# Patient Record
Sex: Female | Born: 1990 | Race: White | Hispanic: No | Smoking: Current every day smoker
Health system: Southern US, Community
[De-identification: ages and names within clinical notes are randomized; demographics above are authoritative.]

## PROBLEM LIST (undated history)

## (undated) ENCOUNTER — Inpatient Hospital Stay (HOSPITAL_COMMUNITY): Payer: Self-pay

## (undated) ENCOUNTER — Inpatient Hospital Stay (HOSPITAL_COMMUNITY): Payer: Medicaid Other

## (undated) ENCOUNTER — Emergency Department (HOSPITAL_COMMUNITY): Admission: EM | Payer: 59 | Source: Home / Self Care

## (undated) DIAGNOSIS — R109 Unspecified abdominal pain: Secondary | ICD-10-CM

## (undated) DIAGNOSIS — G43409 Hemiplegic migraine, not intractable, without status migrainosus: Secondary | ICD-10-CM

## (undated) DIAGNOSIS — R197 Diarrhea, unspecified: Secondary | ICD-10-CM

## (undated) DIAGNOSIS — D649 Anemia, unspecified: Secondary | ICD-10-CM

## (undated) DIAGNOSIS — N200 Calculus of kidney: Secondary | ICD-10-CM

## (undated) DIAGNOSIS — R519 Headache, unspecified: Secondary | ICD-10-CM

## (undated) DIAGNOSIS — F99 Mental disorder, not otherwise specified: Secondary | ICD-10-CM

## (undated) DIAGNOSIS — O2301 Infections of kidney in pregnancy, first trimester: Secondary | ICD-10-CM

## (undated) DIAGNOSIS — R51 Headache: Secondary | ICD-10-CM

## (undated) DIAGNOSIS — F419 Anxiety disorder, unspecified: Secondary | ICD-10-CM

## (undated) DIAGNOSIS — K259 Gastric ulcer, unspecified as acute or chronic, without hemorrhage or perforation: Secondary | ICD-10-CM

## (undated) HISTORY — DX: Unspecified abdominal pain: R10.9

## (undated) HISTORY — DX: Diarrhea, unspecified: R19.7

## (undated) HISTORY — DX: Anemia, unspecified: D64.9

---
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
## 2012-02-11 ENCOUNTER — Inpatient Hospital Stay (HOSPITAL_COMMUNITY)
Admission: AD | Admit: 2012-02-11 | Discharge: 2012-02-11 | Disposition: A | Discharge status: Home / Self Care | Payer: Medicaid Other | Source: Ambulatory Visit | Attending: Obstetrics and Gynecology | Admitting: Obstetrics and Gynecology

## 2012-02-11 ENCOUNTER — Encounter (HOSPITAL_COMMUNITY): Payer: Self-pay | Admitting: *Deleted

## 2012-02-11 ENCOUNTER — Inpatient Hospital Stay (HOSPITAL_COMMUNITY): Payer: Medicaid Other

## 2012-02-11 DIAGNOSIS — N390 Urinary tract infection, site not specified: Secondary | ICD-10-CM

## 2012-02-11 DIAGNOSIS — O2390 Unspecified genitourinary tract infection in pregnancy, unspecified trimester: Secondary | ICD-10-CM

## 2012-02-11 DIAGNOSIS — O4690 Antepartum hemorrhage, unspecified, unspecified trimester: Secondary | ICD-10-CM | POA: Insufficient documentation

## 2012-02-11 DIAGNOSIS — O2340 Unspecified infection of urinary tract in pregnancy, unspecified trimester: Secondary | ICD-10-CM

## 2012-02-11 LAB — URINALYSIS, ROUTINE W REFLEX MICROSCOPIC
Bilirubin Urine: NEGATIVE
Glucose, UA: NEGATIVE mg/dL
Ketones, ur: NEGATIVE mg/dL
Nitrite: POSITIVE — AB
Protein, ur: 30 mg/dL — AB
Specific Gravity, Urine: >1.03 — ABNORMAL HIGH (ref 1.005–1.030)
Urobilinogen, UA: 0.2 mg/dL (ref 0.0–1.0)
pH: 6 (ref 5.0–8.0)

## 2012-02-11 LAB — WET PREP, GENITAL
Clue Cells Wet Prep HPF POC: NONE SEEN
Trich, Wet Prep: NONE SEEN
Yeast Wet Prep HPF POC: NONE SEEN

## 2012-02-11 LAB — URINE MICROSCOPIC-ADD ON

## 2012-02-11 MED ORDER — SULFAMETHOXAZOLE-TRIMETHOPRIM 800-160 MG PO TABS: 1.0000 | ORAL_TABLET | Freq: Two times a day (BID) | ORAL | Status: AC

## 2012-02-11 NOTE — MAU Provider Note (Signed)
History     CSN: [id number]  Arrival date and time: 02/11/12 1740   First Provider Initiated Contact with Patient 02/11/12 1821      Chief Complaint  Patient presents with  . Vaginal Bleeding   HPI patient is a G1P0 at 25.4 EGA who presents with complaint of vaginal bleeding and difficulty urinating.  States she has noticed several clots today with urination ranging in size from grain of sand to half a cm. Recently had sex. States she has had some difficulty urinating for several days.  It feels as though she has to push to pee and nothing comes out, just a dribble.  She feels as though she constantly has to pee and has pressure in her pelvis.  She denies pain or burning with urination.  She also states that she has seen floaters and had an episode of blurry vision last week.  She denies HA, shortness of breath, and chest pain.  Has not had any issues with her pregnancy.  Gets prenatal care at [facility].  OB History    Grav Para Term Preterm Abortions TAB SAB Ect Mult Living   1         0      Past Medical History  Diagnosis Date  . Asthma   . Migraine   . Anxiety   . Pregnant state, incidental   . Kidney stones   . Mental disorder     huffed inhalants "affected brain"  . Pyelonephritis complicating pregnancy, antepartum, first trimester 11/05/2011    History reviewed. No pertinent past surgical history.  Family History  Problem Relation Age of Onset  . Cancer Other   . Migraines Other     History  Substance Use Topics  . Smoking status: Current Everyday Smoker -- 1.0 packs/day for 4 years    Types: Cigarettes  . Smokeless tobacco: Never Used  . Alcohol Use: No    Allergies:  Allergies  Allergen Reactions  . Codeine Other (See Comments)    Makes drowsy  . Penicillins Other (See Comments)    Was told my parents     Prescriptions prior to admission  Medication Sig Dispense Refill  . acetaminophen (TYLENOL) 500 MG tablet Take 1,000 mg by mouth as needed. For pain       . albuterol (PROVENTIL HFA;VENTOLIN HFA) 108 (90 BASE) MCG/ACT inhaler Inhale 2 puffs into the lungs every 4 (four) hours as needed. For coughing       . Prenatal Vit-Fe Fumarate-FA (PRENATAL MULTIVITAMIN) TABS Take 1 tablet by mouth daily.      . nicotine (NICODERM CQ - DOSED IN MG/24 HOURS) 21 mg/24hr patch Place 1 patch onto the skin at bedtime.        ROS negative except per HPI Physical Exam   Blood pressure 143/79, pulse 115, temperature 98.3 F (36.8 C), temperature source Oral, resp. rate 18, last menstrual period 08/16/2011.  Physical Exam  Constitutional: She is oriented to person, place, and time. She appears well-developed and well-nourished.  HENT:  Head: Normocephalic and atraumatic.  Cardiovascular: Normal rate, regular rhythm and normal heart sounds.   Respiratory: Effort normal and breath sounds normal.  GI: Soft. There is no tenderness. There is no rebound and no guarding.       No CVA tenderness  Genitourinary:       Closed and thick  Neurological: She is alert and oriented to person, place, and time.  Skin: Skin is warm and dry.  Psychiatric: She has  a normal mood and affect.   Results for orders placed during the hospital encounter of 02/11/12 (from the past 24 hour(s))  URINALYSIS, ROUTINE W REFLEX MICROSCOPIC     Status: Abnormal   Collection Time   02/11/12  5:50 PM      Component Value Range   Color, Urine YELLOW  YELLOW   APPearance HAZY (*) CLEAR   Specific Gravity, Urine >1.030 (*) 1.005 - 1.030   pH 6.0  5.0 - 8.0   Glucose, UA NEGATIVE  NEGATIVE mg/dL   Hgb urine dipstick LARGE (*) NEGATIVE   Bilirubin Urine NEGATIVE  NEGATIVE   Ketones, ur NEGATIVE  NEGATIVE mg/dL   Protein, ur 30 (*) NEGATIVE mg/dL   Urobilinogen, UA 0.2  0.0 - 1.0 mg/dL   Nitrite POSITIVE (*) NEGATIVE   Leukocytes, UA SMALL (*) NEGATIVE  URINE MICROSCOPIC-ADD ON     Status: Abnormal   Collection Time   02/11/12  5:50 PM      Component Value Range   Squamous Epithelial /  LPF RARE  RARE   WBC, UA TOO NUMEROUS TO COUNT  <3 WBC/hpf   RBC / HPF 11-20  <3 RBC/hpf   Bacteria, UA FEW (*) RARE  WET PREP, GENITAL     Status: Abnormal   Collection Time   02/11/12  7:38 PM      Component Value Range   Yeast Wet Prep HPF POC NONE SEEN  NONE SEEN   Trich, Wet Prep NONE SEEN  NONE SEEN   Clue Cells Wet Prep HPF POC NONE SEEN  NONE SEEN   WBC, Wet Prep HPF POC FEW (*) NONE SEEN   FHT: 140, moderate, accels present, decels absent, no contractions MAU Course  Procedures  Speculum exam: no bleeding, no discharge [name]: normal placentation, breech presentation  Assessment and Plan  Patient is a G1P0 at 25.4 EGA who presents for evaluation of vaginal bleeding and difficulty peeing.  UTI: will send home with prescription for bactrim. Strainer for urine in case of stone. Bleeding likely due to UTI or recent intercourse.  Will discharge home from the MAU.   Marikay Alar 02/11/2012, 7:00 PM

## 2012-02-11 NOTE — MAU Note (Signed)
Pt states she has not been able to void, states she has to void and feels pressure, but can't and she has blood when trying to urinate.

## 2012-02-12 LAB — GC/CHLAMYDIA PROBE AMP, GENITAL
Chlamydia, DNA Probe: NEGATIVE
GC Probe Amp, Genital: NEGATIVE

## 2012-02-13 NOTE — MAU Provider Note (Signed)
I have seen and examined pt and agree with above. Blood pressure was in normal range after initial measurement, which was felt to be due to pt discomfort from difficulty voiding. Ultrasound performed showing no previa and no evidence of abruption.  Napoleon Form, MD

## 2012-03-14 ENCOUNTER — Encounter (HOSPITAL_COMMUNITY): Payer: Self-pay | Admitting: *Deleted

## 2012-03-14 ENCOUNTER — Emergency Department (HOSPITAL_COMMUNITY)
Admission: EM | Admit: 2012-03-14 | Discharge: 2012-03-15 | Disposition: A | Discharge status: Home / Self Care | Payer: Medicaid Other | Attending: Emergency Medicine | Admitting: Emergency Medicine

## 2012-03-14 DIAGNOSIS — E876 Hypokalemia: Secondary | ICD-10-CM

## 2012-03-14 DIAGNOSIS — O212 Late vomiting of pregnancy: Secondary | ICD-10-CM | POA: Insufficient documentation

## 2012-03-14 DIAGNOSIS — Z3490 Encounter for supervision of normal pregnancy, unspecified, unspecified trimester: Secondary | ICD-10-CM

## 2012-03-14 DIAGNOSIS — O99330 Smoking (tobacco) complicating pregnancy, unspecified trimester: Secondary | ICD-10-CM | POA: Insufficient documentation

## 2012-03-14 DIAGNOSIS — F410 Panic disorder [episodic paroxysmal anxiety] without agoraphobia: Secondary | ICD-10-CM | POA: Insufficient documentation

## 2012-03-14 DIAGNOSIS — D649 Anemia, unspecified: Secondary | ICD-10-CM | POA: Insufficient documentation

## 2012-03-14 DIAGNOSIS — O99891 Other specified diseases and conditions complicating pregnancy: Secondary | ICD-10-CM | POA: Insufficient documentation

## 2012-03-14 DIAGNOSIS — O99019 Anemia complicating pregnancy, unspecified trimester: Secondary | ICD-10-CM | POA: Insufficient documentation

## 2012-03-14 DIAGNOSIS — J45909 Unspecified asthma, uncomplicated: Secondary | ICD-10-CM | POA: Insufficient documentation

## 2012-03-14 DIAGNOSIS — Z880 Allergy status to penicillin: Secondary | ICD-10-CM | POA: Insufficient documentation

## 2012-03-14 DIAGNOSIS — F419 Anxiety disorder, unspecified: Secondary | ICD-10-CM

## 2012-03-14 DIAGNOSIS — Z886 Allergy status to analgesic agent status: Secondary | ICD-10-CM | POA: Insufficient documentation

## 2012-03-14 LAB — CBC
HCT: 30.7 % — ABNORMAL LOW (ref 36.0–46.0)
Hemoglobin: 10.5 g/dL — ABNORMAL LOW (ref 12.0–15.0)
MCH: 32.1 pg (ref 26.0–34.0)
MCHC: 34.2 g/dL (ref 30.0–36.0)
MCV: 93.9 fL (ref 78.0–100.0)
Platelets: 180 10*3/uL (ref 150–400)
RBC: 3.27 MIL/uL — ABNORMAL LOW (ref 3.87–5.11)
RDW: 13.7 % (ref 11.5–15.5)
WBC: 11.7 10*3/uL — ABNORMAL HIGH (ref 4.0–10.5)

## 2012-03-14 LAB — BASIC METABOLIC PANEL
BUN: 7 mg/dL (ref 6–23)
CO2: 23 mEq/L (ref 19–32)
Calcium: 8.8 mg/dL (ref 8.4–10.5)
Chloride: 104 mEq/L (ref 96–112)
Creatinine, Ser: 0.6 mg/dL (ref 0.50–1.10)
GFR calc Af Amer: >90 mL/min (ref >90)
GFR calc non Af Amer: >90 mL/min (ref >90)
Glucose, Bld: 121 mg/dL — ABNORMAL HIGH (ref 70–99)
Potassium: 3.4 mEq/L — ABNORMAL LOW (ref 3.5–5.1)
Sodium: 137 mEq/L (ref 135–145)

## 2012-03-14 NOTE — ED Notes (Addendum)
Pt reporting some visual disturbances previously tonight.  Denies any at present.  Pt does report she has a history of anxiety attacks.  Pt does appear anxious and restless.  Pt reports tingling in left hand and around mouth.  Reinforced and encouraged calming techniques.  Explained to pt that hyperventilation can cause such symptoms.  Pt denies any abdominal cramping or vaginal bleeding.  Reports uncomplicated pregnancy.  Fetal heart tones assessed via doppler.  Rate 137, regular and strong. Pt reports being on Buspar for about 3 weeks and believes that the medication may be causing her symptoms.  Encouraged pt to discuss with physician about which medications to continue.

## 2012-03-14 NOTE — ED Notes (Signed)
20 min pta, had visual disturbance, Saw half of husbands face, then began tingling, Appears to be hyperventilating. Vomiting at triage.  Appears anxious.

## 2012-03-14 NOTE — ED Notes (Signed)
Pt placed on fetal monitor.  Rapid response nurse notified and pt being observed from central monitoring.

## 2012-03-14 NOTE — ED Notes (Signed)
Pt resting more comfortably at this time.  Denies pain or tingling.  No distress noted, no anxious behavior noted.

## 2012-03-14 NOTE — ED Provider Notes (Signed)
History   This chart was scribed for Tobin Chad, MD by Toya Smothers. The patient was seen in room APA01/APA01. Patient's care was started at 2025.  CSN: [id number]  Arrival date & time 03/14/12  2025   First MD Initiated Contact with Patient 03/14/12 2224      Chief Complaint  Patient presents with  . Emesis   Patient is a 21 y.o. female presenting with vomiting and migraines. The history is provided by the patient. No language interpreter was used.  Emesis  Pertinent negatives include no abdominal pain, no chills, no cough, no diarrhea, no fever and no headaches.  Migraine This is a chronic problem. The current episode started 3 to 5 hours ago. The problem occurs rarely. The problem has been gradually improving. Associated symptoms include shortness of breath. Pertinent negatives include no chest pain, no abdominal pain and no headaches. The symptoms are aggravated by exertion. She has tried nothing for the symptoms. The treatment provided no relief.    Meagan [name] is a 21 y.o. female with a h/o panic attacks and 7 m.o. pregnant who presents to the Emergency Department complaining of 1 hours of visual disturbance  Described as " black dots floating in the corners  of her eyes." and light headedness while seated. Pt denotes associated confusion and SOB during panic attack. Pt reports that she has never experienced a panic attack as long or severe. Pt denies double vision, trouble swallowing, cough, chest pain, and dysuria.  She reports active fetal movement and denies vaginal bleeding, discharges, or leakage.  Pt lists PCP as Dr. Tyrell Antonio for prenatal care.  Past Medical History  Diagnosis Date  . Asthma   . Migraine   . Anxiety   . Pregnant state, incidental   . Mental disorder     huffed inhalants "affected brain"  . Kidney stones   . Pyelonephritis complicating pregnancy, antepartum, first trimester 11/05/2011    History reviewed. No pertinent past surgical  history.  Family History  Problem Relation Age of Onset  . Cancer Other   . Migraines Other     History  Substance Use Topics  . Smoking status: Current Every Day Smoker -- 1.0 packs/day for 4 years    Types: Cigarettes  . Smokeless tobacco: Never Used  . Alcohol Use: No    OB History    Grav Para Term Preterm Abortions TAB SAB Ect Mult Living   1         0      Review of Systems  Constitutional: Negative for fever, chills, diaphoresis, activity change, appetite change and fatigue.  HENT: Negative for hearing loss, sore throat, rhinorrhea, trouble swallowing, neck pain, voice change, sinus pressure and tinnitus.   Eyes: Positive for visual disturbance. Negative for photophobia and pain.  Respiratory: Positive for shortness of breath. Negative for cough and chest tightness.   Cardiovascular: Positive for palpitations. Negative for chest pain and leg swelling.  Gastrointestinal: Positive for vomiting. Negative for nausea, abdominal pain, diarrhea and constipation.  Genitourinary: Negative for dysuria, frequency, flank pain, vaginal bleeding, vaginal discharge, difficulty urinating and vaginal pain.  Musculoskeletal: Negative for back pain and joint swelling.  Skin: Negative for color change, pallor and rash.  Neurological: Positive for light-headedness. Negative for dizziness, tremors, seizures, syncope, facial asymmetry, speech difficulty, weakness and headaches.  Hematological: Negative.   Psychiatric/Behavioral: Positive for confusion. Negative for hallucinations. The patient is nervous/anxious.   All other systems reviewed and are negative.    Allergies  Codeine and Penicillins  Home Medications   Current Outpatient Rx  Name Route Sig Dispense Refill  . ACETAMINOPHEN 500 MG PO TABS Oral Take 1,000 mg by mouth every 6 (six) hours as needed. For pain    . ALBUTEROL SULFATE HFA 108 (90 BASE) MCG/ACT IN AERS Inhalation Inhale 2 puffs into the lungs every 4 (four) hours as  needed. For coughing     . BUSPIRONE HCL 15 MG PO TABS Oral Take 7.5 mg by mouth 2 (two) times daily.    Marland Kitchen PRENATAL MULTIVITAMIN CH Oral Take 1 tablet by mouth daily.    Marland Kitchen NICOTINE 21 MG/24HR TD PT24 Transdermal Place 1 patch onto the skin at bedtime.      BP 134/63  Pulse 76  Temp 98.3 F (36.8 C) (Oral)  Resp 18  Ht 5\' 4"  (1.626 m)  SpO2 99%  LMP 08/16/2011  Physical Exam  Nursing note and vitals reviewed. Constitutional: She is oriented to person, place, and time. She appears well-developed and well-nourished. No distress. She is not intubated.  HENT:  Head: Normocephalic and atraumatic. Head is without raccoon's eyes, without Battle's sign and without contusion. No trismus in the jaw.  Right Ear: Hearing, tympanic membrane, external ear and ear canal normal. No mastoid tenderness.  Left Ear: Hearing, tympanic membrane, external ear and ear canal normal. No mastoid tenderness.  Nose: Nose normal. No mucosal edema or rhinorrhea. No epistaxis. Right sinus exhibits no maxillary sinus tenderness and no frontal sinus tenderness. Left sinus exhibits no maxillary sinus tenderness and no frontal sinus tenderness.  Mouth/Throat: Uvula is midline and oropharynx is clear and moist. Mucous membranes are not pale, not dry and not cyanotic. Normal dentition. No uvula swelling or dental caries. No oropharyngeal exudate, posterior oropharyngeal edema, posterior oropharyngeal erythema or tonsillar abscesses.  Eyes: Conjunctivae normal, EOM and lids are normal. Pupils are equal, round, and reactive to light. Right eye exhibits no discharge. Left eye exhibits no discharge. Right conjunctiva is not injected. Right conjunctiva has no hemorrhage. Left conjunctiva is not injected. Left conjunctiva has no hemorrhage. No scleral icterus. Right eye exhibits normal extraocular motion and no nystagmus. Left eye exhibits normal extraocular motion and no nystagmus. Right pupil is round and reactive. Left pupil is round  and reactive. Pupils are equal.  Fundoscopic exam:      The right eye shows no AV nicking, no hemorrhage and no papilledema.       The left eye shows no AV nicking, no hemorrhage and no papilledema.  Neck: Normal range of motion. Neck supple. No JVD present. No tracheal deviation present. No thyromegaly present.  Cardiovascular: Normal rate, regular rhythm, normal heart sounds and intact distal pulses.  Exam reveals no gallop and no friction rub.   No murmur heard. Pulmonary/Chest: Effort normal and breath sounds normal. No accessory muscle usage or stridor. No apnea, not tachypneic and not bradypneic. She is not intubated. No respiratory distress. She has no decreased breath sounds. She has no wheezes. She has no rhonchi. She has no rales. She exhibits no mass, no tenderness, no deformity and no swelling.  Abdominal: Soft. Bowel sounds are normal. She exhibits no mass. There is no tenderness. There is no rebound and no guarding.       Gravid abd, + fetal mvmnt  Musculoskeletal: Normal range of motion. She exhibits no edema and no tenderness.  Lymphadenopathy:    She has no cervical adenopathy.  Neurological: She is alert and oriented to person, place, and  time. She has normal strength. She is not disoriented. She displays no atrophy, no tremor and normal reflexes. No cranial nerve deficit or sensory deficit. She exhibits normal muscle tone. She displays no seizure activity. Coordination and gait normal. GCS eye subscore is 4. GCS verbal subscore is 5. GCS motor subscore is 6. She displays no Babinski's sign on the right side. She displays no Babinski's sign on the left side.  Skin: Skin is warm and dry. No rash noted. She is not diaphoretic. No erythema. No pallor.  Psychiatric: Her behavior is normal. Her mood appears anxious. Her speech is not delayed, not tangential and not slurred. Cognition and memory are normal. Cognition and memory are not impaired. She is communicative. She exhibits normal  recent memory and normal remote memory.    ED Course  Procedures (including critical care time) DIAGNOSTIC STUDIES: Oxygen Saturation is 99% on room air, normal by my interpretation.    COORDINATION OF CARE: 22:53- Evaluated Pt. Pt is awake, alert, and oriented.   Labs Reviewed - No data to display No results found.   No diagnosis found.    MDM  Pt presents for evaluation of a visual disturbance associated with a panic attack.  She is 7 mo pregnant but denies any ob/gyn complaints.  Pt has no evidence of CVA or meningitis on exam.  Plan routine screening including basic labs, visual acuity, and fetal monitoring.  1610.  Pt stable, NAD.  Note mild anemia on labs.  Likely secondary to pregnancy.  Pt has no evidence of symptomatic anemia on exam.  Pt has mild hypokalemia.  Will replete with po potassium.  Plan d/c home to f/u closely with her PMD and Ob provider.  There was no noted fetal distress while under ER department care.  Pt has no focal deficits on exam.  I personally performed the services described in this documentation, which was scribed in my presence. The recorded information has been reviewed and considered.        Tobin Chad, MD 03/15/12 [phone]

## 2012-03-15 LAB — URINALYSIS, ROUTINE W REFLEX MICROSCOPIC
Bilirubin Urine: NEGATIVE
Glucose, UA: NEGATIVE mg/dL
Hgb urine dipstick: NEGATIVE
Ketones, ur: NEGATIVE mg/dL
Leukocytes, UA: NEGATIVE
Nitrite: NEGATIVE
Protein, ur: NEGATIVE mg/dL
Specific Gravity, Urine: 1.01 (ref 1.005–1.030)
Urobilinogen, UA: 0.2 mg/dL (ref 0.0–1.0)
pH: 6.5 (ref 5.0–8.0)

## 2012-03-15 MED ORDER — POTASSIUM CHLORIDE CRYS ER 20 MEQ PO TBCR
40.0000 meq | EXTENDED_RELEASE_TABLET | Freq: Once | ORAL | Status: AC
  Administered 2012-03-15: 40 meq via ORAL
  Filled 2012-03-15: qty 2

## 2012-04-03 ENCOUNTER — Inpatient Hospital Stay (HOSPITAL_COMMUNITY)
Admission: AD | Admit: 2012-04-03 | Discharge: 2012-04-03 | Disposition: A | Discharge status: Home / Self Care | Payer: Medicaid Other | Source: Ambulatory Visit | Attending: Obstetrics & Gynecology | Admitting: Obstetrics & Gynecology

## 2012-04-03 ENCOUNTER — Encounter (HOSPITAL_COMMUNITY): Payer: Self-pay | Admitting: *Deleted

## 2012-04-03 DIAGNOSIS — R109 Unspecified abdominal pain: Secondary | ICD-10-CM | POA: Insufficient documentation

## 2012-04-03 DIAGNOSIS — O479 False labor, unspecified: Secondary | ICD-10-CM

## 2012-04-03 DIAGNOSIS — O4700 False labor before 37 completed weeks of gestation, unspecified trimester: Secondary | ICD-10-CM | POA: Insufficient documentation

## 2012-04-03 DIAGNOSIS — M549 Dorsalgia, unspecified: Secondary | ICD-10-CM | POA: Insufficient documentation

## 2012-04-03 DIAGNOSIS — A5901 Trichomonal vulvovaginitis: Secondary | ICD-10-CM | POA: Insufficient documentation

## 2012-04-03 DIAGNOSIS — O98819 Other maternal infectious and parasitic diseases complicating pregnancy, unspecified trimester: Secondary | ICD-10-CM | POA: Insufficient documentation

## 2012-04-03 LAB — URINE MICROSCOPIC-ADD ON

## 2012-04-03 LAB — URINALYSIS, ROUTINE W REFLEX MICROSCOPIC
Bilirubin Urine: NEGATIVE
Bilirubin Urine: NEGATIVE
Glucose, UA: NEGATIVE mg/dL
Glucose, UA: NEGATIVE mg/dL
Hgb urine dipstick: NEGATIVE
Hgb urine dipstick: NEGATIVE
Ketones, ur: NEGATIVE mg/dL
Ketones, ur: NEGATIVE mg/dL
Nitrite: NEGATIVE
Nitrite: NEGATIVE
Protein, ur: NEGATIVE mg/dL
Protein, ur: NEGATIVE mg/dL
Specific Gravity, Urine: 1.02 (ref 1.005–1.030)
Specific Gravity, Urine: 1.01 (ref 1.005–1.030)
Urobilinogen, UA: 0.2 mg/dL (ref 0.0–1.0)
Urobilinogen, UA: 0.2 mg/dL (ref 0.0–1.0)
pH: 6.5 (ref 5.0–8.0)
pH: 6 (ref 5.0–8.0)

## 2012-04-03 LAB — WET PREP, GENITAL
Clue Cells Wet Prep HPF POC: NONE SEEN
Trich, Wet Prep: NONE SEEN
Yeast Wet Prep HPF POC: NONE SEEN

## 2012-04-03 LAB — FETAL FIBRONECTIN: Fetal Fibronectin: NEGATIVE

## 2012-04-03 NOTE — MAU Provider Note (Signed)
History     CSN: [id number]  Arrival date and time: 04/03/12 9629   First Provider Initiated Contact with Patient 04/03/12 2057      Chief Complaint  Patient presents with  . Back Pain   HPI This is a 21 y.o. female at [redacted]w[redacted]d who presents with low abdominal and back pain. Denies leaking or bleeding and reports + FM Care at [facility]  OB History    Grav Para Term Preterm Abortions TAB SAB Ect Mult Living   1         0      Past Medical History  Diagnosis Date  . Asthma   . Migraine   . Anxiety   . Pregnant state, incidental   . Mental disorder     huffed inhalants "affected brain"  . Kidney stones   . Pyelonephritis complicating pregnancy, antepartum, first trimester 11/05/2011    Past Surgical History  Procedure Date  . No past surgeries     Family History  Problem Relation Age of Onset  . Cancer Other   . Migraines Other     History  Substance Use Topics  . Smoking status: Former Smoker -- 1.0 packs/day for 4 years    Types: Cigarettes    Quit date: 12/03/2011  . Smokeless tobacco: Never Used  . Alcohol Use: No    Allergies:  Allergies  Allergen Reactions  . Codeine Other (See Comments)    Makes drowsy  . Penicillins Other (See Comments)    Was told my parents     Prescriptions prior to admission  Medication Sig Dispense Refill  . acetaminophen (TYLENOL) 500 MG tablet Take 1,000 mg by mouth every 6 (six) hours as needed. For pain      . albuterol (PROVENTIL HFA;VENTOLIN HFA) 108 (90 BASE) MCG/ACT inhaler Inhale 2 puffs into the lungs every 4 (four) hours as needed. For coughing      . ALPRAZolam (XANAX) 0.25 MG tablet Take 0.25 mg by mouth 2 (two) times daily as needed. For anxiety      . Prenatal Vit-Fe Fumarate-FA (PRENATAL MULTIVITAMIN) TABS Take 1 tablet by mouth daily.      . nicotine (NICODERM CQ - DOSED IN MG/24 HOURS) 21 mg/24hr patch Place 1 patch onto the skin at bedtime.        ROS Seen HPI  Physical Exam   Blood pressure  127/84, pulse 108, temperature 98.2 F (36.8 C), temperature source Oral, resp. rate 20, height 5\' 2"  (1.575 m), weight 173 lb (78.472 kg), last menstrual period 08/16/2011, SpO2 100.00%.  Physical Exam  Constitutional: She is oriented to person, place, and time. She appears well-developed and well-nourished. No distress.  Cardiovascular: Normal rate.   Respiratory: Effort normal.  GI: Soft. She exhibits no distension. There is no tenderness. There is no rebound and no guarding.  Genitourinary: Uterus normal. Vaginal discharge (scant white) found.       Cervix long and closed FFn collected  Musculoskeletal: Normal range of motion.  Neurological: She is alert and oriented to person, place, and time.  Skin: Skin is warm and dry.  Psychiatric: She has a normal mood and affect.   FHR reactive UCs q 2-5 min, mild  MAU Course  Procedures  MDM UA showed Trich. Pt thinks it was mixed up. Wet prep is negative for Trich. Wants to recollect Urine.  Assessment and Plan  Plan per Results Care turned over to [employer] CNM  [facility] 04/03/2012, 10:05 PM  Rev'd second UA with pt (no trich seen)- recommended pt take medication for tx but she and partner refuse.  Instructed that she may indeed have a trichomonas infection and may have complications from such in the future- still declines tx.   FHR 130s reactive & reassuring Ctx irreg, mild  Urinalysis (second sample)    Component Value Date/Time   COLORURINE YELLOW 04/03/2012 2155   APPEARANCEUR CLEAR 04/03/2012 2155   LABSPEC 1.010 04/03/2012 2155   PHURINE 6.0 04/03/2012 2155   GLUCOSEU NEGATIVE 04/03/2012 2155   HGBUR NEGATIVE 04/03/2012 2155   BILIRUBINUR NEGATIVE 04/03/2012 2155   KETONESUR NEGATIVE 04/03/2012 2155   PROTEINUR NEGATIVE 04/03/2012 2155   UROBILINOGEN 0.2 04/03/2012 2155   NITRITE NEGATIVE 04/03/2012 2155   LEUKOCYTESUR SMALL* 04/03/2012 2155    IUP at 33.0wks Braxton Hicks ctx Indeterminate trichomonas- declines  tx  D/C home with preterm labor precautions Keep next scheduled FT appt or return to [facility] sooner prn  Brooklyn Alfredo, [facility] 04/03/2012 11:15 PM

## 2012-04-03 NOTE — MAU Note (Signed)
Pt reports "really bad pain in my back"pain is mostly in left flank pain, pain in stomach that comes and goes. Denies dysuria. Denies problems with pregnancy

## 2012-04-13 ENCOUNTER — Encounter (HOSPITAL_COMMUNITY): Payer: Self-pay | Admitting: *Deleted

## 2012-04-13 ENCOUNTER — Inpatient Hospital Stay (HOSPITAL_COMMUNITY)
Admission: AD | Admit: 2012-04-13 | Discharge: 2012-04-13 | Disposition: A | Discharge status: Home / Self Care | Payer: Medicaid Other | Source: Ambulatory Visit | Attending: Obstetrics and Gynecology | Admitting: Obstetrics and Gynecology

## 2012-04-13 DIAGNOSIS — A599 Trichomoniasis, unspecified: Secondary | ICD-10-CM

## 2012-04-13 DIAGNOSIS — O4700 False labor before 37 completed weeks of gestation, unspecified trimester: Secondary | ICD-10-CM | POA: Insufficient documentation

## 2012-04-13 LAB — AMNISURE RUPTURE OF MEMBRANE (ROM) NOT AT ARMC: Amnisure ROM: NEGATIVE

## 2012-04-13 MED ORDER — METRONIDAZOLE 500 MG PO TABS: 2000.0000 mg | ORAL_TABLET | Freq: Once | ORAL | Status: DC

## 2012-04-13 NOTE — MAU Note (Signed)
Patient will be triaged in room 173. Belenda Cruise, CN notified of patient status.

## 2012-04-13 NOTE — Progress Notes (Signed)
Off FM per Ivonne Andrew CNM, Provider reviewed strip.

## 2012-04-13 NOTE — Progress Notes (Signed)
Scant fluid noted, fern obtained, NEG result

## 2012-04-13 NOTE — Progress Notes (Signed)
Pt given DC instructions and SO and PT verbalized understanding. Discussed with pt and SO previous result of trace Trich in Urine and recommendation to be treated per CNM. Possible reason for vaginal discharge. Pt refused to be treated since she  "knows she does not have an STD". Offered to have CNM/MD to come in and talk with them but patient declined.  Encouraged Pt to discuss her concerns with Dr. at next appointment 04/16/12.

## 2012-04-13 NOTE — Discharge Summary (Signed)
Patient triaged in labor and delivery. See MAU note.  [place], CNM 04/13/2012 6:12 PM

## 2012-04-13 NOTE — Progress Notes (Addendum)
Patient ID: Meagan [name], female   DOB: 1991-06-28, 21 y.o.   MRN: [id number] Chief Complaint:  Rupture of Membranes and Labor Eval  First provider contact 04/13/12 at 1700.  HPI: Meagan [name] is a 21 y.o. G2P0010 at 77w3dwho presents to maternity admissions reporting one gush of clear fluid at 1400 that ran down her leg and irregular contractions. Denies vaginal bleeding. Good fetal movement. Diagnosed with Trichomonas in maternity admissions visit 10 days ago. Patient stated she did not think she had it. She and her partner are refused treatment.  Past Medical History: Past Medical History  Diagnosis Date  . Asthma   . Migraine   . Anxiety   . Pregnant state, incidental   . Mental disorder     huffed inhalants "affected brain"  . Kidney stones   . Pyelonephritis complicating pregnancy, antepartum, first trimester 11/05/2011    Past obstetric history: OB History    Grav Para Term Preterm Abortions TAB SAB Ect Mult Living   2 0 0 0 1 0 1 0 0 0      # Outc Date GA Lbr Len/2nd Wgt Sex Del Anes PTL Lv   1 SAB            2 CUR               Past Surgical History: Past Surgical History  Procedure Date  . No past surgeries     Family History: Family History  Problem Relation Age of Onset  . Cancer Other   . Migraines Other     Social History: History  Substance Use Topics  . Smoking status: Former Smoker -- 1.0 packs/day for 4 years    Types: Cigarettes    Quit date: 12/03/2011  . Smokeless tobacco: Never Used  . Alcohol Use: No    Allergies:  Allergies  Allergen Reactions  . Codeine Nausea And Vomiting    Ataxia/vertigo  . Penicillins Other (See Comments)    Reaction as a small child, unknown    Meds:  Prescriptions prior to admission  Medication Sig Dispense Refill  . acetaminophen (TYLENOL) 500 MG tablet Take 1,000 mg by mouth every 6 (six) hours as needed. For pain      . albuterol (PROVENTIL HFA;VENTOLIN HFA) 108 (90 BASE) MCG/ACT inhaler  Inhale 2 puffs into the lungs every 4 (four) hours as needed. For coughing      . ALPRAZolam (XANAX) 0.25 MG tablet Take 0.25 mg by mouth 2 (two) times daily as needed. For anxiety      . cephALEXin (KEFLEX) 500 MG capsule Take 500 mg by mouth at bedtime. Take through delivery      . Prenatal Vit-Fe Fumarate-FA (PRENATAL MULTIVITAMIN) TABS Take 1 tablet by mouth daily.        ROS: Pertinent findings in history of present illness.  Physical Exam  Blood pressure 127/77, pulse 106, temperature 98.4 F (36.9 C), temperature source Oral, resp. rate 18, height 5\' 2"  (1.575 m), weight 80.287 kg (177 lb), last menstrual period 08/16/2011, SpO2 100.00%. GENERAL: Well-developed, well-nourished female in no acute distress.  HEENT: normocephalic HEART: normal rate RESP: normal effort ABDOMEN: Soft, non-tender, gravid appropriate for gestational age EXTREMITIES: Nontender, no edema NEURO: alert and oriented SPECULUM EXAM: NEFG, moderate amount of creamy whitish yellow discharge mixed with mucus, mildly malodorous. No blood. Negative pooling.    FHT:  Baseline 140 , moderate variability, accelerations present, no decelerations Contractions: Irregular, mild   Labs: Results for orders placed during the  hospital encounter of 04/13/12 (from the past 24 hour(s))  AMNISURE RUPTURE OF MEMBRANE (ROM)     Status: Normal   Collection Time   04/13/12  5:00 PM      Component Value Range   Amnisure ROM NEGATIVE     Fern negative x2 Imaging:  No results found. ED Course  Assessment: Intact membranes. Vaginal discharge in pregnancy, likely Trichomonas.  Plan: Discharge home Preterm labor precautions and fetal kick counts Offered treatment for Trichomonas again. Prescription given. GBS culture with sensitivities pending. Follow-up Information    Follow up with [facility]. (As scheduled)    Contact information:   [address] [place] [address] [phone]      Follow up  with [facility] [place] [facility]. (As needed if symptoms worsen)    Contact information:   [address] [id number] mc [place] [address] [phone]           Medication List     As of 04/13/2012  6:11 PM    TAKE these medications         acetaminophen 500 MG tablet   Commonly known as: TYLENOL   Take 1,000 mg by mouth every 6 (six) hours as needed. For pain      albuterol 108 (90 BASE) MCG/ACT inhaler   Commonly known as: PROVENTIL HFA;VENTOLIN HFA   Inhale 2 puffs into the lungs every 4 (four) hours as needed. For coughing      ALPRAZolam 0.25 MG tablet   Commonly known as: XANAX   Take 0.25 mg by mouth 2 (two) times daily as needed. For anxiety      cephALEXin 500 MG capsule   Commonly known as: KEFLEX   Take 500 mg by mouth at bedtime. Take through delivery      metroNIDAZOLE 500 MG tablet   Commonly known as: FLAGYL   Take 4 tablets (2,000 mg total) by mouth once.      prenatal multivitamin Tabs   Take 1 tablet by mouth daily.         Meagan [name], CNM 04/13/2012 6:00 PM

## 2012-04-13 NOTE — Discharge Summary (Signed)
Attestation of Attending Supervision of Advanced Practitioner: Evaluation and management procedures were performed by the PA/NP/CNM/OB Fellow under my supervision/collaboration. Chart reviewed and agree with management and plan.  Maikol Grassia V 04/13/2012 9:13 PM

## 2012-04-13 NOTE — MAU Note (Signed)
Patient states she had a gush of clear fluid at 1400 that ran down her leg. Only happened once and is not wearing a pad. Reports irregular contractions. Reports good fetal movement.

## 2012-04-13 NOTE — Progress Notes (Signed)
Spoke with [name] CNM, will do fern

## 2012-04-16 LAB — CULTURE, BETA STREP (GROUP B ONLY)

## 2012-04-27 ENCOUNTER — Inpatient Hospital Stay (HOSPITAL_COMMUNITY)
Admission: AD | Admit: 2012-04-27 | Discharge: 2012-04-27 | Disposition: A | Discharge status: Home / Self Care | Payer: Medicaid Other | Source: Ambulatory Visit | Attending: Obstetrics & Gynecology | Admitting: Obstetrics & Gynecology

## 2012-04-27 ENCOUNTER — Encounter (HOSPITAL_COMMUNITY): Payer: Self-pay | Admitting: *Deleted

## 2012-04-27 DIAGNOSIS — O2300 Infections of kidney in pregnancy, unspecified trimester: Secondary | ICD-10-CM

## 2012-04-27 DIAGNOSIS — O99891 Other specified diseases and conditions complicating pregnancy: Secondary | ICD-10-CM | POA: Insufficient documentation

## 2012-04-27 DIAGNOSIS — R1012 Left upper quadrant pain: Secondary | ICD-10-CM | POA: Insufficient documentation

## 2012-04-27 DIAGNOSIS — O210 Mild hyperemesis gravidarum: Secondary | ICD-10-CM | POA: Insufficient documentation

## 2012-04-27 LAB — URINALYSIS, ROUTINE W REFLEX MICROSCOPIC
Bilirubin Urine: NEGATIVE
Glucose, UA: NEGATIVE mg/dL
Hgb urine dipstick: NEGATIVE
Ketones, ur: NEGATIVE mg/dL
Nitrite: NEGATIVE
Protein, ur: NEGATIVE mg/dL
Specific Gravity, Urine: 1.02 (ref 1.005–1.030)
Urobilinogen, UA: 1 mg/dL (ref 0.0–1.0)
pH: 6.5 (ref 5.0–8.0)

## 2012-04-27 LAB — URINE MICROSCOPIC-ADD ON

## 2012-04-27 MED ORDER — LACTATED RINGERS IV BOLUS (SEPSIS): 1000.0000 mL | Freq: Once | INTRAVENOUS | Status: DC

## 2012-04-27 MED ORDER — HYDROXYZINE HCL 50 MG PO TABS
50.0000 mg | ORAL_TABLET | Freq: Once | ORAL | Status: DC
  Filled 2012-04-27: qty 1

## 2012-04-27 MED ORDER — ONDANSETRON HCL 4 MG PO TABS: 4.0000 mg | ORAL_TABLET | Freq: Once | ORAL | Status: DC

## 2012-04-27 MED ORDER — HYDROXYZINE HCL 50 MG/ML IM SOLN: 50.0000 mg | Freq: Four times a day (QID) | INTRAMUSCULAR | Status: DC | PRN

## 2012-04-27 NOTE — MAU Note (Signed)
Dr. Claiborne Billings in pt room discussing treatment. Pt still refuses and states she is ready to go home.

## 2012-04-27 NOTE — MAU Note (Signed)
Pt refused to have IV started or to have labs drawn. Dr. Claiborne Billings talked to pt about the consequences of refusing treatment. Pt verbalized understanding.

## 2012-04-27 NOTE — MAU Note (Signed)
Pt presents for abdominal pain/cramping that started this morning accompanied by nausea.  Denies any LOF or bleeding.  Reports good fetal movement.

## 2012-04-27 NOTE — MAU Provider Note (Signed)
History     CSN: [id number]  Arrival date and time: 04/27/12 2021   Chief Complaint  Patient presents with  . Abdominal Cramping  . Nausea   HPI Meagan [name] 21 y.o. G1P0000 [redacted]w[redacted]d presented to the MAU this evening for abdominal pain for 1 hour. She describes the pain more as cramping in her lower abdomen with occasional LUQ pain. She has a history of pyelonephritis during her pregnancy and is on keflex through delivery. She has a history of panic attacks and currently is starting to experience one since she has arrived. She denies vomit, diarrhea, headache, rash or visual changes.   Past Medical History  Diagnosis Date  . Asthma   . Migraine   . Anxiety   . Pregnant state, incidental   . Mental disorder     huffed inhalants "affected brain"  . Kidney stones   . Pyelonephritis complicating pregnancy, antepartum, first trimester 11/05/2011    Past Surgical History  Procedure Date  . No past surgeries     Family History  Problem Relation Age of Onset  . Cancer Other   . Migraines Other     History  Substance Use Topics  . Smoking status: Former Smoker -- 1.0 packs/day for 4 years    Types: Cigarettes    Quit date: 12/03/2011  . Smokeless tobacco: Never Used  . Alcohol Use: No    Allergies:  Allergies  Allergen Reactions  . Codeine Nausea And Vomiting    Ataxia/vertigo  . Penicillins Other (See Comments)    Reaction as a small child, unknown    Prescriptions prior to admission  Medication Sig Dispense Refill  . acetaminophen (TYLENOL) 500 MG tablet Take 1,000 mg by mouth every 6 (six) hours as needed. For pain      . albuterol (PROVENTIL HFA;VENTOLIN HFA) 108 (90 BASE) MCG/ACT inhaler Inhale 2 puffs into the lungs every 4 (four) hours as needed. For coughing      . ALPRAZolam (XANAX) 0.25 MG tablet Take 0.125-0.25 mg by mouth 2 (two) times daily as needed. For anxiety      . cephALEXin (KEFLEX) 500 MG capsule Take 500 mg by mouth at bedtime. Take  through delivery      . Prenatal Vit-Fe Fumarate-FA (PRENATAL MULTIVITAMIN) TABS Take 1 tablet by mouth daily.      Marland Kitchen DISCONTD: metroNIDAZOLE (FLAGYL) 500 MG tablet Take 4 tablets (2,000 mg total) by mouth once.  4 tablet  0    Review of Systems  Constitutional: Negative for fever and chills.  Eyes: Negative for blurred vision and double vision.  Respiratory: Positive for shortness of breath. Negative for cough.        Anxiety caused SOB  Cardiovascular: Negative for chest pain and palpitations.  Gastrointestinal: Positive for nausea and abdominal pain. Negative for vomiting, diarrhea and constipation.  Genitourinary: Negative for dysuria, urgency, frequency, hematuria and flank pain.  Musculoskeletal: Negative for back pain.  Neurological: Negative for dizziness, weakness and headaches.  Psychiatric/Behavioral: The patient is nervous/anxious.    Physical Exam   Blood pressure 114/60, pulse 101, temperature 98.3 F (36.8 C), temperature source Oral, resp. rate 20, height 5\' 2"  (1.575 m), weight 79.833 kg (176 lb), last menstrual period 08/16/2011.  Physical Exam  Constitutional: She is oriented to person, place, and time. She appears well-developed and well-nourished. No distress.  HENT:  Head: Normocephalic.  Eyes: Conjunctivae normal are normal. Pupils are equal, round, and reactive to light. Right eye exhibits no discharge. Left eye  exhibits no discharge. No scleral icterus.  Cardiovascular: Normal rate, regular rhythm and normal heart sounds.   No murmur heard. Respiratory: Effort normal and breath sounds normal. No respiratory distress. She has no wheezes. She has no rales.  GI: Soft. Bowel sounds are normal. She exhibits no distension and no mass. There is no tenderness. There is no rebound and no guarding.       Displayed no tenderness with palpation Gravid   Musculoskeletal: She exhibits no edema and no tenderness.       No CVA tenderness  Neurological: She is alert and  oriented to person, place, and time.  Skin: Skin is warm and dry. She is not diaphoretic.  Psychiatric: Her mood appears anxious. She expresses inappropriate judgment.       Anxious, Psychomotor agitation. Flushed.    Dilation: Closed Effacement (%): Thick Cervical Position: Posterior Station: -3 Exam by:: Dr. Claiborne Billings  Re-exam of cervix unchanged EFM: FHR 140's baseline. Cat 2 tracing d/t frequent areas of marked variability accelerations present, no decelerations present. Contractions irregular 4-73m. MAU Course  Procedures 1. EFM 2. UA  Assessment and Plan  1. Anxiety: Vistaril offered --> refused 2. Abdominal Pain:        -Probable uterine irritibility, possible kidney/bladder infection or infection of other etiology--> refused         all blood work      - refused IV bolus for possible dehydration causes 3. Nausea: Zofran offered --> refused 4. Discharge home:  - Spoke with the patient in depth about the consequences of her not letting intervention or treatment take place. Informed her ultimately it is her choice, but her current choice is not recommended. She was informed she could be putting herself and her baby in harm if she would have an infection or was going into labor. She stated she understood. She and her partner were offered the chance for any questions, neither of them had questions and explained they understood. Explained her baby HR is what we refer to as "marked variability" and it is normally something we would like to correct or observe. She agreed to have her cervix checked a 2nd time for possible changes, but no other intervention. Documented by nurse as well.  - home with labor precautions, take all her medications and encourage PO fluids  Kuneff, Renee 04/27/2012, 9:10 PM

## 2012-04-29 LAB — OB RESULTS CONSOLE GBS: GBS: NEGATIVE

## 2012-05-14 ENCOUNTER — Encounter (HOSPITAL_COMMUNITY): Payer: Self-pay | Admitting: *Deleted

## 2012-05-14 ENCOUNTER — Inpatient Hospital Stay (HOSPITAL_COMMUNITY)
Admission: AD | Admit: 2012-05-14 | Discharge: 2012-05-14 | Disposition: A | Discharge status: Home / Self Care | Payer: Medicaid Other | Source: Ambulatory Visit | Attending: Obstetrics and Gynecology | Admitting: Obstetrics and Gynecology

## 2012-05-14 ENCOUNTER — Inpatient Hospital Stay (HOSPITAL_COMMUNITY): Payer: Medicaid Other

## 2012-05-14 DIAGNOSIS — R4182 Altered mental status, unspecified: Secondary | ICD-10-CM | POA: Insufficient documentation

## 2012-05-14 DIAGNOSIS — R5383 Other fatigue: Secondary | ICD-10-CM | POA: Insufficient documentation

## 2012-05-14 DIAGNOSIS — R5381 Other malaise: Secondary | ICD-10-CM | POA: Insufficient documentation

## 2012-05-14 DIAGNOSIS — F449 Dissociative and conversion disorder, unspecified: Secondary | ICD-10-CM

## 2012-05-14 DIAGNOSIS — R209 Unspecified disturbances of skin sensation: Secondary | ICD-10-CM | POA: Insufficient documentation

## 2012-05-14 DIAGNOSIS — O99891 Other specified diseases and conditions complicating pregnancy: Secondary | ICD-10-CM | POA: Insufficient documentation

## 2012-05-14 LAB — URINALYSIS, ROUTINE W REFLEX MICROSCOPIC
Bilirubin Urine: NEGATIVE
Glucose, UA: NEGATIVE mg/dL
Ketones, ur: NEGATIVE mg/dL
Nitrite: NEGATIVE
Protein, ur: NEGATIVE mg/dL
Specific Gravity, Urine: 1.02 (ref 1.005–1.030)
Urobilinogen, UA: 1 mg/dL (ref 0.0–1.0)
pH: 7 (ref 5.0–8.0)

## 2012-05-14 LAB — URINE MICROSCOPIC-ADD ON

## 2012-05-14 LAB — CBC
HCT: 34 % — ABNORMAL LOW (ref 36.0–46.0)
Hemoglobin: 11.3 g/dL — ABNORMAL LOW (ref 12.0–15.0)
MCH: 31 pg (ref 26.0–34.0)
MCHC: 33.2 g/dL (ref 30.0–36.0)
MCV: 93.4 fL (ref 78.0–100.0)
Platelets: 169 10*3/uL (ref 150–400)
RBC: 3.64 MIL/uL — ABNORMAL LOW (ref 3.87–5.11)
RDW: 13.2 % (ref 11.5–15.5)
WBC: 9.8 10*3/uL (ref 4.0–10.5)

## 2012-05-14 LAB — COMPREHENSIVE METABOLIC PANEL
ALT: 10 U/L (ref 0–35)
AST: 8 U/L (ref 0–37)
Albumin: 2.3 g/dL — ABNORMAL LOW (ref 3.5–5.2)
Alkaline Phosphatase: 114 U/L (ref 39–117)
BUN: 8 mg/dL (ref 6–23)
CO2: 23 mEq/L (ref 19–32)
Calcium: 8.7 mg/dL (ref 8.4–10.5)
Chloride: 104 mEq/L (ref 96–112)
Creatinine, Ser: 0.62 mg/dL (ref 0.50–1.10)
GFR calc Af Amer: >90 mL/min (ref >90)
GFR calc non Af Amer: >90 mL/min (ref >90)
Glucose, Bld: 77 mg/dL (ref 70–99)
Potassium: 3.9 mEq/L (ref 3.5–5.1)
Sodium: 137 mEq/L (ref 135–145)
Total Bilirubin: 0.2 mg/dL — ABNORMAL LOW (ref 0.3–1.2)
Total Protein: 5.3 g/dL — ABNORMAL LOW (ref 6.0–8.3)

## 2012-05-14 NOTE — MAU Provider Note (Signed)
History     CSN: [id number]  Arrival date and time: 05/14/12 1249   First Provider Initiated Contact with Patient 05/14/12 1436      Chief Complaint  Patient presents with  . Numbness  . Altered Mental Status   HPI Pt is 21 yo G1 at [redacted]w[redacted]d presenting to MAU to be evaluated for right sided weakness. Patient states last night she states her tongue felt like "it wouldn't move" and she had difficulty speaking around 4:00am. She then noticed she was having numbness on the entire right side of her body (face, arm and leg). She went to bed, but when she got up this morning, she noticed weakness on her right side. Sensation had returned with no focal sensory deficit. Patient reports some confusion; difficulty remember things or forgets what she is saying in the middle of her sentence for the last week. Reports never having this before. She is a patient at [facility]. She states she has had some elevated BP in the office, but not treated for PIH. Patient denies any drug or alcohol use. She has a history of panic attacks and takes Xanax but does not report any anxiety associated with event.  Good fetal movement. No bleeding, no gush of fluid. No regular contractions. She does have some cramping sensation and back pain.   OB History    Grav Para Term Preterm Abortions TAB SAB Ect Mult Living   1 0 0 0 0 0 0 0 0 0       Past Medical History  Diagnosis Date  . Asthma   . Migraine   . Anxiety   . Pregnant state, incidental   . Mental disorder     huffed inhalants "affected brain"  . Kidney stones   . Pyelonephritis complicating pregnancy, antepartum, first trimester 11/05/2011    Past Surgical History  Procedure Date  . No past surgeries     Family History  Problem Relation Age of Onset  . Cancer Other   . Migraines Other     History  Substance Use Topics  . Smoking status: Former Smoker -- 1.0 packs/day for 4 years    Types: Cigarettes    Quit date: 12/03/2011  . Smokeless  tobacco: Never Used  . Alcohol Use: No    Allergies:  Allergies  Allergen Reactions  . Codeine Nausea And Vomiting    Ataxia/vertigo  . Penicillins Other (See Comments)    Reaction as a small child, unknown    Prescriptions prior to admission  Medication Sig Dispense Refill  . albuterol (PROVENTIL HFA;VENTOLIN HFA) 108 (90 BASE) MCG/ACT inhaler Inhale 2 puffs into the lungs every 4 (four) hours as needed. For coughing      . ALPRAZolam (XANAX) 0.5 MG tablet Take 0.5 mg by mouth 2 (two) times daily as needed. anxiety      . cephALEXin (KEFLEX) 500 MG capsule Take 500 mg by mouth at bedtime. Take through delivery      . Prenatal Vit-Fe Fumarate-FA (PRENATAL MULTIVITAMIN) TABS Take 1 tablet by mouth daily.        Review of Systems  Constitutional: Negative for fever and chills.  Respiratory: Negative for shortness of breath.   Cardiovascular: Negative for chest pain.  Gastrointestinal: Negative for nausea and vomiting.  Genitourinary: Negative for dysuria.  Musculoskeletal: Positive for back pain.  Skin: Negative for rash.  Neurological: Positive for focal weakness. Negative for dizziness, tingling, tremors, sensory change, seizures and headaches.   Physical Exam  Blood pressure 124/67, pulse 104, temperature 97.6 F (36.4 C), temperature source Oral, resp. rate 16, height 5\' 2"  (1.575 m), weight 79.833 kg (176 lb), last menstrual period 08/16/2011.  Physical Exam  Constitutional: She is oriented to person, place, and time. She appears well-developed and well-nourished. No distress.  HENT:  Head: Normocephalic and atraumatic.  Neck: Normal range of motion.  Cardiovascular: Normal rate and regular rhythm.   No murmur heard. Respiratory: Effort normal and breath sounds normal. She has no wheezes.  GI: Soft. There is no tenderness.       Gravid. Toco in place.  Musculoskeletal: Normal range of motion. She exhibits no edema and no tenderness.  Lymphadenopathy:    She has no  cervical adenopathy.  Neurological: She is alert and oriented to person, place, and time. She is not disoriented. No cranial nerve deficit or sensory deficit. Gait normal.       RUE 4/5 strength, LUE 5/5 (obvious difference) RLE 5/5 evaluated in bed, LLE 5/5  Skin: Skin is dry. No rash noted.    MAU Course  Procedures Results for orders placed during the hospital encounter of 05/14/12 (from the past 24 hour(s))  URINALYSIS, ROUTINE W REFLEX MICROSCOPIC     Status: Abnormal   Collection Time   05/14/12  1:05 PM      Component Value Range   Color, Urine YELLOW  YELLOW   APPearance HAZY (*) CLEAR   Specific Gravity, Urine 1.020  1.005 - 1.030   pH 7.0  5.0 - 8.0   Glucose, UA NEGATIVE  NEGATIVE mg/dL   Hgb urine dipstick TRACE (*) NEGATIVE   Bilirubin Urine NEGATIVE  NEGATIVE   Ketones, ur NEGATIVE  NEGATIVE mg/dL   Protein, ur NEGATIVE  NEGATIVE mg/dL   Urobilinogen, UA 1.0  0.0 - 1.0 mg/dL   Nitrite NEGATIVE  NEGATIVE   Leukocytes, UA MODERATE (*) NEGATIVE  URINE MICROSCOPIC-ADD ON     Status: Abnormal   Collection Time   05/14/12  1:05 PM      Component Value Range   Squamous Epithelial / LPF FEW (*) RARE   WBC, UA 11-20  <3 WBC/hpf   RBC / HPF 3-6  <3 RBC/hpf   Bacteria, UA MANY (*) RARE   FHT baseline 145, moderate variability, reactive Contractions noted every 4-7 minutes  MDM 2:50- Patient seen and examined. Noticeable weakness RUE. Cranial nerves appear intact, and no speech deficit appreciated. Since patient reports tongue, face, arm and leg involvement and acute onset, will get Head CT without contrast to evaluate for CVA or other cause. Will also check cbc for anemia and CMet for electrolytes. BP stable, no protein in urine. Continue to monitor. Discussed with Sharen Counter, CNM and Dr. Jolayne Panther who agree with plan.  4:30- Head CT reviewed; negative. CBC and Cmet unremarkable. Discussed with Dr. Jolayne Panther and Sharen Counter, CNM   Assessment and Plan  21  yo G1 at [redacted]w[redacted]d presenting with acute onset right sided weakness  - Head CT and labs within normal limits. Most likely conversion disorder and/or anxiety related - Will follow up with [facility] later this week - Given labor precautions that should prompt her return - Discussed with Dr. Despina Hidden prior to discharge   HAIRFORD, AMBER 05/14/2012, 2:38 PM   I have seen this patient and agree with the above resident's note.  LEFTWICH-KIRBY, Natsuko Kelsay Certified Nurse-Midwife

## 2012-05-14 NOTE — MAU Note (Signed)
C/o feeling numbness on the R side and felt like she was swallowing her tongue around 0400 this AM; c/o feeling confused since 0400;

## 2012-05-15 ENCOUNTER — Encounter (HOSPITAL_COMMUNITY): Payer: Self-pay | Admitting: *Deleted

## 2012-05-15 ENCOUNTER — Inpatient Hospital Stay (HOSPITAL_COMMUNITY)
Admission: AD | Admit: 2012-05-15 | Discharge: 2012-05-15 | Disposition: A | Discharge status: Home / Self Care | Payer: Medicaid Other | Source: Ambulatory Visit | Attending: Family Medicine | Admitting: Family Medicine

## 2012-05-15 DIAGNOSIS — O99891 Other specified diseases and conditions complicating pregnancy: Secondary | ICD-10-CM | POA: Insufficient documentation

## 2012-05-15 DIAGNOSIS — G43109 Migraine with aura, not intractable, without status migrainosus: Secondary | ICD-10-CM

## 2012-05-15 DIAGNOSIS — H538 Other visual disturbances: Secondary | ICD-10-CM | POA: Insufficient documentation

## 2012-05-15 DIAGNOSIS — R030 Elevated blood-pressure reading, without diagnosis of hypertension: Secondary | ICD-10-CM | POA: Insufficient documentation

## 2012-05-15 DIAGNOSIS — Z331 Pregnant state, incidental: Secondary | ICD-10-CM

## 2012-05-15 DIAGNOSIS — G43909 Migraine, unspecified, not intractable, without status migrainosus: Secondary | ICD-10-CM | POA: Insufficient documentation

## 2012-05-15 LAB — PROTEIN / CREATININE RATIO, URINE
Creatinine, Urine: 48.12 mg/dL
Protein Creatinine Ratio: 0.11 (ref 0.00–0.15)
Total Protein, Urine: 5.4 mg/dL

## 2012-05-15 LAB — URINALYSIS, ROUTINE W REFLEX MICROSCOPIC
Bilirubin Urine: NEGATIVE
Glucose, UA: NEGATIVE mg/dL
Ketones, ur: NEGATIVE mg/dL
Leukocytes, UA: NEGATIVE
Nitrite: NEGATIVE
Protein, ur: NEGATIVE mg/dL
Specific Gravity, Urine: <1.005 — ABNORMAL LOW (ref 1.005–1.030)
Urobilinogen, UA: 0.2 mg/dL (ref 0.0–1.0)
pH: 6.5 (ref 5.0–8.0)

## 2012-05-15 LAB — URINE MICROSCOPIC-ADD ON

## 2012-05-15 MED ORDER — SODIUM CHLORIDE 0.9 % IV SOLN
25.0000 mg | Freq: Once | INTRAVENOUS | Status: DC
  Filled 2012-05-15: qty 1

## 2012-05-15 MED ORDER — DEXAMETHASONE SODIUM PHOSPHATE 10 MG/ML IJ SOLN
10.0000 mg | Freq: Once | INTRAMUSCULAR | Status: AC
  Administered 2012-05-15: 10 mg via INTRAVENOUS
  Filled 2012-05-15: qty 1

## 2012-05-15 MED ORDER — LACTATED RINGERS IV BOLUS (SEPSIS)
1000.0000 mL | Freq: Once | INTRAVENOUS | Status: AC
  Administered 2012-05-15: 1000 mL via INTRAVENOUS

## 2012-05-15 MED ORDER — PROMETHAZINE HCL 25 MG/ML IJ SOLN
12.5000 mg | Freq: Once | INTRAMUSCULAR | Status: AC
  Administered 2012-05-15: 12.5 mg via INTRAVENOUS
  Filled 2012-05-15: qty 1

## 2012-05-15 MED ORDER — BUTALBITAL-APAP-CAFFEINE 50-325-40 MG PO TABS
2.0000 | ORAL_TABLET | Freq: Once | ORAL | Status: AC
  Administered 2012-05-15: 2 via ORAL
  Filled 2012-05-15: qty 2

## 2012-05-15 MED ORDER — DIPHENHYDRAMINE HCL 50 MG/ML IJ SOLN
25.0000 mg | Freq: Once | INTRAMUSCULAR | Status: AC
  Administered 2012-05-15: 25 mg via INTRAVENOUS
  Filled 2012-05-15: qty 1

## 2012-05-15 MED ORDER — ISOMETHEPTENE-APAP-DICHLORAL 65-325-100 MG PO CAPS: 1.0000 | ORAL_CAPSULE | ORAL | Status: DC | PRN

## 2012-05-15 NOTE — MAU Provider Note (Signed)
Chief Complaint:  Headache, Labor Eval and Hypertension   First Provider Initiated Contact with Patient 05/15/12 1817     HPI: Meagan [name] is a 21 y.o. G1P0000 at [redacted]w[redacted]d who presents to maternity admissions by EMS reporting: 1. Blurry vision R>L this afternoon 2. Left-sided headache since yesterday, 7/10 on pain scale. Constant, dull.  3. Mild-Moderate contractions every 2-3 min 4. Elevated BP 160/100's in the ambulance 5. Weakness in left lower arm x 15 minutes while in ambulance  Denies contractions, leakage of fluid, vaginal bleeding, difficulty w/ speech or gait or epigastric pain. Good fetal movement. Was seen in MAU yesterday w/ same Sx, neg head CT and normal PIH labs. Has Hx of Migraines, but none in pregnancy and has had three prior episodes of similar vision changes earlier this pregnancy that were not worked up. Usually takes ibuprofen w/ good relief, but cannot take at this gestation. She has Hx of anxiety for which she takes Xanax.   Pregnancy Course:   Past Medical History: Past Medical History  Diagnosis Date  . Asthma   . Migraine   . Anxiety   . Pregnant state, incidental   . Mental disorder     huffed inhalants "affected brain"  . Kidney stones   . Pyelonephritis complicating pregnancy, antepartum, first trimester 11/05/2011    Past obstetric history: OB History    Grav Para Term Preterm Abortions TAB SAB Ect Mult Living   1 0 0 0 0 0 0 0 0 0      # Outc Date GA Lbr Len/2nd Wgt Sex Del Anes PTL Lv   1 CUR               Past Surgical History: Past Surgical History  Procedure Date  . No past surgeries     Family History: Family History  Problem Relation Age of Onset  . Cancer Other   . Migraines Other     Social History: History  Substance Use Topics  . Smoking status: Former Smoker -- 1.0 packs/day for 4 years    Types: Cigarettes    Quit date: 12/03/2011  . Smokeless tobacco: Never Used  . Alcohol Use: No    Allergies:  Allergies    Allergen Reactions  . Codeine Nausea And Vomiting    Ataxia/vertigo  . Penicillins Other (See Comments)    Reaction as a small child, unknown    Meds:  Prescriptions prior to admission  Medication Sig Dispense Refill  . albuterol (PROVENTIL HFA;VENTOLIN HFA) 108 (90 BASE) MCG/ACT inhaler Inhale 2 puffs into the lungs every 4 (four) hours as needed. For coughing      . ALPRAZolam (XANAX) 0.5 MG tablet Take 0.5 mg by mouth 2 (two) times daily as needed. anxiety      . cephALEXin (KEFLEX) 500 MG capsule Take 500 mg by mouth at bedtime. Take through delivery      . Prenatal Vit-Fe Fumarate-FA (PRENATAL MULTIVITAMIN) TABS Take 1 tablet by mouth daily.        ROS: Pertinent findings in history of present illness.  Physical Exam  Blood pressure 135/71, pulse 107, temperature 97.1 F (36.2 C), temperature source Oral, resp. rate 20, height 5\' 2"  (1.575 m), weight 79.833 kg (176 lb), last menstrual period 08/16/2011, SpO2 99.00%. GENERAL: Well-developed, well-nourished female in no acute distress.  HEENT: normocephalic, PERRLA, no scleral icterus  HEART: Tachycardic, initially 120's, then 100's. Regular rhythm. No murmurs, rubs or gallops. RESP: normal effort ABDOMEN: Soft, non-tender, gravid appropriate for gestational  age EXTREMITIES: Nontender, no edema, DTRs 2+, no clonus NEURO: Alert and oriented x 4. Strength and sensation equal bilat in upper and lower extremities. Facial movements symmetrical.  SPECULUM EXAM: deferred Dilation: 1 Effacement (%): Thick Cervical Position: Posterior Exam by:: L. Paschal, RN  FHT:  Baseline 130-140 , moderate variability, accelerations present, no decelerations Contractions: q 2-5 mins, mild   Labs: Results for orders placed during the hospital encounter of 05/15/12 (from the past 24 hour(s))  URINALYSIS, ROUTINE W REFLEX MICROSCOPIC     Status: Abnormal   Collection Time   05/15/12 12:35 PM      Component Value Range   Color, Urine YELLOW   YELLOW   APPearance CLEAR  CLEAR   Specific Gravity, Urine <1.005 (*) 1.005 - 1.030   pH 6.5  5.0 - 8.0   Glucose, UA NEGATIVE  NEGATIVE mg/dL   Hgb urine dipstick MODERATE (*) NEGATIVE   Bilirubin Urine NEGATIVE  NEGATIVE   Ketones, ur NEGATIVE  NEGATIVE mg/dL   Protein, ur NEGATIVE  NEGATIVE mg/dL   Urobilinogen, UA 0.2  0.0 - 1.0 mg/dL   Nitrite NEGATIVE  NEGATIVE   Leukocytes, UA NEGATIVE  NEGATIVE  URINE MICROSCOPIC-ADD ON     Status: Normal   Collection Time   05/15/12 12:35 PM      Component Value Range   Squamous Epithelial / LPF RARE  RARE   WBC, UA 0-2  <3 WBC/hpf   RBC / HPF 0-2  <3 RBC/hpf   Bacteria, UA RARE  RARE  PROTEIN / CREATININE RATIO, URINE     Status: Normal   Collection Time   05/15/12 12:35 PM      Component Value Range   Creatinine, Urine 48.12     Total Protein, Urine 5.4     PROTEIN CREATININE RATIO 0.11  0.00 - 0.15   Imaging:   MAU Course: 1500: Minimal relief of HA w/ Fioricet. Will give IV fluids and Migraine cocktail Decadron, Benadryl and phenergan. Discussed Sx, work-up yesterday and exam w/ Dr. Shawnie Pons. No other labs or imaging needed. May D/C if HA resolves w/ cocktail.  1800: HA resolved No vision changes or numbness while in MAU.   Assessment: 1. Migraine with aura   2. Pregnancy, incidental    Plan: Discharge home Labor precautions and fetal kick counts Comfort measures for Migraine. PIH precautions.    Medication List     As of 05/15/2012  6:47 PM    TAKE these medications         albuterol 108 (90 BASE) MCG/ACT inhaler   Commonly known as: PROVENTIL HFA;VENTOLIN HFA   Inhale 2 puffs into the lungs every 4 (four) hours as needed. For coughing      ALPRAZolam 0.5 MG tablet   Commonly known as: XANAX   Take 0.5 mg by mouth 2 (two) times daily as needed. anxiety      cephALEXin 500 MG capsule   Commonly known as: KEFLEX   Take 500 mg by mouth at bedtime. Take through delivery       isometheptene-acetaminophen-dichloralphenazone 65-325-100 MG capsule   Commonly known as: MIDRIN   Take 1-2 capsules by mouth every 4 (four) hours as needed.      prenatal multivitamin Tabs   Take 1 tablet by mouth daily.        [place], CNM 05/15/2012 2:20 PM

## 2012-05-15 NOTE — Progress Notes (Signed)
CNM informed pt states her HA is gone & she wants to eat.  OK for pt to have meal, CNM states she is coming to see pt.

## 2012-05-15 NOTE — MAU Note (Signed)
Pt states she was as [employer] vision blackened and has had a headache since EMS arrived.  Pt reports U/C's are every 2-3 min.  No vaginal bleeding or ROM.  Good fetal movement.

## 2012-05-16 LAB — URINE CULTURE: Colony Count: 9000

## 2012-05-16 NOTE — MAU Provider Note (Signed)
Chart reviewed and agree with management and plan.  

## 2012-05-25 ENCOUNTER — Encounter (HOSPITAL_COMMUNITY): Payer: Self-pay | Admitting: Obstetrics and Gynecology

## 2012-05-25 ENCOUNTER — Inpatient Hospital Stay (HOSPITAL_COMMUNITY)
Admission: AD | Admit: 2012-05-25 | Discharge: 2012-05-25 | Disposition: A | Discharge status: Home / Self Care | Payer: Medicaid Other | Source: Ambulatory Visit | Attending: Obstetrics & Gynecology | Admitting: Obstetrics & Gynecology

## 2012-05-25 DIAGNOSIS — O479 False labor, unspecified: Secondary | ICD-10-CM | POA: Insufficient documentation

## 2012-05-25 DIAGNOSIS — N898 Other specified noninflammatory disorders of vagina: Secondary | ICD-10-CM

## 2012-05-25 DIAGNOSIS — N949 Unspecified condition associated with female genital organs and menstrual cycle: Secondary | ICD-10-CM | POA: Insufficient documentation

## 2012-05-25 LAB — WET PREP, GENITAL
Clue Cells Wet Prep HPF POC: NONE SEEN
Trich, Wet Prep: NONE SEEN
Yeast Wet Prep HPF POC: NONE SEEN

## 2012-05-25 NOTE — MAU Provider Note (Signed)
Chief Complaint:  Rupture of Membranes  HPI: Meagan [name] is a 21 y.o. G1P0000 at [redacted]w[redacted]d who presents to maternity admissions reporting vaginal discharge since 2:00am this morning. Described as pinkish -yellowish. Last sexual intercourse: last night. Denies regular painful contractions, leakage of fluid or vaginal bleeding. Good fetal movement.   Pregnancy Course:   Past Medical History: Past Medical History  Diagnosis Date  . Asthma   . Migraine   . Anxiety   . Pregnant state, incidental   . Mental disorder     huffed inhalants "affected brain"  . Kidney stones   . Pyelonephritis complicating pregnancy, antepartum, first trimester 11/05/2011    Past obstetric history: OB History    Grav Para Term Preterm Abortions TAB SAB Ect Mult Living   1 0 0 0 0 0 0 0 0 0       Past Surgical History: Past Surgical History  Procedure Date  . No past surgeries     Family History: Family History  Problem Relation Age of Onset  . Cancer Other   . Migraines Other     Social History: History  Substance Use Topics  . Smoking status: Former Smoker -- 1.0 packs/day for 4 years    Types: Cigarettes    Quit date: 12/03/2011  . Smokeless tobacco: Never Used  . Alcohol Use: No    Allergies:  Allergies  Allergen Reactions  . Codeine Nausea And Vomiting    Ataxia/vertigo  . Penicillins Other (See Comments)    Reaction as a small child, unknown    Meds:  Prescriptions prior to admission  Medication Sig Dispense Refill  . albuterol (PROVENTIL HFA;VENTOLIN HFA) 108 (90 BASE) MCG/ACT inhaler Inhale 2 puffs into the lungs every 4 (four) hours as needed. For coughing      . ALPRAZolam (XANAX) 0.5 MG tablet Take 0.5 mg by mouth 2 (two) times daily as needed. anxiety      . cephALEXin (KEFLEX) 500 MG capsule Take 500 mg by mouth at bedtime. Take through delivery      . Prenatal Vit-Fe Fumarate-FA (PRENATAL MULTIVITAMIN) TABS Take 1 tablet by mouth daily.      Marland Kitchen  isometheptene-acetaminophen-dichloralphenazone (MIDRIN) 65-325-100 MG capsule Take 1-2 capsules by mouth every 4 (four) hours as needed.  30 capsule  1    ROS: Pertinent findings in history of present illness.  Physical Exam  Blood pressure 136/71, pulse 128, temperature 97.7 F (36.5 C), temperature source Oral, resp. rate 18, height 5\' 2"  (1.575 m), weight 188 lb (85.276 kg), last menstrual period 08/16/2011. GENERAL: Well-developed, well-nourished female in no acute distress.  HEENT: normocephalic HEART: normal rate RESP: normal effort ABDOMEN: Soft, non-tender, gravid appropriate for gestational age EXTREMITIES: Nontender, no edema NEURO: alert and oriented SPECULUM EXAM: NEFG, white discharge, no blood, cervix clean. Vaginal exam: anterior, finger tip/50%/ ballotable. Vertex.    FHT:  Baseline 145 , moderate variability, accelerations present, no decelerations Contractions: q irregular 5-7 mins  Labs: Results for orders placed during the hospital encounter of 05/25/12 (from the past 24 hour(s))  WET PREP, GENITAL     Status: Abnormal   Collection Time   05/25/12  3:24 PM      Component Value Range   Yeast Wet Prep HPF POC NONE SEEN  NONE SEEN   Trich, Wet Prep NONE SEEN  NONE SEEN   Clue Cells Wet Prep HPF POC NONE SEEN  NONE SEEN   WBC, Wet Prep HPF POC FEW (*) NONE SEEN  MAU Course: Wet prep negative. Negative ferning. FHM category II. Maternal contractions irregular.   Assessment: 1. Vaginal discharge    Plan: Discharge home Labor precautions and fetal kick counts    Medication List     As of 05/25/2012  2:42 PM    ASK your doctor about these medications         albuterol 108 (90 BASE) MCG/ACT inhaler   Commonly known as: PROVENTIL HFA;VENTOLIN HFA   Inhale 2 puffs into the lungs every 4 (four) hours as needed. For coughing      ALPRAZolam 0.5 MG tablet   Commonly known as: XANAX   Take 0.5 mg by mouth 2 (two) times daily as needed. anxiety       cephALEXin 500 MG capsule   Commonly known as: KEFLEX   Take 500 mg by mouth at bedtime. Take through delivery      isometheptene-acetaminophen-dichloralphenazone 65-325-100 MG capsule   Commonly known as: MIDRIN   Take 1-2 capsules by mouth every 4 (four) hours as needed.      prenatal multivitamin Tabs   Take 1 tablet by mouth daily.        Dayarmys Pil[place] de Criselda Peaches, MD 05/25/2012 2:42 PM  I saw and examined this patient with resident and agree with above note Georges Mouse, CNM 05/25/12 1712

## 2012-05-25 NOTE — MAU Note (Signed)
Pt says that she feels very anxious and is upset that the baby is not ready to come. Says she would just feel better if she was able to go home. I notified Dr. Aviva Signs and she is aware that if medically necessary the patient is ready to be discharged home.

## 2012-05-25 NOTE — MAU Note (Signed)
Pt reports having a cream colored pink tinged fluid that has been happening since 4am. Pt reports mild ctx on and off . Good fetal movement.

## 2012-05-27 ENCOUNTER — Telehealth (HOSPITAL_COMMUNITY): Payer: Self-pay | Admitting: *Deleted

## 2012-05-27 ENCOUNTER — Encounter (HOSPITAL_COMMUNITY): Payer: Self-pay | Admitting: *Deleted

## 2012-05-27 NOTE — Telephone Encounter (Signed)
Preadmission screen  

## 2012-05-30 ENCOUNTER — Inpatient Hospital Stay (HOSPITAL_COMMUNITY)
Admission: RE | Admit: 2012-05-30 | Discharge: 2012-05-30 | Disposition: A | Discharge status: Home / Self Care | Payer: Medicaid Other | Source: Ambulatory Visit

## 2012-05-30 ENCOUNTER — Encounter (HOSPITAL_COMMUNITY): Payer: Self-pay | Admitting: *Deleted

## 2012-05-30 ENCOUNTER — Inpatient Hospital Stay (HOSPITAL_COMMUNITY)
Admission: AD | Admit: 2012-05-30 | Discharge: 2012-06-01 | DRG: 775 | Disposition: A | Discharge status: Home / Self Care | Payer: Medicaid Other | Source: Ambulatory Visit | Attending: Obstetrics and Gynecology | Admitting: Obstetrics and Gynecology

## 2012-05-30 ENCOUNTER — Inpatient Hospital Stay (HOSPITAL_COMMUNITY): Payer: Medicaid Other | Admitting: Anesthesiology

## 2012-05-30 ENCOUNTER — Encounter (HOSPITAL_COMMUNITY): Payer: Self-pay | Admitting: Anesthesiology

## 2012-05-30 DIAGNOSIS — Z2989 Encounter for other specified prophylactic measures: Secondary | ICD-10-CM

## 2012-05-30 DIAGNOSIS — Z298 Encounter for other specified prophylactic measures: Secondary | ICD-10-CM

## 2012-05-30 DIAGNOSIS — IMO0001 Reserved for inherently not codable concepts without codable children: Secondary | ICD-10-CM

## 2012-05-30 LAB — RPR: RPR Ser Ql: NONREACTIVE

## 2012-05-30 LAB — CBC
HCT: 36.8 % (ref 36.0–46.0)
Hemoglobin: 12.3 g/dL (ref 12.0–15.0)
MCH: 30.4 pg (ref 26.0–34.0)
MCHC: 33.4 g/dL (ref 30.0–36.0)
MCV: 91.1 fL (ref 78.0–100.0)
Platelets: 190 10*3/uL (ref 150–400)
RBC: 4.04 MIL/uL (ref 3.87–5.11)
RDW: 13.6 % (ref 11.5–15.5)
WBC: 15.4 10*3/uL — ABNORMAL HIGH (ref 4.0–10.5)

## 2012-05-30 LAB — ABO/RH: ABO/RH(D): A NEG

## 2012-05-30 MED ORDER — ONDANSETRON HCL 4 MG PO TABS: 4.0000 mg | ORAL_TABLET | ORAL | Status: DC | PRN

## 2012-05-30 MED ORDER — PRENATAL MULTIVITAMIN CH
1.0000 | ORAL_TABLET | Freq: Every day | ORAL | Status: DC
  Administered 2012-05-31 – 2012-06-01 (×2): 1 via ORAL
  Filled 2012-05-30 (×2): qty 1

## 2012-05-30 MED ORDER — OXYTOCIN 40 UNITS IN LACTATED RINGERS INFUSION - SIMPLE MED: 62.5000 mL/h | INTRAVENOUS | Status: DC

## 2012-05-30 MED ORDER — SIMETHICONE 80 MG PO CHEW
80.0000 mg | CHEWABLE_TABLET | ORAL | Status: DC | PRN
  Administered 2012-05-31: 80 mg via ORAL

## 2012-05-30 MED ORDER — PHENYLEPHRINE 40 MCG/ML (10ML) SYRINGE FOR IV PUSH (FOR BLOOD PRESSURE SUPPORT)
80.0000 ug | PREFILLED_SYRINGE | INTRAVENOUS | Status: DC | PRN
  Filled 2012-05-30: qty 2
  Filled 2012-05-30: qty 5

## 2012-05-30 MED ORDER — SODIUM BICARBONATE 8.4 % IV SOLN
INTRAVENOUS | Status: DC | PRN
  Administered 2012-05-30: 5 mL via EPIDURAL

## 2012-05-30 MED ORDER — ALPRAZOLAM 0.5 MG PO TABS
0.5000 mg | ORAL_TABLET | Freq: Two times a day (BID) | ORAL | Status: DC | PRN
  Administered 2012-05-30: 0.5 mg via ORAL
  Filled 2012-05-30: qty 1

## 2012-05-30 MED ORDER — FENTANYL 2.5 MCG/ML BUPIVACAINE 1/10 % EPIDURAL INFUSION (WH - ANES)
14.0000 mL/h | INTRAMUSCULAR | Status: DC
  Administered 2012-05-30 (×2): 14 mL/h via EPIDURAL
  Filled 2012-05-30 (×3): qty 125

## 2012-05-30 MED ORDER — DIBUCAINE 1 % RE OINT: 1.0000 "application " | TOPICAL_OINTMENT | RECTAL | Status: DC | PRN

## 2012-05-30 MED ORDER — EPHEDRINE 5 MG/ML INJ
10.0000 mg | INTRAVENOUS | Status: DC | PRN
  Filled 2012-05-30: qty 2
  Filled 2012-05-30: qty 4

## 2012-05-30 MED ORDER — ACETAMINOPHEN 325 MG PO TABS: 650.0000 mg | ORAL_TABLET | ORAL | Status: DC | PRN

## 2012-05-30 MED ORDER — IBUPROFEN 600 MG PO TABS
600.0000 mg | ORAL_TABLET | Freq: Four times a day (QID) | ORAL | Status: DC | PRN
  Filled 2012-05-30: qty 1

## 2012-05-30 MED ORDER — CITRIC ACID-SODIUM CITRATE 334-500 MG/5ML PO SOLN: 30.0000 mL | ORAL | Status: DC | PRN

## 2012-05-30 MED ORDER — TETANUS-DIPHTH-ACELL PERTUSSIS 5-2.5-18.5 LF-MCG/0.5 IM SUSP: 0.5000 mL | Freq: Once | INTRAMUSCULAR | Status: DC

## 2012-05-30 MED ORDER — LIDOCAINE HCL (PF) 1 % IJ SOLN
INTRAMUSCULAR | Status: DC | PRN
  Administered 2012-05-30 (×2): 4 mL

## 2012-05-30 MED ORDER — ZOLPIDEM TARTRATE 5 MG PO TABS: 5.0000 mg | ORAL_TABLET | Freq: Every evening | ORAL | Status: DC | PRN

## 2012-05-30 MED ORDER — DIPHENHYDRAMINE HCL 50 MG/ML IJ SOLN
12.5000 mg | INTRAMUSCULAR | Status: AC | PRN
  Administered 2012-05-30 (×3): 12.5 mg via INTRAVENOUS
  Filled 2012-05-30: qty 1

## 2012-05-30 MED ORDER — PHENYLEPHRINE 40 MCG/ML (10ML) SYRINGE FOR IV PUSH (FOR BLOOD PRESSURE SUPPORT)
80.0000 ug | PREFILLED_SYRINGE | INTRAVENOUS | Status: DC | PRN
  Filled 2012-05-30: qty 2

## 2012-05-30 MED ORDER — OXYTOCIN BOLUS FROM INFUSION
500.0000 mL | INTRAVENOUS | Status: DC
  Administered 2012-05-30: 500 mL via INTRAVENOUS

## 2012-05-30 MED ORDER — ACETAMINOPHEN 500 MG PO TABS
1000.0000 mg | ORAL_TABLET | Freq: Once | ORAL | Status: AC
  Administered 2012-05-30: 1000 mg via ORAL
  Filled 2012-05-30: qty 2

## 2012-05-30 MED ORDER — EPHEDRINE 5 MG/ML INJ
10.0000 mg | INTRAVENOUS | Status: DC | PRN
  Filled 2012-05-30: qty 2

## 2012-05-30 MED ORDER — LACTATED RINGERS IV SOLN: 500.0000 mL | INTRAVENOUS | Status: DC | PRN

## 2012-05-30 MED ORDER — FLEET ENEMA 7-19 GM/118ML RE ENEM: 1.0000 | ENEMA | RECTAL | Status: DC | PRN

## 2012-05-30 MED ORDER — OXYCODONE-ACETAMINOPHEN 5-325 MG PO TABS
1.0000 | ORAL_TABLET | ORAL | Status: DC | PRN
  Administered 2012-05-31 (×2): 1 via ORAL
  Filled 2012-05-30 (×2): qty 1

## 2012-05-30 MED ORDER — BENZOCAINE-MENTHOL 20-0.5 % EX AERO
1.0000 "application " | INHALATION_SPRAY | CUTANEOUS | Status: DC | PRN
  Administered 2012-05-30: 1 via TOPICAL
  Filled 2012-05-30: qty 56

## 2012-05-30 MED ORDER — ONDANSETRON HCL 4 MG/2ML IJ SOLN: 4.0000 mg | INTRAMUSCULAR | Status: DC | PRN

## 2012-05-30 MED ORDER — ALPRAZOLAM 0.5 MG PO TABS
0.5000 mg | ORAL_TABLET | Freq: Two times a day (BID) | ORAL | Status: DC
  Administered 2012-05-30 – 2012-06-01 (×4): 0.5 mg via ORAL
  Filled 2012-05-30 (×5): qty 1

## 2012-05-30 MED ORDER — OXYTOCIN 40 UNITS IN LACTATED RINGERS INFUSION - SIMPLE MED
1.0000 m[IU]/min | INTRAVENOUS | Status: DC
  Administered 2012-05-30: 2 m[IU]/min via INTRAVENOUS
  Filled 2012-05-30: qty 1000

## 2012-05-30 MED ORDER — IBUPROFEN 600 MG PO TABS
600.0000 mg | ORAL_TABLET | Freq: Four times a day (QID) | ORAL | Status: DC
  Administered 2012-05-30 – 2012-06-01 (×8): 600 mg via ORAL
  Filled 2012-05-30 (×7): qty 1

## 2012-05-30 MED ORDER — WITCH HAZEL-GLYCERIN EX PADS: 1.0000 "application " | MEDICATED_PAD | CUTANEOUS | Status: DC | PRN

## 2012-05-30 MED ORDER — LANOLIN HYDROUS EX OINT: TOPICAL_OINTMENT | CUTANEOUS | Status: DC | PRN

## 2012-05-30 MED ORDER — DIPHENHYDRAMINE HCL 25 MG PO CAPS: 25.0000 mg | ORAL_CAPSULE | Freq: Four times a day (QID) | ORAL | Status: DC | PRN

## 2012-05-30 MED ORDER — FENTANYL CITRATE 0.05 MG/ML IJ SOLN
100.0000 ug | INTRAMUSCULAR | Status: DC | PRN
  Administered 2012-05-30: 100 ug via INTRAVENOUS
  Filled 2012-05-30: qty 2

## 2012-05-30 MED ORDER — FENTANYL 2.5 MCG/ML BUPIVACAINE 1/10 % EPIDURAL INFUSION (WH - ANES)
INTRAMUSCULAR | Status: DC | PRN
  Administered 2012-05-30: 14 mL/h via EPIDURAL

## 2012-05-30 MED ORDER — TERBUTALINE SULFATE 1 MG/ML IJ SOLN: 0.2500 mg | Freq: Once | INTRAMUSCULAR | Status: DC | PRN

## 2012-05-30 MED ORDER — LACTATED RINGERS IV SOLN
500.0000 mL | Freq: Once | INTRAVENOUS | Status: AC
  Administered 2012-05-30: 500 mL via INTRAVENOUS

## 2012-05-30 MED ORDER — SENNOSIDES-DOCUSATE SODIUM 8.6-50 MG PO TABS
2.0000 | ORAL_TABLET | Freq: Every day | ORAL | Status: DC
  Administered 2012-05-30 – 2012-05-31 (×2): 2 via ORAL

## 2012-05-30 MED ORDER — ONDANSETRON HCL 4 MG/2ML IJ SOLN
4.0000 mg | Freq: Four times a day (QID) | INTRAMUSCULAR | Status: DC | PRN
  Administered 2012-05-30 (×2): 4 mg via INTRAVENOUS
  Filled 2012-05-30 (×2): qty 2

## 2012-05-30 MED ORDER — LACTATED RINGERS IV SOLN
INTRAVENOUS | Status: DC
  Administered 2012-05-30 (×4): via INTRAVENOUS

## 2012-05-30 MED ORDER — LIDOCAINE HCL (PF) 1 % IJ SOLN
30.0000 mL | INTRAMUSCULAR | Status: DC | PRN
  Filled 2012-05-30: qty 30

## 2012-05-30 NOTE — MAU Provider Note (Signed)
  History     CSN: [id number]  Arrival date and time: 05/30/12 0304   None     Chief Complaint  Patient presents with  . Labor Eval   HPI  Meagan [name] is a 21 y.o. G1P0000 at [redacted]w[redacted]d who presents which contractions.  Pt reports contractions since 4am yesterday. She states that contractions are worse since midnight.   Denies leakage of fluid or vaginal bleeding. Good fetal movement.  Pt has induction schedule for 7PM today.   Past Medical History  Diagnosis Date  . Asthma   . Migraine   . Anxiety   . Pregnant state, incidental   . Mental disorder     huffed inhalants "affected brain"  . Kidney stones   . Pyelonephritis complicating pregnancy, antepartum, first trimester 11/05/2011    Past Surgical History  Procedure Date  . No past surgeries     History reviewed. No pertinent family history.  History  Substance Use Topics  . Smoking status: Former Smoker -- 1.0 packs/day for 4 years    Types: Cigarettes    Quit date: 12/03/2011  . Smokeless tobacco: Never Used  . Alcohol Use: No    Allergies:  Allergies  Allergen Reactions  . Codeine Nausea And Vomiting    Ataxia/vertigo  . Penicillins Other (See Comments)    Reaction as a small child, unknown    Prescriptions prior to admission  Medication Sig Dispense Refill  . ALPRAZolam (XANAX) 0.5 MG tablet Take 0.5 mg by mouth 2 (two) times daily as needed. anxiety      . cephALEXin (KEFLEX) 500 MG capsule Take 500 mg by mouth at bedtime. Take through delivery      . Prenatal Vit-Fe Fumarate-FA (PRENATAL MULTIVITAMIN) TABS Take 1 tablet by mouth daily.      Marland Kitchen albuterol (PROVENTIL HFA;VENTOLIN HFA) 108 (90 BASE) MCG/ACT inhaler Inhale 2 puffs into the lungs every 4 (four) hours as needed. For coughing      . isometheptene-acetaminophen-dichloralphenazone (MIDRIN) 65-325-100 MG capsule Take 1-2 capsules by mouth every 4 (four) hours as needed.  30 capsule  1    Review of Systems  Constitutional: Negative for  fever and chills.  Eyes: Negative for blurred vision and double vision.  Respiratory: Negative for cough.   Cardiovascular: Negative for chest pain.  Gastrointestinal: Negative for nausea and vomiting.  Neurological: Negative for headaches.   Physical Exam   Blood pressure 137/75, pulse 99, temperature 97.1 F (36.2 C), temperature source Oral, resp. rate 20, last menstrual period 08/16/2011.  Physical Exam  Constitutional: She appears well-developed.  HENT:  Head: Normocephalic.  Neck: Normal range of motion.  Cardiovascular: Normal rate and regular rhythm.   Respiratory: Effort normal and breath sounds normal.  Vaginal exam: anterior,3/70%/ -2. Vertex  FHT: Baseline 120s , moderate variability, accelerations present, no decelerations  Contractions: Q5-38min, regular  MAU Course  Procedures  MDM Tylenol  Monitoring for 1 hour Recheck cervix in 1 hour if change will admit.  Assessment and Plan  Mrs. Youkhana is a 21 y.o. G1P0000 at [redacted]w[redacted]d who presents which contractions.  Cervix changed from 3cm to 4cm in MAU  Plan: admission to [facility]  Chelsea Aus, Rosalyn Gess E 05/30/2012, 3:31 AM   I have seen this patient and agree with the above resident's note.  LEFTWICH-KIRBY, Cadience Bradfield Certified Nurse-Midwife

## 2012-05-30 NOTE — Progress Notes (Signed)
Meagan [name] is a 21 y.o. G1P0000 at [redacted]w[redacted]d  admitted for labor  Subjective: Feeling well. No pressure. On epidural.  Objective: BP 121/54  Pulse 83  Temp 98.2 F (36.8 C) (Oral)  Resp 18  Ht 5\' 2"  (1.575 m)  Wt 85.276 kg (188 lb)  BMI 34.39 kg/m2  SpO2 100%  LMP 08/16/2011     FHT:  FHR: 145 bpm, variability: moderate,  accelerations:  Present,  decelerations:  Absent UC:   regular, every 2 minutes SVE:   Dilation: 6 Effacement (%): 80 Station: -1 Exam by:: Dr. Aviva Signs  Assessment / Plan: Augmentation of labor, progressing well  Labor: Progressing on Pitocin, will continue to increase then AROM Fetal Wellbeing:  Category II Pain Control:  Epidural I/D:  n/a Anticipated MOD:  NSVD  PILOTO, Meagan [name] 05/30/2012, 12:47 PM

## 2012-05-30 NOTE — Anesthesia Preprocedure Evaluation (Signed)
Anesthesia Evaluation    Airway Mallampati: III TM Distance: >3 FB Neck ROM: Full    Dental No notable dental hx. (+) Teeth Intact   Pulmonary asthma , former smoker,  breath sounds clear to auscultation  Pulmonary exam normal       Cardiovascular Rhythm:Regular Rate:Normal     Neuro/Psych  Headaches, PSYCHIATRIC DISORDERS Anxiety    GI/Hepatic Neg liver ROS, GERD-  Medicated and Controlled,  Endo/Other  Obesity  Renal/GU Renal diseaseHx/o Renal Calculi Hx/o Pyelonephritis during pregnancy  negative genitourinary   Musculoskeletal negative musculoskeletal ROS (+)   Abdominal (+) + obese,   Peds  Hematology negative hematology ROS (+)   Anesthesia Other Findings   Reproductive/Obstetrics (+) Pregnancy                           Anesthesia Physical Anesthesia Plan  ASA: II  Anesthesia Plan: Epidural   Post-op Pain Management:    Induction:   Airway Management Planned: Natural Airway  Additional Equipment:   Intra-op Plan:   Post-operative Plan:   Informed Consent: I have reviewed the patients History and Physical, chart, labs and discussed the procedure including the risks, benefits and alternatives for the proposed anesthesia with the patient or authorized representative who has indicated his/her understanding and acceptance.   Dental advisory given  Plan Discussed with: Anesthesiologist  Anesthesia Plan Comments:         Anesthesia Quick Evaluation

## 2012-05-30 NOTE — Progress Notes (Signed)
Jazalyn Mondor is a 21 y.o. G1P0000 at [redacted]w[redacted]d admitted for active labor  Subjective:   Objective: BP 121/67  Pulse 110  Temp 98.5 F (36.9 C) (Oral)  Resp 18  Ht 5\' 2"  (1.575 m)  Wt 85.276 kg (188 lb)  BMI 34.39 kg/m2  SpO2 100%  LMP 08/16/2011      FHT:  FHR: 135 bpm, variability: moderate,  accelerations:  Present,  decelerations:  Absent UC:   irregular SVE:   Dilation: 4.5 Effacement (%): 80 Station: -3;-2 Exam by:: Dr. Aviva Signs  Assessment / Plan: Spontaneous labor with irregular contraction pattern.  Labor: Will start augmentation with pitocin.  Fetal Wellbeing:  Category II Pain Control:  Epidural I/D:  n/a Anticipated MOD:  NSVD  PILOTO, DAYARMYS 05/30/2012, 10:52 AM

## 2012-05-30 NOTE — Progress Notes (Signed)
Meagan [name] is a 21 y.o. G2P0010 at [redacted]w[redacted]d   Subjective: Feeling pressure. Pain controlled on epidural.   Objective: BP 118/95  Pulse 87  Temp 98.2 F (36.8 C) (Oral)  Resp 18  Ht 5\' 2"  (1.575 m)  Wt 85.276 kg (188 lb)  BMI 34.39 kg/m2  SpO2 100%  LMP 08/16/2011   Total I/O In: -  Out: 850 [Urine:850]  FHT:  FHR: 150 bpm, variability: moderate,  accelerations:  Present,  decelerations:  Absent UC:   regular, every 2 minutes SVE:   Dilation: 10 Effacement (%): 90 Station: +1 Exam by:: Dr. Aviva Signs  Assessment / Plan: Augmentation of labor, progressing well. SROM at 14:22 clear.  Had about 10 min of variable decelerations, IUPC attempted but pt was complete. Unable to placed it. Variable decelerations resolved with positioning.   Labor: Progressing on Pitocin. Uterine contractions every 1.5 min. Pit reduced to 1/2 dose. Now pt on 4 and contractions every . Fetal Wellbeing: Category II.  Pain Control: Epidural  I/D: n/a  Anticipated MOD: NSVD  PILOTO, DAYARMYS 05/30/2012, 3:51 PM

## 2012-05-30 NOTE — H&P (Signed)
Meagan [name] is a 21 y.o. female presenting for labor evaluation. Meagan [name] is a 21 y.o. G1P0000 at [redacted]w[redacted]d who presents which contractions.  Pt reports contractions since 4am yesterday. She states that contractions are worse since midnight.  Denies leakage of fluid or vaginal bleeding. Good fetal movement.  Pt has induction schedule for 7PM today.  Maternal Medical History:  Reason for admission: Reason for admission: contractions.  Reason for Admission:   nauseaContractions: Onset was yesterday.   Frequency: regular.   Duration is approximately 5 minutes.   Perceived severity is strong.    Fetal activity: Perceived fetal activity is normal.   Last perceived fetal movement was within the past hour.    Prenatal complications: Infection.   No bleeding, cholelithiasis, HIV, hypertension, IUGR, nephrolithiasis, oligohydramnios, placental abnormality, polyhydramnios, pre-eclampsia, preterm labor, substance abuse, thrombocytopenia or thrombophilia.   Pyelonephrites on daily Keflex  Prenatal Complications - Diabetes: none.    OB History    Grav Para Term Preterm Abortions TAB SAB Ect Mult Living   1 0 0 0 0 0 0 0 0 0      Past Medical History  Diagnosis Date  . Asthma   . Migraine   . Anxiety   . Pregnant state, incidental   . Mental disorder     huffed inhalants "affected brain"  . Kidney stones   . Pyelonephritis complicating pregnancy, antepartum, first trimester 11/05/2011   Past Surgical History  Procedure Date  . No past surgeries    Family History: family history is not on file. Social History:  reports that she quit smoking about 5 months ago. Her smoking use included Cigarettes. She has a 4 pack-year smoking history. She has never used smokeless tobacco. She reports that she does not drink alcohol or use illicit drugs.   Prenatal Transfer Tool  Maternal Diabetes: No Genetic Screening: Normal Maternal Ultrasounds/Referrals: Normal Fetal Ultrasounds  or other Referrals:  None Maternal Substance Abuse:  No Significant Maternal Medications:  Meds include: Other:  Xanax Significant Maternal Lab Results:  Lab values include: Group B Strep negative Other Comments:  None  Review of Systems  Constitutional: Negative for fever and chills.  Eyes: Negative for blurred vision and photophobia.  Gastrointestinal: Negative for heartburn, nausea, vomiting and abdominal pain.  Genitourinary: Negative for dysuria and hematuria.  Neurological: Negative for dizziness and headaches.    Dilation: 4 Effacement (%): 80 Station: -2;-3 Exam by:: M.Topp,RN Blood pressure 137/75, pulse 99, temperature 97.1 F (36.2 C), temperature source Oral, resp. rate 20, last menstrual period 08/16/2011. Maternal Exam:  Uterine Assessment: Contraction strength is firm.  Contraction duration is 90 seconds. Contraction frequency is regular.   Abdomen: Patient reports no abdominal tenderness. Fetal presentation: vertex  Introitus: Normal vulva. Normal vagina.  Ferning test: not done.  Nitrazine test: not done. Amniotic fluid character: not assessed.  Cervix: Cervix evaluated by digital exam.     Fetal Exam Fetal Monitor Review: Mode: ultrasound.   Baseline rate: 125-130.  Variability: moderate (6-25 bpm).   Pattern: accelerations present and no decelerations.    Fetal State Assessment: Category I - tracings are normal.     Physical Exam  Constitutional: She is oriented to person, place, and time. She appears well-developed.  HENT:  Head: Normocephalic.  Neck: Normal range of motion.  Cardiovascular: Normal rate and regular rhythm.   Respiratory: Effort normal and breath sounds normal.  Musculoskeletal: Normal range of motion.  Neurological: She is alert and oriented to person, place, and time.  Skin: Skin is warm and dry.  Psychiatric:       Anxiety    Prenatal labs: ABO, Rh: A/Negative/-- (04/01 0000) Antibody: Negative (04/01 0000) Rubella:  Immune (04/01 0000) RPR: Nonreactive (04/01 0000)  HBsAg: Negative (04/01 0000)  HIV: Non-reactive (04/01 0000)  2H GTT: 72-92-96 GBS: Negative (10/29 0000)   Assessment/Plan: [name] is a 21 y.o. G1P0000 at [redacted]w[redacted]d who presents with contractions.  Pt is in active labor. GBS negative  Plan: admission Pt may have epidural Expectant management   Governor Specking 05/30/2012, 5:30 AM  I have seen this patient and agree with the above resident's note.  LEFTWICH-KIRBY, Germaine Ripp Certified Nurse-Midwife

## 2012-05-30 NOTE — Anesthesia Procedure Notes (Signed)
Epidural Patient location during procedure: OB Start time: 05/30/2012 8:14 AM  Staffing Anesthesiologist: Tajuana Kniskern A. Performed by: anesthesiologist   Preanesthetic Checklist Completed: patient identified, site marked, surgical consent, pre-op evaluation, timeout performed, IV checked, risks and benefits discussed and monitors and equipment checked  Epidural Patient position: sitting Prep: site prepped and draped and DuraPrep Patient monitoring: continuous pulse ox and blood pressure Approach: midline Injection technique: LOR air  Needle:  Needle type: Tuohy  Needle gauge: 17 G Needle length: 9 cm and 9 Needle insertion depth: 5 cm cm Catheter type: closed end flexible Catheter size: 19 Gauge Catheter at skin depth: 10 cm Test dose: negative and Other  Assessment Events: blood not aspirated, injection not painful, no injection resistance, negative IV test and no paresthesia  Additional Notes Patient identified. Risks and benefits discussed including failed block, incomplete  Pain control, post dural puncture headache, nerve damage, paralysis, blood pressure Changes, nausea, vomiting, reactions to medications-both toxic and allergic and post Partum back pain. All questions were answered. Patient expressed understanding and wished to proceed. Sterile technique was used throughout procedure. Epidural site was Dressed with sterile barrier dressing. No paresthesias, signs of intravascular injection Or signs of intrathecal spread were encountered.  Patient was more comfortable after the epidural was dosed. Please see RN's note for documentation of vital signs and FHR which are stable.

## 2012-05-30 NOTE — MAU Note (Signed)
Contractions since 4am yesterday morning. Contractions became more intense around midnight. Small amount of vaginal bleeding.

## 2012-05-30 NOTE — Progress Notes (Signed)
Pt laboring down. Comfortable on epidural.  Dilation: 10 Effacement (%): 100 Cervical Position: Anterior Station: +1 Presentation: Vertex Exam by:: Dr. Aviva Signs

## 2012-05-31 DIAGNOSIS — O1490 Unspecified pre-eclampsia, unspecified trimester: Secondary | ICD-10-CM

## 2012-05-31 LAB — CBC
HCT: 32.8 % — ABNORMAL LOW (ref 36.0–46.0)
Hemoglobin: 10.8 g/dL — ABNORMAL LOW (ref 12.0–15.0)
MCH: 30.2 pg (ref 26.0–34.0)
MCHC: 32.9 g/dL (ref 30.0–36.0)
MCV: 91.6 fL (ref 78.0–100.0)
Platelets: 175 10*3/uL (ref 150–400)
RBC: 3.58 MIL/uL — ABNORMAL LOW (ref 3.87–5.11)
RDW: 13.7 % (ref 11.5–15.5)
WBC: 14.6 10*3/uL — ABNORMAL HIGH (ref 4.0–10.5)

## 2012-05-31 NOTE — MAU Provider Note (Signed)
See H& P.

## 2012-05-31 NOTE — Progress Notes (Signed)
Discussed negative risks of breastfeeding with xanax use for baby. Pt willing to use bottle, which was her original choice, but unsure if she wants to pump and dump in order to preserve her milk supply. Prior RN encouraged her to breastfeed but lactation recommended that Pt. not do so at this point due to medications risks to baby. Extensive education done with family and patient regarding plan of care. Pt is still very sleepy and a poor bottle feeder at this point. Instructed Pt to try every 3 hours and that baby is still <24 hours old. Baby is also very spitty and has had frequent spit ups. Pt very anxious about situation and unsure if she wants to pump right now consistently and if that will fit with her lifestyle. Also not sure she can wean off her xanax right now or soon. Will monitor and follow.

## 2012-05-31 NOTE — H&P (Signed)
I examined pt and agree with documentation above and resident plan of care. MUHAMMAD,WALIDAH  

## 2012-05-31 NOTE — H&P (Signed)
Attestation of Attending Supervision of Advanced Practitioner (CNM/NP): Evaluation and management procedures were performed by the Advanced Practitioner under my supervision and collaboration.  I have reviewed the Advanced Practitioner's note and chart, and I agree with the management and plan.  Mikaella Escalona 05/31/2012 9:07 AM

## 2012-05-31 NOTE — H&P (Signed)
Post Partum Day 1 Subjective: no complaints, up ad lib, voiding and tolerating PO  Objective: Blood pressure 120/66, pulse 78, temperature 98.9 F (37.2 C), temperature source Oral, resp. rate 18, height 5\' 2"  (1.575 m), weight 85.276 kg (188 lb), last menstrual period 08/16/2011, SpO2 97.00%, unknown if currently breastfeeding.  Physical Exam:  General: alert, cooperative and no distress, appears very tired Lochia: appropriate Uterine Fundus: firm DVT Evaluation: No evidence of DVT seen on physical exam.   Basename 05/31/12 0630 05/30/12 0519  HGB 10.8* 12.3  HCT 32.8* 36.8    Assessment/Plan: Plan for discharge tomorrow, Breastfeeding and bottle-feeding. Declines circ. Contraception undecided.   LOS: 1 day   Meagan [name], Cristal Deer 05/31/2012, 7:39 AM

## 2012-05-31 NOTE — Anesthesia Postprocedure Evaluation (Signed)
  Anesthesia Post-op Note  Patient: Meagan [name]  Procedure(s) Performed: * No procedures listed *  Patient Location: Mother/Baby  Anesthesia Type:Epidural  Level of Consciousness: awake, alert  and oriented  Airway and Oxygen Therapy: Patient Spontanous Breathing  Post-op Pain: mild  Post-op Assessment: Patient's Cardiovascular Status Stable, Respiratory Function Stable, Patent Airway, No signs of Nausea or vomiting and Pain level controlled  Post-op Vital Signs: stable  Complications: No apparent anesthesia complications

## 2012-06-01 MED ORDER — RHO D IMMUNE GLOBULIN 1500 UNIT/2ML IJ SOLN
300.0000 ug | Freq: Once | INTRAMUSCULAR | Status: AC
  Administered 2012-06-01: 300 ug via INTRAMUSCULAR
  Filled 2012-06-01: qty 2

## 2012-06-01 MED ORDER — IBUPROFEN 600 MG PO TABS: 600.0000 mg | ORAL_TABLET | Freq: Four times a day (QID) | ORAL | Status: DC | PRN

## 2012-06-01 NOTE — Clinical Social Work Note (Signed)
CSW consulted with [name].  No barriers to discharge at this time.  Full consult report to follow.    [phone] 

## 2012-06-01 NOTE — Discharge Summary (Signed)
Obstetric Discharge Summary Reason for Admission: onset of labor Prenatal Procedures: ultrasound Intrapartum Procedures: spontaneous vaginal delivery Postpartum Procedures: none Complications-Operative and Postpartum: 1st degree perineal laceration  Eating, drinking, voiding well. +flatus, feels like she is constipated.  Lochia and pain wnl.    Hemoglobin  Date Value Range Status  05/31/2012 10.8* 12.0 - 15.0 g/dL Final     HCT  Date Value Range Status  05/31/2012 32.8* 36.0 - 46.0 % Final    Physical Exam:  General: alert, cooperative and no distress Lochia: appropriate Uterine Fundus: firm Incision: n/a DVT Evaluation: No evidence of DVT seen on physical exam. Negative Homan's sign. No cords or calf tenderness. No significant calf/ankle edema.  Discharge Diagnoses: Term Pregnancy-delivered  Discharge Information: Date: 06/01/2012 Activity: pelvic rest Diet: routine Medications: PNV and Ibuprofen, stool softener, miralax if needed Condition: stable Instructions: refer to practice specific booklet Discharge to: home Follow-up Information    Schedule an appointment as soon as possible for a visit with [facility].   Contact information:   [address] [place] [address] [phone]         Newborn Data: Live born female  Birth Weight: 7 lb 14 oz (3572 g) APGAR: 8, 9  Home with mother. Bottlefeeding, nuvaring for contraception.  OP circ.  Marge Duncans 06/01/2012, 7:59 AM

## 2012-06-02 LAB — RH IG WORKUP (INCLUDES ABO/RH)
ABO/RH(D): A NEG
Antibody Screen: POSITIVE
DAT, IgG: NEGATIVE
Fetal Screen: NEGATIVE
Gestational Age(Wks): 40
Unit division: 0

## 2012-06-02 NOTE — Discharge Summary (Signed)
Attestation of Attending Supervision of Advanced Practitioner (CNM/NP): Evaluation and management procedures were performed by the Advanced Practitioner under my supervision and collaboration.  I have reviewed the Advanced Practitioner's note and chart, and I agree with the management and plan.  Camille Thau 06/02/2012 12:36 PM

## 2012-06-02 NOTE — Progress Notes (Signed)
Post discharge chart review completed.  

## 2012-07-12 ENCOUNTER — Emergency Department (HOSPITAL_COMMUNITY)
Admission: EM | Admit: 2012-07-12 | Discharge: 2012-07-12 | Disposition: A | Discharge status: Home / Self Care | Payer: Medicaid Other | Attending: Emergency Medicine | Admitting: Emergency Medicine

## 2012-07-12 ENCOUNTER — Encounter (HOSPITAL_COMMUNITY): Payer: Self-pay | Admitting: *Deleted

## 2012-07-12 DIAGNOSIS — J3489 Other specified disorders of nose and nasal sinuses: Secondary | ICD-10-CM | POA: Insufficient documentation

## 2012-07-12 DIAGNOSIS — G43909 Migraine, unspecified, not intractable, without status migrainosus: Secondary | ICD-10-CM

## 2012-07-12 DIAGNOSIS — Z3202 Encounter for pregnancy test, result negative: Secondary | ICD-10-CM | POA: Insufficient documentation

## 2012-07-12 DIAGNOSIS — Z87442 Personal history of urinary calculi: Secondary | ICD-10-CM | POA: Insufficient documentation

## 2012-07-12 DIAGNOSIS — N390 Urinary tract infection, site not specified: Secondary | ICD-10-CM

## 2012-07-12 DIAGNOSIS — R110 Nausea: Secondary | ICD-10-CM | POA: Insufficient documentation

## 2012-07-12 DIAGNOSIS — R109 Unspecified abdominal pain: Secondary | ICD-10-CM | POA: Insufficient documentation

## 2012-07-12 DIAGNOSIS — J45909 Unspecified asthma, uncomplicated: Secondary | ICD-10-CM | POA: Insufficient documentation

## 2012-07-12 DIAGNOSIS — H53149 Visual discomfort, unspecified: Secondary | ICD-10-CM | POA: Insufficient documentation

## 2012-07-12 DIAGNOSIS — Z87891 Personal history of nicotine dependence: Secondary | ICD-10-CM | POA: Insufficient documentation

## 2012-07-12 DIAGNOSIS — Z8659 Personal history of other mental and behavioral disorders: Secondary | ICD-10-CM | POA: Insufficient documentation

## 2012-07-12 DIAGNOSIS — Z79899 Other long term (current) drug therapy: Secondary | ICD-10-CM | POA: Insufficient documentation

## 2012-07-12 LAB — URINALYSIS, ROUTINE W REFLEX MICROSCOPIC
Bilirubin Urine: NEGATIVE
Glucose, UA: NEGATIVE mg/dL
Ketones, ur: NEGATIVE mg/dL
Leukocytes, UA: NEGATIVE
Nitrite: NEGATIVE
Protein, ur: NEGATIVE mg/dL
Specific Gravity, Urine: 1.02 (ref 1.005–1.030)
Urobilinogen, UA: 0.2 mg/dL (ref 0.0–1.0)
pH: 6 (ref 5.0–8.0)

## 2012-07-12 LAB — PREGNANCY, URINE: Preg Test, Ur: NEGATIVE

## 2012-07-12 LAB — URINE MICROSCOPIC-ADD ON

## 2012-07-12 MED ORDER — BUTALBITAL-APAP-CAFFEINE 50-325-40 MG PO TABS
1.0000 | ORAL_TABLET | Freq: Once | ORAL | Status: AC
  Administered 2012-07-12: 1 via ORAL
  Filled 2012-07-12: qty 1

## 2012-07-12 MED ORDER — ONDANSETRON 8 MG PO TBDP
8.0000 mg | ORAL_TABLET | Freq: Once | ORAL | Status: AC
  Administered 2012-07-12: 8 mg via ORAL
  Filled 2012-07-12: qty 1

## 2012-07-12 MED ORDER — BUTALBITAL-APAP-CAFFEINE 50-325-40 MG PO TABS: 1.0000 | ORAL_TABLET | Freq: Four times a day (QID) | ORAL | Status: DC | PRN

## 2012-07-12 MED ORDER — CEPHALEXIN 500 MG PO CAPS: 500.0000 mg | ORAL_CAPSULE | Freq: Four times a day (QID) | ORAL | Status: DC

## 2012-07-12 MED ORDER — ONDANSETRON HCL 4 MG PO TABS: 4.0000 mg | ORAL_TABLET | Freq: Four times a day (QID) | ORAL | Status: DC

## 2012-07-12 NOTE — ED Provider Notes (Signed)
History     CSN: [id number]  Arrival date & time 07/12/12  4098   First MD Initiated Contact with Patient 07/12/12 1927      Chief Complaint  Patient presents with  . Headache    (Consider location/radiation/quality/duration/timing/severity/associated sxs/prior treatment) HPI Comments: Patient with multiple complaints, c/o frontal headache, sinus pressure and nasal congestion, left flank pain and persistent nausea.  States she developed the headache 4 days ago and it has been waxing and waning in severity.  Also c/o pain to her left flank area  For several days.  She denies burning with urination or hematuria, but states that she urinates frequently.  She also c/o intermittent nausea but denies vomiting, abd pain, fever, vaginal bleeding, discharge or possible pregnancy.     Patient is a 22 y.o. female presenting with headaches. The history is provided by the patient.  Headache  This is a new problem. The current episode started more than 2 days ago. Episode frequency: waxing and waning. The problem has not changed since onset.The headache is associated with coughing (blowing her nose). The pain is located in the frontal region. The quality of the pain is described as throbbing. The pain is moderate. The pain does not radiate. Associated symptoms include malaise/fatigue and nausea. Pertinent negatives include no fever, no chest pressure, no near-syncope, no palpitations, no syncope, no shortness of breath and no vomiting. Associated symptoms comments: Nasal congestion, sinus drainage, left flank pain. She has tried nothing for the symptoms. The treatment provided no relief.    Past Medical History  Diagnosis Date  . Asthma   . Migraine   . Anxiety   . Mental disorder     huffed inhalants "affected brain"  . Kidney stones   . Pyelonephritis complicating pregnancy, antepartum, first trimester 11/05/2011    Past Surgical History  Procedure Date  . No past surgeries     No family  history on file.  History  Substance Use Topics  . Smoking status: Former Smoker -- 1.0 packs/day for 4 years    Types: Cigarettes    Quit date: 12/03/2011  . Smokeless tobacco: Never Used  . Alcohol Use: No    OB History    Grav Para Term Preterm Abortions TAB SAB Ect Mult Living   2 1 1  0 1 0 1 0 0 1      Review of Systems  Constitutional: Positive for malaise/fatigue. Negative for fever, activity change and appetite change.  HENT: Positive for congestion and sinus pressure. Negative for sore throat, facial swelling, trouble swallowing, neck pain and neck stiffness.   Eyes: Positive for photophobia. Negative for pain and visual disturbance.  Respiratory: Negative for shortness of breath.   Cardiovascular: Negative for chest pain, palpitations, syncope and near-syncope.  Gastrointestinal: Positive for nausea. Negative for vomiting and abdominal pain.  Genitourinary: Positive for frequency and flank pain. Negative for urgency, hematuria, decreased urine volume, vaginal bleeding, vaginal discharge, difficulty urinating, genital sores and pelvic pain.  Skin: Negative for rash and wound.  Neurological: Positive for headaches. Negative for dizziness, facial asymmetry, speech difficulty, weakness and numbness.  Psychiatric/Behavioral: Negative for confusion and decreased concentration.  All other systems reviewed and are negative.    Allergies  Codeine and Penicillins  Home Medications   Current Outpatient Rx  Name  Route  Sig  Dispense  Refill  . ALPRAZOLAM 0.5 MG PO TABS   Oral   Take 0.5 mg by mouth 2 (two) times daily as needed. anxiety         .  ALBUTEROL SULFATE HFA 108 (90 BASE) MCG/ACT IN AERS   Inhalation   Inhale 2 puffs into the lungs every 4 (four) hours as needed. For coughing           BP 137/74  Pulse 108  Temp 98.1 F (36.7 C)  Resp 20  Ht 5\' 2"  (1.575 m)  Wt 165 lb (74.844 kg)  BMI 30.18 kg/m2  SpO2 100%  LMP 07/06/2012  Breastfeeding?  No  Physical Exam  Nursing note and vitals reviewed. Constitutional: She is oriented to person, place, and time. She appears well-developed and well-nourished. No distress.  HENT:  Head: Normocephalic and atraumatic.  Mouth/Throat: Oropharynx is clear and moist.  Eyes: EOM are normal. Pupils are equal, round, and reactive to light.  Neck: Normal range of motion and phonation normal. Neck supple. No rigidity. No Brudzinski's sign and no Kernig's sign noted.  Cardiovascular: Normal rate, regular rhythm, normal heart sounds and intact distal pulses.   No murmur heard. Pulmonary/Chest: Effort normal and breath sounds normal.  Abdominal: Soft. She exhibits no distension and no mass. There is no tenderness. There is no rigidity, no rebound, no guarding and no tenderness at McBurney's point.       C/o pain to the left flank, no CVA tenderness present on exam.  No edema or discoloration  Musculoskeletal: Normal range of motion.  Neurological: She is alert and oriented to person, place, and time. No cranial nerve deficit or sensory deficit. She exhibits normal muscle tone. Coordination and gait normal.  Reflex Scores:      Tricep reflexes are 2+ on the right side and 2+ on the left side.      Bicep reflexes are 2+ on the right side and 2+ on the left side. Skin: Skin is warm and dry.    ED Course  Procedures (including critical care time)  Results for orders placed during the hospital encounter of 07/12/12  PREGNANCY, URINE      Component Value Range   Preg Test, Ur NEGATIVE  NEGATIVE  URINALYSIS, ROUTINE W REFLEX MICROSCOPIC      Component Value Range   Color, Urine YELLOW  YELLOW   APPearance CLEAR  CLEAR   Specific Gravity, Urine 1.020  1.005 - 1.030   pH 6.0  5.0 - 8.0   Glucose, UA NEGATIVE  NEGATIVE mg/dL   Hgb urine dipstick MODERATE (*) NEGATIVE   Bilirubin Urine NEGATIVE  NEGATIVE   Ketones, ur NEGATIVE  NEGATIVE mg/dL   Protein, ur NEGATIVE  NEGATIVE mg/dL   Urobilinogen, UA  0.2  0.0 - 1.0 mg/dL   Nitrite NEGATIVE  NEGATIVE   Leukocytes, UA NEGATIVE  NEGATIVE  URINE MICROSCOPIC-ADD ON      Component Value Range   Squamous Epithelial / LPF FEW (*) RARE   WBC, UA 3-6  <3 WBC/hpf   RBC / HPF 7-10  <3 RBC/hpf      Urine culture pending   MDM    Pt is alert, non-toxic appearing.  abd is soft , NT.  No guarding or rebound tenderness.  Pain to left flank w/o CVA tenderness.  No fever, vomiting.  Possible early pyelonephritis , so will treat with abx.  Pt agrees to return here or f/u with her PMD.      Prescribed: fiorcet #[id number] Keflex zofran      Christalyn Goertz L. Carl, [place] 07/15/12 1747

## 2012-07-12 NOTE — ED Notes (Signed)
1. Headache x 4 days 2. Lower left sided back pain (no known injury) no uti symptoms 3. Mucus stuck in throat x 2-3 months 4. Nausea x 2 wks

## 2012-07-14 LAB — URINE CULTURE
Colony Count: NO GROWTH
Culture: NO GROWTH

## 2012-07-26 ENCOUNTER — Encounter (HOSPITAL_COMMUNITY): Payer: Self-pay | Admitting: Emergency Medicine

## 2012-07-26 ENCOUNTER — Emergency Department (INDEPENDENT_AMBULATORY_CARE_PROVIDER_SITE_OTHER)
Admission: EM | Admit: 2012-07-26 | Discharge: 2012-07-26 | Disposition: A | Discharge status: Home / Self Care | Payer: Medicaid Other | Source: Home / Self Care | Attending: Family Medicine | Admitting: Family Medicine

## 2012-07-26 ENCOUNTER — Emergency Department (INDEPENDENT_AMBULATORY_CARE_PROVIDER_SITE_OTHER): Payer: Medicaid Other

## 2012-07-26 DIAGNOSIS — F411 Generalized anxiety disorder: Secondary | ICD-10-CM

## 2012-07-26 DIAGNOSIS — F419 Anxiety disorder, unspecified: Secondary | ICD-10-CM

## 2012-07-26 LAB — POCT URINALYSIS DIP (DEVICE)
Bilirubin Urine: NEGATIVE
Glucose, UA: NEGATIVE mg/dL
Hgb urine dipstick: NEGATIVE
Ketones, ur: NEGATIVE mg/dL
Leukocytes, UA: NEGATIVE
Nitrite: NEGATIVE
Protein, ur: NEGATIVE mg/dL
Specific Gravity, Urine: 1.02 (ref 1.005–1.030)
Urobilinogen, UA: 0.2 mg/dL (ref 0.0–1.0)
pH: 7 (ref 5.0–8.0)

## 2012-07-26 LAB — POCT PREGNANCY, URINE: Preg Test, Ur: NEGATIVE

## 2012-07-26 NOTE — ED Provider Notes (Signed)
History/physical exam/procedure(s) were performed by non-physician practitioner and as supervising physician I was immediately available for consultation/collaboration. I have reviewed all notes and am in agreement with care and plan.   Hilario Quarry, MD 07/26/12 [phone]

## 2012-07-26 NOTE — ED Notes (Signed)
Waiting discharge papers 

## 2012-07-26 NOTE — ED Notes (Signed)
Pt c/o stomach irritation. States she was told that she had a stomach ulcers by urgent care in [place]. X 2 months ago.  Also c/o "somthing stuck in throat" x 1 month. Pt states that it feels like a big ball of mucus.   Pt has a hx of anxiety. Pt is very nervous and has taken xanax and doesn't feel like it is working.

## 2012-07-26 NOTE — ED Provider Notes (Signed)
History     CSN: [id number]  Arrival date & time 07/26/12  1339   First MD Initiated Contact with Patient 07/26/12 1421      Chief Complaint  Patient presents with  . Abdominal Pain    pt was seen 2 months ago at urgent care in [place] and tols she has stomach ulcers. pt is c/o of irritation of her stomach and pain.  . Oral Swelling    pt states "feels like something stuck in throat" x 1 month.     (Consider location/radiation/quality/duration/timing/severity/associated sxs/prior treatment) HPI Comments: Pt has multiple complaints.  Pt has a cough and congestion.  Pt reports she feels like she has a plug of mucous stuck in her throat.   Pt thinks she has a urinary tract infection.   Pt complains of abdominal pain. Pt reports she was told that she could have an ulcer last month.  Pt also reports anxiety.   Pt took a xanax earlier today  The history is provided by the patient. No language interpreter was used.    Past Medical History  Diagnosis Date  . Asthma   . Migraine   . Anxiety   . Mental disorder     huffed inhalants "affected brain"  . Kidney stones   . Pyelonephritis complicating pregnancy, antepartum, first trimester 11/05/2011    Past Surgical History  Procedure Date  . No past surgeries     History reviewed. No pertinent family history.  History  Substance Use Topics  . Smoking status: Current Every Day Smoker -- 1.0 packs/day for 4 years    Types: Cigarettes  . Smokeless tobacco: Never Used  . Alcohol Use: No    OB History    Grav Para Term Preterm Abortions TAB SAB Ect Mult Living   2 1 1  0 1 0 1 0 0 1      Review of Systems  Respiratory: Positive for cough.   Gastrointestinal: Positive for abdominal pain.  Genitourinary: Positive for urgency and frequency.  All other systems reviewed and are negative.    Allergies  Codeine and Penicillins  Home Medications   Current Outpatient Rx  Name  Route  Sig  Dispense  Refill  . ALPRAZOLAM  0.5 MG PO TABS   Oral   Take 0.5 mg by mouth 2 (two) times daily as needed. anxiety         . BUTALBITAL-APAP-CAFFEINE 50-325-40 MG PO TABS   Oral   Take 1-2 tablets by mouth every 6 (six) hours as needed for headache.   15 tablet   0   . ALBUTEROL SULFATE HFA 108 (90 BASE) MCG/ACT IN AERS   Inhalation   Inhale 2 puffs into the lungs every 4 (four) hours as needed. For coughing         . CEPHALEXIN 500 MG PO CAPS   Oral   Take 1 capsule (500 mg total) by mouth 4 (four) times daily. For 7 days   28 capsule   0   . ONDANSETRON HCL 4 MG PO TABS   Oral   Take 1 tablet (4 mg total) by mouth every 6 (six) hours.   10 tablet   0     BP 116/74  Pulse 97  Temp 97.8 F (36.6 C) (Oral)  Resp 22  SpO2 97%  LMP 07/06/2012  Breastfeeding? No  Physical Exam  Nursing note and vitals reviewed. Constitutional: She appears well-developed and well-nourished.  HENT:  Head: Normocephalic.  Right Ear: External  ear normal.  Left Ear: External ear normal.  Nose: Nose normal.  Mouth/Throat: Oropharynx is clear and moist.  Eyes: Pupils are equal, round, and reactive to light.  Neck: Normal range of motion.  Cardiovascular: Normal rate and normal heart sounds.   Pulmonary/Chest: Effort normal and breath sounds normal.  Abdominal: Soft. Bowel sounds are normal.  Musculoskeletal: Normal range of motion.  Neurological: She is alert.  Skin: Skin is warm.  Psychiatric:       anxious    ED Course  Procedures (including critical care time)   Labs Reviewed  POCT URINALYSIS DIP (DEVICE)  POCT PREGNANCY, URINE   Dg Chest 2 View  07/26/2012  *RADIOLOGY REPORT*  Clinical Data:  Cough.  CHEST - 2 VIEW  Comparison: 10/02/2010  Findings: The heart size and mediastinal contours are within normal limits.  Both lungs are clear.  The visualized skeletal structures are unremarkable.  IMPRESSION: No active disease.   Original Report Authenticated By: Irish Lack, M.D.      No diagnosis  found.    MDM  I suspect all symptoma are anxiety.  No uti,  Chest xray is normal.   Pt advised to follow up with her Physicain this week. Pt has a counselor she has been seeing for anxiety         Elson Areas, [place] 07/26/12 1700

## 2012-07-28 NOTE — ED Provider Notes (Signed)
Medical screening examination/treatment/procedure(s) were performed by resident physician or non-physician practitioner and as supervising physician I was immediately available for consultation/collaboration.   KINDL,JAMES DOUGLAS MD.    James D Kindl, MD 07/28/12 1557 

## 2012-09-02 ENCOUNTER — Encounter (HOSPITAL_COMMUNITY): Payer: Self-pay | Admitting: *Deleted

## 2012-09-02 ENCOUNTER — Emergency Department (HOSPITAL_COMMUNITY): Payer: Medicaid Other

## 2012-09-02 ENCOUNTER — Emergency Department (HOSPITAL_COMMUNITY)
Admission: EM | Admit: 2012-09-02 | Discharge: 2012-09-02 | Disposition: A | Discharge status: Home / Self Care | Payer: Medicaid Other | Attending: Emergency Medicine | Admitting: Emergency Medicine

## 2012-09-02 DIAGNOSIS — R091 Pleurisy: Secondary | ICD-10-CM | POA: Insufficient documentation

## 2012-09-02 DIAGNOSIS — Z79899 Other long term (current) drug therapy: Secondary | ICD-10-CM | POA: Insufficient documentation

## 2012-09-02 DIAGNOSIS — Z8719 Personal history of other diseases of the digestive system: Secondary | ICD-10-CM | POA: Insufficient documentation

## 2012-09-02 DIAGNOSIS — F17200 Nicotine dependence, unspecified, uncomplicated: Secondary | ICD-10-CM | POA: Insufficient documentation

## 2012-09-02 DIAGNOSIS — J45909 Unspecified asthma, uncomplicated: Secondary | ICD-10-CM | POA: Insufficient documentation

## 2012-09-02 DIAGNOSIS — F411 Generalized anxiety disorder: Secondary | ICD-10-CM | POA: Insufficient documentation

## 2012-09-02 DIAGNOSIS — Z8742 Personal history of other diseases of the female genital tract: Secondary | ICD-10-CM | POA: Insufficient documentation

## 2012-09-02 DIAGNOSIS — Z87442 Personal history of urinary calculi: Secondary | ICD-10-CM | POA: Insufficient documentation

## 2012-09-02 DIAGNOSIS — Z8659 Personal history of other mental and behavioral disorders: Secondary | ICD-10-CM | POA: Insufficient documentation

## 2012-09-02 DIAGNOSIS — Z8679 Personal history of other diseases of the circulatory system: Secondary | ICD-10-CM | POA: Insufficient documentation

## 2012-09-02 HISTORY — DX: Gastric ulcer, unspecified as acute or chronic, without hemorrhage or perforation: K25.9

## 2012-09-02 LAB — CBC
HCT: 41.1 % (ref 36.0–46.0)
Hemoglobin: 14.4 g/dL (ref 12.0–15.0)
MCH: 30.6 pg (ref 26.0–34.0)
MCHC: 35 g/dL (ref 30.0–36.0)
MCV: 87.4 fL (ref 78.0–100.0)
Platelets: 223 10*3/uL (ref 150–400)
RBC: 4.7 MIL/uL (ref 3.87–5.11)
RDW: 13.3 % (ref 11.5–15.5)
WBC: 6.1 10*3/uL (ref 4.0–10.5)

## 2012-09-02 LAB — COMPREHENSIVE METABOLIC PANEL
ALT: 14 U/L (ref 0–35)
AST: 9 U/L (ref 0–37)
Albumin: 3.6 g/dL (ref 3.5–5.2)
Alkaline Phosphatase: 73 U/L (ref 39–117)
BUN: 12 mg/dL (ref 6–23)
CO2: 25 mEq/L (ref 19–32)
Calcium: 9.7 mg/dL (ref 8.4–10.5)
Chloride: 102 mEq/L (ref 96–112)
Creatinine, Ser: 0.71 mg/dL (ref 0.50–1.10)
GFR calc Af Amer: >90 mL/min (ref >90)
GFR calc non Af Amer: >90 mL/min (ref >90)
Glucose, Bld: 92 mg/dL (ref 70–99)
Potassium: 4.2 mEq/L (ref 3.5–5.1)
Sodium: 136 mEq/L (ref 135–145)
Total Bilirubin: 0.2 mg/dL — ABNORMAL LOW (ref 0.3–1.2)
Total Protein: 6.7 g/dL (ref 6.0–8.3)

## 2012-09-02 LAB — POCT I-STAT TROPONIN I: Troponin i, poc: 0 ng/mL (ref 0.00–0.08)

## 2012-09-02 MED ORDER — NICOTINE 14 MG/24HR TD PT24: 1.0000 | MEDICATED_PATCH | TRANSDERMAL | Status: DC

## 2012-09-02 MED ORDER — IBUPROFEN 400 MG PO TABS: 600.0000 mg | ORAL_TABLET | Freq: Once | ORAL | Status: DC

## 2012-09-02 MED ORDER — SUCRALFATE 1 G PO TABS: 1.0000 g | ORAL_TABLET | Freq: Four times a day (QID) | ORAL | Status: DC

## 2012-09-02 NOTE — ED Notes (Addendum)
Pt to ED c/o pain behind both scapula, palpitations occasional sob x 1 week.  Pt with normal vag delivery in Nov.  Pt states pain increasing when she lays on her L sided and reaches across her chest.  Also feels like her throat is closing in.

## 2012-09-02 NOTE — ED Notes (Signed)
Dr Juleen China at bedside with pt

## 2012-09-02 NOTE — ED Notes (Signed)
Pt alert and mentating appropriately upon d/c teaching given; pt given d/c teaching and prescriptions; pt verbalizes understanding of d/c teaching and prescriptions; pt has no further questions upon d/c. Pt does not show signs of acute distress upon d/c.

## 2012-09-02 NOTE — ED Notes (Signed)
Pt states that she was told she had an ulcer about 7-8 months ago; pt states she can not take ibuprofen or anything with NSAIDs; pt states pain went away after she had baby but has returned 2-3 weeks after baby was delivered; pt denies injury to shoulder blades; pt alert and mentating appropriately.

## 2012-09-04 NOTE — ED Provider Notes (Signed)
History    22 year old female with intermittent chest pain which radiates to between her scapula. Onset about a week ago. Stress pain as sharp. Oftentimes worse with deep breaths and coughing. For shortness of breath. No unusual leg pain or swelling. Denies past history of DVT or pulmonary embolism. She has a past history of asthma, and states that her current symptoms feel different. She is a smoker. Also c/o intermittent epigastric pain which she attributes to an ucler. Takes prilosec for this.    CSN: [id number]  Arrival date & time 09/02/12  1138   First MD Initiated Contact with Patient 09/02/12 1225      Chief Complaint  Patient presents with  . Chest Pain    (Consider location/radiation/quality/duration/timing/severity/associated sxs/prior treatment) HPI  Past Medical History  Diagnosis Date  . Asthma   . Migraine   . Anxiety   . Mental disorder     huffed inhalants "affected brain"  . Kidney stones   . Pyelonephritis complicating pregnancy, antepartum, first trimester 11/05/2011  . Stomach ulcer     Past Surgical History  Procedure Laterality Date  . No past surgeries      No family history on file.  History  Substance Use Topics  . Smoking status: Current Every Day Smoker -- 1.00 packs/day for 4 years    Types: Cigarettes  . Smokeless tobacco: Never Used  . Alcohol Use: No    OB History   Grav Para Term Preterm Abortions TAB SAB Ect Mult Living   2 1 1  0 1 0 1 0 0 1      Review of Systems  All systems reviewed and negative, other than as noted in HPI.   Allergies  Codeine and Penicillins  Home Medications   Current Outpatient Rx  Name  Route  Sig  Dispense  Refill  . albuterol (PROVENTIL HFA;VENTOLIN HFA) 108 (90 BASE) MCG/ACT inhaler   Inhalation   Inhale 2 puffs into the lungs every 4 (four) hours as needed. For coughing         . ALPRAZolam (XANAX) 0.5 MG tablet   Oral   Take 0.5 mg by mouth 2 (two) times daily as needed. anxiety          . butalbital-acetaminophen-caffeine (FIORICET) 50-325-40 MG per tablet   Oral   Take 1-2 tablets by mouth every 6 (six) hours as needed for headache.   15 tablet   0   . omeprazole (PRILOSEC) 40 MG capsule   Oral   Take 40 mg by mouth daily.         . nicotine (EQ NICOTINE) 14 mg/24hr patch   Transdermal   Place 1 patch onto the skin daily.   21 patch   0   . sucralfate (CARAFATE) 1 G tablet   Oral   Take 1 tablet (1 g total) by mouth 4 (four) times daily.   30 tablet   0     30 minutes before meals     BP 109/58  Pulse 71  Temp(Src) 98.3 F (36.8 C) (Oral)  Resp 19  Ht 5\' 2"  (1.575 m)  SpO2 100%  LMP 08/07/2012  Physical Exam  Nursing note and vitals reviewed. Constitutional: She appears well-developed and well-nourished. No distress.  HENT:  Head: Normocephalic and atraumatic.  Eyes: Conjunctivae are normal. Right eye exhibits no discharge. Left eye exhibits no discharge.  Neck: Neck supple.  Cardiovascular: Normal rate, regular rhythm and normal heart sounds.  Exam reveals no gallop and  no friction rub.   No murmur heard. Pulmonary/Chest: Effort normal and breath sounds normal. No respiratory distress. She exhibits no tenderness.  Abdominal: Soft. She exhibits no distension. There is no tenderness.  Musculoskeletal: She exhibits no edema and no tenderness.  Lower extremities symmetric as compared to each other. No calf tenderness. Negative Homan's. No palpable cords.   Neurological: She is alert.  Skin: Skin is warm and dry.  Psychiatric: She has a normal mood and affect. Her behavior is normal. Thought content normal.    ED Course  Procedures (including critical care time)  Labs Reviewed  COMPREHENSIVE METABOLIC PANEL - Abnormal; Notable for the following:    Total Bilirubin 0.2 (*)    All other components within normal limits  CBC  POCT I-STAT TROPONIN I   No results found.  Dg Chest 2 View  09/02/2012  *RADIOLOGY REPORT*  Clinical  Data: Pain.  CHEST - 2 VIEW  Comparison: Chest x-ray 07/26/2012.  Findings: Lung volumes are normal.  No consolidative airspace disease.  No pleural effusions.  No pneumothorax.  No pulmonary nodule or mass noted.  Pulmonary vasculature and the cardiomediastinal silhouette are within normal limits.  IMPRESSION: 1. No radiographic evidence of acute cardiopulmonary disease.   Original Report Authenticated By: Trudie Reed, M.D.    EKG:  Rhythm: Normal sinus rhythm Vent. rate 98 BPM PR interval 116 ms QRS duration 72 ms QT/QTc 354/451 ms ST segments: Nonspecific ST changes. There is T-wave flattening in lead 3 and aVF as well as the lateral precordial lead.  Comparison: None   Dg Chest 2 View  09/02/2012  *RADIOLOGY REPORT*  Clinical Data: Pain.  CHEST - 2 VIEW  Comparison: Chest x-ray 07/26/2012.  Findings: Lung volumes are normal.  No consolidative airspace disease.  No pleural effusions.  No pneumothorax.  No pulmonary nodule or mass noted.  Pulmonary vasculature and the cardiomediastinal silhouette are within normal limits.  IMPRESSION: 1. No radiographic evidence of acute cardiopulmonary disease.   Original Report Authenticated By: Trudie Reed, M.D.     1. Pleurisy without effusion       MDM  22 year old female with pleuritic chest pain. Exam is unremarkable. Chest x-ray is clear and additional w/u reassuring. Doubt pulmonary embolism.         Raeford Razor, MD 09/04/12 [phone]

## 2012-09-08 ENCOUNTER — Other Ambulatory Visit (HOSPITAL_COMMUNITY): Payer: Self-pay | Admitting: Nurse Practitioner

## 2012-09-08 DIAGNOSIS — R1013 Epigastric pain: Secondary | ICD-10-CM

## 2012-09-12 ENCOUNTER — Ambulatory Visit (HOSPITAL_COMMUNITY)
Admission: RE | Admit: 2012-09-12 | Discharge: 2012-09-12 | Disposition: A | Discharge status: Home / Self Care | Payer: Medicaid Other | Source: Ambulatory Visit | Attending: *Deleted | Admitting: *Deleted

## 2012-09-12 DIAGNOSIS — R1013 Epigastric pain: Secondary | ICD-10-CM

## 2012-09-12 DIAGNOSIS — R112 Nausea with vomiting, unspecified: Secondary | ICD-10-CM | POA: Insufficient documentation

## 2012-10-29 ENCOUNTER — Other Ambulatory Visit: Payer: Self-pay | Admitting: Obstetrics & Gynecology

## 2012-10-30 NOTE — Telephone Encounter (Signed)
Pt informed that Xanax Rx was faxed to [facility].

## 2012-12-02 ENCOUNTER — Ambulatory Visit: Payer: Self-pay | Admitting: Adult Health

## 2013-02-02 ENCOUNTER — Other Ambulatory Visit: Payer: Self-pay | Admitting: Obstetrics & Gynecology

## 2013-02-06 ENCOUNTER — Ambulatory Visit: Payer: Self-pay | Admitting: Obstetrics & Gynecology

## 2013-02-11 ENCOUNTER — Ambulatory Visit: Payer: Self-pay | Admitting: Adult Health

## 2013-02-16 ENCOUNTER — Ambulatory Visit: Payer: Self-pay | Admitting: Adult Health

## 2013-02-27 ENCOUNTER — Ambulatory Visit (INDEPENDENT_AMBULATORY_CARE_PROVIDER_SITE_OTHER): Payer: Medicaid Other | Admitting: Adult Health

## 2013-02-27 ENCOUNTER — Encounter: Payer: Self-pay | Admitting: Adult Health

## 2013-02-27 VITALS — BP 108/70 | Ht 62.0 in | Wt 161.0 lb

## 2013-02-27 DIAGNOSIS — N949 Unspecified condition associated with female genital organs and menstrual cycle: Secondary | ICD-10-CM

## 2013-02-27 DIAGNOSIS — Z3202 Encounter for pregnancy test, result negative: Secondary | ICD-10-CM

## 2013-02-27 DIAGNOSIS — Z1389 Encounter for screening for other disorder: Secondary | ICD-10-CM

## 2013-02-27 LAB — POCT URINE PREGNANCY: Preg Test, Ur: NEGATIVE

## 2013-02-27 LAB — POCT URINALYSIS DIPSTICK
Blood, UA: NEGATIVE
Glucose, UA: NEGATIVE
Nitrite, UA: NEGATIVE
Protein, UA: NEGATIVE

## 2013-02-27 NOTE — Patient Instructions (Addendum)
Call next week for labs Change positions with sex

## 2013-02-27 NOTE — Progress Notes (Signed)
Subjective:     Patient ID: Meagan [name], female   DOB: 08-28-1990, 22 y.o.   MRN: [id number]  HPI Meagan [name] is a 22 year old white female married in complaining of RLQ pain at times and ?UTI and pain with sex.has had some diarrhea recently.  Review of Systems Positives in HPI Reviewed past medical,surgical, social and family history. Reviewed medications and allergies.     Objective:   Physical Exam BP 108/70  Ht 5\' 2"  (1.575 m)  Wt 161 lb (73.029 kg)  BMI 29.44 kg/m2  LMP 02/10/2013   urine pregnancy test negative, urine dipstick trace WBC, Skin warm and dry.Pelvic: external genitalia is normal in appearance, vagina: scant discharge without odor, cervix:smooth and bulbous, uterus: normal size, shape and contour, non tender, no masses felt, adnexa: no masses or tenderness noted.  Assessment:     Pelvic pain and pain with sex    Plan:    Check GC/CHL/TRIC Change positions with sex Pain may be bowel related Call prn

## 2013-02-28 LAB — GC/CHLAMYDIA PROBE AMP
CT Probe RNA: NEGATIVE
GC Probe RNA: NEGATIVE

## 2013-03-02 LAB — TRICHOMONAS VAGINALIS, PROBE AMP: T vaginalis RNA: NEGATIVE

## 2013-03-03 ENCOUNTER — Telehealth: Payer: Self-pay | Admitting: Adult Health

## 2013-03-03 NOTE — Telephone Encounter (Signed)
Pt aware negative  for GC/CHL and trich

## 2013-03-04 ENCOUNTER — Ambulatory Visit: Payer: Self-pay | Admitting: Advanced Practice Midwife

## 2013-03-11 ENCOUNTER — Other Ambulatory Visit: Payer: Self-pay | Admitting: Obstetrics & Gynecology

## 2013-03-29 ENCOUNTER — Emergency Department (HOSPITAL_COMMUNITY)
Admission: EM | Admit: 2013-03-29 | Discharge: 2013-03-30 | Disposition: A | Discharge status: Home / Self Care | Payer: Medicaid Other | Attending: Emergency Medicine | Admitting: Emergency Medicine

## 2013-03-29 ENCOUNTER — Encounter (HOSPITAL_COMMUNITY): Payer: Self-pay | Admitting: *Deleted

## 2013-03-29 DIAGNOSIS — Z8679 Personal history of other diseases of the circulatory system: Secondary | ICD-10-CM | POA: Insufficient documentation

## 2013-03-29 DIAGNOSIS — R110 Nausea: Secondary | ICD-10-CM

## 2013-03-29 DIAGNOSIS — F17200 Nicotine dependence, unspecified, uncomplicated: Secondary | ICD-10-CM | POA: Insufficient documentation

## 2013-03-29 DIAGNOSIS — Z880 Allergy status to penicillin: Secondary | ICD-10-CM | POA: Insufficient documentation

## 2013-03-29 DIAGNOSIS — R42 Dizziness and giddiness: Secondary | ICD-10-CM | POA: Insufficient documentation

## 2013-03-29 DIAGNOSIS — Z79899 Other long term (current) drug therapy: Secondary | ICD-10-CM | POA: Insufficient documentation

## 2013-03-29 DIAGNOSIS — J45909 Unspecified asthma, uncomplicated: Secondary | ICD-10-CM | POA: Insufficient documentation

## 2013-03-29 DIAGNOSIS — R1013 Epigastric pain: Secondary | ICD-10-CM

## 2013-03-29 DIAGNOSIS — Z87442 Personal history of urinary calculi: Secondary | ICD-10-CM | POA: Insufficient documentation

## 2013-03-29 DIAGNOSIS — Z8711 Personal history of peptic ulcer disease: Secondary | ICD-10-CM | POA: Insufficient documentation

## 2013-03-29 DIAGNOSIS — Z3202 Encounter for pregnancy test, result negative: Secondary | ICD-10-CM | POA: Insufficient documentation

## 2013-03-29 DIAGNOSIS — F411 Generalized anxiety disorder: Secondary | ICD-10-CM | POA: Insufficient documentation

## 2013-03-29 DIAGNOSIS — R509 Fever, unspecified: Secondary | ICD-10-CM | POA: Insufficient documentation

## 2013-03-29 DIAGNOSIS — K2970 Gastritis, unspecified, without bleeding: Secondary | ICD-10-CM

## 2013-03-29 LAB — PREGNANCY, URINE: Preg Test, Ur: NEGATIVE

## 2013-03-29 LAB — URINALYSIS, ROUTINE W REFLEX MICROSCOPIC
Bilirubin Urine: NEGATIVE
Glucose, UA: NEGATIVE mg/dL
Ketones, ur: NEGATIVE mg/dL
Nitrite: NEGATIVE
Protein, ur: NEGATIVE mg/dL
Specific Gravity, Urine: 1.025 (ref 1.005–1.030)
Urobilinogen, UA: 0.2 mg/dL (ref 0.0–1.0)
pH: 6 (ref 5.0–8.0)

## 2013-03-29 LAB — CBC WITH DIFFERENTIAL/PLATELET
Basophils Absolute: 0 10*3/uL (ref 0.0–0.1)
Basophils Relative: 0 % (ref 0–1)
Eosinophils Absolute: 0.1 10*3/uL (ref 0.0–0.7)
Eosinophils Relative: 1 % (ref 0–5)
HCT: 43 % (ref 36.0–46.0)
Hemoglobin: 14.6 g/dL (ref 12.0–15.0)
Lymphocytes Relative: 28 % (ref 12–46)
Lymphs Abs: 2.3 10*3/uL (ref 0.7–4.0)
MCH: 30.4 pg (ref 26.0–34.0)
MCHC: 34 g/dL (ref 30.0–36.0)
MCV: 89.4 fL (ref 78.0–100.0)
Monocytes Absolute: 0.5 10*3/uL (ref 0.1–1.0)
Monocytes Relative: 6 % (ref 3–12)
Neutro Abs: 5.3 10*3/uL (ref 1.7–7.7)
Neutrophils Relative %: 65 % (ref 43–77)
Platelets: 218 10*3/uL (ref 150–400)
RBC: 4.81 MIL/uL (ref 3.87–5.11)
RDW: 13.2 % (ref 11.5–15.5)
WBC: 8.2 10*3/uL (ref 4.0–10.5)

## 2013-03-29 LAB — URINE MICROSCOPIC-ADD ON

## 2013-03-29 MED ORDER — FAMOTIDINE 20 MG PO TABS
20.0000 mg | ORAL_TABLET | Freq: Once | ORAL | Status: AC
  Administered 2013-03-29: 20 mg via ORAL
  Filled 2013-03-29: qty 1

## 2013-03-29 NOTE — ED Notes (Signed)
Patient complaining of upper left abdominal pain. States "I have been having this problem for over a year. It just got worse."

## 2013-03-29 NOTE — ED Notes (Signed)
Pt states she is having luq pain, nausea and low grade fever. Pt thinks she is pregnant.

## 2013-03-29 NOTE — ED Provider Notes (Signed)
Scribed for Meagan Roller, MD, the patient was seen in room APA07/APA07. This chart was scribed by Lewanda Rife, ED scribe. Patient's care was started at 2300  CSN: [id number]     Arrival date & time 03/29/13  2227 History   First MD Initiated Contact with Patient 03/29/13 2254     Chief Complaint  Patient presents with  . Nausea  . Fever  . Abdominal Pain   (Consider location/radiation/quality/duration/timing/severity/associated sxs/prior Treatment) The history is provided by the patient.   HPI Comments: Meagan [name] is a 22 y.o. female who presents to the Emergency Department complaining of constant mild intermittent LUQ abdominal pain onset months. Describes abdominal pain as aching and non-radiating. Reports associated subjective low grade fever today as high as 100.3, waxing and waning nausea, early satiety, but no post prandial pain or nausea.  She does have some intermittent dizziness, Reports home pregnancy test was negative today. Reports possible pregnancy. Denies associated blood in stool, diarrhea, dysuria (resolved from 2 days ago), any weight loss, weight gain, . Reports taking AZO with relief of dysuria 2 days ago. Denies taking any contraceptives. Reports PMHx of anxiety, recurrent UTIs, and migraines. Denies other pertinent PMHx. Reports not taking any prescribed medications for stomach ulcer because she does not have health insurance.   Sx of the epigastrium and LUQ have been going on since before her last child was born - seemed to get better when she was pregnant then came back after delivery.    Past Medical History  Diagnosis Date  . Asthma   . Migraine   . Anxiety   . Mental disorder     huffed inhalants "affected brain"  . Kidney stones   . Pyelonephritis complicating pregnancy, antepartum, first trimester 11/05/2011  . Stomach ulcer    Past Surgical History  Procedure Laterality Date  . Wisdom tooth extraction     Family History  Problem  Relation Age of Onset  . Cancer Maternal Grandmother     breast  . Cancer Paternal Grandfather     lung cancer  . Hypertension Other    History  Substance Use Topics  . Smoking status: Current Every Day Smoker -- 1.00 packs/day for 4 years    Types: Cigarettes  . Smokeless tobacco: Never Used  . Alcohol Use: No   OB History   Grav Para Term Preterm Abortions TAB SAB Ect Mult Living   2 1 1  0 1 0 1 0 0 1     Review of Systems  Gastrointestinal: Positive for abdominal pain.  All other systems reviewed and are negative.   A complete 10 system review of systems was obtained and all systems are negative except as noted in the HPI and PMH.    Allergies  Codeine and Penicillins  Home Medications   Current Outpatient Rx  Name  Route  Sig  Dispense  Refill  . albuterol (PROVENTIL HFA;VENTOLIN HFA) 108 (90 BASE) MCG/ACT inhaler   Inhalation   Inhale 2 puffs into the lungs every 4 (four) hours as needed. For coughing         . ALPRAZolam (XANAX) 0.5 MG tablet      TAKE (1) TABLET BY MOUTH TWICE DAILY.   60 tablet   1   . famotidine (PEPCID) 20 MG tablet   Oral   Take 1 tablet (20 mg total) by mouth 2 (two) times daily.   30 tablet   0    BP 115/71  Pulse 90  Temp(Src)  98.2 F (36.8 C) (Oral)  Resp 24  Ht 5\' 2"  (1.575 m)  Wt 160 lb (72.576 kg)  BMI 29.26 kg/m2  SpO2 100%  LMP 03/07/2013 Physical Exam  Nursing note and vitals reviewed. Constitutional: She is oriented to person, place, and time. She appears well-developed and well-nourished. No distress.  HENT:  Head: Normocephalic and atraumatic.  Mouth/Throat: Oropharynx is clear and moist. No oropharyngeal exudate.  Eyes: Conjunctivae and EOM are normal. No scleral icterus.  Neck: Normal range of motion. Neck supple. No tracheal deviation present.  Cardiovascular: Normal rate and regular rhythm.   No murmur heard. Pulmonary/Chest: Effort normal and breath sounds normal. No stridor. No respiratory distress.  She has no wheezes. She has no rales.  Abdominal: Soft. Bowel sounds are normal. There is tenderness in the epigastric area and left upper quadrant. There is no guarding, no tenderness at McBurney's point and negative Murphy's sign.  Musculoskeletal: Normal range of motion.  Neurological: She is alert and oriented to person, place, and time.  Skin: Skin is warm and dry. No rash noted.  Psychiatric: She has a normal mood and affect. Her behavior is normal.    ED Course  Procedures (including critical care time) Medications  famotidine (PEPCID) tablet 20 mg (20 mg Oral Given 03/29/13 2320)    Labs Review Labs Reviewed  URINALYSIS, ROUTINE W REFLEX MICROSCOPIC - Abnormal; Notable for the following:    Hgb urine dipstick SMALL (*)    Leukocytes, UA TRACE (*)    All other components within normal limits  COMPREHENSIVE METABOLIC PANEL - Abnormal; Notable for the following:    Sodium 134 (*)    Glucose, Bld 111 (*)    Total Bilirubin 0.2 (*)    All other components within normal limits  URINE MICROSCOPIC-ADD ON - Abnormal; Notable for the following:    Squamous Epithelial / LPF MANY (*)    Bacteria, UA FEW (*)    All other components within normal limits  PREGNANCY, URINE  CBC WITH DIFFERENTIAL  LIPASE, BLOOD   Imaging Review No results found.  MDM   1. Epigastric pain   2. Nausea   3. Gastritis     Pt has ongoing chronic mild discomfort - she has no weight loss or gain and has a soft benign abdomen.  She is not pregnant on UA / preg, and has not UTI - will check lipase and CMP - low suspicion for surgical cause.  She has been using BC powder recently and has hx of same which is part of the reason she states she was told she had gastritis / PUD in the past - has been on multiple PPI's in the past - readily admits she has no money for Rx meds for this and thus takes no antacids.   Labs without acute findings - pt not pregnant, no UTI - pt informed, start pepcid, stable for  d/c.  Meds given in ED:  Medications  famotidine (PEPCID) tablet 20 mg (20 mg Oral Given 03/29/13 2320)    New Prescriptions   FAMOTIDINE (PEPCID) 20 MG TABLET    Take 1 tablet (20 mg total) by mouth 2 (two) times daily.      I personally performed the services described in this documentation, which was scribed in my presence. The recorded information has been reviewed and is accurate.    Meagan Roller, MD 03/30/13 0030

## 2013-03-30 LAB — COMPREHENSIVE METABOLIC PANEL
ALT: 9 U/L (ref 0–35)
AST: 6 U/L (ref 0–37)
Albumin: 3.6 g/dL (ref 3.5–5.2)
Alkaline Phosphatase: 69 U/L (ref 39–117)
BUN: 12 mg/dL (ref 6–23)
CO2: 26 mEq/L (ref 19–32)
Calcium: 9.5 mg/dL (ref 8.4–10.5)
Chloride: 100 mEq/L (ref 96–112)
Creatinine, Ser: 0.76 mg/dL (ref 0.50–1.10)
GFR calc Af Amer: >90 mL/min (ref >90)
GFR calc non Af Amer: >90 mL/min (ref >90)
Glucose, Bld: 111 mg/dL — ABNORMAL HIGH (ref 70–99)
Potassium: 4.1 mEq/L (ref 3.5–5.1)
Sodium: 134 mEq/L — ABNORMAL LOW (ref 135–145)
Total Bilirubin: 0.2 mg/dL — ABNORMAL LOW (ref 0.3–1.2)
Total Protein: 6.3 g/dL (ref 6.0–8.3)

## 2013-03-30 LAB — LIPASE, BLOOD: Lipase: 24 U/L (ref 11–59)

## 2013-03-30 MED ORDER — FAMOTIDINE 20 MG PO TABS: 20.0000 mg | ORAL_TABLET | Freq: Two times a day (BID) | ORAL | Status: DC

## 2013-04-09 ENCOUNTER — Telehealth: Payer: Self-pay | Admitting: Adult Health

## 2013-04-09 NOTE — Telephone Encounter (Signed)
Spoke with pt. Has had a migraine all day. Has a history of migraines. Tylenol and BC not helping. Has no insurance and don't have the money to come in. Allergic to Codeine and PCN. Can you call in med? Uses [facility]. Thanks!!!

## 2013-04-10 MED ORDER — BUTALBITAL-APAP-CAFFEINE 50-325-40 MG PO TABS: 1.0000 | ORAL_TABLET | Freq: Four times a day (QID) | ORAL | Status: DC | PRN

## 2013-04-10 NOTE — Telephone Encounter (Signed)
Spoke with pt letting her know Fioricet Rx can be picked up at office per Dr. Despina Hidden. JSY

## 2013-04-15 ENCOUNTER — Encounter (HOSPITAL_COMMUNITY): Payer: Self-pay | Admitting: Emergency Medicine

## 2013-04-15 ENCOUNTER — Emergency Department (HOSPITAL_COMMUNITY): Payer: Medicaid Other

## 2013-04-15 ENCOUNTER — Emergency Department (HOSPITAL_COMMUNITY)
Admission: EM | Admit: 2013-04-15 | Discharge: 2013-04-16 | Disposition: A | Discharge status: Home / Self Care | Payer: Medicaid Other | Attending: Emergency Medicine | Admitting: Emergency Medicine

## 2013-04-15 DIAGNOSIS — R519 Headache, unspecified: Secondary | ICD-10-CM

## 2013-04-15 DIAGNOSIS — Z79899 Other long term (current) drug therapy: Secondary | ICD-10-CM | POA: Insufficient documentation

## 2013-04-15 DIAGNOSIS — Z8711 Personal history of peptic ulcer disease: Secondary | ICD-10-CM | POA: Insufficient documentation

## 2013-04-15 DIAGNOSIS — Z880 Allergy status to penicillin: Secondary | ICD-10-CM | POA: Insufficient documentation

## 2013-04-15 DIAGNOSIS — F411 Generalized anxiety disorder: Secondary | ICD-10-CM | POA: Insufficient documentation

## 2013-04-15 DIAGNOSIS — J45909 Unspecified asthma, uncomplicated: Secondary | ICD-10-CM | POA: Insufficient documentation

## 2013-04-15 DIAGNOSIS — F489 Nonpsychotic mental disorder, unspecified: Secondary | ICD-10-CM | POA: Insufficient documentation

## 2013-04-15 DIAGNOSIS — G43909 Migraine, unspecified, not intractable, without status migrainosus: Secondary | ICD-10-CM | POA: Insufficient documentation

## 2013-04-15 DIAGNOSIS — F17200 Nicotine dependence, unspecified, uncomplicated: Secondary | ICD-10-CM | POA: Insufficient documentation

## 2013-04-15 DIAGNOSIS — Z87442 Personal history of urinary calculi: Secondary | ICD-10-CM | POA: Insufficient documentation

## 2013-04-15 MED ORDER — DIPHENHYDRAMINE HCL 50 MG/ML IJ SOLN
25.0000 mg | Freq: Once | INTRAMUSCULAR | Status: AC
  Administered 2013-04-16: 25 mg via INTRAVENOUS
  Filled 2013-04-15: qty 1

## 2013-04-15 MED ORDER — KETOROLAC TROMETHAMINE 30 MG/ML IJ SOLN
30.0000 mg | Freq: Once | INTRAMUSCULAR | Status: AC
  Administered 2013-04-16: 30 mg via INTRAVENOUS
  Filled 2013-04-15: qty 1

## 2013-04-15 MED ORDER — METOCLOPRAMIDE HCL 5 MG/ML IJ SOLN
10.0000 mg | Freq: Once | INTRAMUSCULAR | Status: AC
  Administered 2013-04-16: 10 mg via INTRAVENOUS
  Filled 2013-04-15: qty 2

## 2013-04-15 MED ORDER — SODIUM CHLORIDE 0.9 % IV BOLUS (SEPSIS)
1000.0000 mL | Freq: Once | INTRAVENOUS | Status: AC
  Administered 2013-04-16: 1000 mL via INTRAVENOUS

## 2013-04-15 NOTE — ED Provider Notes (Signed)
CSN: [id number]     Arrival date & time 04/15/13  2258 History  This chart was scribed for Geoffery Lyons, MD by Leone Payor, ED Scribe. This patient was seen in room APA18/APA18 and the patient's care was started 11:45 PM.     Chief Complaint  Patient presents with  . Headache    The history is provided by the patient. No language interpreter was used.    HPI Comments: Meagan [name] is a 22 y.o. female with past medical history of migraine who presents to the Emergency Department complaining of 2 weeks of gradual onset, gradually worsening, constant HA. She states the location of the HA moves, states it was to the left temple yesterday but to the right temple today. She describes this pain as dull and aching, sharp. She states she has intermittent nausea and intermittent visual disturbances. She has taken tylenol and BC powders with mild relief. She reports regularly drinking soda.   Past Medical History  Diagnosis Date  . Asthma   . Migraine   . Anxiety   . Mental disorder     huffed inhalants "affected brain"  . Kidney stones   . Pyelonephritis complicating pregnancy, antepartum, first trimester 11/05/2011  . Stomach ulcer    Past Surgical History  Procedure Laterality Date  . Wisdom tooth extraction     Family History  Problem Relation Age of Onset  . Cancer Maternal Grandmother     breast  . Cancer Paternal Grandfather     lung cancer  . Hypertension Other    History  Substance Use Topics  . Smoking status: Current Every Day Smoker -- 1.00 packs/day for 4 years    Types: Cigarettes  . Smokeless tobacco: Never Used  . Alcohol Use: No   OB History   Grav Para Term Preterm Abortions TAB SAB Ect Mult Living   2 1 1  0 1 0 1 0 0 1     Review of Systems  Eyes: Positive for visual disturbance.  Gastrointestinal: Positive for nausea.  Neurological: Positive for headaches.  All other systems reviewed and are negative.    Allergies  Codeine and  Penicillins  Home Medications   Current Outpatient Rx  Name  Route  Sig  Dispense  Refill  . albuterol (PROVENTIL HFA;VENTOLIN HFA) 108 (90 BASE) MCG/ACT inhaler   Inhalation   Inhale 2 puffs into the lungs every 4 (four) hours as needed. For coughing         . ALPRAZolam (XANAX) 0.5 MG tablet      TAKE (1) TABLET BY MOUTH TWICE DAILY.   60 tablet   1   . butalbital-acetaminophen-caffeine (FIORICET) 50-325-40 MG per tablet   Oral   Take 1-2 tablets by mouth every 6 (six) hours as needed for headache.   20 tablet   0   . famotidine (PEPCID) 20 MG tablet   Oral   Take 1 tablet (20 mg total) by mouth 2 (two) times daily.   30 tablet   0    BP 121/71  Pulse 86  Temp(Src) 98.2 F (36.8 C) (Oral)  Resp 18  Ht 5\' 2"  (1.575 m)  Wt 156 lb 8 oz (70.988 kg)  BMI 28.62 kg/m2  SpO2 99%  LMP 04/07/2013 Physical Exam  Nursing note and vitals reviewed. Constitutional: She is oriented to person, place, and time. She appears well-developed and well-nourished.  HENT:  Head: Normocephalic and atraumatic.  Mouth/Throat: Oropharynx is clear and moist.  Eyes: Conjunctivae and EOM  are normal. Pupils are equal, round, and reactive to light. No scleral icterus.  No papilledema on fundoscopic exam.   Neck: Normal range of motion. Neck supple.  Cardiovascular: Normal rate, regular rhythm and normal heart sounds.   Pulmonary/Chest: Effort normal and breath sounds normal.  Abdominal: Soft. Bowel sounds are normal.  Musculoskeletal: Normal range of motion.  Neurological: She is alert and oriented to person, place, and time. She has normal reflexes. She displays normal reflexes. No cranial nerve deficit. She exhibits normal muscle tone. Coordination normal.  Skin: Skin is warm and dry.  Psychiatric: She has a normal mood and affect.    ED Course  Procedures   DIAGNOSTIC STUDIES: Oxygen Saturation is 99% on RA, normal by my interpretation.    COORDINATION OF CARE: 11:46 PM Discussed  treatment plan with pt at bedside and pt agreed to plan.    Labs Review Labs Reviewed - No data to display Imaging Review No results found.  EKG Interpretation   None       MDM  No diagnosis found. CT of the head is unremarkable and neurologic exam is nonfocal. She will be discharged to home with instructions to follow up with her primary care Dr. if not improving. I see no indications for lumbar puncture at this time as there is no fever no nuchal rigidity.  I personally performed the services described in this documentation, which was scribed in my presence. The recorded information has been reviewed and is accurate.       Geoffery Lyons, MD 04/16/13 [phone]

## 2013-04-15 NOTE — ED Notes (Signed)
Pt c/o headache x 2 weeks

## 2013-04-16 MED ORDER — HYDROCODONE-ACETAMINOPHEN 5-325 MG PO TABS: 2.0000 | ORAL_TABLET | ORAL | Status: DC | PRN

## 2013-05-07 ENCOUNTER — Other Ambulatory Visit: Payer: Self-pay

## 2013-06-01 ENCOUNTER — Encounter: Payer: Self-pay | Admitting: Obstetrics & Gynecology

## 2013-06-01 ENCOUNTER — Ambulatory Visit (INDEPENDENT_AMBULATORY_CARE_PROVIDER_SITE_OTHER): Payer: Medicaid Other | Admitting: Obstetrics & Gynecology

## 2013-06-01 VITALS — BP 104/50 | Ht 62.0 in | Wt 155.8 lb

## 2013-06-01 DIAGNOSIS — Z3201 Encounter for pregnancy test, result positive: Secondary | ICD-10-CM

## 2013-06-01 LAB — POCT URINE PREGNANCY: Preg Test, Ur: POSITIVE

## 2013-06-01 MED ORDER — PRENATAL PLUS 27-1 MG PO TABS: 1.0000 | ORAL_TABLET | Freq: Every day | ORAL | Status: DC

## 2013-06-01 NOTE — Progress Notes (Signed)
Patient ID: Meagan [name], female   DOB: Mar 04, 1991, 22 y.o.   MRN: [id number] Pt here today for pregnancy test, resulted positive.  Discussed use of Xanax with pt, per [name], CNM pt to begin weaning off Xanax, 1/2 tab daily X 1 - 2 weeks.  Prenatal Vitamins Escribed.

## 2013-06-04 ENCOUNTER — Telehealth: Payer: Self-pay | Admitting: Obstetrics & Gynecology

## 2013-06-04 ENCOUNTER — Ambulatory Visit (INDEPENDENT_AMBULATORY_CARE_PROVIDER_SITE_OTHER): Payer: Medicaid Other | Admitting: Adult Health

## 2013-06-04 ENCOUNTER — Encounter: Payer: Self-pay | Admitting: Adult Health

## 2013-06-04 ENCOUNTER — Telehealth: Payer: Self-pay | Admitting: Adult Health

## 2013-06-04 VITALS — BP 108/50 | Temp 98.6°F | Ht 62.0 in | Wt 156.0 lb

## 2013-06-04 DIAGNOSIS — R197 Diarrhea, unspecified: Secondary | ICD-10-CM

## 2013-06-04 DIAGNOSIS — Z331 Pregnant state, incidental: Secondary | ICD-10-CM

## 2013-06-04 DIAGNOSIS — J029 Acute pharyngitis, unspecified: Secondary | ICD-10-CM | POA: Insufficient documentation

## 2013-06-04 DIAGNOSIS — R319 Hematuria, unspecified: Secondary | ICD-10-CM

## 2013-06-04 DIAGNOSIS — R110 Nausea: Secondary | ICD-10-CM | POA: Insufficient documentation

## 2013-06-04 DIAGNOSIS — Z3490 Encounter for supervision of normal pregnancy, unspecified, unspecified trimester: Secondary | ICD-10-CM

## 2013-06-04 DIAGNOSIS — O99810 Abnormal glucose complicating pregnancy: Secondary | ICD-10-CM

## 2013-06-04 DIAGNOSIS — Z1389 Encounter for screening for other disorder: Secondary | ICD-10-CM

## 2013-06-04 DIAGNOSIS — O210 Mild hyperemesis gravidarum: Secondary | ICD-10-CM

## 2013-06-04 HISTORY — DX: Diarrhea, unspecified: R19.7

## 2013-06-04 LAB — POCT URINALYSIS DIPSTICK
Blood, UA: 1
Glucose, UA: NEGATIVE
Ketones, UA: NEGATIVE
Nitrite, UA: NEGATIVE
Protein, UA: NEGATIVE

## 2013-06-04 LAB — HCG, QUANTITATIVE, PREGNANCY: hCG, Beta Chain, Quant, S: 2254 m[IU]/mL

## 2013-06-04 LAB — PROGESTERONE: Progesterone: 21.3 ng/mL

## 2013-06-04 MED ORDER — ONDANSETRON HCL 8 MG PO TABS: 8.0000 mg | ORAL_TABLET | Freq: Three times a day (TID) | ORAL | Status: DC | PRN

## 2013-06-04 NOTE — Telephone Encounter (Signed)
Per Dr. Despina Hidden, pt told not to abruptly stop taking xanax,needs to ween herself off by taking 1/2 tablet daily. Pt verbalized understanding.

## 2013-06-04 NOTE — Patient Instructions (Signed)
Pregnancy - First Trimester During sexual intercourse, millions of sperm go into the vagina. Only 1 sperm will penetrate and fertilize the female egg while it is in the Fallopian tube. One week later, the fertilized egg implants into the wall of the uterus. An embryo begins to develop into a baby. At 6 to 8 weeks, the eyes and face are formed and the heartbeat can be seen on ultrasound. At the end of 12 weeks (first trimester), all the baby's organs are formed. Now that you are pregnant, you will want to do everything you can to have a healthy baby. Two of the most important things are to get good prenatal care and follow your caregiver's instructions. Prenatal care is all the medical care you receive before the baby's birth. It is given to prevent, find, and treat problems during the pregnancy and childbirth. PRENATAL EXAMS  During prenatal visits, your weight, blood pressure, and urine are checked. This is done to make sure you are healthy and progressing normally during the pregnancy.  A pregnant woman should gain 25 to 35 pounds during the pregnancy. However, if you are overweight or underweight, your caregiver will advise you regarding your weight.  Your caregiver will ask and answer questions for you.  Blood work, cervical cultures, other necessary tests, and a Pap test are done during your prenatal exams. These tests are done to check on your health and the probable health of your baby. Tests are strongly recommended and done for HIV with your permission. This is the virus that causes AIDS. These tests are done because medicines can be given to help prevent your baby from being born with this infection should you have been infected without knowing it. Blood work is also used to find out your blood type, previous infections, and follow your blood levels (hemoglobin).  Low hemoglobin (anemia) is common during pregnancy. Iron and vitamins are given to help prevent this. Later in the pregnancy, blood  tests for diabetes will be done along with any other tests if any problems develop.  You may need other tests to make sure you and the baby are doing well. CHANGES DURING THE FIRST TRIMESTER  Your body goes through many changes during pregnancy. They vary from person to person. Talk to your caregiver about changes you notice and are concerned about. Changes can include:  Your menstrual period stops.  The egg and sperm carry the genes that determine what you look like. Genes from you and your partner are forming a baby. The female genes determine whether the baby is a boy or a girl.  Your body increases in girth and you may feel bloated.  Feeling sick to your stomach (nauseous) and throwing up (vomiting). If the vomiting is uncontrollable, call your caregiver.  Your breasts will begin to enlarge and become tender.  Your nipples may stick out more and become darker.  The need to urinate more. Painful urination may mean you have a bladder infection.  Tiring easily.  Loss of appetite.  Cravings for certain kinds of food.  At first, you may gain or lose a couple of pounds.  You may have changes in your emotions from day to day (excited to be pregnant or concerned something may go wrong with the pregnancy and baby).  You may have more vivid and strange dreams. HOME CARE INSTRUCTIONS   It is very important to avoid all smoking, alcohol and non-prescribed drugs during your pregnancy. These affect the formation and growth of the baby.   Avoid chemicals while pregnant to ensure the delivery of a healthy infant.  Start your prenatal visits by the 12th week of pregnancy. They are usually scheduled monthly at first, then more often in the last 2 months before delivery. Keep your caregiver's appointments. Follow your caregiver's instructions regarding medicine use, blood and lab tests, exercise, and diet.  During pregnancy, you are providing food for you and your baby. Eat regular, well-balanced  meals. Choose foods such as meat, fish, milk and other low fat dairy products, vegetables, fruits, and whole-grain breads and cereals. Your caregiver will tell you of the ideal weight gain.  You can help morning sickness by keeping soda crackers at the bedside. Eat a couple before arising in the morning. You may want to use the crackers without salt on them.  Eating 4 to 5 small meals rather than 3 large meals a day also may help the nausea and vomiting.  Drinking liquids between meals instead of during meals also seems to help nausea and vomiting.  A physical sexual relationship may be continued throughout pregnancy if there are no other problems. Problems may be early (premature) leaking of amniotic fluid from the membranes, vaginal bleeding, or belly (abdominal) pain.  Exercise regularly if there are no restrictions. Check with your caregiver or physical therapist if you are unsure of the safety of some of your exercises. Greater weight gain will occur in the last 2 trimesters of pregnancy. Exercising will help:  Control your weight.  Keep you in shape.  Prepare you for labor and delivery.  Help you lose your pregnancy weight after you deliver your baby.  Wear a good support or jogging bra for breast tenderness during pregnancy. This may help if worn during sleep too.  Ask when prenatal classes are available. Begin classes when they are offered.  Do not use hot tubs, steam rooms, or saunas.  Wear your seat belt when driving. This protects you and your baby if you are in an accident.  Avoid raw meat, uncooked cheese, cat litter boxes, and soil used by cats throughout the pregnancy. These carry germs that can cause birth defects in the baby.  The first trimester is a good time to visit your dentist for your dental health. Getting your teeth cleaned is okay. Use a softer toothbrush and brush gently during pregnancy.  Ask for help if you have financial, counseling, or nutritional needs  during pregnancy. Your caregiver will be able to offer counseling for these needs as well as refer you for other special needs.  Do not take any medicines or herbs unless told by your caregiver.  Inform your caregiver if there is any mental or physical domestic violence.  Make a list of emergency phone numbers of family, friends, hospital, and police and fire departments.  Write down your questions. Take them to your prenatal visit.  Do not douche.  Do not cross your legs.  If you have to stand for long periods of time, rotate you feet or take small steps in a circle.  You may have more vaginal secretions that may require a sanitary pad. Do not use tampons or scented sanitary pads. MEDICINES AND DRUG USE IN PREGNANCY  Take prenatal vitamins as directed. The vitamin should contain 1 milligram of folic acid. Keep all vitamins out of reach of children. Only a couple vitamins or tablets containing iron may be fatal to a baby or young child when ingested.  Avoid use of all medicines, including herbs, over-the-counter medicines, not   prescribed or suggested by your caregiver. Only take over-the-counter or prescription medicines for pain, discomfort, or fever as directed by your caregiver. Do not use aspirin, ibuprofen, or naproxen unless directed by your caregiver.  Let your caregiver also know about herbs you may be using.  Alcohol is related to a number of birth defects. This includes fetal alcohol syndrome. All alcohol, in any form, should be avoided completely. Smoking will cause low birth rate and premature babies.  Street or illegal drugs are very harmful to the baby. They are absolutely forbidden. A baby born to an addicted mother will be addicted at birth. The baby will go through the same withdrawal an adult does.  Let your caregiver know about any medicines that you have to take and for what reason you take them. SEEK MEDICAL CARE IF:  You have any concerns or worries during your  pregnancy. It is better to call with your questions if you feel they cannot wait, rather than worry about them. SEEK IMMEDIATE MEDICAL CARE IF:   An unexplained oral temperature above 102 F (38.9 C) develops, or as your caregiver suggests.  You have leaking of fluid from the vagina (birth canal). If leaking membranes are suspected, take your temperature and inform your caregiver of this when you call.  There is vaginal spotting or bleeding. Notify your caregiver of the amount and how many pads are used.  You develop a bad smelling vaginal discharge with a change in the color.  You continue to feel sick to your stomach (nauseated) and have no relief from remedies suggested. You vomit blood or coffee ground-like materials.  You lose more than 2 pounds of weight in 1 week.  You gain more than 2 pounds of weight in 1 week and you notice swelling of your face, hands, feet, or legs.  You gain 5 pounds or more in 1 week (even if you do not have swelling of your hands, face, legs, or feet).  You get exposed to [place] measles and have never had them.  You are exposed to fifth disease or chickenpox.  You develop belly (abdominal) pain. Round ligament discomfort is a common non-cancerous (benign) cause of abdominal pain in pregnancy. Your caregiver still must evaluate this.  You develop headache, fever, diarrhea, pain with urination, or shortness of breath.  You fall or are in a car accident or have any kind of trauma.  There is mental or physical violence in your home. Document Released: 06/12/2001 Document Revised: 03/12/2012 Document Reviewed: 12/14/2008 [facility] Patient Information 2014 [place], [place]. Diarrhea Diarrhea is frequent loose and watery bowel movements. It can cause you to feel weak and dehydrated. Dehydration can cause you to become tired and thirsty, have a dry mouth, and have decreased urination that often is dark yellow. Diarrhea is a sign of another problem, most often  an infection that will not last long. In most cases, diarrhea typically lasts 2 3 days. However, it can last longer if it is a sign of something more serious. It is important to treat your diarrhea as directed by your caregive to lessen or prevent future episodes of diarrhea. CAUSES  Some common causes include:  Gastrointestinal infections caused by viruses, bacteria, or parasites.  Food poisoning or food allergies.  Certain medicines, such as antibiotics, chemotherapy, and laxatives.  Artificial sweeteners and fructose.  Digestive disorders. HOME CARE INSTRUCTIONS  Ensure adequate fluid intake (hydration): have 1 cup (8 oz) of fluid for each diarrhea episode. Avoid fluids that contain simple  sugars or sports drinks, fruit juices, whole milk products, and sodas. Your urine should be clear or pale yellow if you are drinking enough fluids. Hydrate with an oral rehydration solution that you can purchase at pharmacies, retail stores, and online. You can prepare an oral rehydration solution at home by mixing the following ingredients together:    tsp table salt.   tsp baking soda.   tsp salt substitute containing potassium chloride.  1  tablespoons sugar.  1 L (34 oz) of water.  Certain foods and beverages may increase the speed at which food moves through the gastrointestinal (GI) tract. These foods and beverages should be avoided and include:  Caffeinated and alcoholic beverages.  High-fiber foods, such as raw fruits and vegetables, nuts, seeds, and whole grain breads and cereals.  Foods and beverages sweetened with sugar alcohols, such as xylitol, sorbitol, and mannitol.  Some foods may be well tolerated and may help thicken stool including:  Starchy foods, such as rice, toast, pasta, low-sugar cereal, oatmeal, grits, baked potatoes, crackers, and bagels.  Bananas.  Applesauce.  Add probiotic-rich foods to help increase healthy bacteria in the GI tract, such as yogurt and  fermented milk products.  Wash your hands well after each diarrhea episode.  Only take over-the-counter or prescription medicines as directed by your caregiver.  Take a warm bath to relieve any burning or pain from frequent diarrhea episodes. SEEK IMMEDIATE MEDICAL CARE IF:   You are unable to keep fluids down.  You have persistent vomiting.  You have blood in your stool, or your stools are black and tarry.  You do not urinate in 6 8 hours, or there is only a small amount of very dark urine.  You have abdominal pain that increases or localizes.  You have weakness, dizziness, confusion, or lightheadedness.  You have a severe headache.  Your diarrhea gets worse or does not get better.  You have a fever or persistent symptoms for more than 2 3 days.  You have a fever and your symptoms suddenly get worse. MAKE SURE YOU:   Understand these instructions.  Will watch your condition.  Will get help right away if you are not doing well or get worse. Document Released: 06/08/2002 Document Revised: 06/04/2012 Document Reviewed: 02/24/2012 [facility] Patient Information 2014 [place], [place]. Return as shceduled

## 2013-06-04 NOTE — Progress Notes (Signed)
Subjective:     Patient ID: Meagan [name], female   DOB: 1991/04/11, 22 y.o.   MRN: [id number]  HPI Meagan [name] is a 22 year old white female in complaining of diarrhea and stomach ache, was seen by Dr Meagan [name] at [facility] for sore throat and cold symptoms and rx'd a Zpack.She is pregnant she says, about 7 weeks.She takes xanax bid  And has been for over a year.   Review of Systems See HPI Reviewed past medical,surgical, social and family history. Reviewed medications and allergies.     Objective:   Physical Exam BP 108/50  Temp(Src) 98.6 F (37 C)  Ht 5\' 2"  (1.575 m)  Wt 156 lb (70.761 kg)  BMI 28.53 kg/m2  LMP 10/15/2014urine +leuks and blood, discussed that she has diarrhea and stomach aches and had nausea last night.Still complains of sore throat and headache, has not taken xanax yet today.Instructed to take Zpack and not stop xanax cold [place].US showed small fluid sac about 5 weeks size in uterus, no yolk sac or fetal pole seen,    Assessment:     Early pregnancy, size< dates Diarrhea Nausea Sore throat Xanax use   Hematuria Plan:     Check QHCG stat and check progesterone level, will call with results Continue Z pack, do warm salt water gargles Rx zofran 8 mg 1 every 8 hours #20 with 1 refill should help nausea and diarrhea, ok to take imodium if needed Take xanax try 1/2 tab to see if that works, but do not stop cold [place], tried to explain baby already exposed to drug and do not want her to have seizure if  Stops Return 12/9  for [name] as scheduled   UA C&S Review handouts on first trimester and diarhea

## 2013-06-04 NOTE — Telephone Encounter (Signed)
Pt aware of labs keep appt. 

## 2013-06-05 ENCOUNTER — Telehealth: Payer: Self-pay | Admitting: Adult Health

## 2013-06-05 LAB — URINALYSIS
Bilirubin Urine: NEGATIVE
Glucose, UA: NEGATIVE mg/dL
Hgb urine dipstick: NEGATIVE
Ketones, ur: NEGATIVE mg/dL
Leukocytes, UA: NEGATIVE
Nitrite: NEGATIVE
Protein, ur: NEGATIVE mg/dL
Specific Gravity, Urine: 1.025 (ref 1.005–1.030)
Urobilinogen, UA: 0.2 mg/dL (ref 0.0–1.0)
pH: 6.5 (ref 5.0–8.0)

## 2013-06-05 LAB — URINE CULTURE
Colony Count: NO GROWTH
Organism ID, Bacteria: NO GROWTH

## 2013-06-05 NOTE — Telephone Encounter (Signed)
Pt aware of labs  

## 2013-06-08 ENCOUNTER — Other Ambulatory Visit: Payer: Self-pay | Admitting: Obstetrics & Gynecology

## 2013-06-08 DIAGNOSIS — O3680X0 Pregnancy with inconclusive fetal viability, not applicable or unspecified: Secondary | ICD-10-CM

## 2013-06-09 ENCOUNTER — Other Ambulatory Visit: Payer: Self-pay | Admitting: Obstetrics & Gynecology

## 2013-06-09 ENCOUNTER — Ambulatory Visit (INDEPENDENT_AMBULATORY_CARE_PROVIDER_SITE_OTHER): Payer: Medicaid Other

## 2013-06-09 ENCOUNTER — Other Ambulatory Visit: Payer: Medicaid Other

## 2013-06-09 DIAGNOSIS — O26849 Uterine size-date discrepancy, unspecified trimester: Secondary | ICD-10-CM

## 2013-06-09 DIAGNOSIS — O3680X0 Pregnancy with inconclusive fetal viability, not applicable or unspecified: Secondary | ICD-10-CM

## 2013-06-15 ENCOUNTER — Other Ambulatory Visit: Payer: Self-pay | Admitting: Obstetrics & Gynecology

## 2013-06-15 DIAGNOSIS — O3680X0 Pregnancy with inconclusive fetal viability, not applicable or unspecified: Secondary | ICD-10-CM

## 2013-06-17 ENCOUNTER — Encounter: Payer: Self-pay | Admitting: Adult Health

## 2013-06-17 ENCOUNTER — Other Ambulatory Visit: Payer: Self-pay | Admitting: Obstetrics & Gynecology

## 2013-06-17 ENCOUNTER — Ambulatory Visit (INDEPENDENT_AMBULATORY_CARE_PROVIDER_SITE_OTHER): Payer: Medicaid Other | Admitting: Adult Health

## 2013-06-17 ENCOUNTER — Telehealth: Payer: Self-pay | Admitting: Adult Health

## 2013-06-17 ENCOUNTER — Ambulatory Visit (INDEPENDENT_AMBULATORY_CARE_PROVIDER_SITE_OTHER): Payer: Medicaid Other

## 2013-06-17 VITALS — BP 138/60 | Ht 62.0 in | Wt 156.8 lb

## 2013-06-17 DIAGNOSIS — F411 Generalized anxiety disorder: Secondary | ICD-10-CM | POA: Insufficient documentation

## 2013-06-17 DIAGNOSIS — R350 Frequency of micturition: Secondary | ICD-10-CM

## 2013-06-17 DIAGNOSIS — F419 Anxiety disorder, unspecified: Secondary | ICD-10-CM | POA: Insufficient documentation

## 2013-06-17 DIAGNOSIS — O99340 Other mental disorders complicating pregnancy, unspecified trimester: Secondary | ICD-10-CM

## 2013-06-17 DIAGNOSIS — O26849 Uterine size-date discrepancy, unspecified trimester: Secondary | ICD-10-CM

## 2013-06-17 DIAGNOSIS — O368990 Maternal care for other specified fetal problems, unspecified trimester, not applicable or unspecified: Secondary | ICD-10-CM

## 2013-06-17 DIAGNOSIS — O43899 Other placental disorders, unspecified trimester: Secondary | ICD-10-CM

## 2013-06-17 DIAGNOSIS — O3680X0 Pregnancy with inconclusive fetal viability, not applicable or unspecified: Secondary | ICD-10-CM

## 2013-06-17 DIAGNOSIS — O09299 Supervision of pregnancy with other poor reproductive or obstetric history, unspecified trimester: Secondary | ICD-10-CM

## 2013-06-17 DIAGNOSIS — R110 Nausea: Secondary | ICD-10-CM

## 2013-06-17 DIAGNOSIS — O3680X1 Pregnancy with inconclusive fetal viability, fetus 1: Secondary | ICD-10-CM

## 2013-06-17 DIAGNOSIS — Z3490 Encounter for supervision of normal pregnancy, unspecified, unspecified trimester: Secondary | ICD-10-CM

## 2013-06-17 DIAGNOSIS — O2390 Unspecified genitourinary tract infection in pregnancy, unspecified trimester: Secondary | ICD-10-CM

## 2013-06-17 LAB — POCT URINALYSIS DIPSTICK
Blood, UA: NEGATIVE
Glucose, UA: NEGATIVE
Ketones, UA: NEGATIVE
Nitrite, UA: NEGATIVE
Protein, UA: NEGATIVE

## 2013-06-17 MED ORDER — DOXYLAMINE-PYRIDOXINE 10-10 MG PO TBEC: DELAYED_RELEASE_TABLET | ORAL | Status: DC

## 2013-06-17 NOTE — Patient Instructions (Signed)
Follow up in 2 weeks for [name] and new ob

## 2013-06-17 NOTE — Progress Notes (Signed)
U/S-transabdominal u/s performed, single IUP with +FCA noted, FHR-124 bpm, CRL c/w 6+1wks EDD 02/09/2014, cx long and closed, bilateral adnexa wnl, no free fluid noted within pelvis, 24 x 7 mm Sub-Chorionic hemorrhage noted posterior to gestational sac

## 2013-06-17 NOTE — Telephone Encounter (Signed)
Pt had question about [facility], wants to google it.

## 2013-06-17 NOTE — Progress Notes (Addendum)
Subjective:     Patient ID: Meagan [name], female   DOB: 1990/08/31, 22 y.o.   MRN: [id number]  HPI Meagan [name] is in for a [name] and complains of nausea,anxiety and tied and left side aches at times and has frequent urination.  Review of Systems See HPI Reviewed past medical,surgical, social and family history. Reviewed medications and allergies.     Objective:   Physical Exam BP 138/60  Ht 5\' 2"  (1.575 m)  Wt 156 lb 12.8 oz (71.124 kg)  BMI 28.67 kg/m2  LMP 04/15/2013   urine 1+ leuks US showed IUP with FCA 124, CRL 5.2 mm = 6+1 weeks EDD 02/09/14 and 24 x 7 mm [facility]  Assessment:     Pregnant Nausea Anxiety Urinary frequency    Plan:     Diclegis as directed  Number of samples  3 boxes   Exp date 05/01/14 lot 1316 V No sex push fluids conitnue to take 1/2 xanax bid will decrease at follow up if can UA C&S and UDS today Return in 2 weeks for [name] and new OB

## 2013-06-18 LAB — URINALYSIS
Bilirubin Urine: NEGATIVE
Glucose, UA: NEGATIVE mg/dL
Hgb urine dipstick: NEGATIVE
Ketones, ur: NEGATIVE mg/dL
Nitrite: NEGATIVE
Protein, ur: NEGATIVE mg/dL
Specific Gravity, Urine: 1.006 (ref 1.005–1.030)
Urobilinogen, UA: 0.2 mg/dL (ref 0.0–1.0)
pH: 6.5 (ref 5.0–8.0)

## 2013-06-18 LAB — DRUG SCREEN, URINE
Amphetamine Screen, Ur: NEGATIVE
Barbiturate Quant, Ur: NEGATIVE
Benzodiazepines.: NEGATIVE
Cocaine Metabolites: NEGATIVE
Creatinine,U: 28.62 mg/dL
Marijuana Metabolite: NEGATIVE
Methadone: NEGATIVE
Opiates: NEGATIVE
Phencyclidine (PCP): NEGATIVE
Propoxyphene: NEGATIVE

## 2013-06-19 LAB — URINE CULTURE
Colony Count: NO GROWTH
Organism ID, Bacteria: NO GROWTH

## 2013-06-29 ENCOUNTER — Encounter (HOSPITAL_COMMUNITY): Payer: Self-pay | Admitting: Emergency Medicine

## 2013-06-29 ENCOUNTER — Telehealth: Payer: Self-pay | Admitting: *Deleted

## 2013-06-29 ENCOUNTER — Emergency Department (HOSPITAL_COMMUNITY)
Admission: EM | Admit: 2013-06-29 | Discharge: 2013-06-29 | Disposition: A | Discharge status: Home / Self Care | Payer: Medicaid Other | Attending: Emergency Medicine | Admitting: Emergency Medicine

## 2013-06-29 DIAGNOSIS — B9789 Other viral agents as the cause of diseases classified elsewhere: Secondary | ICD-10-CM | POA: Insufficient documentation

## 2013-06-29 DIAGNOSIS — R059 Cough, unspecified: Secondary | ICD-10-CM

## 2013-06-29 DIAGNOSIS — Z87442 Personal history of urinary calculi: Secondary | ICD-10-CM | POA: Insufficient documentation

## 2013-06-29 DIAGNOSIS — Z880 Allergy status to penicillin: Secondary | ICD-10-CM | POA: Insufficient documentation

## 2013-06-29 DIAGNOSIS — F411 Generalized anxiety disorder: Secondary | ICD-10-CM | POA: Insufficient documentation

## 2013-06-29 DIAGNOSIS — Z8679 Personal history of other diseases of the circulatory system: Secondary | ICD-10-CM | POA: Insufficient documentation

## 2013-06-29 DIAGNOSIS — J029 Acute pharyngitis, unspecified: Secondary | ICD-10-CM | POA: Insufficient documentation

## 2013-06-29 DIAGNOSIS — Z8711 Personal history of peptic ulcer disease: Secondary | ICD-10-CM | POA: Insufficient documentation

## 2013-06-29 DIAGNOSIS — IMO0001 Reserved for inherently not codable concepts without codable children: Secondary | ICD-10-CM | POA: Insufficient documentation

## 2013-06-29 DIAGNOSIS — R509 Fever, unspecified: Secondary | ICD-10-CM | POA: Insufficient documentation

## 2013-06-29 DIAGNOSIS — B349 Viral infection, unspecified: Secondary | ICD-10-CM

## 2013-06-29 DIAGNOSIS — O9989 Other specified diseases and conditions complicating pregnancy, childbirth and the puerperium: Secondary | ICD-10-CM | POA: Insufficient documentation

## 2013-06-29 DIAGNOSIS — R05 Cough: Secondary | ICD-10-CM

## 2013-06-29 DIAGNOSIS — J45909 Unspecified asthma, uncomplicated: Secondary | ICD-10-CM | POA: Insufficient documentation

## 2013-06-29 DIAGNOSIS — O99330 Smoking (tobacco) complicating pregnancy, unspecified trimester: Secondary | ICD-10-CM | POA: Insufficient documentation

## 2013-06-29 DIAGNOSIS — Z79899 Other long term (current) drug therapy: Secondary | ICD-10-CM | POA: Insufficient documentation

## 2013-06-29 MED ORDER — ALBUTEROL SULFATE HFA 108 (90 BASE) MCG/ACT IN AERS
2.0000 | INHALATION_SPRAY | Freq: Once | RESPIRATORY_TRACT | Status: AC
  Administered 2013-06-29: 2 via RESPIRATORY_TRACT
  Filled 2013-06-29: qty 6.7

## 2013-06-29 NOTE — ED Notes (Addendum)
Sore throat, and body aches. Fever.  2 mos pregnant

## 2013-06-29 NOTE — Telephone Encounter (Signed)
Pt states was given omnicef from urgent care MD, is this ok to take at 8 weeks? Per Dr. Emelda Fear is ok to take. Pt verbalized understanding.

## 2013-06-29 NOTE — ED Provider Notes (Signed)
CSN: [id number]     Arrival date & time 06/29/13  1926 History   First MD Initiated Contact with Patient 06/29/13 2136     Chief Complaint  Patient presents with  . Sore Throat   (Consider location/radiation/quality/duration/timing/severity/associated sxs/prior Treatment) Patient is a 22 y.o. female presenting with pharyngitis. The history is provided by the patient and a parent.  Sore Throat This is a new problem. Episode onset: 2 days ago. The problem occurs constantly. The problem has been unchanged. Associated symptoms include chills, congestion, a fever, myalgias and a sore throat. Pertinent negatives include no abdominal pain, arthralgias, chest pain, coughing, headaches, nausea, neck pain, numbness, rash, urinary symptoms, vertigo, visual change, vomiting or weakness. The symptoms are aggravated by swallowing. She has tried acetaminophen for the symptoms. The treatment provided no relief.    Patient who is 2 months pregnant c/o gradual onset of body aches, fever, sore throat and nasal congestion.  Sx's unrelieved with tylenol.  Was seen earlier at a local urgent care and given rx for antibiotic.  She denies abdominal pain, vaginal bleeding, neck stiffness, headache , dysuria or vomiting.  Has appt with her OB on 07/01/13.    Past Medical History  Diagnosis Date  . Asthma   . Migraine   . Anxiety   . Mental disorder     huffed inhalants "affected brain"  . Stomach ulcer   . Diarrhea 06/04/2013  . Sore throat 06/04/2013  . Nausea 06/04/2013  . Pregnant 06/04/2013  . Kidney stones   . Pyelonephritis complicating pregnancy, antepartum, first trimester 11/05/2011   History reviewed. No pertinent past surgical history. Family History  Problem Relation Age of Onset  . Cancer Maternal Grandmother     breast  . Cancer Paternal Grandfather     lung cancer  . Hypertension Other    History  Substance Use Topics  . Smoking status: Current Every Day Smoker -- 1.00 packs/day for 4 years     Types: Cigarettes  . Smokeless tobacco: Never Used  . Alcohol Use: No   OB History   Grav Para Term Preterm Abortions TAB SAB Ect Mult Living   3 1 1  0 1 0 1 0 0 1     Review of Systems  Constitutional: Positive for fever and chills.  HENT: Positive for congestion, rhinorrhea and sore throat.   Respiratory: Negative for cough.   Cardiovascular: Negative for chest pain.  Gastrointestinal: Negative for nausea, vomiting, abdominal pain, diarrhea and abdominal distention.  Genitourinary: Negative for dysuria, vaginal bleeding, vaginal discharge and vaginal pain.  Musculoskeletal: Positive for myalgias. Negative for arthralgias and neck pain.  Skin: Negative for rash.  Neurological: Negative for dizziness, vertigo, weakness, numbness and headaches.  Psychiatric/Behavioral: Negative for confusion.  All other systems reviewed and are negative.    Allergies  Codeine and Penicillins  Home Medications   Current Outpatient Rx  Name  Route  Sig  Dispense  Refill  . acetaminophen (TYLENOL) 500 MG tablet   Oral   Take 500 mg by mouth every 6 (six) hours as needed.         Marland Kitchen albuterol (PROVENTIL HFA;VENTOLIN HFA) 108 (90 BASE) MCG/ACT inhaler   Inhalation   Inhale 2 puffs into the lungs every 4 (four) hours as needed. For coughing         . ALPRAZolam (XANAX) 0.5 MG tablet   Oral   Take 0.25 mg by mouth 2 (two) times daily.         Marland Kitchen  cefdinir (OMNICEF) 300 MG capsule   Oral   Take 300 mg by mouth 2 (two) times daily. 10 day course starting on 06/29/2013         . ondansetron (ZOFRAN) 8 MG tablet   Oral   Take 1 tablet (8 mg total) by mouth every 8 (eight) hours as needed for nausea or vomiting.   20 tablet   1   . phenylephrine (SUDAFED PE) 10 MG TABS tablet   Oral   Take 10 mg by mouth every 4 (four) hours as needed.         . prenatal vitamin w/FE, FA (PRENATAL 1 + 1) 27-1 MG TABS tablet   Oral   Take 1 tablet by mouth at bedtime.          BP 124/67   Pulse 115  Temp(Src) 98.8 F (37.1 C) (Oral)  Resp 24  Ht 5\' 2"  (1.575 m)  Wt 160 lb (72.576 kg)  BMI 29.26 kg/m2  SpO2 100%  LMP 04/15/2013  Breastfeeding? No Physical Exam  Nursing note and vitals reviewed. Constitutional: She is oriented to person, place, and time. She appears well-developed and well-nourished. No distress.  HENT:  Head: Normocephalic and atraumatic.  Right Ear: Tympanic membrane and ear canal normal.  Left Ear: Tympanic membrane and ear canal normal.  Nose: Mucosal edema and rhinorrhea present.  Mouth/Throat: Uvula is midline and mucous membranes are normal. No trismus in the jaw. No uvula swelling. Posterior oropharyngeal erythema present. No oropharyngeal exudate, posterior oropharyngeal edema or tonsillar abscesses.  Eyes: Conjunctivae are normal.  Neck: Normal range of motion and phonation normal. Neck supple. No Brudzinski's sign and no Kernig's sign noted.  Cardiovascular: Normal rate, regular rhythm, normal heart sounds and intact distal pulses.   No murmur heard. Pulmonary/Chest: Effort normal and breath sounds normal. No respiratory distress. She has no wheezes. She has no rales.  Abdominal: Soft. She exhibits no distension. There is no tenderness. There is no rebound and no guarding.  Musculoskeletal: She exhibits no edema.  Lymphadenopathy:    She has no cervical adenopathy.  Neurological: She is alert and oriented to person, place, and time. She exhibits normal muscle tone. Coordination normal.  Skin: Skin is warm and dry.    ED Course  Procedures (including critical care time) Labs Review Labs Reviewed - No data to display Imaging Review No results found.  EKG Interpretation   None       MDM    Patient is well appearing.  Ambulates w/o difficulty.  Patient is [redacted] weeks pregnant.  No abd pain, vomiting , LE edema, or vaginal bleeding.  Recived omincef from the urgent care earlier today , has only taken one dose.  HAs appt with [facility]  on Dec 31.  Appears stable for discharge.  Agrees to fluids, rest, and tylenol  [place] Frieden L. Trisha Mangle, PA-C 07/02/13 1610

## 2013-06-30 ENCOUNTER — Other Ambulatory Visit: Payer: Self-pay | Admitting: Obstetrics & Gynecology

## 2013-07-01 ENCOUNTER — Ambulatory Visit (INDEPENDENT_AMBULATORY_CARE_PROVIDER_SITE_OTHER): Payer: Medicaid Other

## 2013-07-01 ENCOUNTER — Ambulatory Visit (INDEPENDENT_AMBULATORY_CARE_PROVIDER_SITE_OTHER): Payer: Medicaid Other | Admitting: Advanced Practice Midwife

## 2013-07-01 ENCOUNTER — Encounter: Payer: Self-pay | Admitting: Advanced Practice Midwife

## 2013-07-01 ENCOUNTER — Other Ambulatory Visit: Payer: Self-pay | Admitting: Adult Health

## 2013-07-01 VITALS — BP 108/60 | Wt 154.0 lb

## 2013-07-01 DIAGNOSIS — O360990 Maternal care for other rhesus isoimmunization, unspecified trimester, not applicable or unspecified: Secondary | ICD-10-CM

## 2013-07-01 DIAGNOSIS — O09299 Supervision of pregnancy with other poor reproductive or obstetric history, unspecified trimester: Secondary | ICD-10-CM

## 2013-07-01 DIAGNOSIS — O43899 Other placental disorders, unspecified trimester: Secondary | ICD-10-CM

## 2013-07-01 DIAGNOSIS — O368990 Maternal care for other specified fetal problems, unspecified trimester, not applicable or unspecified: Secondary | ICD-10-CM

## 2013-07-01 DIAGNOSIS — Z331 Pregnant state, incidental: Secondary | ICD-10-CM

## 2013-07-01 DIAGNOSIS — Z3481 Encounter for supervision of other normal pregnancy, first trimester: Secondary | ICD-10-CM

## 2013-07-01 DIAGNOSIS — O99340 Other mental disorders complicating pregnancy, unspecified trimester: Secondary | ICD-10-CM

## 2013-07-01 DIAGNOSIS — F419 Anxiety disorder, unspecified: Secondary | ICD-10-CM

## 2013-07-01 DIAGNOSIS — Z3490 Encounter for supervision of normal pregnancy, unspecified, unspecified trimester: Secondary | ICD-10-CM

## 2013-07-01 DIAGNOSIS — Z1389 Encounter for screening for other disorder: Secondary | ICD-10-CM

## 2013-07-01 LAB — POCT URINALYSIS DIPSTICK
Glucose, UA: NEGATIVE
Ketones, UA: NEGATIVE
Leukocytes, UA: NEGATIVE
Nitrite, UA: NEGATIVE
Protein, UA: NEGATIVE

## 2013-07-01 LAB — CBC
HCT: 37.9 % (ref 36.0–46.0)
Hemoglobin: 12.7 g/dL (ref 12.0–15.0)
MCH: 29.8 pg (ref 26.0–34.0)
MCHC: 33.5 g/dL (ref 30.0–36.0)
MCV: 89 fL (ref 78.0–100.0)
Platelets: 196 10*3/uL (ref 150–400)
RBC: 4.26 MIL/uL (ref 3.87–5.11)
RDW: 13.6 % (ref 11.5–15.5)
WBC: 7.9 10*3/uL (ref 4.0–10.5)

## 2013-07-01 MED ORDER — BUSPIRONE HCL 15 MG PO TABS: ORAL_TABLET | ORAL | Status: DC

## 2013-07-01 MED ORDER — ALPRAZOLAM 0.25 MG PO TABS: 0.2500 mg | ORAL_TABLET | Freq: Every evening | ORAL | Status: DC | PRN

## 2013-07-01 NOTE — Progress Notes (Signed)
  Subjective:    Meagan [name] is a [id number] [redacted]w[redacted]d being seen today for her first obstetrical visit.  Her obstetrical history is significant for pyelonephritis first pregnancy,short interval b/t pregnancies (12 months).  Pregnancy history fully reviewed. 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 Has recently started seeing a therapist, working toward seeing a psychiatrist.  Weaning off of xanax.  Will start Buspar.   Filed Vitals:   07/01/13 1325  BP: 108/60  Weight: 154 lb (69.854 kg)    HISTORY: OB History  Gravida Para Term Preterm AB SAB TAB Ectopic Multiple Living  3 1 1  0 1 1 0 0 0 1    # Outcome Date GA Lbr Len/2nd Weight Sex Delivery Anes PTL Lv  3 CUR           2 TRM 05/30/12 [redacted]w[redacted]d 15:31 / 04:14 7 lb 14 oz (3.572 kg) M SVD EPI  Y  1 SAB              Past Medical History  Diagnosis Date  . Asthma   . Migraine   . Anxiety   . Mental disorder     huffed inhalants "affected brain"  . Stomach ulcer   . Diarrhea 06/04/2013  . Sore throat 06/04/2013  . Nausea 06/04/2013  . Pregnant 06/04/2013  . Kidney stones   . Pyelonephritis complicating pregnancy, antepartum, first trimester 11/05/2011   History reviewed. No pertinent past surgical history. Family History  Problem Relation Age of Onset  . Cancer Maternal Grandmother     breast  . Cancer Paternal Grandfather     lung cancer  . Hypertension Other      Exam                                           Skin: normal coloration and turgor, no rashes    Neurologic: oriented, normal, normal mood   Extremities: normal strength, tone, and muscle mass   HEENT PERRLA   Mouth/Teeth mucous membranes moist, pharynx normal without lesions   Neck supple and no masses   Cardiovascular: regular rate and rhythm   Respiratory:  appears well, vitals normal, no respiratory distress, acyanotic, normal RR   Abdomen: soft, non-tender; bowel sounds normal; no masses,  no organomegaly           Assessment:    Pregnancy: G3P1011 Patient Active Problem List   Diagnosis Date Noted  . Anxiety 06/17/2013  . Diarrhea 06/04/2013  . Sore throat 06/04/2013  . Nausea 06/04/2013  . Pregnant 06/04/2013        Plan:    Xanax 0.25mg  BID x 2 weeks, then decrease to 1 QD; start Buspar 7.5mg  BID X 1 week, then 15mg  BID Initial labs drawn. Prenatal vitamins. Problem list reviewed and updated. Genetic Screening discussed Integrated Screen: requested.  Ultrasound discussed; fetal survey: requested.  Follow up in 4 weeks.  CRESENZO-DISHMAN,Shawnita Krizek 07/01/2013

## 2013-07-01 NOTE — Progress Notes (Signed)
U/S(8+1wks)-transabdominal u/s performed, single IUP with +FCA noted, FHR-175bpm, cx appears closed (3.9cm), bilateral adnexa appears WNL, Sub-Chorionic Hemorrhage remains measures 2.9 x 2.6 x 0.5cm on today's exam, CRL c/w dates

## 2013-07-02 ENCOUNTER — Emergency Department (HOSPITAL_COMMUNITY)
Admission: EM | Admit: 2013-07-02 | Discharge: 2013-07-02 | Disposition: A | Discharge status: Home / Self Care | Payer: Medicaid Other | Attending: Emergency Medicine | Admitting: Emergency Medicine

## 2013-07-02 ENCOUNTER — Encounter (HOSPITAL_COMMUNITY): Payer: Self-pay | Admitting: Emergency Medicine

## 2013-07-02 DIAGNOSIS — R5381 Other malaise: Secondary | ICD-10-CM | POA: Insufficient documentation

## 2013-07-02 DIAGNOSIS — R51 Headache: Secondary | ICD-10-CM

## 2013-07-02 DIAGNOSIS — F411 Generalized anxiety disorder: Secondary | ICD-10-CM | POA: Insufficient documentation

## 2013-07-02 DIAGNOSIS — Z79899 Other long term (current) drug therapy: Secondary | ICD-10-CM | POA: Insufficient documentation

## 2013-07-02 DIAGNOSIS — O9989 Other specified diseases and conditions complicating pregnancy, childbirth and the puerperium: Secondary | ICD-10-CM | POA: Insufficient documentation

## 2013-07-02 DIAGNOSIS — R5383 Other fatigue: Secondary | ICD-10-CM | POA: Insufficient documentation

## 2013-07-02 DIAGNOSIS — R519 Headache, unspecified: Secondary | ICD-10-CM

## 2013-07-02 DIAGNOSIS — O99330 Smoking (tobacco) complicating pregnancy, unspecified trimester: Secondary | ICD-10-CM | POA: Insufficient documentation

## 2013-07-02 DIAGNOSIS — Z8711 Personal history of peptic ulcer disease: Secondary | ICD-10-CM | POA: Insufficient documentation

## 2013-07-02 DIAGNOSIS — R200 Anesthesia of skin: Secondary | ICD-10-CM

## 2013-07-02 DIAGNOSIS — R209 Unspecified disturbances of skin sensation: Secondary | ICD-10-CM | POA: Insufficient documentation

## 2013-07-02 DIAGNOSIS — Z87442 Personal history of urinary calculi: Secondary | ICD-10-CM | POA: Insufficient documentation

## 2013-07-02 DIAGNOSIS — J45909 Unspecified asthma, uncomplicated: Secondary | ICD-10-CM | POA: Insufficient documentation

## 2013-07-02 DIAGNOSIS — G43909 Migraine, unspecified, not intractable, without status migrainosus: Secondary | ICD-10-CM | POA: Insufficient documentation

## 2013-07-02 DIAGNOSIS — Z880 Allergy status to penicillin: Secondary | ICD-10-CM | POA: Insufficient documentation

## 2013-07-02 DIAGNOSIS — O99340 Other mental disorders complicating pregnancy, unspecified trimester: Secondary | ICD-10-CM | POA: Insufficient documentation

## 2013-07-02 LAB — ABO AND RH: Rh Type: NEGATIVE

## 2013-07-02 LAB — RPR

## 2013-07-02 LAB — URINALYSIS
Bilirubin Urine: NEGATIVE
Glucose, UA: NEGATIVE mg/dL
Ketones, ur: NEGATIVE mg/dL
Leukocytes, UA: NEGATIVE
Nitrite: NEGATIVE
Protein, ur: NEGATIVE mg/dL
Specific Gravity, Urine: 1.023 (ref 1.005–1.030)
Urobilinogen, UA: 0.2 mg/dL (ref 0.0–1.0)
pH: 6 (ref 5.0–8.0)

## 2013-07-02 LAB — URINE CULTURE
Colony Count: NO GROWTH
Organism ID, Bacteria: NO GROWTH

## 2013-07-02 LAB — RUBELLA SCREEN: Rubella: 1.25 Index — ABNORMAL HIGH (ref <0.90)

## 2013-07-02 LAB — GC/CHLAMYDIA PROBE AMP
CT Probe RNA: NEGATIVE
GC Probe RNA: NEGATIVE

## 2013-07-02 LAB — HIV ANTIBODY (ROUTINE TESTING W REFLEX): HIV: NONREACTIVE

## 2013-07-02 LAB — VARICELLA ZOSTER ANTIBODY, IGG: Varicella IgG: 39.79 Index (ref <135.00)

## 2013-07-02 LAB — HEPATITIS B SURFACE ANTIGEN: Hepatitis B Surface Ag: NEGATIVE

## 2013-07-02 LAB — ANTIBODY SCREEN: Antibody Screen: NEGATIVE

## 2013-07-02 MED ORDER — SODIUM CHLORIDE 0.9 % IV BOLUS (SEPSIS)
1000.0000 mL | Freq: Once | INTRAVENOUS | Status: AC
  Administered 2013-07-02: 1000 mL via INTRAVENOUS

## 2013-07-02 MED ORDER — METOCLOPRAMIDE HCL 5 MG/ML IJ SOLN
10.0000 mg | Freq: Once | INTRAMUSCULAR | Status: DC
  Filled 2013-07-02: qty 2

## 2013-07-02 MED ORDER — METOCLOPRAMIDE HCL 10 MG PO TABS: 10.0000 mg | ORAL_TABLET | Freq: Four times a day (QID) | ORAL | Status: DC

## 2013-07-02 NOTE — ED Notes (Signed)
For discharge, pt will f/u with MRI tomorrow 12:15pm [facility] outpt rad.

## 2013-07-02 NOTE — L&D Delivery Note (Signed)
Delivery Note At 7:26 AM a viable female was delivered via Vaginal, Spontaneous Delivery (Presentation: ; Occiput Anterior).  APGAR: 8, 9; weight 6 lb 12.1 oz (3065 g).   Placenta status: Intact, Spontaneous.  Cord: 3 vessels with the following complications: None.   Anesthesia: Epidural  Episiotomy: None Lacerations: None Suture Repair: None Est. Blood Loss (mL): 300  Mom to postpartum.  Baby to Couplet care / Skin to Skin.  Jager Koska G 02/14/2014, 9:12 AM

## 2013-07-02 NOTE — Discharge Instructions (Signed)
If you were given medicines take as directed.  If you are on coumadin or contraceptives realize their levels and effectiveness is altered by many different medicines.  If you have any reaction (rash, tongues swelling, other) to the medicines stop taking and see a physician.   Please follow up as directed and return to the ER or see a physician for new or worsening symptoms.  Thank you. Have your MRI done in the morning, any recurrent symptoms return to ER.

## 2013-07-02 NOTE — ED Provider Notes (Signed)
CSN: [id number]     Arrival date & time 07/02/13  1432 History  This chart was scribed for Enid Skeens, MD by Bennett Scrape, ED Scribe. This patient was seen in room APA14/APA14 and the patient's care was started at [facility] PM.   Chief Complaint  Patient presents with  . Migraine    The history is provided by the patient. No language interpreter was used.    HPI Comments: Meagan [name] is a 23 y.o. female who is 2 months pregnant currently presents to the Emergency Department complaining of a visual disturbance described as a blacking out of peripheral vision while cleaning her house. She states that the episode began 2 hours ago and lasted about 30 minutes. She states that she was evaluated by EMS at the time and she sent them off due to the symptoms resolving. She states that she took 2 Tylenol due to a rapidly developing HA (hit it's full intensity after 45 minutes)  and tried to relax when she noted left sided numbness. She states that it started in her fingertips and gradually radiated up into her left elbow. After that, she began having left leg numbness and decreased strength along with facial numbness in the left upper lip, left lower lip and left upper roof of the mouth. She called EMS again and asked to be transported to the ED due to the symptoms. She denies numbness on the left side currently but reports that her right arm feels weak. She reports a prior h/o migraines and reports mild similarities. She reports prior episodes of visual disturbances while pregnant with her first child. She reports one prior episode during this pregnancy due to HTN. She denies similarities stating that her vision "whited out" and her hearing decreased. She denies any vaginal bleeding or neck pain. She denies any prior h/o DVT/PE or new leg swelling or pain. She denies any recent surgeries in the past 5 weeks. She is unsure of any h/o family clotting disorders.   Past Medical History  Diagnosis Date  .  Asthma   . Migraine   . Anxiety   . Mental disorder     huffed inhalants "affected brain"  . Stomach ulcer   . Diarrhea 06/04/2013  . Sore throat 06/04/2013  . Nausea 06/04/2013  . Pregnant 06/04/2013  . Kidney stones   . Pyelonephritis complicating pregnancy, antepartum, first trimester 11/05/2011   No past surgical history on file. Family History  Problem Relation Age of Onset  . Cancer Maternal Grandmother     breast  . Cancer Paternal Grandfather     lung cancer  . Hypertension Other    History  Substance Use Topics  . Smoking status: Current Every Day Smoker -- 1.00 packs/day for 4 years    Types: Cigarettes  . Smokeless tobacco: Never Used  . Alcohol Use: No   OB History   Grav Para Term Preterm Abortions TAB SAB Ect Mult Living   3 1 1  0 1 0 1 0 0 1     Review of Systems  Constitutional: Negative for fever and chills.  Eyes: Positive for visual disturbance (resolved ).  Cardiovascular: Negative for chest pain and leg swelling.  Genitourinary: Negative for vaginal bleeding.  Musculoskeletal: Negative for back pain and neck pain.  Neurological: Positive for weakness, numbness (resolved ) and headaches.  All other systems reviewed and are negative.    Allergies  Codeine and Penicillins  Home Medications   Current Outpatient Rx  Name  Route  Sig  Dispense  Refill  . acetaminophen (TYLENOL) 500 MG tablet   Oral   Take 500 mg by mouth every 6 (six) hours as needed.         Marland Kitchen. albuterol (PROVENTIL HFA;VENTOLIN HFA) 108 (90 BASE) MCG/ACT inhaler   Inhalation   Inhale 2 puffs into the lungs every 4 (four) hours as needed. For coughing         . ALPRAZolam (XANAX) 0.25 MG tablet   Oral   Take 0.25 mg by mouth 2 (two) times daily.         Marland Kitchen. ALPRAZolam (XANAX) 0.25 MG tablet   Oral   Take 1 tablet (0.25 mg total) by mouth at bedtime as needed for anxiety.   30 tablet   0   . busPIRone (BUSPAR) 15 MG tablet      Take 1/2 tablet twice a day for 7  days, then 1 tablet BID qd   1 tablet   4   . cefdinir (OMNICEF) 300 MG capsule   Oral   Take 300 mg by mouth 2 (two) times daily. 10 day course starting on 06/29/2013         . ondansetron (ZOFRAN) 8 MG tablet   Oral   Take 1 tablet (8 mg total) by mouth every 8 (eight) hours as needed for nausea or vomiting.   20 tablet   1   . phenylephrine (SUDAFED PE) 10 MG TABS tablet   Oral   Take 10 mg by mouth every 4 (four) hours as needed.         . prenatal vitamin w/FE, FA (PRENATAL 1 + 1) 27-1 MG TABS tablet   Oral   Take 1 tablet by mouth at bedtime.          Triage Vitals: BP 116/55  Pulse 85  Temp(Src) 98.2 F (36.8 C) (Oral)  Resp 18  Ht 5\' 2"  (1.575 m)  Wt 156 lb (70.761 kg)  BMI 28.53 kg/m2  SpO2 99%  LMP 04/15/2013  Physical Exam  Nursing note and vitals reviewed. Constitutional: She is oriented to person, place, and time. She appears well-developed and well-nourished. No distress.  HENT:  Head: Normocephalic and atraumatic.  Tongue and uvula are midline   Eyes: Conjunctivae and EOM are normal. Pupils are equal, round, and reactive to light.  Vision fields are intact to fingers in the four quadrants   Neck: Normal range of motion. Neck supple. No tracheal deviation present.  Cardiovascular: Normal rate and regular rhythm.  Exam reveals no gallop and no friction rub.   No murmur heard. Pulmonary/Chest: Effort normal and breath sounds normal. No respiratory distress. She has no wheezes. She has no rales.  Abdominal: Soft. There is no tenderness.  Musculoskeletal: Normal range of motion. She exhibits no edema.  Moves all extremities appropriately   Neurological: She is alert and oriented to person, place, and time. No cranial nerve deficit (CNs are intact).  No pronator drift, finger to nose are intact, sensation and strength are grossly intact in the upper and lower extremities. 2+ reflexes in lower extremities. No clonus.  5+ strength in UE and LE with f/e  at major joints. Sensation to palpation intact in UE and LE. CNs 2-12 grossly intact.  EOMFI.  PERRL.   Finger nose and coordination intact bilateral.   Visual fields intact to finger testing.   Skin: Skin is warm and dry.  Psychiatric: She has a normal mood and affect. Her behavior  is normal.    ED Course  Procedures (including critical care time) EMERGENCY DEPARTMENT [name] PREGNANCY "Study: Limited Ultrasound of the Pelvis for Pregnancy"  INDICATIONS:Pregnancy(required)  nausea Multiple views of the uterus and pelvic cavity were obtained in real-time with a multi-frequency probe.  APPROACH:Transabdominal   PERFORMED BY: Myself  IMAGES ARCHIVED?: Yes  LIMITATIONS: Body habitus  PREGNANCY FREE FLUID: None  PREGNANCY FINDINGS: Intrauterine gestational sac noted and Fetal heart activity seen  INTERPRETATION: Viable intrauterine pregnancy   FETAL HEART RATE: 172      DIAGNOSTIC STUDIES: Oxygen Saturation is 99% on RA, normal by my interpretation.    COORDINATION OF CARE: 3:31 PM-Advised pt that her symptoms are resolved which is reassuring. She is requesting medication. Advised pt that any medications given at this point in her pregnancy caused cause the fetus harm. Pt verbalized her understanding and is still requesting medication. Discussed treatment plan which includes MRI of brain due to concern of pregnancy caused clots with pt at bedside and pt agreed to plan.   Labs Review Labs Reviewed - No data to display Imaging Review US Ob Comp Less 14 Wks  07/01/2013   DATING AND VIABILITY SONOGRAM   Meagan [name] is a 24 y.o. year old G3P1011 with EDD of 02/09/2014  which would correlate to  [redacted]w[redacted]d weeks gestation.  She is here today for a  f/u due to a sub-chorionic hemorrhage.    GESTATION: SINGLETON     FETAL ACTIVITY:          Heart rate         175 bpm          The fetus is active.  CERVIX: Measures 3.9 cm  ADNEXA: The ovaries are normal.   GESTATIONAL AGE AND   BIOMETRICS:  Gestational criteria: Estimated Date of Delivery: 02/09/14 by early  ultrasound now at [redacted]w[redacted]d  Previous Scans:2  GESTATIONAL SAC            mm          weeks  CROWN RUMP LENGTH           20.8 mm         8+4 weeks                                                                               AVERAGE EGA(BY THIS SCAN):   8+4 weeks  2.9 x 2.6 x 0.5cm sub-chorionic hemorrhage remains adjacent to gestational  sac     TECHNICIAN COMMENTS: U/S(8+1wks)-transabdominal u/s performed, single IUP with +FCA noted,  FHR-175bpm, cx appears closed (3.9cm), bilateral adnexa appears WNL,  Sub-Chorionic Hemorrhage remains measures 2.9 x 2.6 x 0.5cm on today's  exam, CRL c/w dates         A copy of this report including all images has been saved and backed up to  a second source for retrieval if needed. All measures and details of the  anatomical scan, placentation, fluid volume and pelvic anatomy are  contained in that report.  Chari Manning 07/01/2013 12:57 PM     EKG Interpretation   None       MDM   1. Headache   2. Left sided numbness  I personally performed the services described in this documentation, which was scribed in my presence. The recorded information has been reviewed and is accurate.  Clinically likely migraine with neuro sxs.  No sxs in ED.  Normal neuro exam. Pt observed, normal neuro on recheck. Rec MRV to rule out blood clots since pt is pregnant, unable to do on holiday but scheduled for am tomorrow, pt instructed how to come in for test. Rec screening CT head, pt has had once recent and with pregnancy and radiation risk refuses at this time. Well appearing. Bedside US live intrauterine preg.   Results and differential diagnosis were discussed with the patient. Close follow up outpatient was discussed, patient comfortable with the plan.   Diagnosis: above  Enid Skeens, MD 07/02/13 [phone]

## 2013-07-02 NOTE — ED Notes (Signed)
Onset of visual field loss, "I couldn't see my husband's face, I could see parts of him." headache right sided started shortly after, then anxiety increased and left arm went numb. Now tearful upon arrival but symptoms have resolved

## 2013-07-02 NOTE — L&D Delivery Note (Signed)
Attestation of Attending Supervision of Advanced Practitioner: Evaluation and management procedures were performed by the PA/NP/CNM/OB Fellow under my supervision/collaboration. Chart reviewed and agree with management and plan.  Joei Frangos V 02/19/2014 7:52 AM

## 2013-07-03 ENCOUNTER — Ambulatory Visit (HOSPITAL_COMMUNITY): Admission: RE | Admit: 2013-07-03 | Payer: Medicaid Other | Source: Ambulatory Visit

## 2013-07-03 ENCOUNTER — Other Ambulatory Visit (HOSPITAL_COMMUNITY): Payer: Self-pay | Admitting: Emergency Medicine

## 2013-07-03 ENCOUNTER — Inpatient Hospital Stay (HOSPITAL_COMMUNITY): Admit: 2013-07-03 | Payer: Medicaid Other

## 2013-07-03 ENCOUNTER — Ambulatory Visit (HOSPITAL_COMMUNITY)
Admit: 2013-07-03 | Discharge: 2013-07-03 | Disposition: A | Discharge status: Home / Self Care | Payer: Medicaid Other | Source: Ambulatory Visit | Attending: Emergency Medicine | Admitting: Emergency Medicine

## 2013-07-03 DIAGNOSIS — R51 Headache: Secondary | ICD-10-CM | POA: Insufficient documentation

## 2013-07-03 DIAGNOSIS — R29898 Other symptoms and signs involving the musculoskeletal system: Secondary | ICD-10-CM | POA: Insufficient documentation

## 2013-07-03 DIAGNOSIS — R519 Headache, unspecified: Secondary | ICD-10-CM

## 2013-07-03 DIAGNOSIS — R209 Unspecified disturbances of skin sensation: Secondary | ICD-10-CM | POA: Insufficient documentation

## 2013-07-03 DIAGNOSIS — O99891 Other specified diseases and conditions complicating pregnancy: Secondary | ICD-10-CM | POA: Insufficient documentation

## 2013-07-03 DIAGNOSIS — O9989 Other specified diseases and conditions complicating pregnancy, childbirth and the puerperium: Principal | ICD-10-CM

## 2013-07-04 LAB — DRUG SCREEN, URINE, NO CONFIRMATION
Amphetamine Screen, Ur: NEGATIVE
Barbiturate Quant, Ur: NEGATIVE
Benzodiazepines.: POSITIVE — AB
Cocaine Metabolites: NEGATIVE
Creatinine,U: 191.3 mg/dL
Marijuana Metabolite: NEGATIVE
Methadone: NEGATIVE
Opiate Screen, Urine: NEGATIVE
Phencyclidine (PCP): NEGATIVE
Propoxyphene: NEGATIVE

## 2013-07-04 LAB — OXYCODONE SCREEN, UA, RFLX CONFIRM: Oxycodone Screen, Ur: NEGATIVE ng/mL

## 2013-07-04 NOTE — ED Provider Notes (Signed)
  Medical screening examination/treatment/procedure(s) were performed by non-physician practitioner and as supervising physician I was immediately available for consultation/collaboration.  EKG Interpretation   None          Gail Creekmore, MD 07/04/13 0058 

## 2013-07-06 ENCOUNTER — Other Ambulatory Visit (HOSPITAL_COMMUNITY): Payer: Medicaid Other

## 2013-07-07 ENCOUNTER — Other Ambulatory Visit (HOSPITAL_COMMUNITY): Payer: Medicaid Other

## 2013-07-10 ENCOUNTER — Other Ambulatory Visit: Payer: Self-pay | Admitting: Obstetrics & Gynecology

## 2013-07-27 ENCOUNTER — Other Ambulatory Visit: Payer: Self-pay | Admitting: Advanced Practice Midwife

## 2013-07-27 DIAGNOSIS — Z36 Encounter for antenatal screening of mother: Secondary | ICD-10-CM

## 2013-07-29 ENCOUNTER — Ambulatory Visit (INDEPENDENT_AMBULATORY_CARE_PROVIDER_SITE_OTHER): Payer: Medicaid Other

## 2013-07-29 ENCOUNTER — Encounter: Payer: Medicaid Other | Admitting: Advanced Practice Midwife

## 2013-07-29 ENCOUNTER — Other Ambulatory Visit: Payer: Self-pay | Admitting: Obstetrics & Gynecology

## 2013-07-29 DIAGNOSIS — Z36 Encounter for antenatal screening of mother: Secondary | ICD-10-CM

## 2013-07-29 NOTE — Progress Notes (Signed)
U/S(12+1wks)-single active fetus, CRL c/w dates, cx appears closed, bilateral adnexa wnl, FHR- 160 bpm, NB present, posterior Gr 0 placenta, NT-1.5542mm

## 2013-07-30 ENCOUNTER — Encounter: Payer: Medicaid Other | Admitting: Advanced Practice Midwife

## 2013-08-01 ENCOUNTER — Emergency Department (HOSPITAL_COMMUNITY)
Admission: EM | Admit: 2013-08-01 | Discharge: 2013-08-01 | Disposition: A | Discharge status: Home / Self Care | Payer: Medicaid Other | Attending: Emergency Medicine | Admitting: Emergency Medicine

## 2013-08-01 ENCOUNTER — Encounter (HOSPITAL_COMMUNITY): Payer: Self-pay | Admitting: Emergency Medicine

## 2013-08-01 DIAGNOSIS — R5383 Other fatigue: Secondary | ICD-10-CM | POA: Insufficient documentation

## 2013-08-01 DIAGNOSIS — R42 Dizziness and giddiness: Secondary | ICD-10-CM | POA: Insufficient documentation

## 2013-08-01 DIAGNOSIS — R5381 Other malaise: Secondary | ICD-10-CM | POA: Insufficient documentation

## 2013-08-01 DIAGNOSIS — O210 Mild hyperemesis gravidarum: Secondary | ICD-10-CM | POA: Insufficient documentation

## 2013-08-01 DIAGNOSIS — Z79899 Other long term (current) drug therapy: Secondary | ICD-10-CM | POA: Insufficient documentation

## 2013-08-01 DIAGNOSIS — Z8711 Personal history of peptic ulcer disease: Secondary | ICD-10-CM | POA: Insufficient documentation

## 2013-08-01 DIAGNOSIS — F411 Generalized anxiety disorder: Secondary | ICD-10-CM | POA: Insufficient documentation

## 2013-08-01 DIAGNOSIS — O9989 Other specified diseases and conditions complicating pregnancy, childbirth and the puerperium: Secondary | ICD-10-CM | POA: Insufficient documentation

## 2013-08-01 DIAGNOSIS — Z880 Allergy status to penicillin: Secondary | ICD-10-CM | POA: Insufficient documentation

## 2013-08-01 DIAGNOSIS — R112 Nausea with vomiting, unspecified: Secondary | ICD-10-CM

## 2013-08-01 DIAGNOSIS — R1013 Epigastric pain: Secondary | ICD-10-CM | POA: Insufficient documentation

## 2013-08-01 DIAGNOSIS — Z87442 Personal history of urinary calculi: Secondary | ICD-10-CM | POA: Insufficient documentation

## 2013-08-01 DIAGNOSIS — Z3490 Encounter for supervision of normal pregnancy, unspecified, unspecified trimester: Secondary | ICD-10-CM

## 2013-08-01 DIAGNOSIS — O99330 Smoking (tobacco) complicating pregnancy, unspecified trimester: Secondary | ICD-10-CM | POA: Insufficient documentation

## 2013-08-01 DIAGNOSIS — Z8679 Personal history of other diseases of the circulatory system: Secondary | ICD-10-CM | POA: Insufficient documentation

## 2013-08-01 DIAGNOSIS — J45909 Unspecified asthma, uncomplicated: Secondary | ICD-10-CM | POA: Insufficient documentation

## 2013-08-01 DIAGNOSIS — O99340 Other mental disorders complicating pregnancy, unspecified trimester: Secondary | ICD-10-CM | POA: Insufficient documentation

## 2013-08-01 LAB — URINALYSIS, ROUTINE W REFLEX MICROSCOPIC
Bilirubin Urine: NEGATIVE
Glucose, UA: NEGATIVE mg/dL
Hgb urine dipstick: NEGATIVE
Ketones, ur: 40 mg/dL — AB
Leukocytes, UA: NEGATIVE
Nitrite: NEGATIVE
Protein, ur: NEGATIVE mg/dL
Specific Gravity, Urine: >1.03 — ABNORMAL HIGH (ref 1.005–1.030)
Urobilinogen, UA: 0.2 mg/dL (ref 0.0–1.0)
pH: 5.5 (ref 5.0–8.0)

## 2013-08-01 LAB — COMPREHENSIVE METABOLIC PANEL
ALT: 9 U/L (ref 0–35)
AST: 6 U/L (ref 0–37)
Albumin: 3 g/dL — ABNORMAL LOW (ref 3.5–5.2)
Alkaline Phosphatase: 55 U/L (ref 39–117)
BUN: 9 mg/dL (ref 6–23)
CO2: 21 mEq/L (ref 19–32)
Calcium: 8.1 mg/dL — ABNORMAL LOW (ref 8.4–10.5)
Chloride: 106 mEq/L (ref 96–112)
Creatinine, Ser: 0.6 mg/dL (ref 0.50–1.10)
GFR calc Af Amer: >90 mL/min (ref >90)
GFR calc non Af Amer: >90 mL/min (ref >90)
Glucose, Bld: 89 mg/dL (ref 70–99)
Potassium: 3.8 mEq/L (ref 3.7–5.3)
Sodium: 139 mEq/L (ref 137–147)
Total Bilirubin: 0.3 mg/dL (ref 0.3–1.2)
Total Protein: 6.3 g/dL (ref 6.0–8.3)

## 2013-08-01 LAB — CBC WITH DIFFERENTIAL/PLATELET
Basophils Absolute: 0 10*3/uL (ref 0.0–0.1)
Basophils Relative: 0 % (ref 0–1)
Eosinophils Absolute: 0 10*3/uL (ref 0.0–0.7)
Eosinophils Relative: 0 % (ref 0–5)
HCT: 37.4 % (ref 36.0–46.0)
Hemoglobin: 13 g/dL (ref 12.0–15.0)
Lymphocytes Relative: 8 % — ABNORMAL LOW (ref 12–46)
Lymphs Abs: 0.6 10*3/uL — ABNORMAL LOW (ref 0.7–4.0)
MCH: 30.7 pg (ref 26.0–34.0)
MCHC: 34.8 g/dL (ref 30.0–36.0)
MCV: 88.4 fL (ref 78.0–100.0)
Monocytes Absolute: 0.3 10*3/uL (ref 0.1–1.0)
Monocytes Relative: 3 % (ref 3–12)
Neutro Abs: 7.3 10*3/uL (ref 1.7–7.7)
Neutrophils Relative %: 89 % — ABNORMAL HIGH (ref 43–77)
Platelets: 154 10*3/uL (ref 150–400)
RBC: 4.23 MIL/uL (ref 3.87–5.11)
RDW: 13.2 % (ref 11.5–15.5)
WBC: 8.2 10*3/uL (ref 4.0–10.5)

## 2013-08-01 LAB — LIPASE, BLOOD: Lipase: 23 U/L (ref 11–59)

## 2013-08-01 MED ORDER — ONDANSETRON HCL 4 MG/2ML IJ SOLN
4.0000 mg | Freq: Once | INTRAMUSCULAR | Status: AC
  Administered 2013-08-01: 4 mg via INTRAVENOUS

## 2013-08-01 MED ORDER — ONDANSETRON HCL 4 MG/2ML IJ SOLN
4.0000 mg | Freq: Once | INTRAMUSCULAR | Status: DC
Start: 2013-08-01 — End: 2013-08-01
  Filled 2013-08-01: qty 2

## 2013-08-01 MED ORDER — SODIUM CHLORIDE 0.9 % IV SOLN: 1000.0000 mL | Freq: Once | INTRAVENOUS | Status: DC

## 2013-08-01 MED ORDER — SODIUM CHLORIDE 0.9 % IV SOLN: 1000.0000 mL | INTRAVENOUS | Status: DC

## 2013-08-01 MED ORDER — ONDANSETRON HCL 4 MG PO TABS: 4.0000 mg | ORAL_TABLET | Freq: Four times a day (QID) | ORAL | Status: DC

## 2013-08-01 MED ORDER — SODIUM CHLORIDE 0.9 % IV SOLN
1000.0000 mL | Freq: Once | INTRAVENOUS | Status: AC
  Administered 2013-08-01: 1000 mL via INTRAVENOUS

## 2013-08-01 MED ORDER — FAMOTIDINE IN NACL 20-0.9 MG/50ML-% IV SOLN
20.0000 mg | Freq: Once | INTRAVENOUS | Status: AC
  Administered 2013-08-01: 20 mg via INTRAVENOUS
  Filled 2013-08-01: qty 50

## 2013-08-01 NOTE — ED Notes (Signed)
Reporting improvement in nausea.  Tolerating p.o. intake.

## 2013-08-01 NOTE — Discharge Instructions (Signed)
Drink plenty of fluids. Use the zofran for nausea or vomiting. Avoid fried, spicy or greasy foods for the next several days. Recheck if you feel worse.  Nausea and Vomiting Nausea is a sick feeling that often comes before throwing up (vomiting). Vomiting is a reflex where stomach contents come out of your mouth. Vomiting can cause severe loss of body fluids (dehydration). Children and elderly adults can become dehydrated quickly, especially if they also have diarrhea. Nausea and vomiting are symptoms of a condition or disease. It is important to find the cause of your symptoms. CAUSES   Direct irritation of the stomach lining. This irritation can result from increased acid production (gastroesophageal reflux disease), infection, food poisoning, taking certain medicines (such as nonsteroidal anti-inflammatory drugs), alcohol use, or tobacco use.  Signals from the brain.These signals could be caused by a headache, heat exposure, an inner ear disturbance, increased pressure in the brain from injury, infection, a tumor, or a concussion, pain, emotional stimulus, or metabolic problems.  An obstruction in the gastrointestinal tract (bowel obstruction).  Illnesses such as diabetes, hepatitis, gallbladder problems, appendicitis, kidney problems, cancer, sepsis, atypical symptoms of a heart attack, or eating disorders.  Medical treatments such as chemotherapy and radiation.  Receiving medicine that makes you sleep (general anesthetic) during surgery. DIAGNOSIS Your caregiver may ask for tests to be done if the problems do not improve after a few days. Tests may also be done if symptoms are severe or if the reason for the nausea and vomiting is not clear. Tests may include:  Urine tests.  Blood tests.  Stool tests.  Cultures (to look for evidence of infection).  X-rays or other imaging studies. Test results can help your caregiver make decisions about treatment or the need for additional  tests. TREATMENT You need to stay well hydrated. Drink frequently but in small amounts.You may wish to drink water, sports drinks, clear broth, or eat frozen ice pops or gelatin dessert to help stay hydrated.When you eat, eating slowly may help prevent nausea.There are also some antinausea medicines that may help prevent nausea. HOME CARE INSTRUCTIONS   Take all medicine as directed by your caregiver.  If you do not have an appetite, do not force yourself to eat. However, you must continue to drink fluids.  If you have an appetite, eat a normal diet unless your caregiver tells you differently.  Eat a variety of complex carbohydrates (rice, wheat, potatoes, bread), lean meats, yogurt, fruits, and vegetables.  Avoid high-fat foods because they are more difficult to digest.  Drink enough water and fluids to keep your urine clear or pale yellow.  If you are dehydrated, ask your caregiver for specific rehydration instructions. Signs of dehydration may include:  Severe thirst.  Dry lips and mouth.  Dizziness.  Dark urine.  Decreasing urine frequency and amount.  Confusion.  Rapid breathing or pulse. SEEK IMMEDIATE MEDICAL CARE IF:   You have blood or brown flecks (like coffee grounds) in your vomit.  You have black or bloody stools.  You have a severe headache or stiff neck.  You are confused.  You have severe abdominal pain.  You have chest pain or trouble breathing.  You do not urinate at least once every 8 hours.  You develop cold or clammy skin.  You continue to vomit for longer than 24 to 48 hours.  You have a fever. MAKE SURE YOU:   Understand these instructions.  Will watch your condition.  Will get help right away if  you are not doing well or get worse. Document Released: 06/18/2005 Document Revised: 09/10/2011 Document Reviewed: 11/15/2010 [facility] Patient Information 2014 [place], [place].

## 2013-08-01 NOTE — ED Provider Notes (Signed)
CSN: [id number]     Arrival date & time 08/01/13  1442 History  This chart was scribed for  by Joaquin Music, ED Scribe. This patient was seen in room APA04/APA04 and the patient's care was started at 3:47 PM  Chief Complaint  Patient presents with  . Emesis   Patient is a 23 y.o. female presenting with vomiting. The history is provided by the patient. No language interpreter was used.  Emesis  HPI Comments: Meagan [name] is a 23 y.o. female who presents to the Emergency Department complaining of ongoing episodes of emesis with epigastric pain with weak and dizzy feeling that began last night. Mother states she began having an episode of emesis shortly after eating pizza. Pt states yesterday evening she ate at another fast food restaurant earlier in the day but states she was not "feeling sick". Pt states she drank a large cup of ice water yesterday evening after she first got ill and shortly after began having an episode of emesis that consisted of the water. Pt states her epigastric pain is dull, sore, and achy but burning when she has an episode of emesis. Pt states she was unable to sleep due to having 10-20 episodes of emesis. Pt denies seeing any blood. She states she has been having normal BM today. Pt states she has not been able to urinate today. Pt states she is still smoking but states she is not smoking "as much" today. Pt denies diarrhea, fever, and chills.She states she feels weak and dizzy. Her son is in the ED for diarrhea for the past 2 weeks. Otherwise she hasn't been around anyone who is ill.   Pts PCP is at [facility].   Pt states she is currently 3.5 months/13-[redacted] weeks pregnant with her second child.  PT is P2G1Ab0. Pt states her OB/GYN is [facility]. Pt denies having any complications with her pregnancy or vaginal bleeding  Past Medical History  Diagnosis Date  . Asthma   . Migraine   . Anxiety   . Mental disorder     huffed inhalants  "affected brain"  . Stomach ulcer   . Diarrhea 06/04/2013  . Sore throat 06/04/2013  . Nausea 06/04/2013  . Pregnant 06/04/2013  . Kidney stones   . Pyelonephritis complicating pregnancy, antepartum, first trimester 11/05/2011   History reviewed. No pertinent past surgical history. Family History  Problem Relation Age of Onset  . Cancer Maternal Grandmother     breast  . Cancer Paternal Grandfather     lung cancer  . Hypertension Other    History  Substance Use Topics  . Smoking status: Current Every Day Smoker -- 1.00 packs/day for 4 years    Types: Cigarettes  . Smokeless tobacco: Never Used  . Alcohol Use: No  lives with spouse unemployed  OB History   Grav Para Term Preterm Abortions TAB SAB Ect Mult Living   3 1 1  0 1 0 1 0 0 1     Review of Systems  Gastrointestinal: Positive for vomiting.  All other systems reviewed and are negative.    Allergies  Codeine and Penicillins  Home Medications   Current Outpatient Rx  Name  Route  Sig  Dispense  Refill  . acetaminophen (TYLENOL) 500 MG tablet   Oral   Take 1,000 mg by mouth every 6 (six) hours as needed. pain         . albuterol (PROVENTIL HFA;VENTOLIN HFA) 108 (90 BASE) MCG/ACT inhaler   Inhalation  Inhale 2 puffs into the lungs every 4 (four) hours as needed. For coughing         . ALPRAZolam (XANAX) 0.5 MG tablet   Oral   Take 0.25 mg by mouth 2 (two) times daily.         . prenatal vitamin w/FE, FA (PRENATAL 1 + 1) 27-1 MG TABS tablet   Oral   Take 1 tablet by mouth daily.          Triage Vitals:BP 116/64  Pulse 98  Temp(Src) 98.2 F (36.8 C) (Oral)  Resp 16  Ht 5\' 2"  (1.575 m)  Wt 130 lb (58.968 kg)  BMI 23.77 kg/m2  SpO2 98%  LMP 04/15/2013  Vital signs normal    Physical Exam  Nursing note and vitals reviewed. Constitutional: She is oriented to person, place, and time. She appears well-developed and well-nourished.  Non-toxic appearance. She does not appear ill. No distress.   HENT:  Head: Normocephalic and atraumatic.  Right Ear: External ear normal.  Left Ear: External ear normal.  Nose: Nose normal. No mucosal edema or rhinorrhea.  Mouth/Throat: Oropharynx is clear and moist and mucous membranes are normal. No dental abscesses or uvula swelling.  Mildly dry mucous membranes.  Eyes: Conjunctivae and EOM are normal. Pupils are equal, round, and reactive to light.  Neck: Normal range of motion and full passive range of motion without pain. Neck supple.  Cardiovascular: Normal rate, regular rhythm and normal heart sounds.  Exam reveals no gallop and no friction rub.   No murmur heard. Pulmonary/Chest: Effort normal and breath sounds normal. No respiratory distress. She has no wheezes. She has no rhonchi. She has no rales. She exhibits no tenderness and no crepitus.  Abdominal: Soft. Normal appearance and bowel sounds are normal. She exhibits no distension. There is no tenderness. There is no rebound and no guarding.  Mild epigastric tenderness on exam.  Musculoskeletal: Normal range of motion. She exhibits no edema and no tenderness.  Moves all extremities well.   Neurological: She is alert and oriented to person, place, and time. She has normal strength. No cranial nerve deficit.  Skin: Skin is warm, dry and intact. No rash noted. No erythema. No pallor.  Psychiatric: She has a normal mood and affect. Her speech is normal and behavior is normal. Her mood appears not anxious.    ED Course  Procedures  Medications  0.9 %  sodium chloride infusion (0 mLs Intravenous Stopped 08/01/13 1821)    Followed by  0.9 %  sodium chloride infusion (1,000 mLs Intravenous New Bag/Given 08/01/13 1821)    Followed by  0.9 %  sodium chloride infusion (not administered)  0.9 %  sodium chloride infusion (not administered)    Followed by  0.9 %  sodium chloride infusion (not administered)  famotidine (PEPCID) IVPB 20 mg (0 mg Intravenous Stopped 08/01/13 1721)  ondansetron  (ZOFRAN) injection 4 mg (4 mg Intravenous Given 08/01/13 1640)    DIAGNOSTIC STUDIES: Oxygen Saturation is 98% on RA, normal by my interpretation.    COORDINATION OF CARE: 3:54 PM-Discussed treatment plan which includes blood work and administer IV fluids while in ED. Pt agreed to plan.    5:27 PM-Pt appears better. Informed patient of lab findings and will continue IV fluids. Patient agreed to tx.  6:58 PM- Pt appears to be improving. Pt has had 3 bags of IV fluids. Patient agreed to plan. Pt started to take oral fluids.   Labs Review Results for orders  placed during the hospital encounter of 08/01/13  CBC WITH DIFFERENTIAL      Result Value Range   WBC 8.2  4.0 - 10.5 K/uL   RBC 4.23  3.87 - 5.11 MIL/uL   Hemoglobin 13.0  12.0 - 15.0 g/dL   HCT 16.1  09.6 - 04.5 %   MCV 88.4  78.0 - 100.0 fL   MCH 30.7  26.0 - 34.0 pg   MCHC 34.8  30.0 - 36.0 g/dL   RDW 40.9  81.1 - 91.4 %   Platelets 154  150 - 400 K/uL   Neutrophils Relative % 89 (*) 43 - 77 %   Neutro Abs 7.3  1.7 - 7.7 K/uL   Lymphocytes Relative 8 (*) 12 - 46 %   Lymphs Abs 0.6 (*) 0.7 - 4.0 K/uL   Monocytes Relative 3  3 - 12 %   Monocytes Absolute 0.3  0.1 - 1.0 K/uL   Eosinophils Relative 0  0 - 5 %   Eosinophils Absolute 0.0  0.0 - 0.7 K/uL   Basophils Relative 0  0 - 1 %   Basophils Absolute 0.0  0.0 - 0.1 K/uL  COMPREHENSIVE METABOLIC PANEL      Result Value Range   Sodium 139  137 - 147 mEq/L   Potassium 3.8  3.7 - 5.3 mEq/L   Chloride 106  96 - 112 mEq/L   CO2 21  19 - 32 mEq/L   Glucose, Bld 89  70 - 99 mg/dL   BUN 9  6 - 23 mg/dL   Creatinine, Ser 7.82  0.50 - 1.10 mg/dL   Calcium 8.1 (*) 8.4 - 10.5 mg/dL   Total Protein 6.3  6.0 - 8.3 g/dL   Albumin 3.0 (*) 3.5 - 5.2 g/dL   AST 6  0 - 37 U/L   ALT 9  0 - 35 U/L   Alkaline Phosphatase 55  39 - 117 U/L   Total Bilirubin 0.3  0.3 - 1.2 mg/dL   GFR calc non Af Amer >90  >90 mL/min   GFR calc Af Amer >90  >90 mL/min  LIPASE, BLOOD      Result Value  Range   Lipase 23  11 - 59 U/L  URINALYSIS, ROUTINE W REFLEX MICROSCOPIC      Result Value Range   Color, Urine YELLOW  YELLOW   APPearance HAZY (*) CLEAR   Specific Gravity, Urine >1.030 (*) 1.005 - 1.030   pH 5.5  5.0 - 8.0   Glucose, UA NEGATIVE  NEGATIVE mg/dL   Hgb urine dipstick NEGATIVE  NEGATIVE   Bilirubin Urine NEGATIVE  NEGATIVE   Ketones, ur 40 (*) NEGATIVE mg/dL   Protein, ur NEGATIVE  NEGATIVE mg/dL   Urobilinogen, UA 0.2  0.0 - 1.0 mg/dL   Nitrite NEGATIVE  NEGATIVE   Leukocytes, UA NEGATIVE  NEGATIVE   Laboratory interpretation all normal   Imaging Review No results found.   Mr Brain Wo Contrast  07/03/2013   CLINICAL DATA:  Eight weeks pregnant. Left-sided body numbness. Right sided weakness. Symptoms have now resolved. IMPRESSION: Negative noncontrast cranial MRI.   Electronically Signed   By: Davonna Belling M.D.   On: 07/03/2013 14:47   US Fetal Nuchal Translucency Measurement  07/29/2013   NUCHAL TRANSLUCENCY FOR INTEGRATED TESTING   Meagan [name] is in the office for nuchal translucency sonogram as  part of an integrated screen  TECHNICIAN COMMENTS:  U/S(12+1wks)-single active fetus, CRL c/w dates, cx appears  closed,  bilateral adnexa wnl, FHR- 160 bpm, NB present, posterior Gr 0 placenta,  NT-1.6642mm   The patient will have the first blood draw of her integrated screening  today and the second draw in approximately 4 weeks.  Chari ManningMcBride, Tasha 07/29/2013 10:52 AM                                                          Mr Mrv Head Wo Cm  07/03/2013   CLINICAL DATA:  Headache. Left-sided numbness and right-sided body weakness occurring yesterday, now resolved. Eight weeks pregnant.   IMPRESSION: Negative MR venography of the intracranial circulation.   Electronically Signed   By: Davonna BellingJohn  Curnes M.D.   On: 07/03/2013 14:50      EKG Interpretation   None       MDM   1. Nausea and vomiting   2. Pregnancy     New Prescriptions   ONDANSETRON (ZOFRAN) 4 MG  TABLET    Take 1 tablet (4 mg total) by mouth every 6 (six) hours.    Plan discharge    I personally performed the services described in this documentation, which was scribed in my presence. The recorded information has been reviewed and considered.  Devoria AlbeIva Treylan Mcclintock, MD, FACEP    Ward GivensIva L Rosalia Mcavoy, MD 08/02/13 [phone]

## 2013-08-01 NOTE — ED Notes (Signed)
Upper abd pain with vomiting starting yesterday.  Denies diarrhea.

## 2013-08-03 ENCOUNTER — Encounter: Payer: Medicaid Other | Admitting: Women's Health

## 2013-08-03 LAB — MATERNAL SCREEN, INTEGRATED #1

## 2013-08-11 ENCOUNTER — Ambulatory Visit (INDEPENDENT_AMBULATORY_CARE_PROVIDER_SITE_OTHER): Payer: Medicaid Other | Admitting: Advanced Practice Midwife

## 2013-08-11 ENCOUNTER — Encounter: Payer: Self-pay | Admitting: Advanced Practice Midwife

## 2013-08-11 ENCOUNTER — Telehealth: Payer: Self-pay | Admitting: Obstetrics and Gynecology

## 2013-08-11 VITALS — BP 116/60 | Wt 145.0 lb

## 2013-08-11 DIAGNOSIS — O360990 Maternal care for other rhesus isoimmunization, unspecified trimester, not applicable or unspecified: Secondary | ICD-10-CM

## 2013-08-11 DIAGNOSIS — O09299 Supervision of pregnancy with other poor reproductive or obstetric history, unspecified trimester: Secondary | ICD-10-CM

## 2013-08-11 DIAGNOSIS — O99340 Other mental disorders complicating pregnancy, unspecified trimester: Secondary | ICD-10-CM

## 2013-08-11 DIAGNOSIS — Z1389 Encounter for screening for other disorder: Secondary | ICD-10-CM

## 2013-08-11 DIAGNOSIS — Z331 Pregnant state, incidental: Secondary | ICD-10-CM

## 2013-08-11 LAB — POCT URINALYSIS DIPSTICK
Blood, UA: NEGATIVE
Glucose, UA: NEGATIVE
Ketones, UA: NEGATIVE
Leukocytes, UA: NEGATIVE
Nitrite, UA: NEGATIVE
Protein, UA: NEGATIVE

## 2013-08-11 MED ORDER — NICOTINE 21 MG/24HR TD PT24: 21.0000 mg | MEDICATED_PATCH | Freq: Every day | TRANSDERMAL | Status: DC

## 2013-08-11 MED ORDER — ALPRAZOLAM 0.25 MG PO TABS: 0.2500 mg | ORAL_TABLET | Freq: Every evening | ORAL | Status: DC | PRN

## 2013-08-11 MED ORDER — BUTALBITAL-APAP-CAFFEINE 50-325-40 MG PO TABS: 1.0000 | ORAL_TABLET | Freq: Four times a day (QID) | ORAL | Status: DC | PRN

## 2013-08-11 MED ORDER — ALBUTEROL SULFATE HFA 108 (90 BASE) MCG/ACT IN AERS: 2.0000 | INHALATION_SPRAY | Freq: Four times a day (QID) | RESPIRATORY_TRACT | Status: DC | PRN

## 2013-08-11 MED ORDER — ALPRAZOLAM 0.25 MG PO TABS: 0.2500 mg | ORAL_TABLET | Freq: Two times a day (BID) | ORAL | Status: DC | PRN

## 2013-08-11 NOTE — Progress Notes (Signed)
Eating and nausea is better. Using electric cigs without nicotine; wants patch.  21mg  patch sent to pharmacy.  Will wean down.  C/O frontal HA's.  Tylenol helps some, but not enough.  Fioricet rx given.  Not being very successful coming off of xanax. Will rx 0.25 BID.  Pt and husband aware of risks of continued xanax use in pregnancy

## 2013-08-11 NOTE — Telephone Encounter (Signed)
Pt asking how far along she is? Informed 14 weeks today.

## 2013-09-01 ENCOUNTER — Ambulatory Visit (INDEPENDENT_AMBULATORY_CARE_PROVIDER_SITE_OTHER): Payer: Medicaid Other | Admitting: Advanced Practice Midwife

## 2013-09-01 ENCOUNTER — Other Ambulatory Visit: Payer: Self-pay | Admitting: Advanced Practice Midwife

## 2013-09-01 ENCOUNTER — Encounter: Payer: Self-pay | Admitting: Advanced Practice Midwife

## 2013-09-01 VITALS — BP 132/56 | Wt 146.0 lb

## 2013-09-01 DIAGNOSIS — Z331 Pregnant state, incidental: Secondary | ICD-10-CM

## 2013-09-01 DIAGNOSIS — O09299 Supervision of pregnancy with other poor reproductive or obstetric history, unspecified trimester: Secondary | ICD-10-CM

## 2013-09-01 DIAGNOSIS — O99340 Other mental disorders complicating pregnancy, unspecified trimester: Secondary | ICD-10-CM

## 2013-09-01 DIAGNOSIS — O360990 Maternal care for other rhesus isoimmunization, unspecified trimester, not applicable or unspecified: Secondary | ICD-10-CM

## 2013-09-01 DIAGNOSIS — O2390 Unspecified genitourinary tract infection in pregnancy, unspecified trimester: Secondary | ICD-10-CM

## 2013-09-01 DIAGNOSIS — Z1389 Encounter for screening for other disorder: Secondary | ICD-10-CM

## 2013-09-01 DIAGNOSIS — Z3480 Encounter for supervision of other normal pregnancy, unspecified trimester: Secondary | ICD-10-CM

## 2013-09-01 LAB — POCT URINALYSIS DIPSTICK
Blood, UA: NEGATIVE
Glucose, UA: NEGATIVE
Ketones, UA: NEGATIVE
Leukocytes, UA: NEGATIVE
Nitrite, UA: NEGATIVE

## 2013-09-01 NOTE — Progress Notes (Signed)
C/O green mucousy vaginal discharge. SSE:   Normal appearing d/c; wet prep negataive.  CX firm, long and closed.    C/O heart palpitations X 1.  Discussed vagal maneuvers prn and possible holter monitor if episodes increase in frequency/severity.  Has not had to take Fioricet for HA.  Feels like anxiety is under control.  Meagan [name] 4D u/s says girl!  F/U 3 weeks for anatomy scan

## 2013-09-05 LAB — MATERNAL SCREEN, INTEGRATED #2
AFP MoM: 0.75
AFP, Serum: 30.9 ng/mL
Age risk Down Syndrome: 1:1100 {titer}
Calculated Gestational Age: 17.6
Crown Rump Length: 65.7 mm
Estriol Mom: 0.89
Estriol, Free: 1.09 ng/mL
Inhibin A Dimeric: 392 pg/mL
Inhibin A MoM: 2.34
MSS Down Syndrome: 1:2000 {titer}
MSS Trisomy 18 Risk: <1:5000 {titer}
NT MoM: 0.96
Nuchal Translucency: 1.42 mm
Number of fetuses: 1
PAPP-A MoM: 0.35
PAPP-A: 351 ng/mL
Rish for ONTD: <1:5000 {titer}
hCG MoM: 0.85
hCG, Serum: 22.6 IU/mL

## 2013-09-10 ENCOUNTER — Encounter: Payer: Self-pay | Admitting: Advanced Practice Midwife

## 2013-09-11 ENCOUNTER — Telehealth: Payer: Self-pay

## 2013-09-14 ENCOUNTER — Telehealth: Payer: Self-pay | Admitting: Obstetrics & Gynecology

## 2013-09-14 NOTE — Telephone Encounter (Signed)
Pt called back today and message routed to [name]. Pt requesting refill on Xanax.

## 2013-09-14 NOTE — Telephone Encounter (Signed)
Pt states needing refill for xanax, states pt puppy got med bottle and ate remaining pills. Can pt get a refill on her xanax?

## 2013-09-15 MED ORDER — ALPRAZOLAM 0.25 MG PO TABS: 0.2500 mg | ORAL_TABLET | Freq: Every evening | ORAL | Status: DC | PRN

## 2013-09-15 NOTE — Telephone Encounter (Signed)
Due for refill anyway.  Xanax 0.25mg  BID #[id number] [facility]

## 2013-09-15 NOTE — Telephone Encounter (Signed)
Pt informed prescription for xanax refilled and [facility].

## 2013-09-21 ENCOUNTER — Telehealth: Payer: Self-pay | Admitting: Obstetrics and Gynecology

## 2013-09-21 NOTE — Telephone Encounter (Signed)
Pt states checked her blood sugar with her friends glucometer and was 233, no symptoms or complaints. Pt states has been drinking Mountain dew. Explained to pt could be elevated due to the fact she had just drunk Mountain dew, Glucose testing done at around 28 weeks will check for gestational diabetes at that time. Pt verbalized understanding.

## 2013-09-22 ENCOUNTER — Encounter: Payer: Self-pay | Admitting: Obstetrics & Gynecology

## 2013-09-22 ENCOUNTER — Other Ambulatory Visit: Payer: Self-pay | Admitting: Advanced Practice Midwife

## 2013-09-22 ENCOUNTER — Ambulatory Visit (INDEPENDENT_AMBULATORY_CARE_PROVIDER_SITE_OTHER): Payer: Medicaid Other

## 2013-09-22 ENCOUNTER — Ambulatory Visit (INDEPENDENT_AMBULATORY_CARE_PROVIDER_SITE_OTHER): Payer: Medicaid Other | Admitting: Obstetrics & Gynecology

## 2013-09-22 VITALS — BP 90/60 | Wt 146.0 lb

## 2013-09-22 DIAGNOSIS — O360990 Maternal care for other rhesus isoimmunization, unspecified trimester, not applicable or unspecified: Secondary | ICD-10-CM

## 2013-09-22 DIAGNOSIS — Z3480 Encounter for supervision of other normal pregnancy, unspecified trimester: Secondary | ICD-10-CM

## 2013-09-22 DIAGNOSIS — O99340 Other mental disorders complicating pregnancy, unspecified trimester: Secondary | ICD-10-CM

## 2013-09-22 DIAGNOSIS — Z331 Pregnant state, incidental: Secondary | ICD-10-CM

## 2013-09-22 DIAGNOSIS — O09299 Supervision of pregnancy with other poor reproductive or obstetric history, unspecified trimester: Secondary | ICD-10-CM

## 2013-09-22 DIAGNOSIS — Z1389 Encounter for screening for other disorder: Secondary | ICD-10-CM

## 2013-09-22 NOTE — Progress Notes (Signed)
U/S(20+0wks)-active fetus, meas c/w dates, fluid wnl, posterior Gr 0 placenta, cx appears closed (5.0cm), bilateral adnexa WNL, no major abnl noted, female fetus, FHR- 159BPM

## 2013-09-22 NOTE — Addendum Note (Signed)
Addended by: Richardson ChiquitoRAVIS, ASHLEY M on: 09/22/2013 03:58 PM   Modules accepted: Orders

## 2013-09-22 NOTE — Progress Notes (Signed)
BP weight and urine results all reviewed and noted. Patient reports good fetal movement, denies any bleeding and no rupture of membranes symptoms or regular contractions. Patient is without complaints. All questions were answered.  

## 2013-09-23 ENCOUNTER — Ambulatory Visit (INDEPENDENT_AMBULATORY_CARE_PROVIDER_SITE_OTHER): Payer: Medicaid Other | Admitting: Women's Health

## 2013-09-23 ENCOUNTER — Encounter: Payer: Self-pay | Admitting: Women's Health

## 2013-09-23 VITALS — BP 120/76 | Wt 145.0 lb

## 2013-09-23 DIAGNOSIS — Z1389 Encounter for screening for other disorder: Secondary | ICD-10-CM

## 2013-09-23 DIAGNOSIS — O360990 Maternal care for other rhesus isoimmunization, unspecified trimester, not applicable or unspecified: Secondary | ICD-10-CM

## 2013-09-23 DIAGNOSIS — Z3480 Encounter for supervision of other normal pregnancy, unspecified trimester: Secondary | ICD-10-CM

## 2013-09-23 DIAGNOSIS — R42 Dizziness and giddiness: Secondary | ICD-10-CM

## 2013-09-23 DIAGNOSIS — Z331 Pregnant state, incidental: Secondary | ICD-10-CM

## 2013-09-23 DIAGNOSIS — O99340 Other mental disorders complicating pregnancy, unspecified trimester: Secondary | ICD-10-CM

## 2013-09-23 DIAGNOSIS — Z131 Encounter for screening for diabetes mellitus: Secondary | ICD-10-CM

## 2013-09-23 LAB — POCT URINALYSIS DIPSTICK
Blood, UA: NEGATIVE
Glucose, UA: NEGATIVE
Glucose, UA: NEGATIVE
Ketones, UA: NEGATIVE
Leukocytes, UA: NEGATIVE
Nitrite, UA: NEGATIVE
Protein, UA: NEGATIVE
Protein, UA: NEGATIVE

## 2013-09-23 LAB — GLUCOSE, POCT (MANUAL RESULT ENTRY): POC Glucose: 85 mg/dl (ref 70–99)

## 2013-09-23 NOTE — Progress Notes (Signed)
Work-in: dizziness when lying/standing. No earaches. Not eating a lot. CBG 85. BP was low yesterday, dizziness possibly r/t physiologic hypotension of 2nd trimester, could also be hypoglycemia events. To increase fluids, eat 183meals/d w/ snacks in between- if unable to eat adequately can supplement w/ boost or ensure. Has been lying flat on back- to lay on sides or inclined. +FM, denies uc's, vb, lof. Keep appt on 4/21 as scheduled, or call before if dizziness not improving.

## 2013-09-23 NOTE — Progress Notes (Signed)
Pt states that she has been very dizzy today.

## 2013-09-23 NOTE — Patient Instructions (Signed)

## 2013-09-29 IMAGING — US US RENAL
1 series · 14 of 18 positions shown · non-contrast
Comparison: 02/21/2011 abdominal CT.

CLINICAL DATA: Abdominal pain.  Left renal stone.  Pregnant
patient.

RENAL/URINARY TRACT ULTRASOUND COMPLETE

[Series 1: us renal · 0.25mm/px · 14 of 18 slices shown]
[im 1/18]
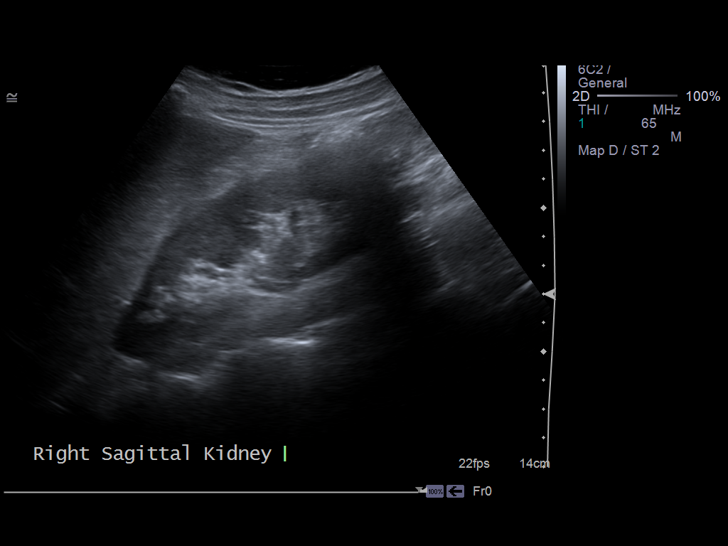
[im 2/18]
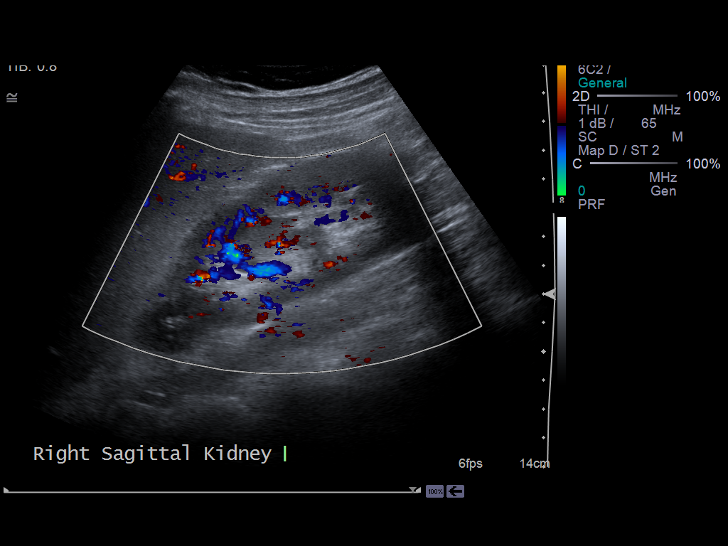
[im 4/18]
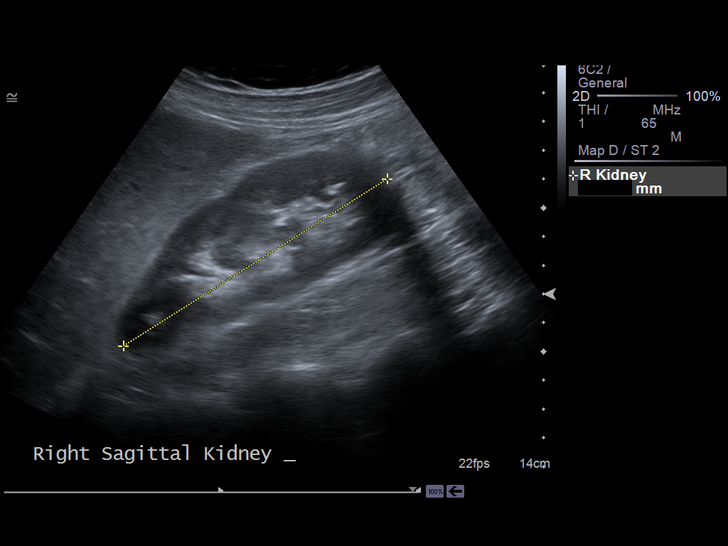
[im 5/18]
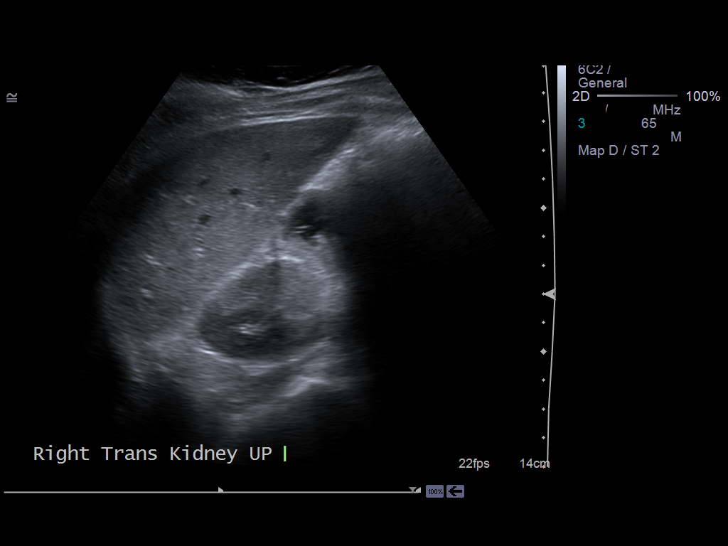
[im 6/18]
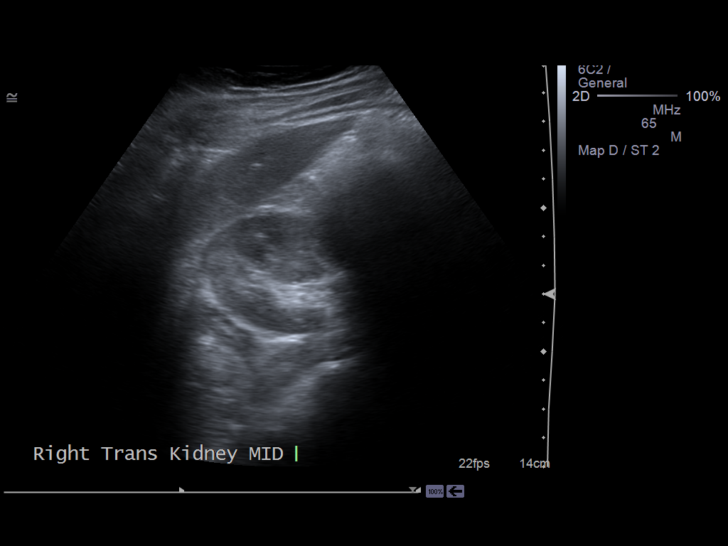
[im 8/18]
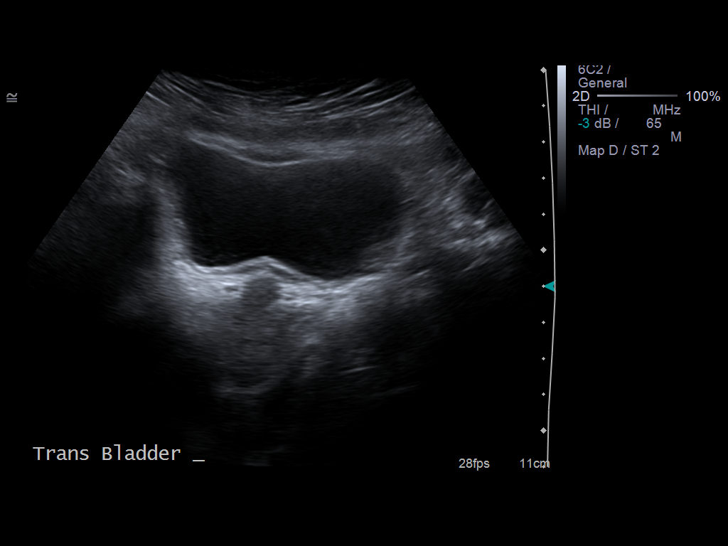
[im 9/18]
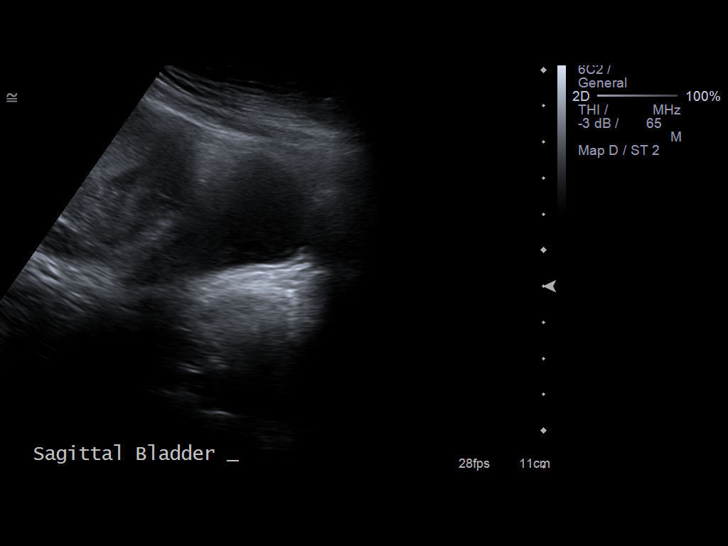
[im 10/18]
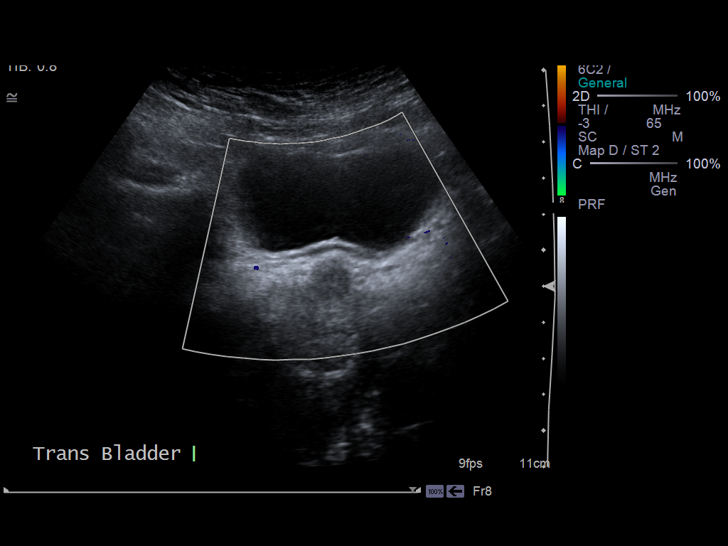
[im 11/18]
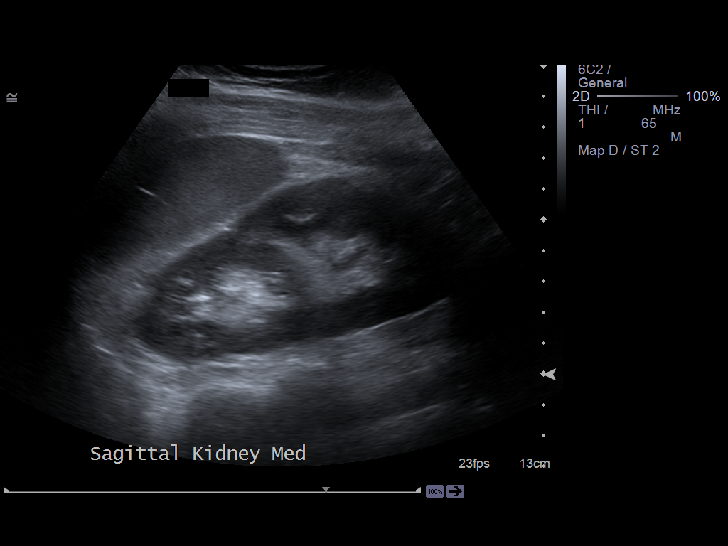
[im 13/18]
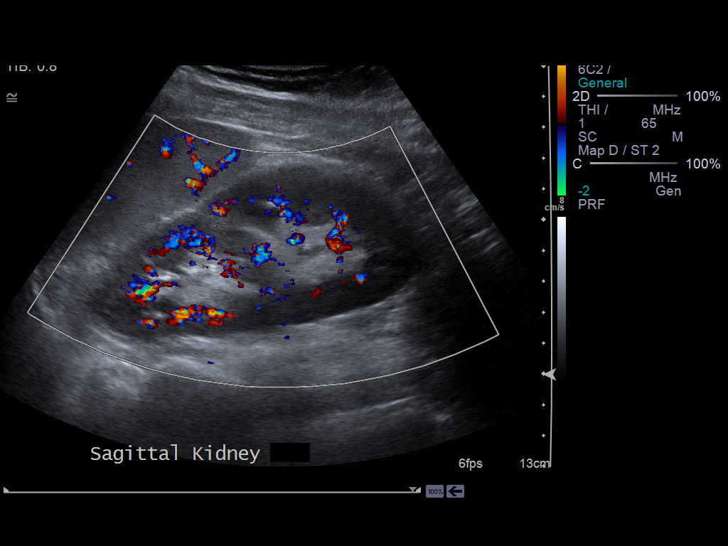
[im 14/18]
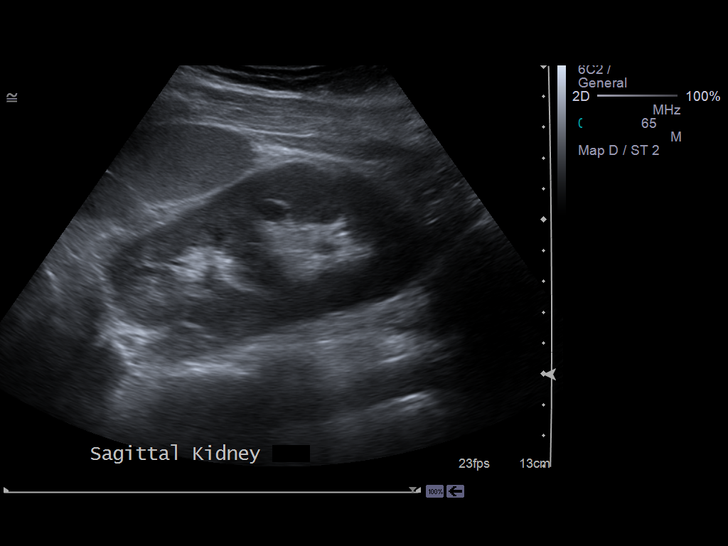
[im 15/18]
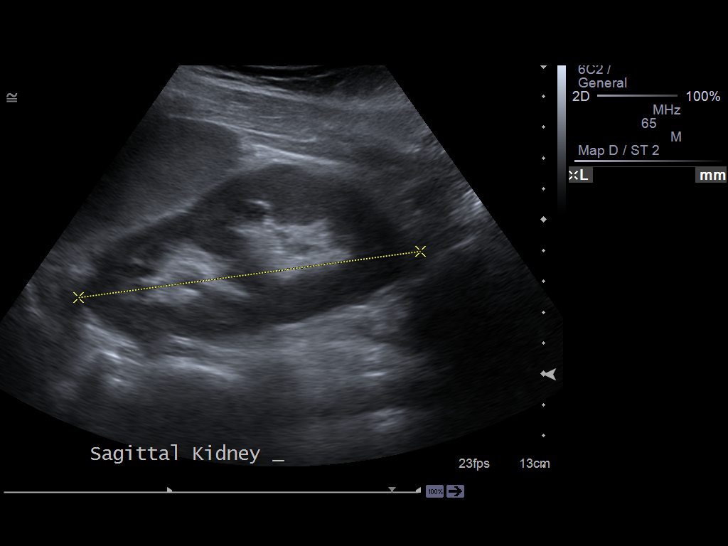
[im 17/18]
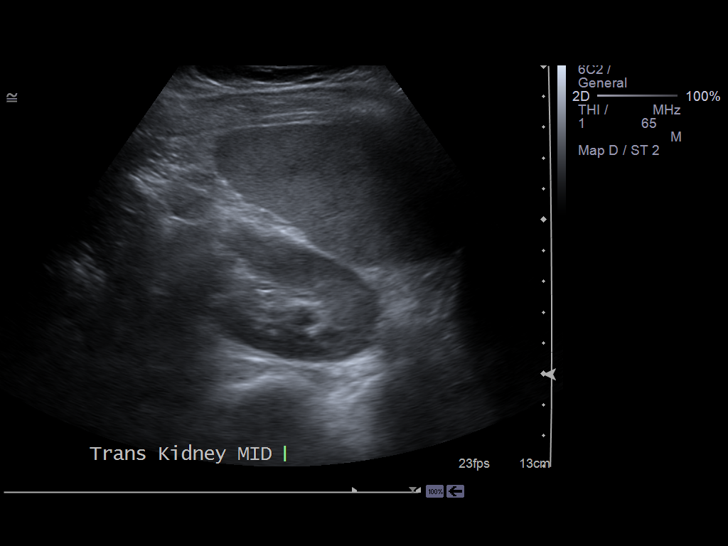
[im 18/18]
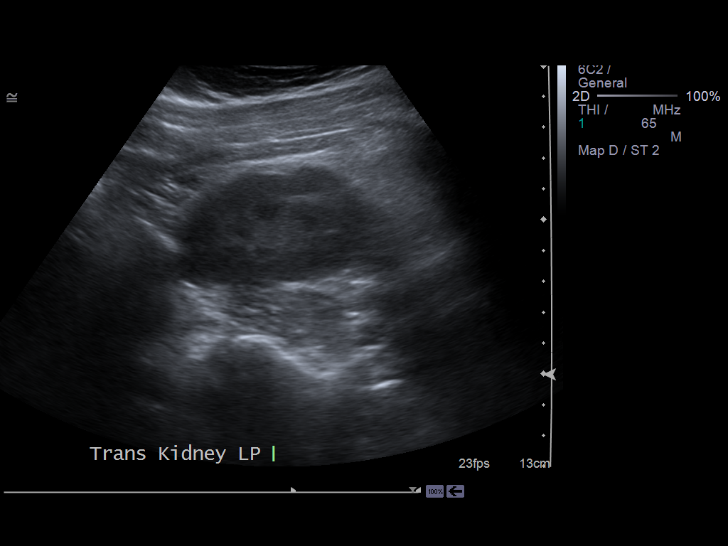

[14 of 18 positions shown; findings below may reference images not displayed]

FINDINGS: Right Kidney:  10.9 cm. Normal echotexture.  Normal central sinus
echo complex.  No calculi or hydronephrosis.

Left Kidney:  11.2 cm. Normal echotexture.  Normal central sinus
echo complex.  No calculi or hydronephrosis.

Bladder:  Normal.
IMPRESSION: Negative renal ultrasound.

## 2013-10-19 ENCOUNTER — Other Ambulatory Visit: Payer: Self-pay | Admitting: *Deleted

## 2013-10-19 ENCOUNTER — Telehealth: Payer: Self-pay | Admitting: *Deleted

## 2013-10-19 NOTE — Telephone Encounter (Signed)
Pt aware that Rx was sent to the provider for approval.

## 2013-10-20 ENCOUNTER — Ambulatory Visit (INDEPENDENT_AMBULATORY_CARE_PROVIDER_SITE_OTHER): Payer: Medicaid Other | Admitting: Advanced Practice Midwife

## 2013-10-20 ENCOUNTER — Encounter: Payer: Self-pay | Admitting: Advanced Practice Midwife

## 2013-10-20 VITALS — BP 122/60 | Wt 151.0 lb

## 2013-10-20 DIAGNOSIS — O09299 Supervision of pregnancy with other poor reproductive or obstetric history, unspecified trimester: Secondary | ICD-10-CM

## 2013-10-20 DIAGNOSIS — Z3490 Encounter for supervision of normal pregnancy, unspecified, unspecified trimester: Secondary | ICD-10-CM

## 2013-10-20 DIAGNOSIS — O360990 Maternal care for other rhesus isoimmunization, unspecified trimester, not applicable or unspecified: Secondary | ICD-10-CM

## 2013-10-20 DIAGNOSIS — O99340 Other mental disorders complicating pregnancy, unspecified trimester: Secondary | ICD-10-CM

## 2013-10-20 DIAGNOSIS — Z1389 Encounter for screening for other disorder: Secondary | ICD-10-CM

## 2013-10-20 DIAGNOSIS — F419 Anxiety disorder, unspecified: Secondary | ICD-10-CM

## 2013-10-20 DIAGNOSIS — Z331 Pregnant state, incidental: Secondary | ICD-10-CM

## 2013-10-20 LAB — POCT URINALYSIS DIPSTICK
Blood, UA: NEGATIVE
Glucose, UA: NEGATIVE
Ketones, UA: NEGATIVE
Leukocytes, UA: NEGATIVE
Nitrite, UA: NEGATIVE
Protein, UA: NEGATIVE

## 2013-10-20 MED ORDER — ALPRAZOLAM 0.25 MG PO TABS: ORAL_TABLET | ORAL | Status: DC

## 2013-10-20 NOTE — Progress Notes (Signed)
C/O lower [place] pain [place].  Gets cramps off and on for 2.5 weeks.  Encouraged to stay hydrated.  F/U 4 weeks for Pn2/Low-risk ob appt

## 2013-10-20 NOTE — Patient Instructions (Signed)
1. Before your test, do not eat or drink anything for 8-10 hours prior to your  appointment (a small amount of water is allowed and you may take any medicines you normally take). 2. When you arrive, your blood will be drawn for a 'fasting' blood sugar level.  Then you will be given a sweetened carbonated beverage to drink. You should  complete drinking this beverage within five minutes. After finishing the  beverage, you will have your blood drawn exactly 1 and 2 hours later. Having  your blood drawn on time is an important part of this test. A total of three blood  samples will be done. 3. The test takes approximately 2  hours. During the test, do not have anything to  eat or drink. Do not smoke, chew gum (not even sugarless gum) or use breath mints.  4. During the test you should remain close by and seated as much as possible and  avoid walking around. You may want to bring a book or something else to  occupy your time.  5. After your test, you may eat and drink as normal. You may want to bring a snack  to eat after the test is finished. Your provider will advise you as to the results of  this test and any follow-up if necessary  You will also be retested for syphilis, HIV and blood levels (anemia):  You were already tested in the first trimester, but [place] recommends retesting.  Additionally, you will be tested for Type 2 Herpes. MOST people do not know that they have genital herpes, as only around 15% of people have outbreaks.  However, it is still transmittable to other people, including the baby (but only during the birth).  If you test positive for Type 2 Herpes, we place you on a medicine called acyclovir the last 6 weeks of your pregnancy to prevent transmission of the virus to the baby during the birth.    If your sugar test is positive for gestational diabetes, you will be given an phone call and further instructions discussed.  We typically do not call patients with positive  herpes results, but will discuss it at your next appointment.  If you wish to know all of your test results before your next appointment, feel free to call the office, or look up your test results on Mychart.  (The range that the lab uses for normal values of the sugar test are not necessarily the range that is used for pregnant women; if your results are within the range, they are definitely normal.  However, if a value is deemed "high" by the lab, it may not be too high for a pregnant woman.  We will need to discuss the normal range if your value(s) fall in the "high" category).     

## 2013-11-10 ENCOUNTER — Ambulatory Visit (INDEPENDENT_AMBULATORY_CARE_PROVIDER_SITE_OTHER): Payer: Medicaid Other | Admitting: Women's Health

## 2013-11-10 ENCOUNTER — Encounter: Payer: Self-pay | Admitting: Women's Health

## 2013-11-10 ENCOUNTER — Other Ambulatory Visit: Payer: Medicaid Other

## 2013-11-10 VITALS — BP 120/52 | Wt 158.0 lb

## 2013-11-10 DIAGNOSIS — Z331 Pregnant state, incidental: Secondary | ICD-10-CM

## 2013-11-10 DIAGNOSIS — Z3480 Encounter for supervision of other normal pregnancy, unspecified trimester: Secondary | ICD-10-CM

## 2013-11-10 DIAGNOSIS — Z1389 Encounter for screening for other disorder: Secondary | ICD-10-CM

## 2013-11-10 LAB — CBC
HCT: 34.7 % — ABNORMAL LOW (ref 36.0–46.0)
Hemoglobin: 11.7 g/dL — ABNORMAL LOW (ref 12.0–15.0)
MCH: 31.2 pg (ref 26.0–34.0)
MCHC: 33.7 g/dL (ref 30.0–36.0)
MCV: 92.5 fL (ref 78.0–100.0)
Platelets: 168 10*3/uL (ref 150–400)
RBC: 3.75 MIL/uL — ABNORMAL LOW (ref 3.87–5.11)
RDW: 14.3 % (ref 11.5–15.5)
WBC: 9.8 10*3/uL (ref 4.0–10.5)

## 2013-11-10 LAB — POCT URINALYSIS DIPSTICK
Blood, UA: NEGATIVE
Glucose, UA: NEGATIVE
Ketones, UA: NEGATIVE
Leukocytes, UA: NEGATIVE
Nitrite, UA: NEGATIVE
Protein, UA: NEGATIVE

## 2013-11-10 NOTE — Progress Notes (Signed)
Reports good fm. Denies uc's, lof, vb, uti s/s.  No complaints.  Weaning off of xanax, down to one 0.25mg /day.  Reviewed ptl s/s, fkc.  All questions answered. F/U in 3wks for visit.  PN2 today.

## 2013-11-10 NOTE — Patient Instructions (Signed)
Third Trimester of Pregnancy  The third trimester is from week 29 through week 42, months 7 through 9. The third trimester is a time when the fetus is growing rapidly. At the end of the ninth month, the fetus is about 20 inches in length and weighs 6 10 pounds.   BODY CHANGES  Your body goes through many changes during pregnancy. The changes vary from woman to woman.    Your weight will continue to increase. You can expect to gain 25 35 pounds (11 16 kg) by the end of the pregnancy.   You may begin to get stretch marks on your hips, abdomen, and breasts.   You may urinate more often because the fetus is moving lower into your pelvis and pressing on your bladder.   You may develop or continue to have heartburn as a result of your pregnancy.   You may develop constipation because certain hormones are causing the muscles that push waste through your intestines to slow down.   You may develop hemorrhoids or swollen, bulging veins (varicose veins).   You may have pelvic pain because of the weight gain and pregnancy hormones relaxing your joints between the bones in your pelvis. Back aches may result from over exertion of the muscles supporting your posture.   Your breasts will continue to grow and be tender. A yellow discharge may leak from your breasts called colostrum.   Your belly button may stick out.   You may feel short of breath because of your expanding uterus.   You may notice the fetus "dropping," or moving lower in your abdomen.   You may have a bloody mucus discharge. This usually occurs a few days to a week before labor begins.   Your cervix becomes thin and soft (effaced) near your due date.  WHAT TO EXPECT AT YOUR PRENATAL EXAMS   You will have prenatal exams every 2 weeks until week 36. Then, you will have weekly prenatal exams. During a routine prenatal visit:   You will be weighed to make sure you and the fetus are growing normally.   Your blood pressure is taken.   Your abdomen will be  measured to track your baby's growth.   The fetal heartbeat will be listened to.   Any test results from the previous visit will be discussed.   You may have a cervical check near your due date to see if you have effaced.  At around 36 weeks, your caregiver will check your cervix. At the same time, your caregiver will also perform a test on the secretions of the vaginal tissue. This test is to determine if a type of bacteria, Group B streptococcus, is present. Your caregiver will explain this further.  Your caregiver may ask you:   What your birth plan is.   How you are feeling.   If you are feeling the baby move.   If you have had any abnormal symptoms, such as leaking fluid, bleeding, severe headaches, or abdominal cramping.   If you have any questions.  Other tests or screenings that may be performed during your third trimester include:   Blood tests that check for low iron levels (anemia).   Fetal testing to check the health, activity level, and growth of the fetus. Testing is done if you have certain medical conditions or if there are problems during the pregnancy.  FALSE LABOR  You may feel small, irregular contractions that eventually go away. These are called Braxton Hicks contractions, or   false labor. Contractions may last for hours, days, or even weeks before true labor sets in. If contractions come at regular intervals, intensify, or become painful, it is best to be seen by your caregiver.   SIGNS OF LABOR    Menstrual-like cramps.   Contractions that are 5 minutes apart or less.   Contractions that start on the top of the uterus and spread down to the lower abdomen and back.   A sense of increased pelvic pressure or back pain.   A watery or bloody mucus discharge that comes from the vagina.  If you have any of these signs before the 37th week of pregnancy, call your caregiver right away. You need to go to the hospital to get checked immediately.  HOME CARE INSTRUCTIONS    Avoid all  smoking, herbs, alcohol, and unprescribed drugs. These chemicals affect the formation and growth of the baby.   Follow your caregiver's instructions regarding medicine use. There are medicines that are either safe or unsafe to take during pregnancy.   Exercise only as directed by your caregiver. Experiencing uterine cramps is a good sign to stop exercising.   Continue to eat regular, healthy meals.   Wear a good support bra for breast tenderness.   Do not use hot tubs, steam rooms, or saunas.   Wear your seat belt at all times when driving.   Avoid raw meat, uncooked cheese, cat litter boxes, and soil used by cats. These carry germs that can cause birth defects in the baby.   Take your prenatal vitamins.   Try taking a stool softener (if your caregiver approves) if you develop constipation. Eat more high-fiber foods, such as fresh vegetables or fruit and whole grains. Drink plenty of fluids to keep your urine clear or pale yellow.   Take warm sitz baths to soothe any pain or discomfort caused by hemorrhoids. Use hemorrhoid cream if your caregiver approves.   If you develop varicose veins, wear support hose. Elevate your feet for 15 minutes, 3 4 times a day. Limit salt in your diet.   Avoid heavy lifting, wear low heal shoes, and practice good posture.   Rest a lot with your legs elevated if you have leg cramps or low back pain.   Visit your dentist if you have not gone during your pregnancy. Use a soft toothbrush to brush your teeth and be gentle when you floss.   A sexual relationship may be continued unless your caregiver directs you otherwise.   Do not travel far distances unless it is absolutely necessary and only with the approval of your caregiver.   Take prenatal classes to understand, practice, and ask questions about the labor and delivery.   Make a trial run to the hospital.   Pack your hospital bag.   Prepare the baby's nursery.   Continue to go to all your prenatal visits as directed  by your caregiver.  SEEK MEDICAL CARE IF:   You are unsure if you are in labor or if your water has broken.   You have dizziness.   You have mild pelvic cramps, pelvic pressure, or nagging pain in your abdominal area.   You have persistent nausea, vomiting, or diarrhea.   You have a bad smelling vaginal discharge.   You have pain with urination.  SEEK IMMEDIATE MEDICAL CARE IF:    You have a fever.   You are leaking fluid from your vagina.   You have spotting or bleeding from your vagina.     You have severe abdominal cramping or pain.   You have rapid weight loss or gain.   You have shortness of breath with chest pain.   You notice sudden or extreme swelling of your face, hands, ankles, feet, or legs.   You have not felt your baby move in over an hour.   You have severe headaches that do not go away with medicine.   You have vision changes.  Document Released: 06/12/2001 Document Revised: 02/18/2013 Document Reviewed: 08/19/2012  [employer] Patient Information 2014 [employer].

## 2013-11-11 ENCOUNTER — Encounter: Payer: Self-pay | Admitting: Women's Health

## 2013-11-11 LAB — HIV ANTIBODY (ROUTINE TESTING W REFLEX): HIV 1&2 Ab, 4th Generation: NONREACTIVE

## 2013-11-11 LAB — HSV 2 ANTIBODY, IGG: HSV 2 Glycoprotein G Ab, IgG: <0.1 IV

## 2013-11-11 LAB — GLUCOSE TOLERANCE, 2 HOURS W/ 1HR
Glucose, 1 hour: 94 mg/dL (ref 70–170)
Glucose, 2 hour: 59 mg/dL — ABNORMAL LOW (ref 70–139)
Glucose, Fasting: 68 mg/dL — ABNORMAL LOW (ref 70–99)

## 2013-11-11 LAB — ANTIBODY SCREEN: Antibody Screen: NEGATIVE

## 2013-11-11 LAB — RPR

## 2013-11-12 ENCOUNTER — Telehealth: Payer: Self-pay | Admitting: *Deleted

## 2013-11-12 NOTE — Telephone Encounter (Signed)
Pt aware of results 

## 2013-11-13 ENCOUNTER — Telehealth: Payer: Self-pay | Admitting: Obstetrics & Gynecology

## 2013-11-13 NOTE — Telephone Encounter (Signed)
Pt states woke up early this morning with vomiting (mucus and blood) a one time occurrence, no other complaints at this time. Pt states does have Zofran. Encouraged pt to take Zofran as needed continue to monitor if reoccurs call our office back. Pt verbalized understanding.

## 2013-12-02 ENCOUNTER — Ambulatory Visit (INDEPENDENT_AMBULATORY_CARE_PROVIDER_SITE_OTHER): Payer: Medicaid Other | Admitting: Advanced Practice Midwife

## 2013-12-02 ENCOUNTER — Encounter: Payer: Self-pay | Admitting: Advanced Practice Midwife

## 2013-12-02 VITALS — BP 130/60 | Wt 161.5 lb

## 2013-12-02 DIAGNOSIS — Z331 Pregnant state, incidental: Secondary | ICD-10-CM

## 2013-12-02 DIAGNOSIS — O360990 Maternal care for other rhesus isoimmunization, unspecified trimester, not applicable or unspecified: Secondary | ICD-10-CM

## 2013-12-02 DIAGNOSIS — O26899 Other specified pregnancy related conditions, unspecified trimester: Secondary | ICD-10-CM

## 2013-12-02 DIAGNOSIS — Z6791 Unspecified blood type, Rh negative: Secondary | ICD-10-CM

## 2013-12-02 DIAGNOSIS — Z1389 Encounter for screening for other disorder: Secondary | ICD-10-CM

## 2013-12-02 LAB — POCT URINALYSIS DIPSTICK
Blood, UA: NEGATIVE
Glucose, UA: NEGATIVE
Ketones, UA: NEGATIVE
Leukocytes, UA: NEGATIVE
Nitrite, UA: NEGATIVE
Protein, UA: NEGATIVE

## 2013-12-02 MED ORDER — RHO D IMMUNE GLOBULIN 1500 UNIT/2ML IJ SOSY
300.0000 ug | PREFILLED_SYRINGE | Freq: Once | INTRAMUSCULAR | Status: AC
  Administered 2013-12-02: 300 ug via INTRAMUSCULAR

## 2013-12-02 NOTE — Progress Notes (Signed)
No c/o at this time except normal pregnancy complaints.  Routine questions about pregnancy answered.  F/U in 2 weeks for Low-risk ob appt .

## 2013-12-16 ENCOUNTER — Ambulatory Visit (INDEPENDENT_AMBULATORY_CARE_PROVIDER_SITE_OTHER): Payer: Medicaid Other | Admitting: Women's Health

## 2013-12-16 ENCOUNTER — Encounter: Payer: Self-pay | Admitting: Women's Health

## 2013-12-16 VITALS — BP 110/60 | Wt 165.0 lb

## 2013-12-16 DIAGNOSIS — Z331 Pregnant state, incidental: Secondary | ICD-10-CM

## 2013-12-16 DIAGNOSIS — Z3480 Encounter for supervision of other normal pregnancy, unspecified trimester: Secondary | ICD-10-CM

## 2013-12-16 DIAGNOSIS — F17200 Nicotine dependence, unspecified, uncomplicated: Secondary | ICD-10-CM | POA: Insufficient documentation

## 2013-12-16 DIAGNOSIS — O368190 Decreased fetal movements, unspecified trimester, not applicable or unspecified: Secondary | ICD-10-CM

## 2013-12-16 DIAGNOSIS — Z1389 Encounter for screening for other disorder: Secondary | ICD-10-CM

## 2013-12-16 LAB — POCT URINALYSIS DIPSTICK
Blood, UA: NEGATIVE
Glucose, UA: NEGATIVE
Ketones, UA: NEGATIVE
Leukocytes, UA: NEGATIVE
Nitrite, UA: NEGATIVE
Protein, UA: NEGATIVE
Urobilinogen, UA: NEGATIVE

## 2013-12-16 NOTE — Patient Instructions (Signed)

## 2013-12-16 NOTE — Progress Notes (Signed)
Low-risk OB appointment G3P1011 2729w1d Estimated Date of Delivery: 02/09/14 Blood pressure 110/60, weight 165 lb (74.844 kg), last menstrual period 04/15/2013, unknown if currently breastfeeding.  BP, weight, and urine results all reviewed and noted.  Please refer to the obstetrical flow sheet for the fundal height and fetal heart rate documentation. Reports decreased fm x 1wk.  Reactive NST. Denies regular uc's, lof, vb, or uti s/s. Some pressure and cramping, requests SVE SVE: LTC Reviewed ptl s/s, fkc. Plan:  Continued routine obstetrical care  F/U in 2wk for OB appointment

## 2013-12-30 ENCOUNTER — Ambulatory Visit (INDEPENDENT_AMBULATORY_CARE_PROVIDER_SITE_OTHER): Payer: Medicaid Other | Admitting: Obstetrics & Gynecology

## 2013-12-30 VITALS — BP 122/58 | Wt 172.0 lb

## 2013-12-30 DIAGNOSIS — Z3483 Encounter for supervision of other normal pregnancy, third trimester: Secondary | ICD-10-CM

## 2013-12-30 DIAGNOSIS — Z1389 Encounter for screening for other disorder: Secondary | ICD-10-CM

## 2013-12-30 DIAGNOSIS — Z3480 Encounter for supervision of other normal pregnancy, unspecified trimester: Secondary | ICD-10-CM

## 2013-12-30 DIAGNOSIS — Z331 Pregnant state, incidental: Secondary | ICD-10-CM

## 2013-12-30 LAB — POCT URINALYSIS DIPSTICK
Blood, UA: NEGATIVE
Glucose, UA: NEGATIVE
Ketones, UA: NEGATIVE
Leukocytes, UA: NEGATIVE
Nitrite, UA: NEGATIVE
Protein, UA: NEGATIVE

## 2013-12-30 NOTE — Progress Notes (Signed)
[id number] 2423w1d Estimated Date of Delivery: 02/09/14  Blood pressure 122/58, weight 172 lb (78.019 kg), last menstrual period 04/15/2013, unknown if currently breastfeeding.   BP weight and urine results all reviewed and noted.  Please refer to the obstetrical flow sheet for the fundal height and fetal heart rate documentation:  Patient reports good fetal movement, denies any bleeding and no rupture of membranes symptoms or regular contractions. Patient is without complaints. All questions were answered.  Plan:  Continued routine obstetrical care,   Follow up in 2 weeks for OB appointment, routine

## 2014-01-12 ENCOUNTER — Ambulatory Visit (INDEPENDENT_AMBULATORY_CARE_PROVIDER_SITE_OTHER): Payer: Medicaid Other | Admitting: Obstetrics & Gynecology

## 2014-01-12 VITALS — BP 110/60 | Temp 98.2°F | Wt 172.0 lb

## 2014-01-12 DIAGNOSIS — J069 Acute upper respiratory infection, unspecified: Secondary | ICD-10-CM

## 2014-01-12 DIAGNOSIS — Z1389 Encounter for screening for other disorder: Secondary | ICD-10-CM

## 2014-01-12 DIAGNOSIS — Z331 Pregnant state, incidental: Secondary | ICD-10-CM

## 2014-01-12 LAB — POCT URINALYSIS DIPSTICK
Glucose, UA: NEGATIVE
Ketones, UA: NEGATIVE
Nitrite, UA: NEGATIVE
Protein, UA: NEGATIVE

## 2014-01-12 NOTE — Progress Notes (Signed)
URI symptoms starting this morning; son also with symptoms. Nasal congestion (yellow mucous), cough, sore throat, ear itchiness with some discharge.  Exam: HEENT: PERRL, EOMI. Nares patent, no notable discharge. TMs pearly gray bilaterally without bulging or erythema, mild erythema right ear canal.  CV: RRR, normal heart sounds, no murmur appreciated Resp: Clear bilaterally, no increased WOB  A/P: Suspect viral URI.

## 2014-01-13 ENCOUNTER — Encounter: Payer: Medicaid Other | Admitting: Advanced Practice Midwife

## 2014-01-14 ENCOUNTER — Telehealth: Payer: Self-pay | Admitting: Obstetrics and Gynecology

## 2014-01-14 NOTE — Telephone Encounter (Signed)
Pt states saw Dr. Despina HiddenEure on 01/12/2014 for cold, cough symptoms, pt states no improvement continues to have brownish, green mucus with cough, no fever, nasal congestion,sharp pain in between shoulder blades in back. Please advise.

## 2014-01-15 MED ORDER — PREDNISONE 10 MG PO TABS: ORAL_TABLET | ORAL | Status: DC

## 2014-01-15 MED ORDER — AZITHROMYCIN 250 MG PO TABS: ORAL_TABLET | ORAL | Status: DC

## 2014-01-15 NOTE — Telephone Encounter (Signed)
Pt informed prednisone and azithromycin e-scribed to [facility].

## 2014-01-20 ENCOUNTER — Encounter: Payer: Medicaid Other | Admitting: Advanced Practice Midwife

## 2014-01-20 ENCOUNTER — Encounter: Payer: Self-pay | Admitting: Advanced Practice Midwife

## 2014-01-20 ENCOUNTER — Ambulatory Visit (INDEPENDENT_AMBULATORY_CARE_PROVIDER_SITE_OTHER): Payer: Medicaid Other | Admitting: Advanced Practice Midwife

## 2014-01-20 VITALS — BP 110/72 | Wt 173.8 lb

## 2014-01-20 DIAGNOSIS — Z3483 Encounter for supervision of other normal pregnancy, third trimester: Secondary | ICD-10-CM

## 2014-01-20 DIAGNOSIS — Z331 Pregnant state, incidental: Secondary | ICD-10-CM

## 2014-01-20 DIAGNOSIS — Z1389 Encounter for screening for other disorder: Secondary | ICD-10-CM

## 2014-01-20 DIAGNOSIS — Z3480 Encounter for supervision of other normal pregnancy, unspecified trimester: Secondary | ICD-10-CM

## 2014-01-20 LAB — POCT URINALYSIS DIPSTICK
Glucose, UA: NEGATIVE
Ketones, UA: NEGATIVE
Leukocytes, UA: NEGATIVE
Nitrite, UA: NEGATIVE
Protein, UA: NEGATIVE

## 2014-01-20 NOTE — Progress Notes (Signed)
[id number] 5978w1d Estimated Date of Delivery: 02/09/14  Blood pressure 110/72, weight 173 lb 12.8 oz (78.835 kg), last menstrual period 04/15/2013, unknown if currently breastfeeding.   BP weight and urine results all reviewed and noted.  Please refer to the obstetrical flow sheet for the fundal height and fetal heart rate documentation:         Patient reports good fetal movement, denies any bleeding or regular contractions.  Had a lot of "fluid" come out when sneezed yesterday.  No leaking since.   SSE:  No pooling, negative fern and valsalva. GBS collected  All questions were answered.  Plan:  Continued routine obstetrical care,   Follow up in 1 weeks for OB appointment,

## 2014-01-21 LAB — GC/CHLAMYDIA PROBE AMP
CT Probe RNA: NEGATIVE
GC Probe RNA: NEGATIVE

## 2014-01-22 LAB — CULTURE, STREPTOCOCCUS GRP B W/SUSCEPT

## 2014-01-26 LAB — OB RESULTS CONSOLE GBS: GBS: NEGATIVE

## 2014-01-27 ENCOUNTER — Encounter: Payer: Self-pay | Admitting: Advanced Practice Midwife

## 2014-01-27 ENCOUNTER — Ambulatory Visit (INDEPENDENT_AMBULATORY_CARE_PROVIDER_SITE_OTHER): Payer: Medicaid Other | Admitting: Advanced Practice Midwife

## 2014-01-27 VITALS — BP 118/86 | Temp 97.8°F | Wt 174.0 lb

## 2014-01-27 DIAGNOSIS — Z130 Encounter for screening for diseases of the blood and blood-forming organs and certain disorders involving the immune mechanism: Secondary | ICD-10-CM

## 2014-01-27 DIAGNOSIS — Z1389 Encounter for screening for other disorder: Secondary | ICD-10-CM

## 2014-01-27 DIAGNOSIS — Z3480 Encounter for supervision of other normal pregnancy, unspecified trimester: Secondary | ICD-10-CM

## 2014-01-27 DIAGNOSIS — Z331 Pregnant state, incidental: Secondary | ICD-10-CM

## 2014-01-27 DIAGNOSIS — R42 Dizziness and giddiness: Secondary | ICD-10-CM

## 2014-01-27 LAB — POCT URINALYSIS DIPSTICK
Blood, UA: NEGATIVE
Glucose, UA: NEGATIVE
Ketones, UA: NEGATIVE
Leukocytes, UA: NEGATIVE
Nitrite, UA: NEGATIVE
Protein, UA: NEGATIVE

## 2014-01-27 LAB — GLUCOSE, POCT (MANUAL RESULT ENTRY): POC Glucose: 100 mg/dl — AB (ref 70–99)

## 2014-01-27 NOTE — Progress Notes (Signed)
[id number] 713w1d Estimated Date of Delivery: 02/09/14  Blood pressure 118/86, temperature 97.8 F (36.6 C), weight 174 lb (78.926 kg), last menstrual period 04/15/2013, unknown if currently breastfeeding.   BP weight and urine results all reviewed and noted.  Please refer to the obstetrical flow sheet for the fundal height and fetal heart rate documentation:  Patient reports good fetal movement, denies any bleeding and no rupture of membranes symptoms or regular contractions. Patient has had diarrhea, nausea and contractions today.  Had some shakiness, relieved with food.  All questions were answered.  Plan:  Continued routine obstetrical care,   Follow up in 1 weeks for OB appointment,

## 2014-01-27 NOTE — Addendum Note (Signed)
Addended by: Criss AlvinePULLIAM, Darcie Mellone G on: 01/27/2014 04:08 PM   Modules accepted: Orders

## 2014-01-28 ENCOUNTER — Inpatient Hospital Stay (HOSPITAL_COMMUNITY)
Admission: AD | Admit: 2014-01-28 | Discharge: 2014-01-28 | Disposition: A | Discharge status: Home / Self Care | Payer: Medicaid Other | Source: Ambulatory Visit | Attending: Obstetrics and Gynecology | Admitting: Obstetrics and Gynecology

## 2014-01-28 ENCOUNTER — Telehealth: Payer: Self-pay | Admitting: Advanced Practice Midwife

## 2014-01-28 ENCOUNTER — Encounter (HOSPITAL_COMMUNITY): Payer: Self-pay | Admitting: *Deleted

## 2014-01-28 DIAGNOSIS — O99330 Smoking (tobacco) complicating pregnancy, unspecified trimester: Secondary | ICD-10-CM

## 2014-01-28 DIAGNOSIS — O479 False labor, unspecified: Secondary | ICD-10-CM | POA: Diagnosis present

## 2014-01-28 DIAGNOSIS — Z3483 Encounter for supervision of other normal pregnancy, third trimester: Secondary | ICD-10-CM

## 2014-01-28 MED ORDER — ONDANSETRON HCL 4 MG PO TABS: 4.0000 mg | ORAL_TABLET | Freq: Three times a day (TID) | ORAL | Status: DC | PRN

## 2014-01-28 NOTE — Telephone Encounter (Signed)
Pt states that she needs to speak to Meagan [name]. Pt states that she woke up this morning having contractions, they lasted for about 2 hours and were 5-7 minutes apart. Pt states that she is having a lot of pressure. Pt denies any gush of fluid or bleeding. Pt states that since yesterday she has been nauseated for the past few days. Pt just wants to know if there is anything she can do to help things along.

## 2014-01-28 NOTE — MAU Note (Signed)
C/o ucs since "night before last" and got stronger last night; c/o thick white discharge for past 2 weeks;

## 2014-01-28 NOTE — MAU Provider Note (Signed)
Attestation of Attending Supervision of Advanced Practitioner (CNM/NP): Evaluation and management procedures were performed by the Advanced Practitioner under my supervision and collaboration.  I have reviewed the Advanced Practitioner's note and chart, and I agree with the management and plan.  Bunnie Lederman 01/28/2014 8:38 PM   

## 2014-01-28 NOTE — MAU Provider Note (Signed)
None     Chief Complaint:  Contractions   Meagan [name] is  23 y.o. G3P1011 at 6416w2d presents complaining of Contractions .  She states irregular contractions since last night which are associated with no vaginal bleeding, intact membranes, along with active fetal movement. Also states she has had a thick white discharge w/o odor, irritation, or itching for 2 weeks She desperately wants to induce labor due to being "tired of being pregnany"  Obstetrical/Gynecological History: OB History   Grav Para Term Preterm Abortions TAB SAB Ect Mult Living   3 1 1  0 1 0 1 0 0 1     Past Medical History: Past Medical History  Diagnosis Date  . Asthma   . Migraine   . Anxiety   . Mental disorder     huffed inhalants "affected brain"  . Stomach ulcer   . Diarrhea 06/04/2013  . Sore throat 06/04/2013  . Nausea 06/04/2013  . Pregnant 06/04/2013  . Kidney stones   . Pyelonephritis complicating pregnancy, antepartum, first trimester 11/05/2011    Past Surgical History: History reviewed. No pertinent past surgical history.  Family History: Family History  Problem Relation Age of Onset  . Cancer Maternal Grandmother     breast  . Cancer Paternal Grandfather     lung cancer  . Hypertension Other     Social History: History  Substance Use Topics  . Smoking status: Current Some Day Smoker -- 1.00 packs/day for 4 years    Types: Cigarettes  . Smokeless tobacco: Never Used  . Alcohol Use: No    Allergies:  Allergies  Allergen Reactions  . Codeine Nausea And Vomiting and Other (See Comments)    Ataxia/vertigo  . Penicillins Other (See Comments)    Reaction as a small child, unknown    Meds:  Prescriptions prior to admission  Medication Sig Dispense Refill  . acetaminophen (TYLENOL) 500 MG tablet Take 1,000 mg by mouth every 6 (six) hours as needed. pain      . albuterol (PROVENTIL HFA;VENTOLIN HFA) 108 (90 BASE) MCG/ACT inhaler Inhale 2 puffs into the lungs every 6 (six)  hours as needed for wheezing or shortness of breath.  1 Inhaler  2  . ALPRAZolam (XANAX) 0.25 MG tablet Take one tablet by mouth twice daily as needed for anxiety.  60 tablet  0  . azithromycin (ZITHROMAX Z-PAK) 250 MG tablet Take 2 tablets today and then 1 a day til finished  6 each  0  . butalbital-acetaminophen-caffeine (FIORICET) 50-325-40 MG per tablet Take 1-2 tablets by mouth every 6 (six) hours as needed for headache.  30 tablet  1  . ondansetron (ZOFRAN) 4 MG tablet Take 1 tablet (4 mg total) by mouth every 8 (eight) hours as needed for nausea or vomiting.  20 tablet  0  . predniSONE (DELTASONE) 10 MG tablet Take 4 tablets at once daily x 10 days  40 tablet  0  . prenatal vitamin w/FE, FA (PRENATAL 1 + 1) 27-1 MG TABS tablet Take 1 tablet by mouth daily.        Review of Systems   Constitutional: Negative for fever and chills Eyes: Negative for visual disturbances Respiratory: Negative for shortness of breath, dyspnea Cardiovascular: Negative for chest pain or palpitations  Gastrointestinal: Negative for vomiting, diarrhea and constipation Genitourinary: Negative for dysuria and urgency Musculoskeletal: Negative for back pain, joint pain, myalgias  Neurological: Negative for dizziness and headaches     Physical Exam  Blood pressure 123/65, pulse  110, temperature 98.1 F (36.7 C), temperature source Oral, resp. rate 18, height 5\' 2"  (1.575 m), weight 78.835 kg (173 lb 12.8 oz), last menstrual period 04/15/2013, unknown if currently breastfeeding. GENERAL: Well-developed, well-nourished female in no acute distress.  LUNGS: Clear to auscultation bilaterally.  HEART: Regular rate and rhythm. ABDOMEN: Soft, nontender, nondistended, gravid.  EXTREMITIES: Nontender, no edema, 2+ distal pulses. DTR's 2+ CERVICAL EXAM: Dilatation 1cm   Effacement 50%   Station -3   Presentation: cephalic FHT:  Baseline rate 150 bpm   Variability moderate  Accelerations present   Decelerations  none Contractions: irregular and rare   Labs: No results found for this or any previous visit (from the past 24 hour(s)). Imaging Studies:  No results found.  Assessment: Meagan [name] is  23 y.o. G3P1011 at [redacted]w[redacted]d presents with false labor.  Plan: Discouraged caster oil.  D/C home  CRESENZO-DISHMAN,Nedda Gains 7/30/20156:54 PM

## 2014-01-28 NOTE — Discharge Instructions (Signed)
Braxton Hicks Contractions °Contractions of the uterus can occur throughout pregnancy. Contractions are not always a sign that you are in labor.  °WHAT ARE BRAXTON HICKS CONTRACTIONS?  °Contractions that occur before labor are called Braxton Hicks contractions, or false labor. Toward the end of pregnancy (32-34 weeks), these contractions can develop more often and may become more forceful. This is not true labor because these contractions do not result in opening (dilatation) and thinning of the cervix. They are sometimes difficult to tell apart from true labor because these contractions can be forceful and people have different pain tolerances. You should not feel embarrassed if you go to the hospital with false labor. Sometimes, the only way to tell if you are in true labor is for your health care provider to look for changes in the cervix. °If there are no prenatal problems or other health problems associated with the pregnancy, it is completely safe to be sent home with false labor and await the onset of true labor. °HOW CAN YOU TELL THE DIFFERENCE BETWEEN TRUE AND FALSE LABOR? °False Labor °· The contractions of false labor are usually shorter and not as hard as those of true labor.   °· The contractions are usually irregular.   °· The contractions are often felt in the front of the lower abdomen and in the groin.   °· The contractions may go away when you walk around or change positions while lying down.   °· The contractions get weaker and are shorter lasting as time goes on.   °· The contractions do not usually become progressively stronger, regular, and closer together as with true labor.   °True Labor °· Contractions in true labor last 30-70 seconds, become very regular, usually become more intense, and increase in frequency.   °· The contractions do not go away with walking.   °· The discomfort is usually felt in the top of the uterus and spreads to the lower abdomen and low back.   °· True labor can be  determined by your health care provider with an exam. This will show that the cervix is dilating and getting thinner.   °WHAT TO REMEMBER °· Keep up with your usual exercises and follow other instructions given by your health care provider.   °· Take medicines as directed by your health care provider.   °· Keep your regular prenatal appointments.   °· Eat and drink lightly if you think you are going into labor.   °· If Braxton Hicks contractions are making you uncomfortable:   °¨ Change your position from lying down or resting to walking, or from walking to resting.   °¨ Sit and rest in a tub of warm water.   °¨ Drink 2-3 glasses of water. Dehydration may cause these contractions.   °¨ Do slow and deep breathing several times an hour.   °WHEN SHOULD I SEEK IMMEDIATE MEDICAL CARE? °Seek immediate medical care if: °· Your contractions become stronger, more regular, and closer together.   °· You have fluid leaking or gushing from your vagina.   °· You have a fever.   °· You pass blood-tinged mucus.   °· You have vaginal bleeding.   °· You have continuous abdominal pain.   °· You have low back pain that you never had before.   °· You feel your baby's head pushing down and causing pelvic pressure.   °· Your baby is not moving as much as it used to.   °Document Released: 06/18/2005 Document Revised: 06/23/2013 Document Reviewed: 03/30/2013 °[employer]® Patient Information ©2015 [employer]. This information is not intended to replace advice given to you by your health care   provider. Make sure you discuss any questions you have with your health care provider. ° °

## 2014-01-28 NOTE — MAU Note (Signed)
Pt states she has been having contractions last night, had an episode of vomiting last night and nausea today.

## 2014-01-28 NOTE — Telephone Encounter (Signed)
Nothing we/she can do to start labor.

## 2014-01-28 NOTE — Addendum Note (Signed)
Addended by: Jacklyn ShellRESENZO-DISHMON, Alzada Brazee on: 01/28/2014 01:49 PM   Modules accepted: Orders

## 2014-01-29 ENCOUNTER — Encounter (HOSPITAL_COMMUNITY): Payer: Self-pay | Admitting: *Deleted

## 2014-01-29 ENCOUNTER — Inpatient Hospital Stay (HOSPITAL_COMMUNITY)
Admission: AD | Admit: 2014-01-29 | Discharge: 2014-01-29 | Disposition: A | Discharge status: Home / Self Care | Payer: Medicaid Other | Source: Ambulatory Visit | Attending: Obstetrics and Gynecology | Admitting: Obstetrics and Gynecology

## 2014-01-29 DIAGNOSIS — O479 False labor, unspecified: Secondary | ICD-10-CM | POA: Diagnosis not present

## 2014-01-29 DIAGNOSIS — Z3483 Encounter for supervision of other normal pregnancy, third trimester: Secondary | ICD-10-CM

## 2014-01-29 MED ORDER — TRAMADOL HCL 50 MG PO TABS: 100.0000 mg | ORAL_TABLET | Freq: Once | ORAL | Status: DC

## 2014-01-29 NOTE — MAU Note (Signed)
Pt reports uc's since MN

## 2014-01-29 NOTE — Discharge Instructions (Signed)
Fetal Movement Counts °Patient Name: __________________________________________________ Patient Due Date: ____________________ °Performing a fetal movement count is highly recommended in high-risk pregnancies, but it is good for every pregnant woman to do. Your health care provider may ask you to start counting fetal movements at 28 weeks of the pregnancy. Fetal movements often increase: °· After eating a full meal. °· After physical activity. °· After eating or drinking something sweet or cold. °· At rest. °Pay attention to when you feel the baby is most active. This will help you notice a pattern of your baby's sleep and wake cycles and what factors contribute to an increase in fetal movement. It is important to perform a fetal movement count at the same time each day when your baby is normally most active.  °HOW TO COUNT FETAL MOVEMENTS °1. Find a quiet and comfortable area to sit or lie down on your left side. Lying on your left side provides the best blood and oxygen circulation to your baby. °2. Write down the day and time on a sheet of paper or in a journal. °3. Start counting kicks, flutters, swishes, rolls, or jabs in a 2-hour period. You should feel at least 10 movements within 2 hours. °4. If you do not feel 10 movements in 2 hours, wait 2-3 hours and count again. Look for a change in the pattern or not enough counts in 2 hours. °SEEK MEDICAL CARE IF: °· You feel less than 10 counts in 2 hours, tried twice. °· There is no movement in over an hour. °· The pattern is changing or taking longer each day to reach 10 counts in 2 hours. °· You feel the baby is not moving as he or she usually does. °Date: ____________ Movements: ____________ Start time: ____________ Finish time: ____________  °Date: ____________ Movements: ____________ Start time: ____________ Finish time: ____________ °Date: ____________ Movements: ____________ Start time: ____________ Finish time: ____________ °Date: ____________ Movements:  ____________ Start time: ____________ Finish time: ____________ °Date: ____________ Movements: ____________ Start time: ____________ Finish time: ____________ °Date: ____________ Movements: ____________ Start time: ____________ Finish time: ____________ °Date: ____________ Movements: ____________ Start time: ____________ Finish time: ____________ °Date: ____________ Movements: ____________ Start time: ____________ Finish time: ____________  °Date: ____________ Movements: ____________ Start time: ____________ Finish time: ____________ °Date: ____________ Movements: ____________ Start time: ____________ Finish time: ____________ °Date: ____________ Movements: ____________ Start time: ____________ Finish time: ____________ °Date: ____________ Movements: ____________ Start time: ____________ Finish time: ____________ °Date: ____________ Movements: ____________ Start time: ____________ Finish time: ____________ °Date: ____________ Movements: ____________ Start time: ____________ Finish time: ____________ °Date: ____________ Movements: ____________ Start time: ____________ Finish time: ____________  °Date: ____________ Movements: ____________ Start time: ____________ Finish time: ____________ °Date: ____________ Movements: ____________ Start time: ____________ Finish time: ____________ °Date: ____________ Movements: ____________ Start time: ____________ Finish time: ____________ °Date: ____________ Movements: ____________ Start time: ____________ Finish time: ____________ °Date: ____________ Movements: ____________ Start time: ____________ Finish time: ____________ °Date: ____________ Movements: ____________ Start time: ____________ Finish time: ____________ °Date: ____________ Movements: ____________ Start time: ____________ Finish time: ____________  °Date: ____________ Movements: ____________ Start time: ____________ Finish time: ____________ °Date: ____________ Movements: ____________ Start time: ____________ Finish  time: ____________ °Date: ____________ Movements: ____________ Start time: ____________ Finish time: ____________ °Date: ____________ Movements: ____________ Start time: ____________ Finish time: ____________ °Date: ____________ Movements: ____________ Start time: ____________ Finish time: ____________ °Date: ____________ Movements: ____________ Start time: ____________ Finish time: ____________ °Date: ____________ Movements: ____________ Start time: ____________ Finish time: ____________  °Date: ____________ Movements: ____________ Start time: ____________ Finish   time: ____________ °Date: ____________ Movements: ____________ Start time: ____________ Finish time: ____________ °Date: ____________ Movements: ____________ Start time: ____________ Finish time: ____________ °Date: ____________ Movements: ____________ Start time: ____________ Finish time: ____________ °Date: ____________ Movements: ____________ Start time: ____________ Finish time: ____________ °Date: ____________ Movements: ____________ Start time: ____________ Finish time: ____________ °Date: ____________ Movements: ____________ Start time: ____________ Finish time: ____________  °Date: ____________ Movements: ____________ Start time: ____________ Finish time: ____________ °Date: ____________ Movements: ____________ Start time: ____________ Finish time: ____________ °Date: ____________ Movements: ____________ Start time: ____________ Finish time: ____________ °Date: ____________ Movements: ____________ Start time: ____________ Finish time: ____________ °Date: ____________ Movements: ____________ Start time: ____________ Finish time: ____________ °Date: ____________ Movements: ____________ Start time: ____________ Finish time: ____________ °Date: ____________ Movements: ____________ Start time: ____________ Finish time: ____________  °Date: ____________ Movements: ____________ Start time: ____________ Finish time: ____________ °Date: ____________  Movements: ____________ Start time: ____________ Finish time: ____________ °Date: ____________ Movements: ____________ Start time: ____________ Finish time: ____________ °Date: ____________ Movements: ____________ Start time: ____________ Finish time: ____________ °Date: ____________ Movements: ____________ Start time: ____________ Finish time: ____________ °Date: ____________ Movements: ____________ Start time: ____________ Finish time: ____________ °Date: ____________ Movements: ____________ Start time: ____________ Finish time: ____________  °Date: ____________ Movements: ____________ Start time: ____________ Finish time: ____________ °Date: ____________ Movements: ____________ Start time: ____________ Finish time: ____________ °Date: ____________ Movements: ____________ Start time: ____________ Finish time: ____________ °Date: ____________ Movements: ____________ Start time: ____________ Finish time: ____________ °Date: ____________ Movements: ____________ Start time: ____________ Finish time: ____________ °Date: ____________ Movements: ____________ Start time: ____________ Finish time: ____________ °Document Released: 07/18/2006 Document Revised: 11/02/2013 Document Reviewed: 04/14/2012 °[employer]® Patient Information ©2015 [employer]. This information is not intended to replace advice given to you by your health care provider. Make sure you discuss any questions you have with your health care provider. °Braxton Hicks Contractions °Contractions of the uterus can occur throughout pregnancy. Contractions are not always a sign that you are in labor.  °WHAT ARE BRAXTON HICKS CONTRACTIONS?  °Contractions that occur before labor are called Braxton Hicks contractions, or false labor. Toward the end of pregnancy (32-34 weeks), these contractions can develop more often and may become more forceful. This is not true labor because these contractions do not result in opening (dilatation) and thinning of the cervix. They  are sometimes difficult to tell apart from true labor because these contractions can be forceful and people have different pain tolerances. You should not feel embarrassed if you go to the hospital with false labor. Sometimes, the only way to tell if you are in true labor is for your health care provider to look for changes in the cervix. °If there are no prenatal problems or other health problems associated with the pregnancy, it is completely safe to be sent home with false labor and await the onset of true labor. °HOW CAN YOU TELL THE DIFFERENCE BETWEEN TRUE AND FALSE LABOR? °False Labor °· The contractions of false labor are usually shorter and not as hard as those of true labor.   °· The contractions are usually irregular.   °· The contractions are often felt in the front of the lower abdomen and in the groin.   °· The contractions may go away when you walk around or change positions while lying down.   °· The contractions get weaker and are shorter lasting as time goes on.   °· The contractions do not usually become progressively stronger, regular, and closer together as with true labor.   °True Labor °· Contractions in true labor last 30-70 seconds, become   very regular, usually become more intense, and increase in frequency.   °· The contractions do not go away with walking.   °· The discomfort is usually felt in the top of the uterus and spreads to the lower abdomen and low back.   °· True labor can be determined by your health care provider with an exam. This will show that the cervix is dilating and getting thinner.   °WHAT TO REMEMBER °· Keep up with your usual exercises and follow other instructions given by your health care provider.   °· Take medicines as directed by your health care provider.   °· Keep your regular prenatal appointments.   °· Eat and drink lightly if you think you are going into labor.   °· If Braxton Hicks contractions are making you uncomfortable:   °¨ Change your position from  lying down or resting to walking, or from walking to resting.   °¨ Sit and rest in a tub of warm water.   °¨ Drink 2-3 glasses of water. Dehydration may cause these contractions.   °¨ Do slow and deep breathing several times an hour.   °WHEN SHOULD I SEEK IMMEDIATE MEDICAL CARE? °Seek immediate medical care if: °· Your contractions become stronger, more regular, and closer together.   °· You have fluid leaking or gushing from your vagina.   °· You have a fever.   °· You pass blood-tinged mucus.   °· You have vaginal bleeding.   °· You have continuous abdominal pain.   °· You have low back pain that you never had before.   °· You feel your baby's head pushing down and causing pelvic pressure.   °· Your baby is not moving as much as it used to.   °Document Released: 06/18/2005 Document Revised: 06/23/2013 Document Reviewed: 03/30/2013 °ExitCare® Patient Information ©2015 [employer]. This information is not intended to replace advice given to you by your health care provider. Make sure you discuss any questions you have with your health care provider. ° °

## 2014-02-03 ENCOUNTER — Ambulatory Visit (INDEPENDENT_AMBULATORY_CARE_PROVIDER_SITE_OTHER): Payer: Medicaid Other | Admitting: Advanced Practice Midwife

## 2014-02-03 VITALS — BP 130/68 | Wt 177.0 lb

## 2014-02-03 DIAGNOSIS — Z3483 Encounter for supervision of other normal pregnancy, third trimester: Secondary | ICD-10-CM

## 2014-02-03 DIAGNOSIS — Z331 Pregnant state, incidental: Secondary | ICD-10-CM

## 2014-02-03 DIAGNOSIS — Z1389 Encounter for screening for other disorder: Secondary | ICD-10-CM

## 2014-02-03 DIAGNOSIS — Z3480 Encounter for supervision of other normal pregnancy, unspecified trimester: Secondary | ICD-10-CM

## 2014-02-03 LAB — POCT URINALYSIS DIPSTICK
Blood, UA: NEGATIVE
Glucose, UA: NEGATIVE
Ketones, UA: NEGATIVE
Leukocytes, UA: NEGATIVE
Nitrite, UA: NEGATIVE
Protein, UA: NEGATIVE

## 2014-02-03 NOTE — Progress Notes (Signed)
[id number] 2069w1d Estimated Date of Delivery: 02/09/14  Last menstrual period 04/15/2013, unknown if currently breastfeeding.   BP weight and urine results all reviewed and noted.  Please refer to the obstetrical flow sheet for the fundal height and fetal heart rate documentation:  Patient reports good fetal movement, denies any bleeding and no rupture of membranes symptoms or regular contractions. Patient is without complaints except normal pregnancy complaints.  She came to [facility] twice in the same night last Thursday wanting to be in labor. Asked for membrane stripping today, but cx is long and 1cm,  It was discouraged d/t probability that it would only cause false labor, but pt says she doesn't care. All questions were answered.  Plan:  Continued routine obstetrical care,   Follow up in 1 weeks for OB appointment,

## 2014-02-10 ENCOUNTER — Ambulatory Visit (INDEPENDENT_AMBULATORY_CARE_PROVIDER_SITE_OTHER): Payer: Medicaid Other | Admitting: Advanced Practice Midwife

## 2014-02-10 VITALS — BP 136/82 | Wt 179.5 lb

## 2014-02-10 DIAGNOSIS — Z1389 Encounter for screening for other disorder: Secondary | ICD-10-CM

## 2014-02-10 DIAGNOSIS — O163 Unspecified maternal hypertension, third trimester: Secondary | ICD-10-CM

## 2014-02-10 DIAGNOSIS — Z331 Pregnant state, incidental: Secondary | ICD-10-CM

## 2014-02-10 DIAGNOSIS — Z3483 Encounter for supervision of other normal pregnancy, third trimester: Secondary | ICD-10-CM

## 2014-02-10 DIAGNOSIS — O139 Gestational [pregnancy-induced] hypertension without significant proteinuria, unspecified trimester: Secondary | ICD-10-CM

## 2014-02-10 LAB — POCT URINALYSIS DIPSTICK
Blood, UA: NEGATIVE
Glucose, UA: NEGATIVE
Ketones, UA: NEGATIVE
Leukocytes, UA: NEGATIVE
Nitrite, UA: NEGATIVE

## 2014-02-10 NOTE — Progress Notes (Signed)
[id number] 4524w1d Estimated Date of Delivery: 02/09/14  Blood pressure 138/84, weight 179 lb 8 oz (81.421 kg), last menstrual period 04/15/2013, unknown if currently breastfeeding.   BP weight and urine results all reviewed and noted.  Please refer to the obstetrical flow sheet for the fundal height and fetal heart rate documentation:  Patient reports good fetal movement, denies any bleeding and no rupture of membranes symptoms or regular contractions. Patient c/o seeing floaties, some blurred vision and HA.  DTR's 2+, trace edema All questions were answered.  Plan:  Continued routine obstetrical care, GHTN warning signs given.  PreX labs today. IOL scheduled for 8/12 at 1930 (needs ripening) if doesn't get induced for GHTN sooner  Follow up in 2days weeks for OB appointment, blood pressure check

## 2014-02-10 NOTE — Patient Instructions (Signed)

## 2014-02-11 ENCOUNTER — Telehealth: Payer: Self-pay | Admitting: Obstetrics and Gynecology

## 2014-02-11 LAB — COMPREHENSIVE METABOLIC PANEL
ALT: 9 U/L (ref 0–35)
AST: 7 U/L (ref 0–37)
Albumin: 3.1 g/dL — ABNORMAL LOW (ref 3.5–5.2)
Alkaline Phosphatase: 111 U/L (ref 39–117)
BUN: 7 mg/dL (ref 6–23)
CO2: 21 mEq/L (ref 19–32)
Calcium: 8.3 mg/dL — ABNORMAL LOW (ref 8.4–10.5)
Chloride: 105 mEq/L (ref 96–112)
Creat: 0.85 mg/dL (ref 0.50–1.10)
Glucose, Bld: 74 mg/dL (ref 70–99)
Potassium: 4.1 mEq/L (ref 3.5–5.3)
Sodium: 138 mEq/L (ref 135–145)
Total Bilirubin: 0.3 mg/dL (ref 0.2–1.2)
Total Protein: 5.4 g/dL — ABNORMAL LOW (ref 6.0–8.3)

## 2014-02-11 LAB — CBC
HCT: 36.3 % (ref 36.0–46.0)
Hemoglobin: 12.6 g/dL (ref 12.0–15.0)
MCH: 31 pg (ref 26.0–34.0)
MCHC: 34.7 g/dL (ref 30.0–36.0)
MCV: 89.2 fL (ref 78.0–100.0)
Platelets: 197 10*3/uL (ref 150–400)
RBC: 4.07 MIL/uL (ref 3.87–5.11)
RDW: 14.6 % (ref 11.5–15.5)
WBC: 10.1 10*3/uL (ref 4.0–10.5)

## 2014-02-11 LAB — PROTEIN / CREATININE RATIO, URINE
Creatinine, Urine: 263 mg/dL
Protein Creatinine Ratio: 0.04 (ref <0.15)
Total Protein, Urine: 11 mg/dL

## 2014-02-11 NOTE — Telephone Encounter (Signed)
Pt aware of WNL results from 02/10/2014.

## 2014-02-12 ENCOUNTER — Encounter (HOSPITAL_COMMUNITY): Payer: Self-pay | Admitting: General Practice

## 2014-02-12 ENCOUNTER — Telehealth (HOSPITAL_COMMUNITY): Payer: Self-pay | Admitting: *Deleted

## 2014-02-12 ENCOUNTER — Ambulatory Visit (INDEPENDENT_AMBULATORY_CARE_PROVIDER_SITE_OTHER): Payer: Medicaid Other | Admitting: Obstetrics and Gynecology

## 2014-02-12 ENCOUNTER — Inpatient Hospital Stay (HOSPITAL_COMMUNITY)
Admission: AD | Admit: 2014-02-12 | Discharge: 2014-02-15 | DRG: 774 | Disposition: A | Discharge status: Home / Self Care | Payer: Medicaid Other | Source: Ambulatory Visit | Attending: Obstetrics and Gynecology | Admitting: Obstetrics and Gynecology

## 2014-02-12 VITALS — BP 144/80 | Wt 178.2 lb

## 2014-02-12 DIAGNOSIS — O99334 Smoking (tobacco) complicating childbirth: Secondary | ICD-10-CM | POA: Diagnosis present

## 2014-02-12 DIAGNOSIS — Z3483 Encounter for supervision of other normal pregnancy, third trimester: Secondary | ICD-10-CM

## 2014-02-12 DIAGNOSIS — Z87442 Personal history of urinary calculi: Secondary | ICD-10-CM | POA: Diagnosis not present

## 2014-02-12 DIAGNOSIS — F411 Generalized anxiety disorder: Secondary | ICD-10-CM | POA: Diagnosis present

## 2014-02-12 DIAGNOSIS — Z331 Pregnant state, incidental: Secondary | ICD-10-CM

## 2014-02-12 DIAGNOSIS — IMO0002 Reserved for concepts with insufficient information to code with codable children: Principal | ICD-10-CM | POA: Diagnosis present

## 2014-02-12 DIAGNOSIS — Z8249 Family history of ischemic heart disease and other diseases of the circulatory system: Secondary | ICD-10-CM | POA: Diagnosis not present

## 2014-02-12 DIAGNOSIS — O99344 Other mental disorders complicating childbirth: Secondary | ICD-10-CM | POA: Diagnosis present

## 2014-02-12 DIAGNOSIS — J45909 Unspecified asthma, uncomplicated: Secondary | ICD-10-CM | POA: Diagnosis present

## 2014-02-12 DIAGNOSIS — Z1389 Encounter for screening for other disorder: Secondary | ICD-10-CM

## 2014-02-12 DIAGNOSIS — H539 Unspecified visual disturbance: Secondary | ICD-10-CM | POA: Diagnosis present

## 2014-02-12 DIAGNOSIS — O139 Gestational [pregnancy-induced] hypertension without significant proteinuria, unspecified trimester: Secondary | ICD-10-CM

## 2014-02-12 LAB — COMPREHENSIVE METABOLIC PANEL
ALT: 8 U/L (ref 0–35)
AST: 8 U/L (ref 0–37)
Albumin: 2.7 g/dL — ABNORMAL LOW (ref 3.5–5.2)
Alkaline Phosphatase: 127 U/L — ABNORMAL HIGH (ref 39–117)
Anion gap: 12 (ref 5–15)
BUN: 9 mg/dL (ref 6–23)
CO2: 21 mEq/L (ref 19–32)
Calcium: 8.9 mg/dL (ref 8.4–10.5)
Chloride: 102 mEq/L (ref 96–112)
Creatinine, Ser: 0.66 mg/dL (ref 0.50–1.10)
GFR calc Af Amer: >90 mL/min (ref >90)
GFR calc non Af Amer: >90 mL/min (ref >90)
Glucose, Bld: 72 mg/dL (ref 70–99)
Potassium: 4.4 mEq/L (ref 3.7–5.3)
Sodium: 135 mEq/L — ABNORMAL LOW (ref 137–147)
Total Bilirubin: <0.2 mg/dL — ABNORMAL LOW (ref 0.3–1.2)
Total Protein: 6.1 g/dL (ref 6.0–8.3)

## 2014-02-12 LAB — POCT URINALYSIS DIPSTICK
Blood, UA: NEGATIVE
Glucose, UA: NEGATIVE
Ketones, UA: NEGATIVE
Nitrite, UA: NEGATIVE

## 2014-02-12 LAB — PROTEIN / CREATININE RATIO, URINE
Creatinine, Urine: 51.63 mg/dL
Protein Creatinine Ratio: 0.08 (ref 0.00–0.15)
Total Protein, Urine: 4.2 mg/dL

## 2014-02-12 LAB — SAMPLE TO BLOOD BANK

## 2014-02-12 LAB — CBC
HCT: 38.1 % (ref 36.0–46.0)
Hemoglobin: 13 g/dL (ref 12.0–15.0)
MCH: 31.7 pg (ref 26.0–34.0)
MCHC: 34.1 g/dL (ref 30.0–36.0)
MCV: 92.9 fL (ref 78.0–100.0)
Platelets: 171 10*3/uL (ref 150–400)
RBC: 4.1 MIL/uL (ref 3.87–5.11)
RDW: 14 % (ref 11.5–15.5)
WBC: 11.9 10*3/uL — ABNORMAL HIGH (ref 4.0–10.5)

## 2014-02-12 MED ORDER — ONDANSETRON HCL 4 MG/2ML IJ SOLN: 4.0000 mg | Freq: Four times a day (QID) | INTRAMUSCULAR | Status: DC | PRN

## 2014-02-12 MED ORDER — OXYTOCIN BOLUS FROM INFUSION: 500.0000 mL | INTRAVENOUS | Status: DC

## 2014-02-12 MED ORDER — CITRIC ACID-SODIUM CITRATE 334-500 MG/5ML PO SOLN: 30.0000 mL | ORAL | Status: DC | PRN

## 2014-02-12 MED ORDER — IBUPROFEN 600 MG PO TABS
600.0000 mg | ORAL_TABLET | Freq: Four times a day (QID) | ORAL | Status: DC | PRN
  Administered 2014-02-13: 600 mg via ORAL
  Filled 2014-02-12: qty 1

## 2014-02-12 MED ORDER — OXYTOCIN 40 UNITS IN LACTATED RINGERS INFUSION - SIMPLE MED
62.5000 mL/h | INTRAVENOUS | Status: DC
  Administered 2014-02-13: 62.5 mL/h via INTRAVENOUS
  Filled 2014-02-12: qty 1000

## 2014-02-12 MED ORDER — LACTATED RINGERS IV SOLN: 500.0000 mL | INTRAVENOUS | Status: DC | PRN

## 2014-02-12 MED ORDER — MISOPROSTOL 25 MCG QUARTER TABLET
25.0000 ug | ORAL_TABLET | ORAL | Status: DC
  Administered 2014-02-12 – 2014-02-13 (×3): 25 ug via VAGINAL
  Filled 2014-02-12 (×2): qty 1
  Filled 2014-02-12: qty 0.25
  Filled 2014-02-12 (×3): qty 1
  Filled 2014-02-12: qty 0.25
  Filled 2014-02-12: qty 1
  Filled 2014-02-12: qty 0.25
  Filled 2014-02-12: qty 1

## 2014-02-12 MED ORDER — FENTANYL CITRATE 0.05 MG/ML IJ SOLN
100.0000 ug | INTRAMUSCULAR | Status: DC | PRN
  Administered 2014-02-13: 100 ug via INTRAVENOUS
  Filled 2014-02-12: qty 2

## 2014-02-12 MED ORDER — ZOLPIDEM TARTRATE 5 MG PO TABS: 5.0000 mg | ORAL_TABLET | Freq: Every evening | ORAL | Status: DC | PRN

## 2014-02-12 MED ORDER — LACTATED RINGERS IV SOLN
INTRAVENOUS | Status: DC
  Administered 2014-02-12 – 2014-02-13 (×2): via INTRAVENOUS

## 2014-02-12 MED ORDER — ACETAMINOPHEN 325 MG PO TABS
650.0000 mg | ORAL_TABLET | ORAL | Status: DC | PRN
  Administered 2014-02-13: 650 mg via ORAL
  Filled 2014-02-12: qty 2

## 2014-02-12 MED ORDER — LIDOCAINE HCL (PF) 1 % IJ SOLN
30.0000 mL | INTRAMUSCULAR | Status: DC | PRN
  Filled 2014-02-12: qty 30

## 2014-02-12 MED ORDER — TERBUTALINE SULFATE 1 MG/ML IJ SOLN: 0.2500 mg | Freq: Once | INTRAMUSCULAR | Status: AC | PRN

## 2014-02-12 NOTE — H&P (Signed)
Meagan [name] is a 23 y.o. female G3P1011 @ 40.3wks presenting for eval of visual disburbances. Pt appears to be anxious and reports that she feels 'clammy' which is a symptom of anxiety for her.  Denies reg ctx, leaking or bldg. Her preg has been followed by the [facility] service and has been remarkable for 1) anxiety with prn Xanax 2) smoker History OB History   Grav Para Term Preterm Abortions TAB SAB Ect Mult Living   3 1 1  0 1 0 1 0 0 1     Past Medical History  Diagnosis Date  . Migraine   . Anxiety   . Mental disorder     huffed inhalants "affected brain"  . Stomach ulcer   . Diarrhea 06/04/2013  . Sore throat 06/04/2013  . Nausea 06/04/2013  . Pregnant 06/04/2013  . Kidney stones   . Pyelonephritis complicating pregnancy, antepartum, first trimester 11/05/2011  . Asthma     inhaler at bedside   History reviewed. No pertinent past surgical history. Family History: family history includes Cancer in her maternal grandmother and paternal grandfather; Hypertension in her other. Social History:  reports that she has been smoking Cigarettes.  She has a 7 pack-year smoking history. She has never used smokeless tobacco. She reports that she does not drink alcohol or use illicit drugs.   Prenatal Transfer Tool  Maternal Diabetes: No Genetic Screening: Normal Maternal Ultrasounds/Referrals: Normal Fetal Ultrasounds or other Referrals:  None Maternal Substance Abuse:  Yes:  Type: Smoker Significant Maternal Medications:  Meds include: Other:  Xanax prn Significant Maternal Lab Results:  None Other Comments:  None  ROS  Dilation: 1 Effacement (%): Thick Station: -2 Exam by:: C.Byers, RN Blood pressure 122/73, pulse 110, temperature 98 F (36.7 C), temperature source Oral, resp. rate 18, height 5\' 2"  (1.575 m), weight 80.74 kg (178 lb), last menstrual period 04/15/2013, unknown if currently breastfeeding. Exam Physical Exam  Constitutional: She is oriented to person,  place, and time. She appears well-developed.  HENT:  Head: Normocephalic.  Neck: Normal range of motion.  Cardiovascular:  Tachy at times  Respiratory: Effort normal.  GI:  EFM reactive, +accels, no decels, Cat 1 Irreg ctx  Genitourinary:  Cx 1/thick  Musculoskeletal: Normal range of motion.  Neurological: She is alert and oriented to person, place, and time.  Skin: Skin is warm and dry.  Psychiatric: Her behavior is normal.  Sl anxious demeanor, but seems to calm easily    CBC    Component Value Date/Time   WBC 11.9* 02/12/2014 2016   RBC 4.10 02/12/2014 2016   HGB 13.0 02/12/2014 2016   HCT 38.1 02/12/2014 2016   PLT 171 02/12/2014 2016   MCV 92.9 02/12/2014 2016   MCH 31.7 02/12/2014 2016   MCHC 34.1 02/12/2014 2016   RDW 14.0 02/12/2014 2016   LYMPHSABS 0.6* 08/01/2013 1616   MONOABS 0.3 08/01/2013 1616   EOSABS 0.0 08/01/2013 1616   BASOSABS 0.0 08/01/2013 1616   CMP     Component Value Date/Time   NA 135* 02/12/2014 2016   K 4.4 02/12/2014 2016   CL 102 02/12/2014 2016   CO2 21 02/12/2014 2016   GLUCOSE 72 02/12/2014 2016   BUN 9 02/12/2014 2016   CREATININE 0.66 02/12/2014 2016   CREATININE 0.85 02/10/2014 1403   CALCIUM 8.9 02/12/2014 2016   PROT 6.1 02/12/2014 2016   ALBUMIN 2.7* 02/12/2014 2016   AST 8 02/12/2014 2016   ALT 8 02/12/2014 2016  ALKPHOS 127* 02/12/2014 2016   BILITOT <0.2* 02/12/2014 2016   GFRNONAA >90 02/12/2014 2016   GFRAA >90 02/12/2014 2016   Urine P/C ratio: 0.08  Prenatal labs: ABO, Rh: A/NEG/-- (12/31 1405) Antibody: NEG (05/12 0915) Rubella: 1.25 (12/31 1405) RPR: NON REAC (05/12 0915)  HBsAg: NEGATIVE (12/31 1405)  HIV: NONREACTIVE (05/12 0915)  GBS: Negative (07/28 0000)   Assessment/Plan: IUP @ 40.3wks Possibly symptomatic for preeclampsia vs anxiety Labile BPs  [facility] to possible preeclampsia with visual disturbances.  Begin cytotec for cx ripening Anticipate SVD   SHAW, KIMBERLY CNM 02/12/2014, 11:49 PM  I  had seen this patient in the office earlier in the day, with borderline BP's 132-145/82-92 over repeated measurements. Reflexes 3+-4+. Recent negative PIH labs. Pt had been having some visual symptoms, of floaters, scotoma, but she presents tonight complaining of a significant increase in visual changes, along with a right sided headache. Pt with prior history of induction for preeclampsia with the first infant.  Since admit the headache has resolved. PIH labs are negative. Admitted for possible preeclampsia symptoms at 40+ wks for IOL, but there is an anxiety component that has been difficult to differentiate [facility].   `````Attestation of Attending Supervision of Advanced Practitioner: Evaluation and management procedures were performed by the PA/NP/CNM/OB Fellow under my supervision/collaboration. Chart reviewed and agree with management and plan.  Lonya Johannesen V 02/13/2014 4:00 AM

## 2014-02-12 NOTE — Progress Notes (Signed)
[id number] 3388w3d Estimated Date of Delivery: 02/09/14  Blood pressure 144/80, weight 178 lb 3.2 oz (80.831 kg), last menstrual period 04/15/2013, unknown if currently breastfeeding.   Refer to the ob flow sheet for FH and FHR, also BP, Wt, Urine results:  Patient reports good fetal movement, denies any bleeding and no rupture of membranes symptoms or regular contractions.    Physical Examination: General appearance - alert, well appearing, and in no distress Mental status - alert, oriented to person, place, and time Neurological - Reflexes are 3+ to 4+ without clonus.   Bedside u/s shows an AFI of 11.1 cm.  BP was 132/84 at bedside   Patient complaints: She is complaining of diaphoresis and states that her feet and hands will be "clamy and sweaty all the time".  She is also complaining of blurry vision and seeing "black spots" in her eyes that started a month ago.  Confirms photophobia for the past week and wears sunglasses to protect her eyes.  She has experienced these symptoms before when she was [redacted] weeks pregnant with her first child.  She has a history of anxiety and panic attacks but denies experiencing these symptoms currently.  She does not smoke as frequently as she used to.   Questions were answered. Plan: IOL scheduled for next week.  Return earlier if headache or change in vision.  F/u in 5 days for induction.

## 2014-02-13 ENCOUNTER — Inpatient Hospital Stay (HOSPITAL_COMMUNITY): Payer: Medicaid Other | Admitting: Anesthesiology

## 2014-02-13 ENCOUNTER — Encounter (HOSPITAL_COMMUNITY): Payer: Medicaid Other | Admitting: Anesthesiology

## 2014-02-13 ENCOUNTER — Encounter (HOSPITAL_COMMUNITY): Payer: Self-pay

## 2014-02-13 DIAGNOSIS — IMO0002 Reserved for concepts with insufficient information to code with codable children: Secondary | ICD-10-CM

## 2014-02-13 DIAGNOSIS — J45909 Unspecified asthma, uncomplicated: Secondary | ICD-10-CM

## 2014-02-13 DIAGNOSIS — O1490 Unspecified pre-eclampsia, unspecified trimester: Secondary | ICD-10-CM

## 2014-02-13 DIAGNOSIS — F411 Generalized anxiety disorder: Secondary | ICD-10-CM

## 2014-02-13 DIAGNOSIS — O99344 Other mental disorders complicating childbirth: Secondary | ICD-10-CM

## 2014-02-13 LAB — CBC
HCT: 38.7 % (ref 36.0–46.0)
Hemoglobin: 12.9 g/dL (ref 12.0–15.0)
MCH: 31.2 pg (ref 26.0–34.0)
MCHC: 33.3 g/dL (ref 30.0–36.0)
MCV: 93.7 fL (ref 78.0–100.0)
Platelets: 162 10*3/uL (ref 150–400)
RBC: 4.13 MIL/uL (ref 3.87–5.11)
RDW: 14.1 % (ref 11.5–15.5)
WBC: 12.6 10*3/uL — ABNORMAL HIGH (ref 4.0–10.5)

## 2014-02-13 LAB — RPR

## 2014-02-13 MED ORDER — PANTOPRAZOLE SODIUM 40 MG PO TBEC: 40.0000 mg | DELAYED_RELEASE_TABLET | Freq: Every day | ORAL | Status: DC

## 2014-02-13 MED ORDER — PANTOPRAZOLE SODIUM 40 MG PO TBEC
40.0000 mg | DELAYED_RELEASE_TABLET | Freq: Every day | ORAL | Status: DC
  Administered 2014-02-13 – 2014-02-15 (×3): 40 mg via ORAL
  Filled 2014-02-13 (×3): qty 1

## 2014-02-13 MED ORDER — FLEET ENEMA 7-19 GM/118ML RE ENEM: 1.0000 | ENEMA | Freq: Every day | RECTAL | Status: DC | PRN

## 2014-02-13 MED ORDER — DIBUCAINE 1 % RE OINT: 1.0000 "application " | TOPICAL_OINTMENT | RECTAL | Status: DC | PRN

## 2014-02-13 MED ORDER — EPHEDRINE 5 MG/ML INJ
10.0000 mg | INTRAVENOUS | Status: DC | PRN
Start: 2014-02-13 — End: 2014-02-13
  Filled 2014-02-13: qty 2

## 2014-02-13 MED ORDER — OXYCODONE-ACETAMINOPHEN 5-325 MG PO TABS: 1.0000 | ORAL_TABLET | ORAL | Status: DC | PRN

## 2014-02-13 MED ORDER — MEASLES, MUMPS & RUBELLA VAC ~~LOC~~ INJ
0.5000 mL | INJECTION | Freq: Once | SUBCUTANEOUS | Status: DC
  Filled 2014-02-13: qty 0.5

## 2014-02-13 MED ORDER — DIPHENHYDRAMINE HCL 25 MG PO CAPS: 25.0000 mg | ORAL_CAPSULE | Freq: Four times a day (QID) | ORAL | Status: DC | PRN

## 2014-02-13 MED ORDER — SODIUM CHLORIDE 0.9 % IV SOLN: 250.0000 mL | INTRAVENOUS | Status: DC | PRN

## 2014-02-13 MED ORDER — SENNOSIDES-DOCUSATE SODIUM 8.6-50 MG PO TABS
2.0000 | ORAL_TABLET | ORAL | Status: DC
  Administered 2014-02-15: 2 via ORAL
  Filled 2014-02-13: qty 2

## 2014-02-13 MED ORDER — OXYTOCIN 40 UNITS IN LACTATED RINGERS INFUSION - SIMPLE MED: 62.5000 mL/h | INTRAVENOUS | Status: DC | PRN

## 2014-02-13 MED ORDER — LANOLIN HYDROUS EX OINT: TOPICAL_OINTMENT | CUTANEOUS | Status: DC | PRN

## 2014-02-13 MED ORDER — TETANUS-DIPHTH-ACELL PERTUSSIS 5-2.5-18.5 LF-MCG/0.5 IM SUSP: 0.5000 mL | Freq: Once | INTRAMUSCULAR | Status: DC

## 2014-02-13 MED ORDER — BENZOCAINE-MENTHOL 20-0.5 % EX AERO
1.0000 "application " | INHALATION_SPRAY | CUTANEOUS | Status: DC | PRN
  Administered 2014-02-14: 1 via TOPICAL
  Filled 2014-02-13: qty 56

## 2014-02-13 MED ORDER — WITCH HAZEL-GLYCERIN EX PADS: 1.0000 "application " | MEDICATED_PAD | CUTANEOUS | Status: DC | PRN

## 2014-02-13 MED ORDER — SODIUM CHLORIDE 0.9 % IJ SOLN: 3.0000 mL | Freq: Two times a day (BID) | INTRAMUSCULAR | Status: DC

## 2014-02-13 MED ORDER — PHENYLEPHRINE 40 MCG/ML (10ML) SYRINGE FOR IV PUSH (FOR BLOOD PRESSURE SUPPORT)
80.0000 ug | PREFILLED_SYRINGE | INTRAVENOUS | Status: DC | PRN
  Filled 2014-02-13: qty 2

## 2014-02-13 MED ORDER — PRENATAL MULTIVITAMIN CH
1.0000 | ORAL_TABLET | Freq: Every day | ORAL | Status: DC
  Administered 2014-02-14: 1 via ORAL

## 2014-02-13 MED ORDER — ONDANSETRON HCL 4 MG PO TABS: 4.0000 mg | ORAL_TABLET | ORAL | Status: DC | PRN

## 2014-02-13 MED ORDER — PANTOPRAZOLE SODIUM 20 MG PO TBEC: 20.0000 mg | DELAYED_RELEASE_TABLET | Freq: Every day | ORAL | Status: DC

## 2014-02-13 MED ORDER — FENTANYL 2.5 MCG/ML BUPIVACAINE 1/10 % EPIDURAL INFUSION (WH - ANES)
14.0000 mL/h | INTRAMUSCULAR | Status: DC | PRN
  Administered 2014-02-13: 14 mL/h via EPIDURAL
  Filled 2014-02-13: qty 125

## 2014-02-13 MED ORDER — SIMETHICONE 80 MG PO CHEW
80.0000 mg | CHEWABLE_TABLET | ORAL | Status: DC | PRN
  Administered 2014-02-14: 80 mg via ORAL
  Filled 2014-02-13: qty 1

## 2014-02-13 MED ORDER — LIDOCAINE HCL (PF) 1 % IJ SOLN
INTRAMUSCULAR | Status: DC | PRN
  Administered 2014-02-13 (×2): 5 mL

## 2014-02-13 MED ORDER — LACTATED RINGERS IV SOLN
500.0000 mL | Freq: Once | INTRAVENOUS | Status: AC
  Administered 2014-02-13: 500 mL via INTRAVENOUS

## 2014-02-13 MED ORDER — EPHEDRINE 5 MG/ML INJ
10.0000 mg | INTRAVENOUS | Status: DC | PRN
  Filled 2014-02-13: qty 2

## 2014-02-13 MED ORDER — DIPHENHYDRAMINE HCL 50 MG/ML IJ SOLN: 12.5000 mg | INTRAMUSCULAR | Status: DC | PRN

## 2014-02-13 MED ORDER — ALPRAZOLAM 0.25 MG PO TABS
0.2500 mg | ORAL_TABLET | Freq: Two times a day (BID) | ORAL | Status: DC | PRN
  Administered 2014-02-13 – 2014-02-14 (×2): 0.25 mg via ORAL
  Filled 2014-02-13 (×3): qty 1

## 2014-02-13 MED ORDER — BISACODYL 10 MG RE SUPP: 10.0000 mg | Freq: Every day | RECTAL | Status: DC | PRN

## 2014-02-13 MED ORDER — IBUPROFEN 600 MG PO TABS
600.0000 mg | ORAL_TABLET | Freq: Four times a day (QID) | ORAL | Status: DC
  Filled 2014-02-13 (×2): qty 1

## 2014-02-13 MED ORDER — PHENYLEPHRINE 40 MCG/ML (10ML) SYRINGE FOR IV PUSH (FOR BLOOD PRESSURE SUPPORT)
80.0000 ug | PREFILLED_SYRINGE | INTRAVENOUS | Status: DC | PRN
  Filled 2014-02-13: qty 2
  Filled 2014-02-13: qty 10

## 2014-02-13 MED ORDER — ALPRAZOLAM 0.25 MG PO TABS
0.2500 mg | ORAL_TABLET | Freq: Two times a day (BID) | ORAL | Status: DC | PRN
  Administered 2014-02-13: 0.25 mg via ORAL
  Filled 2014-02-13: qty 1

## 2014-02-13 MED ORDER — ACETAMINOPHEN 325 MG PO TABS
650.0000 mg | ORAL_TABLET | ORAL | Status: DC | PRN
  Administered 2014-02-13 – 2014-02-15 (×4): 650 mg via ORAL
  Filled 2014-02-13 (×5): qty 2

## 2014-02-13 MED ORDER — ZOLPIDEM TARTRATE 5 MG PO TABS: 5.0000 mg | ORAL_TABLET | Freq: Every evening | ORAL | Status: DC | PRN

## 2014-02-13 MED ORDER — SODIUM CHLORIDE 0.9 % IJ SOLN: 3.0000 mL | INTRAMUSCULAR | Status: DC | PRN

## 2014-02-13 MED ORDER — ONDANSETRON HCL 4 MG/2ML IJ SOLN: 4.0000 mg | INTRAMUSCULAR | Status: DC | PRN

## 2014-02-13 NOTE — Progress Notes (Signed)
Meagan [name] is a 23 y.o. G3P1011 at [redacted]w[redacted]d by ultrasound admitted for induction of labor due to mild preeclampsia symptoms of unilateral headache and increasing visual symptoms,, with anxiety component felt to be present.  Subjective: Pt's headache noted at admit has resolved, and visual sx are stable. No longer wearing dark glasses. Objective: BP 132/86  Pulse 79  Temp(Src) 98.3 F (36.8 C) (Oral)  Resp 18  Ht 5\' 2"  (1.575 m)  Wt 178 lb (80.74 kg)  BMI 32.55 kg/m2  LMP 04/15/2013      FHT:  FHR: 140 bpm, variability: moderate,  accelerations:  Present,  decelerations:  Absent UC:   irregular, every 4-8 minutes SVE:   Dilation: 2 Effacement (%): 20 Station: -2 Exam by:: Dr. Emelda [name] Second Cytotec inserted at 4 am. Labs: Lab Results  Component Value Date   WBC 11.9* 02/12/2014   HGB 13.0 02/12/2014   HCT 38.1 02/12/2014   MCV 92.9 02/12/2014   PLT 171 02/12/2014    Assessment / Plan: Induction of labor due to ? preeclampsia sx.,  progressing well on pitocin  Labor: cervical ripening continuing. Preeclampsia:  labs stable Fetal Wellbeing:  Category I Pain Control:  Labor support without medications I/D:  n/a Anticipated MOD:  NSVD  Meagan [name] 02/13/2014, 4:18 AM

## 2014-02-13 NOTE — Anesthesia Procedure Notes (Signed)
Epidural Patient location during procedure: OB Start time: 02/13/2014 6:51 AM  Staffing Anesthesiologist: Brayton CavesJACKSON, Omara Alcon Performed by: anesthesiologist   Preanesthetic Checklist Completed: patient identified, site marked, surgical consent, pre-op evaluation, timeout performed, IV checked, risks and benefits discussed and monitors and equipment checked  Epidural Patient position: sitting Prep: site prepped and draped and DuraPrep Patient monitoring: continuous pulse ox and blood pressure Approach: midline Location: L3-L4 Injection technique: LOR air  Needle:  Needle type: Tuohy  Needle gauge: 17 G Needle length: 9 cm and 9 Needle insertion depth: 5 cm cm Catheter type: closed end flexible Catheter size: 19 Gauge Catheter at skin depth: 10 cm Test dose: negative  Assessment Events: blood not aspirated, injection not painful, no injection resistance, negative IV test and no paresthesia  Additional Notes Patient identified.  Risk benefits discussed including failed block, incomplete pain control, headache, nerve damage, paralysis, blood pressure changes, nausea, vomiting, reactions to medication both toxic or allergic, and postpartum back pain.  Patient expressed understanding and wished to proceed.  All questions were answered.  Sterile technique used throughout procedure and epidural site dressed with sterile barrier dressing. No paresthesia or other complications noted.The patient did not experience any signs of intravascular injection such as tinnitus or metallic taste in mouth nor signs of intrathecal spread such as rapid motor block. Please see nursing notes for vital signs.

## 2014-02-13 NOTE — Plan of Care (Signed)
Problem: Discharge Progression Outcomes Goal: Barriers To Progression Addressed/Resolved Outcome: Progressing [facility]  Order received for xanax  Patient given xanax  Husband supportive

## 2014-02-13 NOTE — Anesthesia Postprocedure Evaluation (Signed)
  Anesthesia Post-op Note  Patient: Meagan HoraCatherine Currier  Procedure(s) Performed: * No procedures listed *  Patient Location: Mother/Baby  Anesthesia Type:Epidural  Level of Consciousness: awake and alert   Airway and Oxygen Therapy: Patient Spontanous Breathing  Post-op Pain: mild  Post-op Assessment: Post-op Vital signs reviewed, No signs of Nausea or vomiting, Pain level controlled, No headache, No residual numbness and No residual motor weakness  Post-op Vital Signs: Reviewed  Last Vitals:  Filed Vitals:   02/13/14 1430  BP: 114/51  Pulse: 79  Temp: 37.2 C  Resp: 16    Complications: No apparent anesthesia complications

## 2014-02-13 NOTE — Progress Notes (Signed)
Pt. declines pain medication ordered and prefers to take Tylenol only. Telephone call to doctor to share information and request order for Tylenol. Will follow up with new orders.

## 2014-02-13 NOTE — Lactation Note (Signed)
This note was copied from the chart of Meagan Montey HoraCatherine Dini. Lactation Consultation Note  Patient Name: Meagan [name] EAVWU'JToday's Date: 02/13/2014 Reason for consult: Initial assessment Baby 13 hours of life. Mom complaining of painful nipples. Assisted mom to latch baby in football position on right breast. Baby latches deeply, suckles rhythmically with a few swallows noted. Showed mom how lips are flanged and nipple deeply in mouth. Mom complains of pain for first 1-2 minutes of nursing, then reports increased comfort. Mom states that she really wants to BF. Mom able to hand express colostrum. Mom able to latch baby back onto breast herself. Again, initial latching is painful, but subsides as baby nurses. Enc mom to make sure to maintain a deep latch and re-latch if necessary. Mom given medication information from [facility] "Medications and Mother's Milk" reference regarding Xanax and enc to discuss with pediatrician if she has any concerns. Mom states that she had discussed medication and breastfeeding with her OB. [facility] given [facility], aware of OP/BFSG and community resources. Enc mom to call for assistance with latching as needed.  Maternal Data Has patient been taught Hand Expression?: Yes Does the patient have breastfeeding experience prior to this delivery?: No  Feeding Feeding Type: Breast Fed Length of feed: 10 min  LATCH Score/Interventions Latch: Grasps breast easily, tongue down, lips flanged, rhythmical sucking.  Audible Swallowing: A few with stimulation Intervention(s): Hand expression  Type of Nipple: Everted at rest and after stimulation  Comfort (Breast/Nipple): Engorged, cracked, bleeding, large blisters, severe discomfort (Mom complains of pain when baby latches. Mom did state pain subsided after 1-2 minutes.)     Hold (Positioning): Assistance needed to correctly position infant at breast and maintain latch. Intervention(s): Breastfeeding basics  reviewed;Support Pillows;Position options;Skin to skin  LATCH Score: 6  Lactation Tools Discussed/Used     Consult Status Consult Status: Follow-up Date: 02/14/14 Follow-up type: In-patient    Geralynn OchsWILLIARD, Da Authement 02/13/2014, 8:59 PM

## 2014-02-13 NOTE — Plan of Care (Signed)
Problem: Phase I Progression Outcomes Goal: Other Phase I Outcomes/Goals Outcome: Progressing Pt. Has history of anxiety/mood disorder that is currently treated  with Xanax, twice daily. Noted flat affect, inconsistent with situation or patients perception of situation. Multiple visitors encouraged to limit visit to reduce patient anxiety. Discussed other methods for reduction of stress, but allowed patient to make final decision.  Will continue to monitor patient needs for support and rest.

## 2014-02-13 NOTE — Anesthesia Preprocedure Evaluation (Signed)
Anesthesia Evaluation  Patient identified by MRN, date of birth, ID band Patient awake    Reviewed: Allergy & Precautions, H&P , Patient's Chart, lab work & pertinent test results  Airway Mallampati: II TM Distance: >3 FB Neck ROM: full    Dental   Pulmonary asthma , Current Smoker,  breath sounds clear to auscultation        Cardiovascular Rhythm:regular Rate:Normal     Neuro/Psych  Headaches,    GI/Hepatic PUD,   Endo/Other    Renal/GU Renal disease     Musculoskeletal   Abdominal   Peds  Hematology   Anesthesia Other Findings   Reproductive/Obstetrics (+) Pregnancy                           Anesthesia Physical Anesthesia Plan  ASA: II  Anesthesia Plan: Epidural   Post-op Pain Management:    Induction:   Airway Management Planned:   Additional Equipment:   Intra-op Plan:   Post-operative Plan:   Informed Consent: I have reviewed the patients History and Physical, chart, labs and discussed the procedure including the risks, benefits and alternatives for the proposed anesthesia with the patient or authorized representative who has indicated his/her understanding and acceptance.     Plan Discussed with:   Anesthesia Plan Comments:         Anesthesia Quick Evaluation

## 2014-02-14 MED ORDER — RHO D IMMUNE GLOBULIN 1500 UNIT/2ML IJ SOSY
300.0000 ug | PREFILLED_SYRINGE | Freq: Once | INTRAMUSCULAR | Status: AC
  Administered 2014-02-14: 300 ug via INTRAMUSCULAR
  Filled 2014-02-14: qty 2

## 2014-02-14 NOTE — Progress Notes (Signed)
Clinical Social Work Department PSYCHOSOCIAL ASSESSMENT - MATERNAL/CHILD 02/14/2014  Patient:  Meagan [name],Meagan [name]  Account Number:  [id number]  Admit Date:  02/12/2014  Childs Name:   Meagan [name]    Clinical Social Worker:  Lyncoln Maskell, LCSW   Date/Time:  02/14/2014 09:30 AM  Date Referred:  02/13/2014   Referral source  Central Nursery     Referred reason  Behavioral Health Issues   Other referral source:    I:  FAMILY / HOME ENVIRONMENT Child's legal guardian:  PARENT  Guardian - Name Guardian - Age Guardian - Address  [name],Meagan [address] [name], [place] [address]  [name], Meagan  same as above   Other household support members/support persons Other support:   Extensive family support    II  PSYCHOSOCIAL DATA Information Source:    Financial and Community Resources Employment:   [employer] recently laid off work.   Financial resources:  Medicaid If Medicaid - County:   Other  WIC  Food Stamps   School / Grade:   Maternity Care Coordinator / Child Services Coordination / Early Interventions:  Cultural issues impacting care:    III  STRENGTHS Strengths  Supportive family/friends  Home prepared for Child (including basic supplies)  Adequate Resources   Strength comment:    IV  RISK FACTORS AND CURRENT PROBLEMS Current Problem:       V  SOCIAL WORK ASSESSMENT Acknowledged order for Social Work consult to assess mother's hx of mental illness.  Parents are married and have one other almost age 2.  Mother reports hx of anxiety and panic attacks.  Informed that when she was age 16, she and 5 other girls were hanging out and tried huffing dust remover.  Informed that this was the first time she ever tried huffing anything and she was told it was similar to breathing in the helium from the balloons. Mother states that since then she suffered from panic attacks and anxiety.  Informed that she participated in the [facility] for a long period of  time, then got connected with [facility].  Informed that over the years she has learned how to manage the anxiety and panic attacks using the techniques learned.  Informed that when she was alone with her other child and knew she was about to have  panic attack, she would put the baby in a safe place, then sit down and call her father or husband and they are usually able to talk her threw it.   Mother states that she not been in therapy since Jan 2015, but can reconnect with [facility] if needed.  She denies any hx of PP Depression.  Discussed sign/symptoms of PP Depression.  Mother also states that she is being prescribed Xanax and was on .5mg and weaned to .25mg during the pregnancy.  Informed that her long term goal is to get completely off Xanax.    She communicates having adequate support from family.  Mother states that spouse was recently laid off work and will be home with her for a couple of weeks.  She did not seem concerned about him finding other employment.  She denies any hx of illicit drug use.  No acute social concerns related at this time. Mother informed of social work availability.      VI SOCIAL WORK PLAN Social Work Plan  No Further Intervention Required / No Barriers to Discharge    

## 2014-02-14 NOTE — Progress Notes (Signed)
Off shift nurse reports patient used her own Xanax earlier today. Bedside report included discussion of this event and plan to send personal medication home with her father and request Xanax from hospital staff as needed. Patient, spouse and nurse all aware of and agree to plan. Pt. requested Xanax at 1630 and achieved good relief.

## 2014-02-14 NOTE — Lactation Note (Signed)
This note was copied from the chart of Girl Montey HoraCatherine Lafoe. Lactation Consultation Note  Patient Name: Girl Montey HoraCatherine Anglin WUJWJ'XToday's Date: 02/14/2014 Reason for consult: Follow-up assessment;Other (Comment) (Painful latch, pumping for lactation induction.) Baby 36 hours of life. Mom states that nursing was too painful. Mom given comfort gels with instructions. Assisted mom to use DEBP. Mom states that she has pumped twice in past 4 hours and has only seen drops of colostrum. Enc mom to pump for 15 minutes every 3 hours. Enc mom to massage breast prior to pumping. Mom states that her milk did come in with her fist child, but that it was too painful to nurse. Mom states that she has a pump at home. Enc mom to call WIC in the morning as she is not currently active with them and needs them for both of her children. Enc mom to offer whatever EBM she obtains to baby, in addition to formula the baby is receiving, and she can call for assistance as needed. Reiterated with mom that [facility] if she has any questions or concerns regarding breastfeeding.   Maternal Data    Feeding    LATCH Score/Interventions                      Lactation Tools Discussed/Used     Consult Status Consult Status: Follow-up Date: 02/15/14 Follow-up type: In-patient    Geralynn OchsWILLIARD, Sumaya Riedesel 02/14/2014, 8:05 PM

## 2014-02-14 NOTE — Progress Notes (Signed)
Calmer today.  Social worker in to talk with patient. Bonding well with baby

## 2014-02-14 NOTE — Progress Notes (Signed)
Post Partum Day 1 Subjective: Eating, drinking, voiding, ambulating well.  +BM.  Lochia and pain wnl.  Denies lightheadedness, or sob. Some dizziness when standing. Nipples very painful w/ initial latch, so has been bottlefeeding but really wants to BF.Not ready to go home yet, maybe this evening, will let her nurse know.   Objective: Blood pressure 119/79, pulse 79, temperature 98 F (36.7 C), temperature source Oral, resp. rate 18, height 5\' 2"  (1.575 m), weight 80.74 kg (178 lb), last menstrual period 04/15/2013, SpO2 99.00%, unknown if currently breastfeeding.  Physical Exam:  General: alert, cooperative and no distress Lochia: appropriate Uterine Fundus: firm Incision: n/a DVT Evaluation: No evidence of DVT seen on physical exam. Negative Homan's sign. No cords or calf tenderness. No significant calf/ankle edema.   Recent Labs  02/12/14 2016 02/13/14 0615  HGB 13.0 12.9  HCT 38.1 38.7    Assessment/Plan: Possible d/c this pm, breastfeeding- sore nipples, [facility] to begin pumping/or nurse baby q 2-3hrs if plans to continue to breastfeed Undecided about contraception, discussed options Hgb normal-change positions slowly for dizziness   LOS: 2 days   Meagan [name], Meagan [name] 02/14/2014, 6:40 AM

## 2014-02-15 LAB — RH IG WORKUP (INCLUDES ABO/RH)
ABO/RH(D): A NEG
Antibody Screen: NEGATIVE
Fetal Screen: NEGATIVE
Gestational Age(Wks): 40.4
Unit division: 0

## 2014-02-15 MED ORDER — IBUPROFEN 600 MG PO TABS: 600.0000 mg | ORAL_TABLET | Freq: Four times a day (QID) | ORAL | Status: DC

## 2014-02-15 MED ORDER — OXYCODONE-ACETAMINOPHEN 5-325 MG PO TABS: 1.0000 | ORAL_TABLET | ORAL | Status: DC | PRN

## 2014-02-15 NOTE — Lactation Note (Signed)
This note was copied from the chart of Meagan [name] Iseman. Lactation Consultation Note  Upon entering room mother was giving baby a bottle of formula. Mother states she wants to breastfeed but at this time she is too sore and asked for another set of comfort gels.  Provided gels. Mother states she has not been pumping on a regular basis but plans to when she goes home.  Mother has her own DEBP. Encouraged her to either put baby to the breast 8-12 times a day based on cue or pump every 3 hours. Reviewed massaging breasts as she breastfeeds to empty and engorgement care.  Patient Name: Meagan [name] Adamski RUEAV'WToday's Date: 02/15/2014 Reason for consult: Follow-up assessment   Maternal Data    Feeding Feeding Type: Formula Nipple Type: Slow - flow  LATCH Score/Interventions                      Lactation Tools Discussed/Used     Consult Status Consult Status: Complete    Hardie PulleyBerkelhammer, Ruth Boschen 02/15/2014, 8:45 AM

## 2014-02-15 NOTE — Discharge Summary (Signed)
Obstetric Discharge Summary Reason for Admission: onset of labor Prenatal Procedures: ultrasound Intrapartum Procedures: spontaneous vaginal delivery Postpartum Procedures: none Complications-Operative and Postpartum: none Hemoglobin  Date Value Ref Range Status  02/13/2014 12.9  12.0 - 15.0 g/dL Final     HCT  Date Value Ref Range Status  02/13/2014 38.7  36.0 - 46.0 % Final    Physical Exam:  General: alert, cooperative, appears stated age and no distress Lochia: appropriate Uterine Fundus: firm Incision: n/a DVT Evaluation: No evidence of DVT seen on physical exam. Negative Homan's sign. No significant calf/ankle edema.  Discharge Diagnoses: Term Pregnancy-delivered  Discharge Information: Date: 02/15/2014 Activity: pelvic rest Diet: routine Medications: PNV, Ibuprofen and Percocet Condition: stable and improved Instructions: refer to practice specific booklet Discharge to: home   Newborn Data: Live born female  Birth Weight: 6 lb 12.1 oz (3065 g) APGAR: 8, 9  Home with mother.  LAWSON, MARIE DARLENE 02/15/2014, 6:30 AM

## 2014-02-15 NOTE — Progress Notes (Signed)
Ur chart review completed.  

## 2014-02-17 ENCOUNTER — Inpatient Hospital Stay (HOSPITAL_COMMUNITY): Admission: RE | Admit: 2014-02-17 | Payer: Medicaid Other | Source: Ambulatory Visit

## 2014-02-24 ENCOUNTER — Other Ambulatory Visit: Payer: Self-pay | Admitting: Obstetrics and Gynecology

## 2014-02-25 ENCOUNTER — Other Ambulatory Visit: Payer: Self-pay | Admitting: Obstetrics and Gynecology

## 2014-02-25 MED ORDER — ALPRAZOLAM 0.25 MG PO TABS: ORAL_TABLET | ORAL | Status: DC

## 2014-02-25 NOTE — Telephone Encounter (Signed)
Xanax cannot be eprescribed and I am at [facility] now.  Additionally, she should plan on getting her psychiatrist/PCP to rx her xanax

## 2014-02-25 NOTE — Telephone Encounter (Signed)
Pt wants a refill on her xanax.

## 2014-03-24 ENCOUNTER — Encounter: Payer: Self-pay | Admitting: *Deleted

## 2014-03-24 ENCOUNTER — Ambulatory Visit: Payer: Medicaid Other | Admitting: Advanced Practice Midwife

## 2014-03-29 ENCOUNTER — Other Ambulatory Visit: Payer: Self-pay | Admitting: Obstetrics & Gynecology

## 2014-03-31 ENCOUNTER — Other Ambulatory Visit: Payer: Self-pay | Admitting: Obstetrics & Gynecology

## 2014-03-31 ENCOUNTER — Telehealth: Payer: Self-pay | Admitting: Obstetrics & Gynecology

## 2014-03-31 ENCOUNTER — Telehealth: Payer: Self-pay | Admitting: *Deleted

## 2014-03-31 NOTE — Telephone Encounter (Signed)
Pt states out of xanax by 2 days and requesting refill.

## 2014-03-31 NOTE — Telephone Encounter (Signed)
Xanax is refilled

## 2014-03-31 NOTE — Telephone Encounter (Signed)
Pt called back requesting refill on her xanax 0.25.mg.

## 2014-04-01 ENCOUNTER — Ambulatory Visit (INDEPENDENT_AMBULATORY_CARE_PROVIDER_SITE_OTHER): Payer: Medicaid Other | Admitting: Obstetrics & Gynecology

## 2014-04-01 ENCOUNTER — Encounter: Payer: Self-pay | Admitting: Obstetrics & Gynecology

## 2014-04-01 VITALS — BP 104/70 | Ht 62.0 in | Wt 159.0 lb

## 2014-04-01 DIAGNOSIS — R3915 Urgency of urination: Secondary | ICD-10-CM

## 2014-04-01 DIAGNOSIS — M545 Low back pain, unspecified: Secondary | ICD-10-CM

## 2014-04-01 LAB — POCT URINALYSIS DIPSTICK
Blood, UA: 1
Glucose, UA: NEGATIVE
Ketones, UA: NEGATIVE
Leukocytes, UA: NEGATIVE
Nitrite, UA: NEGATIVE
Protein, UA: NEGATIVE

## 2014-04-01 MED ORDER — OMEPRAZOLE 20 MG PO CPDR: 20.0000 mg | DELAYED_RELEASE_CAPSULE | Freq: Every day | ORAL | Status: DC

## 2014-04-01 NOTE — Progress Notes (Signed)
Patient ID: Meagan [name], female   DOB: 04/06/1991, 23 y.o.   MRN: [id number] Pt presents 5 weeks post partum for low back pain and urinary frequency Also needs GERD medication  Blood pressure 104/70, height 5\' 2"  (1.575 m), weight 159 lb (72.122 kg), unknown if currently breastfeeding. UA negative Exam Tender paraspinous and posterior ischial spine No CVAT  Normal low back pain NSAIDs  Heat topicals  Keep pp exam

## 2014-04-03 LAB — URINE CULTURE
Colony Count: NO GROWTH
Organism ID, Bacteria: NO GROWTH

## 2014-04-06 ENCOUNTER — Encounter: Payer: Self-pay | Admitting: *Deleted

## 2014-04-06 ENCOUNTER — Ambulatory Visit: Payer: Medicaid Other | Admitting: Advanced Practice Midwife

## 2014-04-14 ENCOUNTER — Encounter: Payer: Self-pay | Admitting: Advanced Practice Midwife

## 2014-04-14 ENCOUNTER — Ambulatory Visit (INDEPENDENT_AMBULATORY_CARE_PROVIDER_SITE_OTHER): Payer: Medicaid Other | Admitting: Advanced Practice Midwife

## 2014-04-14 MED ORDER — HPV QUADRIVALENT VACCINE IM SUSP: 0.5000 mL | Freq: Once | INTRAMUSCULAR | Status: DC

## 2014-04-14 MED ORDER — NORGESTIMATE-ETH ESTRADIOL 0.25-35 MG-MCG PO TABS: 1.0000 | ORAL_TABLET | Freq: Every day | ORAL | Status: DC

## 2014-04-14 NOTE — Progress Notes (Signed)
  Montey HoraCatherine Lagan is a 23 y.o. who presents for a postpartum visit. She is 7 weeks postpartum following a spontaneous vaginal delivery. I have fully reviewed the prenatal and intrapartum course. The delivery was at 40.3 gestational weeks.  Anesthesia: epidural. Postpartum course has been uneventful. Baby's course has been uneventful. Baby is feeding by bottle. Bleeding: no bleeding. Bowel function is normal. Bladder function is normal. Patient is sexually active. Contraception method is condoms. Postpartum depression screening: negative.    Review of Systems   Constitutional: Negative for fever and chills Eyes: Negative for visual disturbances Respiratory: Negative for shortness of breath, dyspnea Cardiovascular: Negative for chest pain or palpitations  Gastrointestinal: Negative for vomiting, diarrhea and constipation Genitourinary: Negative for dysuria and urgency Musculoskeletal: Negative for back pain, joint pain, myalgias  Neurological: Negative for dizziness and headaches   Objective:     Filed Vitals:   04/14/14 1044  BP: 120/80   General:  alert, cooperative and no distress   Breasts:  negative  Lungs: clear to auscultation bilaterally  Heart:  regular rate and rhythm  Abdomen: Soft, nontender   Vulva:  normal  Vagina: normal vagina  Cervix:  closed  Corpus: Well involuted     Rectal Exam: no hemorrhoids        Assessment:    normal postpartum exam.  Plan:    1. Contraception: OCP (estrogen/progesterone) 2. Follow up in: this week for Gardisil 9 (rx sent to pharmacy)  or as needed.

## 2014-04-23 ENCOUNTER — Ambulatory Visit: Payer: Medicaid Other

## 2014-05-03 ENCOUNTER — Encounter: Payer: Self-pay | Admitting: Advanced Practice Midwife

## 2014-06-02 ENCOUNTER — Telehealth: Payer: Self-pay | Admitting: Obstetrics & Gynecology

## 2014-06-02 NOTE — Telephone Encounter (Signed)
Pt c/o pain in hip area, rated at 6 on 1-10 scale. Pt states this pain is related to her vaginal delivery in Aug of this year and that Dr. Emelda FearFerguson had prescribed hydrocodone in the past. Pt informed will need to make an appt for evaluation of pain and Dr. Emelda FearFerguson was out of the office until Monday. Call transferred to front staff for the first available appt with Dr. Emelda FearFerguson.

## 2014-07-02 DIAGNOSIS — G43411 Hemiplegic migraine, intractable, with status migrainosus: Secondary | ICD-10-CM

## 2014-07-02 HISTORY — DX: Hemiplegic migraine, intractable, with status migrainosus: G43.411

## 2014-07-04 ENCOUNTER — Emergency Department (INDEPENDENT_AMBULATORY_CARE_PROVIDER_SITE_OTHER)
Admission: EM | Admit: 2014-07-04 | Discharge: 2014-07-04 | Disposition: A | Discharge status: Home / Self Care | Payer: Medicaid Other | Source: Home / Self Care | Attending: Emergency Medicine | Admitting: Emergency Medicine

## 2014-07-04 ENCOUNTER — Encounter (HOSPITAL_COMMUNITY): Payer: Self-pay | Admitting: *Deleted

## 2014-07-04 DIAGNOSIS — J069 Acute upper respiratory infection, unspecified: Secondary | ICD-10-CM

## 2014-07-04 LAB — POCT RAPID STREP A: Streptococcus, Group A Screen (Direct): NEGATIVE

## 2014-07-04 MED ORDER — TRAMADOL HCL 50 MG PO TABS: ORAL_TABLET | ORAL | Status: DC

## 2014-07-04 MED ORDER — IPRATROPIUM BROMIDE 0.06 % NA SOLN: 2.0000 | Freq: Four times a day (QID) | NASAL | Status: DC

## 2014-07-04 NOTE — ED Provider Notes (Signed)
   Chief Complaint   Sore Throat   History of Present Illness   Meagan [name] is a 24 year old female who has had a three-day history of nasal congestion with green rhinorrhea, headache, sinus pressure, watery eyes, ear congestion, sore throat, cough productive green sputum, aching in her upper back, dizziness, abdominal pain, and diarrhea.  Review of Systems   Other than as noted above, the patient denies any of the following symptoms: Systemic:  No fevers, chills, sweats, or myalgias. Eye:  No redness or discharge. ENT:  No ear pain, headache, nasal congestion, drainage, sinus pressure, or sore throat. Neck:  No neck pain, stiffness, or swollen glands. Lungs:  No cough, sputum production, hemoptysis, wheezing, chest tightness, shortness of breath or chest pain. GI:  No abdominal pain, nausea, vomiting or diarrhea.  PMFSH   Past medical history, family history, social history, meds, and allergies were reviewed. She is allergic to penicillin and codeine. Her only medication is Xanax. She has asthma, anxiety, and migraine headaches.  Physical exam   Vital signs:  BP 117/59 mmHg  Pulse 103  Temp(Src) 97.7 F (36.5 C) (Oral)  Resp 16  SpO2 97% General:  Alert and oriented.  In no distress.  Skin warm and dry. Eye:  No conjunctival injection or drainage. Lids were normal. ENT:  TMs and canals were normal, without erythema or inflammation.  Nasal mucosa was clear and uncongested, without drainage.  Mucous membranes were moist.  Pharynx was clear with no exudate or drainage.  There were no oral ulcerations or lesions. Neck:  Supple, no adenopathy, tenderness or mass. Lungs:  No respiratory distress.  Lungs were clear to auscultation, without wheezes, rales or rhonchi.  Breath sounds were clear and equal bilaterally.  Heart:  Regular rhythm, without gallops, murmers or rubs. Skin:  Clear, warm, and dry, without rash or lesions.  Labs   Results for orders placed or performed  during the hospital encounter of 07/04/14  POCT rapid strep A [facility])  Result Value Ref Range   Streptococcus, Group A Screen (Direct) NEGATIVE NEGATIVE    Assessment     The encounter diagnosis was Viral URI.  There is no evidence of pneumonia, strep throat, sinusitis, otitis media.    Plan    1.  Meds:  The following meds were prescribed:   Discharge Medication List as of 07/04/2014  3:33 PM    START taking these medications   Details  ipratropium (ATROVENT) 0.06 % nasal spray Place 2 sprays into both nostrils 4 (four) times daily., Starting 07/04/2014, Until Discontinued, Normal    traMADol (ULTRAM) 50 MG tablet 1 to 2 tablets every 8 hours as needed for cough, Print        2.  Patient Education/Counseling:  The patient was given appropriate handouts, self care instructions, and instructed in symptomatic relief.  Instructed to get extra fluids and extra rest.    3.  Follow up:  The patient was told to follow up here if no better in 3 to 4 days, or sooner if becoming worse in any way, and given some red flag symptoms such as increasing fever, difficulty breathing, chest pain, or persistent vomiting which would prompt immediate return.       Reuben Likes, MD 07/04/14 2028

## 2014-07-04 NOTE — Discharge Instructions (Signed)

## 2014-07-04 NOTE — ED Notes (Signed)
Cough   Congested   As  Well  As  Drainage  And  A  sorethroat       Symptoms  X  sev  Days   Child  Is  Ill  As   Well  With  Similar  Symptoms

## 2014-07-06 LAB — CULTURE, GROUP A STREP

## 2014-07-26 ENCOUNTER — Other Ambulatory Visit: Payer: Self-pay | Admitting: Obstetrics & Gynecology

## 2014-09-02 ENCOUNTER — Telehealth: Payer: Self-pay | Admitting: Obstetrics & Gynecology

## 2014-09-02 MED ORDER — NICOTINE 21 MG/24HR TD PT24: 21.0000 mg | MEDICATED_PATCH | Freq: Every day | TRANSDERMAL | Status: DC

## 2014-09-02 NOTE — Telephone Encounter (Signed)
nicoderm cq 21 mg e prescribed

## 2014-09-02 NOTE — Telephone Encounter (Signed)
Pt informed Nicoderm patch e-scribed to [facility].

## 2014-09-27 ENCOUNTER — Other Ambulatory Visit: Payer: Self-pay | Admitting: Obstetrics & Gynecology

## 2015-01-11 ENCOUNTER — Emergency Department (HOSPITAL_COMMUNITY)
Admission: EM | Admit: 2015-01-11 | Discharge: 2015-01-11 | Discharge status: Against Medical Advice | Payer: Medicaid Other | Attending: Emergency Medicine | Admitting: Emergency Medicine

## 2015-01-11 ENCOUNTER — Encounter (HOSPITAL_COMMUNITY): Payer: Self-pay | Admitting: Emergency Medicine

## 2015-01-11 DIAGNOSIS — R51 Headache: Secondary | ICD-10-CM | POA: Insufficient documentation

## 2015-01-11 DIAGNOSIS — R200 Anesthesia of skin: Secondary | ICD-10-CM | POA: Insufficient documentation

## 2015-01-11 DIAGNOSIS — H538 Other visual disturbances: Secondary | ICD-10-CM | POA: Diagnosis not present

## 2015-01-11 DIAGNOSIS — Z720 Tobacco use: Secondary | ICD-10-CM | POA: Insufficient documentation

## 2015-01-11 DIAGNOSIS — J45909 Unspecified asthma, uncomplicated: Secondary | ICD-10-CM | POA: Diagnosis not present

## 2015-01-11 HISTORY — DX: Headache: R51

## 2015-01-11 HISTORY — DX: Hemiplegic migraine, not intractable, without status migrainosus: G43.409

## 2015-01-11 HISTORY — DX: Headache, unspecified: R51.9

## 2015-01-11 NOTE — ED Notes (Signed)
Had headache two days ago.  Was given given Topamax yesterday for  Headache by PCP.  Blurred vision, numbness to tongue, left arm nose and mouth and teeth, per pt.

## 2015-01-11 NOTE — ED Notes (Signed)
Called back for room.  Pt was seen leaving in car.

## 2015-01-12 ENCOUNTER — Other Ambulatory Visit: Payer: Self-pay | Admitting: Obstetrics & Gynecology

## 2015-01-13 ENCOUNTER — Other Ambulatory Visit: Payer: Self-pay | Admitting: Obstetrics & Gynecology

## 2015-01-13 ENCOUNTER — Telehealth: Payer: Self-pay | Admitting: Obstetrics & Gynecology

## 2015-01-13 NOTE — Telephone Encounter (Signed)
I refilled this request yesterday see med list

## 2015-01-13 NOTE — Telephone Encounter (Signed)
Pt states [facility] has not received RX for Xanax. Called [facility] they did get the RX for Xanax and stated pt would be able to pick up tomorrow. Pt informed.

## 2015-01-18 ENCOUNTER — Encounter: Payer: Self-pay | Admitting: Neurology

## 2015-01-18 ENCOUNTER — Ambulatory Visit (INDEPENDENT_AMBULATORY_CARE_PROVIDER_SITE_OTHER): Payer: Medicaid Other | Admitting: Neurology

## 2015-01-18 VITALS — BP 124/83 | HR 94 | Ht 62.0 in | Wt 160.0 lb

## 2015-01-18 DIAGNOSIS — G43409 Hemiplegic migraine, not intractable, without status migrainosus: Secondary | ICD-10-CM

## 2015-01-18 MED ORDER — NORTRIPTYLINE HCL 10 MG PO CAPS: 10.0000 mg | ORAL_CAPSULE | Freq: Every day | ORAL | Status: DC

## 2015-01-18 NOTE — Patient Instructions (Signed)
Overall you are doing fairly well but I do want to suggest a few things today:   Remember to drink plenty of fluid, eat healthy meals and do not skip any meals. Try to eat protein with a every meal and eat a healthy snack such as fruit or nuts in between meals. Try to keep a regular sleep-wake schedule and try to exercise daily, particularly in the form of walking, 20-30 minutes a day, if you can.   As far as your medications are concerned, I would like to suggest; Nortriptyline 10mg  (one tab) at bedtime  I would like to see you back in 4-6 months, sooner if we need to. Please call us with any interim questions, concerns, problems, updates or refill requests.   Please also call us for any test results so we can go over those with you on the phone.  My clinical assistant and will answer any of your questions and relay your messages to me and also relay most of my messages to you.   Our phone number is [phone]. We also have an after hours call service for urgent matters and there is a physician on-call for urgent questions. For any emergencies you know to call 911 or go to the nearest emergency room
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
## 2015-01-19 ENCOUNTER — Telehealth: Payer: Self-pay | Admitting: Neurology

## 2015-01-19 NOTE — Telephone Encounter (Signed)
Patient is calling. She needs to discuss nortriptyline (PAMELOR) 10 MG capsule which she was given yesterday for a migraine. The patient states this medication is for depression. Please call and discuss. Thank you.

## 2015-01-19 NOTE — Telephone Encounter (Signed)
I called back.  Explained this medication has multiple uses.  Patient verbalized understanding and will try this med.  She will call us back if anything further is needed.

## 2015-01-22 ENCOUNTER — Encounter: Payer: Self-pay | Admitting: Neurology

## 2015-01-22 DIAGNOSIS — G43409 Hemiplegic migraine, not intractable, without status migrainosus: Secondary | ICD-10-CM | POA: Insufficient documentation

## 2015-02-07 ENCOUNTER — Telehealth: Payer: Self-pay | Admitting: Advanced Practice Midwife

## 2015-02-07 ENCOUNTER — Telehealth: Payer: Self-pay | Admitting: Obstetrics & Gynecology

## 2015-02-07 NOTE — Telephone Encounter (Signed)
Pt states she is having to take 2 1/2 tablets of the Xanax daily. Pt went to get her refill from the pharmacy and they told her she was 8 days early. Please advise.   Pt aware Dr. Despina Hidden out of office until tomorrow.

## 2015-02-07 NOTE — Telephone Encounter (Signed)
Duplicate message. 

## 2015-02-08 ENCOUNTER — Telehealth: Payer: Self-pay | Admitting: Obstetrics and Gynecology

## 2015-02-08 ENCOUNTER — Telehealth: Payer: Self-pay | Admitting: Obstetrics & Gynecology

## 2015-02-19 NOTE — Telephone Encounter (Signed)
noted 

## 2015-05-20 ENCOUNTER — Other Ambulatory Visit: Payer: Self-pay | Admitting: Obstetrics & Gynecology

## 2015-06-01 ENCOUNTER — Ambulatory Visit: Payer: Medicaid Other | Admitting: Neurology

## 2015-06-02 ENCOUNTER — Ambulatory Visit: Payer: Medicaid Other | Admitting: Neurology

## 2015-06-05 ENCOUNTER — Emergency Department (HOSPITAL_COMMUNITY)
Admission: EM | Admit: 2015-06-05 | Discharge: 2015-06-05 | Disposition: A | Discharge status: Home / Self Care | Payer: Medicaid Other | Attending: Emergency Medicine | Admitting: Emergency Medicine

## 2015-06-05 ENCOUNTER — Encounter (HOSPITAL_COMMUNITY): Payer: Self-pay | Admitting: Emergency Medicine

## 2015-06-05 DIAGNOSIS — N3091 Cystitis, unspecified with hematuria: Secondary | ICD-10-CM | POA: Diagnosis not present

## 2015-06-05 DIAGNOSIS — Z87442 Personal history of urinary calculi: Secondary | ICD-10-CM | POA: Insufficient documentation

## 2015-06-05 DIAGNOSIS — J45909 Unspecified asthma, uncomplicated: Secondary | ICD-10-CM | POA: Insufficient documentation

## 2015-06-05 DIAGNOSIS — Z880 Allergy status to penicillin: Secondary | ICD-10-CM | POA: Diagnosis not present

## 2015-06-05 DIAGNOSIS — Z79899 Other long term (current) drug therapy: Secondary | ICD-10-CM | POA: Insufficient documentation

## 2015-06-05 DIAGNOSIS — R350 Frequency of micturition: Secondary | ICD-10-CM | POA: Diagnosis present

## 2015-06-05 DIAGNOSIS — Z8719 Personal history of other diseases of the digestive system: Secondary | ICD-10-CM | POA: Diagnosis not present

## 2015-06-05 DIAGNOSIS — F17210 Nicotine dependence, cigarettes, uncomplicated: Secondary | ICD-10-CM | POA: Insufficient documentation

## 2015-06-05 DIAGNOSIS — Z3202 Encounter for pregnancy test, result negative: Secondary | ICD-10-CM | POA: Insufficient documentation

## 2015-06-05 DIAGNOSIS — F419 Anxiety disorder, unspecified: Secondary | ICD-10-CM | POA: Insufficient documentation

## 2015-06-05 DIAGNOSIS — Z8679 Personal history of other diseases of the circulatory system: Secondary | ICD-10-CM | POA: Insufficient documentation

## 2015-06-05 DIAGNOSIS — N939 Abnormal uterine and vaginal bleeding, unspecified: Secondary | ICD-10-CM

## 2015-06-05 LAB — CBC
HCT: 42.5 % (ref 36.0–46.0)
Hemoglobin: 13.8 g/dL (ref 12.0–15.0)
MCH: 29.9 pg (ref 26.0–34.0)
MCHC: 32.5 g/dL (ref 30.0–36.0)
MCV: 92 fL (ref 78.0–100.0)
Platelets: 229 10*3/uL (ref 150–400)
RBC: 4.62 MIL/uL (ref 3.87–5.11)
RDW: 13.5 % (ref 11.5–15.5)
WBC: 9.7 10*3/uL (ref 4.0–10.5)

## 2015-06-05 LAB — COMPREHENSIVE METABOLIC PANEL
ALT: 14 U/L (ref 14–54)
AST: 11 U/L — ABNORMAL LOW (ref 15–41)
Albumin: 3.8 g/dL (ref 3.5–5.0)
Alkaline Phosphatase: 65 U/L (ref 38–126)
Anion gap: 8 (ref 5–15)
BUN: 9 mg/dL (ref 6–20)
CO2: 25 mmol/L (ref 22–32)
Calcium: 9.2 mg/dL (ref 8.9–10.3)
Chloride: 106 mmol/L (ref 101–111)
Creatinine, Ser: 0.79 mg/dL (ref 0.44–1.00)
GFR calc Af Amer: >60 mL/min (ref >60)
GFR calc non Af Amer: >60 mL/min (ref >60)
Glucose, Bld: 100 mg/dL — ABNORMAL HIGH (ref 65–99)
Potassium: 4 mmol/L (ref 3.5–5.1)
Sodium: 139 mmol/L (ref 135–145)
Total Bilirubin: 0.3 mg/dL (ref 0.3–1.2)
Total Protein: 6.2 g/dL — ABNORMAL LOW (ref 6.5–8.1)

## 2015-06-05 LAB — URINALYSIS, ROUTINE W REFLEX MICROSCOPIC
Bilirubin Urine: NEGATIVE
Glucose, UA: NEGATIVE mg/dL
Ketones, ur: NEGATIVE mg/dL
Nitrite: NEGATIVE
Protein, ur: NEGATIVE mg/dL
Specific Gravity, Urine: 1.017 (ref 1.005–1.030)
pH: 5.5 (ref 5.0–8.0)

## 2015-06-05 LAB — URINE MICROSCOPIC-ADD ON

## 2015-06-05 LAB — WET PREP, GENITAL
Clue Cells Wet Prep HPF POC: NONE SEEN
Sperm: NONE SEEN
Trich, Wet Prep: NONE SEEN
Yeast Wet Prep HPF POC: NONE SEEN

## 2015-06-05 LAB — LIPASE, BLOOD: Lipase: 25 U/L (ref 11–51)

## 2015-06-05 LAB — POC URINE PREG, ED: Preg Test, Ur: NEGATIVE

## 2015-06-05 MED ORDER — CEPHALEXIN 500 MG PO CAPS: 500.0000 mg | ORAL_CAPSULE | Freq: Four times a day (QID) | ORAL | Status: DC

## 2015-06-05 NOTE — ED Notes (Signed)
Pt stable, ambulatory, states understanding of discharge instructions 

## 2015-06-05 NOTE — ED Notes (Signed)
C/o lower abd pain, small amount of vaginal bleeding, white vaginal discharge, and "blood clots" in her urine x 2 days.

## 2015-06-05 NOTE — Discharge Instructions (Signed)
Urinary Tract Infection °Urinary tract infections (UTIs) can develop anywhere along your urinary tract. Your urinary tract is your body's drainage system for removing wastes and extra water. Your urinary tract includes two kidneys, two ureters, a bladder, and a urethra. Your kidneys are a pair of bean-shaped organs. Each kidney is about the size of your fist. They are located below your ribs, one on each side of your spine. °CAUSES °Infections are caused by microbes, which are microscopic organisms, including fungi, viruses, and bacteria. These organisms are so small that they can only be seen through a microscope. Bacteria are the microbes that most commonly cause UTIs. °SYMPTOMS  °Symptoms of UTIs may vary by age and gender of the patient and by the location of the infection. Symptoms in young women typically include a frequent and intense urge to urinate and a painful, burning feeling in the bladder or urethra during urination. Older women and men are more likely to be tired, shaky, and weak and have muscle aches and abdominal pain. A fever may mean the infection is in your kidneys. Other symptoms of a kidney infection include pain in your back or sides below the ribs, nausea, and vomiting. °DIAGNOSIS °To diagnose a UTI, your caregiver will ask you about your symptoms. Your caregiver will also ask you to provide a urine sample. The urine sample will be tested for bacteria and white blood cells. White blood cells are made by your body to help fight infection. °TREATMENT  °Typically, UTIs can be treated with medication. Because most UTIs are caused by a bacterial infection, they usually can be treated with the use of antibiotics. The choice of antibiotic and length of treatment depend on your symptoms and the type of bacteria causing your infection. °HOME CARE INSTRUCTIONS °· If you were prescribed antibiotics, take them exactly as your caregiver instructs you. Finish the medication even if you feel better after  you have only taken some of the medication. °· Drink enough water and fluids to keep your urine clear or pale yellow. °· Avoid caffeine, tea, and carbonated beverages. They tend to irritate your bladder. °· Empty your bladder often. Avoid holding urine for long periods of time. °· Empty your bladder before and after sexual intercourse. °· After a bowel movement, women should cleanse from front to back. Use each tissue only once. °SEEK MEDICAL CARE IF:  °· You have back pain. °· You develop a fever. °· Your symptoms do not begin to resolve within 3 days. °SEEK IMMEDIATE MEDICAL CARE IF:  °· You have severe back pain or lower abdominal pain. °· You develop chills. °· You have nausea or vomiting. °· You have continued burning or discomfort with urination. °MAKE SURE YOU:  °· Understand these instructions. °· Will watch your condition. °· Will get help right away if you are not doing well or get worse. °  °This information is not intended to replace advice given to you by your health care provider. Make sure you discuss any questions you have with your health care provider. °  °Document Released: 03/28/2005 Document Revised: 03/09/2015 Document Reviewed: 07/27/2011 °Elsevier Interactive Patient Education ©2016 [employer] °Abnormal Uterine Bleeding °Abnormal uterine bleeding can affect women at various stages in life, including teenagers, women in their reproductive years, pregnant women, and women who have reached menopause. Several kinds of uterine bleeding are considered abnormal, including: °· Bleeding or spotting between periods.   °· Bleeding after sexual intercourse.   °· Bleeding that is heavier or more than normal.   °·   Periods that last longer than usual. °· Bleeding after menopause.   °Many cases of abnormal uterine bleeding are minor and simple to treat, while others are more serious. Any type of abnormal bleeding should be evaluated by your health care provider. Treatment will depend on the cause of the  bleeding. °HOME CARE INSTRUCTIONS °Monitor your condition for any changes. The following actions may help to alleviate any discomfort you are experiencing: °· Avoid the use of tampons and douches as directed by your health care provider. °· Change your pads frequently. °You should get regular pelvic exams and Pap tests. Keep all follow-up appointments for diagnostic tests as directed by your health care provider.  °SEEK MEDICAL CARE IF:  °· Your bleeding lasts more than 1 week.   °· You feel dizzy at times.   °SEEK IMMEDIATE MEDICAL CARE IF:  °· You pass out.   °· You are changing pads every 15 to 30 minutes.   °· You have abdominal pain. °· You have a fever.   °· You become sweaty or weak.   °· You are passing large blood clots from the vagina.   °· You start to feel nauseous and vomit. °MAKE SURE YOU:  °· Understand these instructions. °· Will watch your condition. °· Will get help right away if you are not doing well or get worse. °  °This information is not intended to replace advice given to you by your health care provider. Make sure you discuss any questions you have with your health care provider. °  °Document Released: 06/18/2005 Document Revised: 06/23/2013 Document Reviewed: 01/15/2013 °Elsevier Interactive Patient Education ©2016 Elsevier Inc. ° °

## 2015-06-05 NOTE — ED Provider Notes (Signed)
CSN: [id number]     Arrival date & time 06/05/15  1821 History   First MD Initiated Contact with Patient 06/05/15 1842     Chief Complaint  Patient presents with  . Abdominal Pain     (Consider location/radiation/quality/duration/timing/severity/associated sxs/prior Treatment) HPI   Meagan PellegriniCatherine P Haning is a 24 y.o. female who presents for evaluation of urinary frequency and dysuria with hematuria and possible vaginal bleeding, all onset today. She complains of general malaise. Her last period was 6 days ago. She denies fever or vomiting. She has had mild nausea. There are no other no modifying factors.  Past Medical History  Diagnosis Date  . Anxiety   . Mental disorder     huffed inhalants "affected brain"  . Stomach ulcer   . Diarrhea 06/04/2013  . Sore throat 06/04/2013  . Nausea 06/04/2013  . Pregnant 06/04/2013  . Kidney stones   . Pyelonephritis complicating pregnancy, antepartum, first trimester 11/05/2011  . Asthma     inhaler at bedside  . Hemiplegic migraine     blurred vision, nausea, dizziness, numbness  . Daily headache    Past Surgical History  Procedure Laterality Date  . No past surgery     Family History  Problem Relation Age of Onset  . Cancer Maternal Grandmother     breast  . Cancer Paternal Grandfather     lung cancer  . Hypertension Other   . Migraines Father    Social History  Substance Use Topics  . Smoking status: Current Every Day Smoker -- 1.00 packs/day for 7 years    Types: Cigarettes  . Smokeless tobacco: Never Used  . Alcohol Use: 0.0 oz/week    0 Standard drinks or equivalent per week     Comment: Occasionally   OB History    Gravida Para Term Preterm AB TAB SAB Ectopic Multiple Living   3 2 2  0 1 0 1 0 0 2     Review of Systems  All other systems reviewed and are negative.     Allergies  Codeine and Penicillins  Home Medications   Prior to Admission medications   Medication Sig Start Date End Date Taking? Authorizing  Provider  acetaminophen (TYLENOL) 500 MG tablet Take 1,000 mg by mouth every 6 (six) hours as needed for mild pain.    Yes Historical Provider, MD  albuterol (PROVENTIL HFA;VENTOLIN HFA) 108 (90 BASE) MCG/ACT inhaler Inhale 2 puffs into the lungs every 6 (six) hours as needed for wheezing or shortness of breath. 08/11/13  Yes Jacklyn ShellFrances Cresenzo-Dishmon, CNM  ALPRAZolam Prudy Feeler(XANAX) 0.25 MG tablet TAKE ONE TABLET BY MOUTH TWICE DAILY AS NEEDED. 01/12/15  Yes Lazaro ArmsLuther H Eure, MD  ibuprofen (ADVIL,MOTRIN) 600 MG tablet Take 1 tablet (600 mg total) by mouth every 6 (six) hours. 02/15/14  Yes Montez MoritaMarie D Lawson, CNM  nortriptyline (PAMELOR) 10 MG capsule Take 1 capsule (10 mg total) by mouth at bedtime. 01/18/15  Yes Anson FretAntonia B Ahern, MD  omeprazole (PRILOSEC) 20 MG capsule TAKE ONE CAPSULE BY MOUTH DAILY. 05/23/15  Yes Lazaro ArmsLuther H Eure, MD  cephALEXin (KEFLEX) 500 MG capsule Take 1 capsule (500 mg total) by mouth 4 (four) times daily. 06/05/15   Mancel BaleElliott Alitzel Cookson, MD   BP 118/66 mmHg  Pulse 85  Temp(Src) 98.2 F (36.8 C) (Oral)  Resp 18  Ht 5\' 2"  (1.575 m)  Wt 160 lb (72.576 kg)  BMI 29.26 kg/m2  SpO2 96%  LMP 05/25/2015 Physical Exam  Constitutional: She is oriented to person,  place, and time. She appears well-developed and well-nourished.  HENT:  Head: Normocephalic and atraumatic.  Right Ear: External ear normal.  Left Ear: External ear normal.  Eyes: Conjunctivae and EOM are normal. Pupils are equal, round, and reactive to light.  Neck: Normal range of motion and phonation normal. Neck supple.  Cardiovascular: Normal rate, regular rhythm and normal heart sounds.   Pulmonary/Chest: Effort normal and breath sounds normal. She exhibits no bony tenderness.  Abdominal: Soft. There is no tenderness.  Genitourinary:  Normal external female genitalia. Small amount of opaque vaginal discharge. Cervical ectropion is present, with a very small amount of bleeding at the 5:00 position on the external os. On bimanual  examination, there is no uterine enlargement, adnexal tenderness or adnexal mass.  Musculoskeletal: Normal range of motion.  Neurological: She is alert and oriented to person, place, and time. No cranial nerve deficit or sensory deficit. She exhibits normal muscle tone. Coordination normal.  Skin: Skin is warm, dry and intact.  Psychiatric: She has a normal mood and affect. Her behavior is normal. Judgment and thought content normal.  Nursing note and vitals reviewed.   ED Course  Procedures (including critical care time)  Medications - No data to display  Patient Vitals for the past 24 hrs:  BP Temp Temp src Pulse Resp SpO2  06/05/15 2126 118/66 mmHg 98.2 F (36.8 C) Oral 85 18 96 %    At D/C- Reevaluation with update and discussion. After initial assessment and treatment, an updated evaluation reveals no change in status, findings discussed and questions answered. Tamari Busic L    Labs Review Labs Reviewed  WET PREP, GENITAL - Abnormal; Notable for the following:    WBC, Wet Prep HPF POC FEW (*)    All other components within normal limits  COMPREHENSIVE METABOLIC PANEL - Abnormal; Notable for the following:    Glucose, Bld 100 (*)    Total Protein 6.2 (*)    AST 11 (*)    All other components within normal limits  URINALYSIS, ROUTINE W REFLEX MICROSCOPIC (NOT AT [facility]) - Abnormal; Notable for the following:    APPearance CLOUDY (*)    Hgb urine dipstick LARGE (*)    Leukocytes, UA MODERATE (*)    All other components within normal limits  URINE MICROSCOPIC-ADD ON - Abnormal; Notable for the following:    Squamous Epithelial / LPF 0-5 (*)    Bacteria, UA FEW (*)    All other components within normal limits  LIPASE, BLOOD  CBC  RPR  HIV ANTIBODY (ROUTINE TESTING)  POC URINE PREG, ED  GC/CHLAMYDIA PROBE AMP ([place]) NOT AT [facility]    Imaging Review No results found. I have personally reviewed and evaluated these images and lab results as part of my medical  decision-making.   EKG Interpretation None      MDM   Final diagnoses:  Hemorrhagic cystitis  Vaginal bleeding   Uncomplicated UTI. Vaginal bleeding from cervix, suspect abrasion. Cervical ectropion noted, will refer to GYN  Nursing Notes Reviewed/ Care Coordinated Applicable Imaging Reviewed Interpretation of Laboratory Data incorporated into ED treatment  The patient appears reasonably screened and/or stabilized for discharge and I doubt any other medical condition or other [facility] requiring further screening, evaluation, or treatment in the ED at this time prior to discharge.  Plan: Home Medications- Keflex; Home Treatments- rest; return here if the recommended treatment, does not improve the symptoms; Recommended follow up- GYN 1-2 weeks     Mancel Bale, MD  06/06/15 2046 

## 2015-06-06 LAB — GC/CHLAMYDIA PROBE AMP (~~LOC~~) NOT AT ARMC
Chlamydia: NEGATIVE
Neisseria Gonorrhea: NEGATIVE

## 2015-06-06 LAB — RPR: RPR Ser Ql: NONREACTIVE

## 2015-06-06 LAB — HIV ANTIBODY (ROUTINE TESTING W REFLEX): HIV Screen 4th Generation wRfx: NONREACTIVE

## 2015-06-22 ENCOUNTER — Ambulatory Visit: Payer: Self-pay | Admitting: Neurology

## 2015-07-03 NOTE — L&D Delivery Note (Signed)
25 y.o. G3P2002 at 6922w0d delivered a viable  Female infant in cephalic, OA position. Nuchal cordx1, easily reduced. Anterior shoulder delivered with ease. 60 sec delayed cord clamping. Cord clamped x2 and cut. Placenta delivered spontaneously intact, with 3VC. Fundus firm on exam with massage and pitocin. Good hemostasis noted.  Laceration: None Suture: N/A Good hemostasis noted. EBL: 200 cc  Mom and baby recovering in LDR.   Skin to skin, couplet care  Apgars:9,9 Weight:pending   Freddrick MarchYashika Amin, MD PGY-1 06/18/2016, 2:26 PM  OB FELLOW DELIVERY ATTESTATION  I was gloved and present for the delivery in its entirety, and I agree with the above resident's note.    Ernestina PennaNicholas Bryson Gavia, MD 3:25 PM

## 2015-07-06 ENCOUNTER — Ambulatory Visit: Payer: Medicaid Other | Admitting: Neurology

## 2015-07-19 ENCOUNTER — Ambulatory Visit: Payer: Medicaid Other | Admitting: Neurology

## 2015-07-19 ENCOUNTER — Telehealth: Payer: Self-pay | Admitting: *Deleted

## 2015-07-19 NOTE — Telephone Encounter (Signed)
no showed f/u appt 

## 2015-07-20 ENCOUNTER — Encounter: Payer: Self-pay | Admitting: Neurology

## 2015-08-08 ENCOUNTER — Other Ambulatory Visit: Payer: Self-pay | Admitting: Neurology

## 2015-08-10 ENCOUNTER — Encounter (HOSPITAL_COMMUNITY): Payer: Self-pay | Admitting: Family Medicine

## 2015-08-10 ENCOUNTER — Emergency Department (HOSPITAL_COMMUNITY)
Admission: EM | Admit: 2015-08-10 | Discharge: 2015-08-10 | Disposition: A | Discharge status: Home / Self Care | Payer: Medicaid Other | Attending: Emergency Medicine | Admitting: Emergency Medicine

## 2015-08-10 ENCOUNTER — Telehealth: Payer: Self-pay | Admitting: *Deleted

## 2015-08-10 DIAGNOSIS — G43009 Migraine without aura, not intractable, without status migrainosus: Secondary | ICD-10-CM

## 2015-08-10 DIAGNOSIS — G43909 Migraine, unspecified, not intractable, without status migrainosus: Secondary | ICD-10-CM | POA: Diagnosis not present

## 2015-08-10 DIAGNOSIS — Z880 Allergy status to penicillin: Secondary | ICD-10-CM | POA: Diagnosis not present

## 2015-08-10 DIAGNOSIS — Z87448 Personal history of other diseases of urinary system: Secondary | ICD-10-CM | POA: Diagnosis not present

## 2015-08-10 DIAGNOSIS — F17210 Nicotine dependence, cigarettes, uncomplicated: Secondary | ICD-10-CM | POA: Diagnosis not present

## 2015-08-10 DIAGNOSIS — J45909 Unspecified asthma, uncomplicated: Secondary | ICD-10-CM | POA: Insufficient documentation

## 2015-08-10 DIAGNOSIS — R202 Paresthesia of skin: Secondary | ICD-10-CM | POA: Insufficient documentation

## 2015-08-10 DIAGNOSIS — F419 Anxiety disorder, unspecified: Secondary | ICD-10-CM | POA: Insufficient documentation

## 2015-08-10 DIAGNOSIS — Z79899 Other long term (current) drug therapy: Secondary | ICD-10-CM | POA: Insufficient documentation

## 2015-08-10 DIAGNOSIS — Z87442 Personal history of urinary calculi: Secondary | ICD-10-CM | POA: Insufficient documentation

## 2015-08-10 DIAGNOSIS — H543 Unqualified visual loss, both eyes: Secondary | ICD-10-CM | POA: Diagnosis present

## 2015-08-10 MED ORDER — METOCLOPRAMIDE HCL 5 MG/ML IJ SOLN: 10.0000 mg | Freq: Once | INTRAMUSCULAR | Status: DC

## 2015-08-10 MED ORDER — METOCLOPRAMIDE HCL 10 MG PO TABS
10.0000 mg | ORAL_TABLET | Freq: Once | ORAL | Status: AC
  Administered 2015-08-10: 10 mg via ORAL
  Filled 2015-08-10: qty 1

## 2015-08-10 NOTE — Telephone Encounter (Signed)
Patient has cancelled 2 appointments and no-showed the last one. She needs to be compliant with her appointments. Patient was informed thanks

## 2015-08-10 NOTE — Telephone Encounter (Signed)
[facility] called office and stated pt having severe migraine in office. Is crying and has numbness back of head. They r/o stroke sx. She stated she is in "10/10" pain. Advised per Dr Lucia Gaskins to go to ER and call to make f/u appt after she is seen there. They will tell her while she is in the office.

## 2015-08-10 NOTE — ED Provider Notes (Signed)
CSN: [id number]     Arrival date & time 08/10/15  1152 History   First MD Initiated Contact with Patient 08/10/15 1416     Chief Complaint  Patient presents with  . Headache  . Loss of Vision  . Emesis     (Consider location/radiation/quality/duration/timing/severity/associated sxs/prior Treatment) HPI Plan gradual onset headache 10 PM today while at work accompanied by partial loss of vision bilaterally which has since resolved. Other associated symptoms include tingling in her left arm and the left side of her face and the tip of her nose. 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 No treatment prior to coming here. No other associated symptoms. Pain is moderate at present. Sharp and throbbing in nature and located at right temporal area patient had MRI scan of brain 2015 which was normal. Past Medical History  Diagnosis Date  . Anxiety   . Mental disorder     huffed inhalants "affected brain"  . Stomach ulcer   . Diarrhea 06/04/2013  . Sore throat 06/04/2013  . Nausea 06/04/2013  . Pregnant 06/04/2013  . Kidney stones   . Pyelonephritis complicating pregnancy, antepartum, first trimester 11/05/2011  . Asthma     inhaler at bedside  . Hemiplegic migraine     blurred vision, nausea, dizziness, numbness  . Daily headache    Past Surgical History  Procedure Laterality Date  . No past surgery     Family History  Problem Relation Age of Onset  . Cancer Maternal Grandmother     breast  . Cancer Paternal Grandfather     lung cancer  . Hypertension Other   . Migraines Father    Social History  Substance Use Topics  . Smoking status: Current Every Day Smoker -- 1.00 packs/day for 7 years    Types: Cigarettes  . Smokeless tobacco: Never Used  . Alcohol Use: 0.0 oz/week    0 Standard drinks or equivalent per week     Comment: Occasionally   OB History    Gravida Para Term Preterm AB TAB SAB  Ectopic Multiple Living   0 1 0 1 0 0 2     Review of Systems  Constitutional: Negative.   Eyes: Positive for visual disturbance.  Respiratory: Negative.   Cardiovascular: Negative.   Gastrointestinal: Positive for nausea and vomiting.  Musculoskeletal: Negative.   Skin: Negative.   Neurological: Positive for headaches.  Psychiatric/Behavioral: Negative.   All other systems reviewed and are negative.     Allergies  Codeine and Penicillins  Home Medications   Prior to Admission medications   Medication Sig Start Date End Date Taking? Authorizing Provider  ALPRAZolam Prudy Feeler) 0.25 MG tablet TAKE ONE TABLET BY MOUTH TWICE DAILY AS NEEDED. 01/12/15  Yes Lazaro Arms, MD  nortriptyline (PAMELOR) 10 MG capsule TAKE (1) CAPSULE BY MOUTH AT BEDTIME. 08/09/15  Yes Anson Fret, MD  omeprazole (PRILOSEC) 20 MG capsule TAKE ONE CAPSULE BY MOUTH DAILY. Patient taking differently: TAKE ONE CAPSULE BY MOUTH DAILY as needed for acid reflux 05/23/15  Yes Lazaro Arms, MD  acetaminophen (TYLENOL) 500 MG tablet Take 1,000 mg by mouth every 6 (six) hours as needed for mild pain.     Historical Provider, MD  albuterol (PROVENTIL HFA;VENTOLIN HFA) 108 (90 BASE) MCG/ACT inhaler Inhale 2 puffs into the lungs every 6 (six) hours as needed for wheezing or shortness of breath. 08/11/13  Jacklyn Shell, CNM  cephALEXin (KEFLEX) 500 MG capsule Take 1 capsule (500 mg total) by mouth 4 (four) times daily. Patient not taking: Reported on 08/10/2015 06/05/15   Mancel Bale, MD  ibuprofen (ADVIL,MOTRIN) 600 MG tablet Take 1 tablet (600 mg total) by mouth every 6 (six) hours. Patient not taking: Reported on 08/10/2015 02/15/14   Montez Morita, CNM   BP 112/67 mmHg  Pulse 86  Temp(Src) 98.5 F (36.9 C) (Oral)  Resp 18  SpO2 99%  LMP 07/19/2015 Physical Exam  Constitutional: She is oriented to person, place, and time. She appears well-developed and well-nourished.  HENT:  Head: Normocephalic  and atraumatic.  Eyes: Conjunctivae are normal. Pupils are equal, round, and reactive to light.  Neck: Neck supple. No tracheal deviation present. No thyromegaly present.  Cardiovascular: Normal rate and regular rhythm.   No murmur heard. Pulmonary/Chest: Effort normal and breath sounds normal.  Abdominal: Soft. Bowel sounds are normal. She exhibits no distension. There is no tenderness.  Musculoskeletal: Normal range of motion. She exhibits no edema or tenderness.  Neurological: She is alert and oriented to person, place, and time. No cranial nerve deficit. Coordination normal.  Gait normal Romberg normal pronator drift and normal finger to nose normal  Skin: Skin is warm and dry. No rash noted.  Psychiatric: She has a normal mood and affect.  Nursing note and vitals reviewed.   ED Course  Procedures (including critical care time) Labs Review Labs Reviewed - No data to display  Imaging Review No results found. I have personally reviewed and evaluated these images and lab results as part of my medical decision-making.   EKG Interpretation None     3:40 PM patient feels improved after treatment with oral Reglan. She feels ready to go home headache is minimal at present MDM  plan she is to follow-up with her neurologist Diagnosis migraine headache Final diagnoses:  None        Doug Sou, MD 08/10/15 1554

## 2015-08-10 NOTE — ED Notes (Signed)
Pt here for severe HA, vision changes and vomiting. Pt very anxious.

## 2015-08-10 NOTE — Discharge Instructions (Signed)
Migraine Headache Keep your scheduled appointment with your neurologist next month. Is okay to take Motrin or Advil for pain. Call your neurologist today to let him/her know if today's visits here. A migraine headache is very bad, throbbing pain on one or both sides of your head. Talk to your doctor about what things may bring on (trigger) your migraine headaches. HOME CARE  Only take medicines as told by your doctor.  Lie down in a dark, quiet room when you have a migraine.  Keep a journal to find out if certain things bring on migraine headaches. For example, write down:  What you eat and drink.  How much sleep you get.  Any change to your diet or medicines.  Lessen how much alcohol you drink.  Quit smoking if you smoke.  Get enough sleep.  Lessen any stress in your life.  Keep lights dim if bright lights bother you or make your migraines worse. GET HELP RIGHT AWAY IF:   Your migraine becomes really bad.  You have a fever.  You have a stiff neck.  You have trouble seeing.  Your muscles are weak, or you lose muscle control.  You lose your balance or have trouble walking.  You feel like you will pass out (faint), or you pass out.  You have really bad symptoms that are different than your first symptoms. MAKE SURE YOU:   Understand these instructions.  Will watch your condition.  Will get help right away if you are not doing well or get worse.   This information is not intended to replace advice given to you by your health care provider. Make sure you discuss any questions you have with your health care provider.   Document Released: 03/27/2008 Document Revised: 09/10/2011 Document Reviewed: 02/23/2013 Elsevier Interactive Patient Education [employer]

## 2015-08-17 ENCOUNTER — Ambulatory Visit (INDEPENDENT_AMBULATORY_CARE_PROVIDER_SITE_OTHER): Payer: Medicaid Other | Admitting: Neurology

## 2015-08-17 ENCOUNTER — Encounter: Payer: Self-pay | Admitting: Neurology

## 2015-08-17 VITALS — BP 117/76 | HR 101 | Temp 98.6°F | Ht 62.0 in | Wt 159.4 lb

## 2015-08-17 DIAGNOSIS — N910 Primary amenorrhea: Secondary | ICD-10-CM

## 2015-08-17 DIAGNOSIS — G43409 Hemiplegic migraine, not intractable, without status migrainosus: Secondary | ICD-10-CM

## 2015-08-17 MED ORDER — DICLOFENAC POTASSIUM(MIGRAINE) 50 MG PO PACK: 50.0000 mg | PACK | Freq: Once | ORAL | Status: DC | PRN

## 2015-08-17 MED ORDER — METOCLOPRAMIDE HCL 10 MG PO TABS: 10.0000 mg | ORAL_TABLET | Freq: Once | ORAL | Status: DC

## 2015-08-17 MED ORDER — NORTRIPTYLINE HCL 10 MG PO CAPS: 20.0000 mg | ORAL_CAPSULE | Freq: Every day | ORAL | Status: DC
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
## 2015-08-17 NOTE — Patient Instructions (Signed)
Overall you are doing fairly well but I do want to suggest a few things today:   Remember to drink plenty of fluid, eat healthy meals and do not skip any meals. Try to eat protein with a every meal and eat a healthy snack such as fruit or nuts in between meals. Try to keep a regular sleep-wake schedule and try to exercise daily, particularly in the form of walking, 20-30 minutes a day, if you can.   As far as your medications are concerned, I would like to suggest Increase Nortriptyline to  at night At onset of migraine take Cambia and Reglan. Can repeat in 4 hours.   I would like to see you back in 4 months, sooner if we need to. Please call us with any interim questions, concerns, problems, updates or refill requests.   Our phone number is [phone]. We also have an after hours call service for urgent matters and there is a physician on-call for urgent questions. For any emergencies you know to call 911 or go to the nearest emergency room

## 2015-08-18 ENCOUNTER — Telehealth: Payer: Self-pay | Admitting: *Deleted

## 2015-08-18 LAB — HCG, SERUM, QUALITATIVE: hCG,Beta Subunit,Qual,Serum: NEGATIVE m[IU]/mL (ref <6)

## 2015-08-18 NOTE — Telephone Encounter (Signed)
Called pt. LVM for pt to call about results. Gave [facility] phone number.  Ok to inform pt urine pregnancy test was negative per Dr Lucia Gaskins.

## 2015-08-18 NOTE — Telephone Encounter (Signed)
Called pt back. Advised blood test (not urine) was negative for pregnancy test. She verbalized understanding.

## 2015-08-18 NOTE — Telephone Encounter (Signed)
-----   Message from Anson Fret, MD sent at 08/18/2015  7:54 AM EST ----- Negative urine pregnancy test thanks

## 2015-08-18 NOTE — Telephone Encounter (Signed)
Patient is returning a call regarding lab results.

## 2015-10-17 ENCOUNTER — Other Ambulatory Visit: Payer: Self-pay | Admitting: Adult Health

## 2015-10-17 ENCOUNTER — Encounter: Payer: Self-pay | Admitting: Adult Health

## 2015-10-17 ENCOUNTER — Ambulatory Visit (INDEPENDENT_AMBULATORY_CARE_PROVIDER_SITE_OTHER): Payer: Medicaid Other | Admitting: Adult Health

## 2015-10-17 VITALS — BP 122/60 | HR 110 | Temp 99.0°F | Ht 63.0 in | Wt 162.5 lb

## 2015-10-17 DIAGNOSIS — O3680X0 Pregnancy with inconclusive fetal viability, not applicable or unspecified: Secondary | ICD-10-CM

## 2015-10-17 DIAGNOSIS — N926 Irregular menstruation, unspecified: Secondary | ICD-10-CM

## 2015-10-17 DIAGNOSIS — Z720 Tobacco use: Secondary | ICD-10-CM

## 2015-10-17 DIAGNOSIS — F419 Anxiety disorder, unspecified: Secondary | ICD-10-CM

## 2015-10-17 DIAGNOSIS — Z3201 Encounter for pregnancy test, result positive: Secondary | ICD-10-CM

## 2015-10-17 DIAGNOSIS — G43409 Hemiplegic migraine, not intractable, without status migrainosus: Secondary | ICD-10-CM

## 2015-10-17 DIAGNOSIS — F17200 Nicotine dependence, unspecified, uncomplicated: Secondary | ICD-10-CM

## 2015-10-17 DIAGNOSIS — Z3490 Encounter for supervision of normal pregnancy, unspecified, unspecified trimester: Secondary | ICD-10-CM

## 2015-10-17 LAB — POCT URINE PREGNANCY: Preg Test, Ur: POSITIVE — AB

## 2015-10-17 MED ORDER — PRENATAL PLUS 27-1 MG PO TABS: 1.0000 | ORAL_TABLET | Freq: Every day | ORAL | Status: DC

## 2015-10-17 NOTE — Progress Notes (Signed)
Subjective:     Patient ID: Meagan [name], female   DOB: 05/19/1991, 25 y.o.   MRN: [id number]  HPI Meagan EvansCatherine is a [age] year old white female in for UPT had +HPT, not sure when last period was.Had hemiplegic migraine this weekend and is on pamelor and takes diclofenac,she takes xanax for anxiety.   Review of Systems Patient denies any  Daily headaches, hearing loss, fatigue, blurred vision, shortness of breath, chest pain, abdominal pain, problems with bowel movements, urination, or intercourse. No joint pain or mood swings.See HPI for positives. Reviewed past medical,surgical, social and family history. Reviewed medications and allergies.     Objective:   Physical Exam BP 122/60 mmHg  Pulse 110  Temp(Src) 99 F (37.2 C)  Ht 5\' 3"  (1.6 m)  Wt 162 lb 8 oz (73.71 kg)  BMI 28.79 kg/m2  LMP 07/19/2015  Breastfeeding? No UPT +, unknown period for sure, Skin warm and dry. Neck: mid line trachea, normal thyroid, good ROM, no lymphadenopathy noted. Lungs: clear to ausculation bilaterally. Cardiovascular: regular rate and rhythm.Abdomen soft and non tender, no HSM, US shows small fluid sac in uterus, no YS or fetal pole, [facility] progesterone [facility] and schedule US in 3 weeks, may move up depending on labs, discussed that she needs to decrease smoking and wean off xanax.Can take pamelor for now, but do not take diclofenac can take tylenol.Mediciad form given, with estimated EDD 06/15/16. Discussed with Dr Despina HiddenEure. Face time 15 minutes with 50% counseling.    Assessment:     UPT + Pregnant Anxiety Smoker Migraines,hemiplegic     Plan:     Rx prenatal plus #30 take 1 daily with 12 refills Do not take diclofenac Start decreasing cigarettes Wean off xanax Ok to take pamelor  Follow up in 3 weeks for US Check QHCG and progesterone today, will talk in am Review handout on first trimester

## 2015-10-17 NOTE — Patient Instructions (Addendum)
Do not take diclofenac Try to wean off xanax slowly Follow up in 3 weeks for [name] First Trimester of Pregnancy The first trimester of pregnancy is from week 1 until the end of week 12 (months 1 through 3). A week after a sperm fertilizes an egg, the egg will implant on the wall of the uterus. This embryo will begin to develop into a baby. Genes from you and your partner are forming the baby. The female genes determine whether the baby is a boy or a girl. At 6-8 weeks, the eyes and face are formed, and the heartbeat can be seen on ultrasound. At the end of 12 weeks, all the baby's organs are formed.  Now that you are pregnant, you will want to do everything you can to have a healthy baby. Two of the most important things are to get good prenatal care and to follow your health care provider's instructions. Prenatal care is all the medical care you receive before the baby's birth. This care will help prevent, find, and treat any problems during the pregnancy and childbirth. BODY CHANGES Your body goes through many changes during pregnancy. The changes vary from woman to woman.   You may gain or lose a couple of pounds at first.  You may feel sick to your stomach (nauseous) and throw up (vomit). If the vomiting is uncontrollable, call your health care provider.  You may tire easily.  You may develop headaches that can be relieved by medicines approved by your health care provider.  You may urinate more often. Painful urination may mean you have a bladder infection.  You may develop heartburn as a result of your pregnancy.  You may develop constipation because certain hormones are causing the muscles that push waste through your intestines to slow down.  You may develop hemorrhoids or swollen, bulging veins (varicose veins).  Your breasts may begin to grow larger and become tender. Your nipples may stick out more, and the tissue that surrounds them (areola) may become darker.  Your gums may bleed  and may be sensitive to brushing and flossing.  Dark spots or blotches (chloasma, mask of pregnancy) may develop on your face. This will likely fade after the baby is born.  Your menstrual periods will stop.  You may have a loss of appetite.  You may develop cravings for certain kinds of food.  You may have changes in your emotions from day to day, such as being excited to be pregnant or being concerned that something may go wrong with the pregnancy and baby.  You may have more vivid and strange dreams.  You may have changes in your hair. These can include thickening of your hair, rapid growth, and changes in texture. Some women also have hair loss during or after pregnancy, or hair that feels dry or thin. Your hair will most likely return to normal after your baby is born. WHAT TO EXPECT AT YOUR PRENATAL VISITS During a routine prenatal visit:  You will be weighed to make sure you and the baby are growing normally.  Your blood pressure will be taken.  Your abdomen will be measured to track your baby's growth.  The fetal heartbeat will be listened to starting around week 10 or 12 of your pregnancy.  Test results from any previous visits will be discussed. Your health care provider may ask you:  How you are feeling.  If you are feeling the baby move.  If you have had any abnormal symptoms, such  as leaking fluid, bleeding, severe headaches, or abdominal cramping.  If you are using any tobacco products, including cigarettes, chewing tobacco, and electronic cigarettes.  If you have any questions. Other tests that may be performed during your first trimester include:  Blood tests to find your blood type and to check for the presence of any previous infections. They will also be used to check for low iron levels (anemia) and Rh antibodies. Later in the pregnancy, blood tests for diabetes will be done along with other tests if problems develop.  Urine tests to check for infections,  diabetes, or protein in the urine.  An ultrasound to confirm the proper growth and development of the baby.  An amniocentesis to check for possible genetic problems.  Fetal screens for spina bifida and Down syndrome.  You may need other tests to make sure you and the baby are doing well.  HIV (human immunodeficiency virus) testing. Routine prenatal testing includes screening for HIV, unless you choose not to have this test. HOME CARE INSTRUCTIONS  Medicines  Follow your health care provider's instructions regarding medicine use. Specific medicines may be either safe or unsafe to take during pregnancy.  Take your prenatal vitamins as directed.  If you develop constipation, try taking a stool softener if your health care provider approves. Diet  Eat regular, well-balanced meals. Choose a variety of foods, such as meat or vegetable-based protein, fish, milk and low-fat dairy products, vegetables, fruits, and whole grain breads and cereals. Your health care provider will help you determine the amount of weight gain that is right for you.  Avoid raw meat and uncooked cheese. These carry germs that can cause birth defects in the baby.  Eating four or five small meals rather than three large meals a day may help relieve nausea and vomiting. If you start to feel nauseous, eating a few soda crackers can be helpful. Drinking liquids between meals instead of during meals also seems to help nausea and vomiting.  If you develop constipation, eat more high-fiber foods, such as fresh vegetables or fruit and whole grains. Drink enough fluids to keep your urine clear or pale yellow. Activity and Exercise  Exercise only as directed by your health care provider. Exercising will help you:  Control your weight.  Stay in shape.  Be prepared for labor and delivery.  Experiencing pain or cramping in the lower abdomen or low back is a good sign that you should stop exercising. Check with your health  care provider before continuing normal exercises.  Try to avoid standing for long periods of time. Move your legs often if you must stand in one place for a long time.  Avoid heavy lifting.  Wear low-heeled shoes, and practice good posture.  You may continue to have sex unless your health care provider directs you otherwise. Relief of Pain or Discomfort  Wear a good support bra for breast tenderness.   Take warm sitz baths to soothe any pain or discomfort caused by hemorrhoids. Use hemorrhoid cream if your health care provider approves.   Rest with your legs elevated if you have leg cramps or low back pain.  If you develop varicose veins in your legs, wear support hose. Elevate your feet for 15 minutes, 3-4 times a day. Limit salt in your diet. Prenatal Care  Schedule your prenatal visits by the twelfth week of pregnancy. They are usually scheduled monthly at first, then more often in the last 2 months before delivery.  Write down your  questions. Take them to your prenatal visits.  Keep all your prenatal visits as directed by your health care provider. Safety  Wear your seat belt at all times when driving.  Make a list of emergency phone numbers, including numbers for family, friends, the hospital, and police and fire departments. General Tips  Ask your health care provider for a referral to a local prenatal education class. Begin classes no later than at the beginning of month 6 of your pregnancy.  Ask for help if you have counseling or nutritional needs during pregnancy. Your health care provider can offer advice or refer you to specialists for help with various needs.  Do not use hot tubs, steam rooms, or saunas.  Do not douche or use tampons or scented sanitary pads.  Do not cross your legs for long periods of time.  Avoid cat litter boxes and soil used by cats. These carry germs that can cause birth defects in the baby and possibly loss of the fetus by miscarriage or  stillbirth.  Avoid all smoking, herbs, alcohol, and medicines not prescribed by your health care provider. Chemicals in these affect the formation and growth of the baby.  Do not use any tobacco products, including cigarettes, chewing tobacco, and electronic cigarettes. If you need help quitting, ask your health care provider. You may receive counseling support and other resources to help you quit.  Schedule a dentist appointment. At home, brush your teeth with a soft toothbrush and be gentle when you floss. SEEK MEDICAL CARE IF:   You have dizziness.  You have mild pelvic cramps, pelvic pressure, or nagging pain in the abdominal area.  You have persistent nausea, vomiting, or diarrhea.  You have a bad smelling vaginal discharge.  You have pain with urination.  You notice increased swelling in your face, hands, legs, or ankles. SEEK IMMEDIATE MEDICAL CARE IF:   You have a fever.  You are leaking fluid from your vagina.  You have spotting or bleeding from your vagina.  You have severe abdominal cramping or pain.  You have rapid weight gain or loss.  You vomit blood or material that looks like coffee grounds.  You are exposed to [place] measles and have never had them.  You are exposed to fifth disease or chickenpox.  You develop a severe headache.  You have shortness of breath.  You have any kind of trauma, such as from a fall or a car accident.   This information is not intended to replace advice given to you by your health care provider. Make sure you discuss any questions you have with your health care provider.   Document Released: 06/12/2001 Document Revised: 07/09/2014 Document Reviewed: 04/28/2013 Elsevier Interactive Patient Education 2016 [employer] Start decreasing cigarettes

## 2015-10-18 LAB — BETA HCG QUANT (REF LAB): hCG Quant: 487 m[IU]/mL

## 2015-10-18 LAB — PROGESTERONE: Progesterone: 17.2 ng/mL

## 2015-10-19 ENCOUNTER — Telehealth: Payer: Self-pay | Admitting: Adult Health

## 2015-10-19 NOTE — Telephone Encounter (Signed)
For some reason, lab results are not showing up in Epic yet. I called Larita FifeLynn and got results. Quant on 10/17/15 was 487 and progesterone was 17.2. JAG reviewed results and pt is between 4-[redacted] weeks pregnant. Progesterone level is good. Advised to keep US appt and call us if starts bleeding or having any concerns. Pt voiced understanding. JSY

## 2015-10-19 NOTE — Telephone Encounter (Signed)
Left message x 1. When did pt have labs? JSY

## 2015-10-30 ENCOUNTER — Ambulatory Visit (HOSPITAL_COMMUNITY)
Admission: EM | Admit: 2015-10-30 | Discharge: 2015-10-30 | Disposition: A | Discharge status: Home / Self Care | Payer: Medicaid Other | Attending: Emergency Medicine | Admitting: Emergency Medicine

## 2015-10-30 ENCOUNTER — Encounter (HOSPITAL_COMMUNITY): Payer: Self-pay | Admitting: Emergency Medicine

## 2015-10-30 DIAGNOSIS — Z3490 Encounter for supervision of normal pregnancy, unspecified, unspecified trimester: Secondary | ICD-10-CM

## 2015-10-30 DIAGNOSIS — N390 Urinary tract infection, site not specified: Secondary | ICD-10-CM

## 2015-10-30 DIAGNOSIS — Z331 Pregnant state, incidental: Secondary | ICD-10-CM

## 2015-10-30 DIAGNOSIS — R042 Hemoptysis: Secondary | ICD-10-CM

## 2015-10-30 DIAGNOSIS — K529 Noninfective gastroenteritis and colitis, unspecified: Secondary | ICD-10-CM | POA: Diagnosis not present

## 2015-10-30 MED ORDER — ONDANSETRON 4 MG PO TBDP: 4.0000 mg | ORAL_TABLET | Freq: Three times a day (TID) | ORAL | Status: DC | PRN

## 2015-10-30 MED ORDER — ONDANSETRON 4 MG PO TBDP
ORAL_TABLET | ORAL | Status: AC
  Filled 2015-10-30: qty 1

## 2015-10-30 MED ORDER — ONDANSETRON 4 MG PO TBDP
4.0000 mg | ORAL_TABLET | Freq: Once | ORAL | Status: AC
  Administered 2015-10-30: 4 mg via ORAL

## 2015-10-30 MED ORDER — GUAIFENESIN ER 600 MG PO TB12: 600.0000 mg | ORAL_TABLET | Freq: Two times a day (BID) | ORAL | Status: DC

## 2015-10-30 NOTE — ED Notes (Signed)
Patient tolerated oral fluid challenge well.

## 2015-10-30 NOTE — ED Provider Notes (Signed)
CSN: [id number]     Arrival date & time 10/30/15  1937 History   First MD Initiated Contact with Patient 10/30/15 2000     Chief Complaint  Patient presents with  . Nausea  . Emesis   (Consider location/radiation/quality/duration/timing/severity/associated sxs/prior Treatment) HPI  She is a 25 year old woman here for evaluation of vomiting. She is [redacted] weeks pregnant.She states this all started about 5 or 6 days ago when she noticed some blood-tinged sputum. She went to an urgent care 2 days ago and was diagnosed with a UTI. She was started on Keflex. Today, she has been unable to take the antibiotic due to nausea and vomiting. She does report some morning sickness, but states this feels more like a stomach bug. She reports crampy abdominal pain as well as some diarrhea. No vaginal bleeding or pelvic pain. No fevers. No shortness of breath. She has continued to have blood-tinged sputum. This only occurs first thing in the morning.  She is a current smoker, but wants to quit.  Past Medical History  Diagnosis Date  . Anxiety   . Mental disorder     huffed inhalants "affected brain"  . Stomach ulcer   . Diarrhea 06/04/2013  . Sore throat 06/04/2013  . Nausea 06/04/2013  . Pregnant 06/04/2013  . Kidney stones   . Pyelonephritis complicating pregnancy, antepartum, first trimester 11/05/2011  . Asthma     inhaler at bedside  . Hemiplegic migraine     blurred vision, nausea, dizziness, numbness  . Daily headache   . Pregnant 10/17/2015   Past Surgical History  Procedure Laterality Date  . No past surgery     Family History  Problem Relation Age of Onset  . Cancer Maternal Grandmother     breast  . Cancer Paternal Grandfather     lung cancer  . Hypertension Other   . Migraines Father   . Other Son     ventricular defect   Social History  Substance Use Topics  . Smoking status: Current Every Day Smoker -- 1.00 packs/day for 7 years    Types: Cigarettes  . Smokeless tobacco: Never  Used  . Alcohol Use: No     Comment: Occasionally; not now   OB History    Gravida Para Term Preterm AB TAB SAB Ectopic Multiple Living   4 2 2  0 1 0 1 0 0 2     Review of Systems As in history of present illness Allergies  Codeine and Penicillins  Home Medications   Prior to Admission medications   Medication Sig Start Date End Date Taking? Authorizing Provider  ALPRAZolam Prudy Feeler(XANAX) 0.25 MG tablet TAKE ONE TABLET BY MOUTH TWICE DAILY AS NEEDED. 01/12/15  Yes Lazaro ArmsLuther H Eure, MD  cephALEXin (KEFLEX) 500 MG capsule Take 500 mg by mouth 4 (four) times daily.   Yes Historical Provider, MD  nortriptyline (PAMELOR) 10 MG capsule Take 2 capsules (20 mg total) by mouth at bedtime. 08/17/15  Yes Anson FretAntonia B Ahern, MD  prenatal vitamin w/FE, FA (PRENATAL 1 + 1) 27-1 MG TABS tablet Take 1 tablet by mouth daily at 12 noon. 10/17/15  Yes Adline PotterJennifer A Griffin, NP  acetaminophen (TYLENOL) 500 MG tablet Take 1,000 mg by mouth every 6 (six) hours as needed for mild pain.     Historical Provider, MD  Diclofenac Potassium 50 MG PACK Take 50 mg by mouth once as needed. Take once daily as needed with headache onset. Please take with food 08/17/15   Griselda MinerAntonia B  Lucia Gaskins, MD  guaiFENesin (MUCINEX) 600 MG 12 hr tablet Take 1 tablet (600 mg total) by mouth 2 (two) times daily. 10/30/15   Charm Rings, MD  metoCLOPramide (REGLAN) 10 MG tablet Take 1 tablet (10 mg total) by mouth once. At onset of headache. Can repeat in 4-6 hours if needed. 08/17/15   Anson Fret, MD  ondansetron (ZOFRAN ODT) 4 MG disintegrating tablet Take 1 tablet (4 mg total) by mouth every 8 (eight) hours as needed for nausea or vomiting. 10/30/15   Charm Rings, MD   Meds Ordered and Administered this Visit   Medications  ondansetron (ZOFRAN-ODT) disintegrating tablet 4 mg (4 mg Oral Given 10/30/15 2036)    BP 120/72 mmHg  Pulse 85  Temp(Src) 98 F (36.7 C) (Oral)  Resp 18  SpO2 100%  LMP 07/19/2015 No data found.   Physical Exam   Constitutional: She appears well-developed and well-nourished. No distress.  Cardiovascular: Normal rate, regular rhythm and normal heart sounds.   No murmur heard. Pulmonary/Chest: Effort normal and breath sounds normal. No respiratory distress. She has no wheezes. She has no rales.  Abdominal: Soft. She exhibits no distension and no mass. There is no tenderness. There is no rebound and no guarding.    ED Course  Procedures (including critical care time)  Labs Review Labs Reviewed - No data to display  Imaging Review No results found.    MDM   1. Gastroenteritis   2. Pregnancy at early stage   3. UTI (lower urinary tract infection)   4. Cough with hemoptysis    Tolerating water after Zofran. She states she feels much better. I suspect she has a stomach bug on top of her morning sickness. Prescription given for Zofran to use as needed. I have encouraged her to restart her Keflex for UTI this evening. The report of a few specks of blood in her morning sputum is not particularly alarming given her smoking history.  I have provided reassurance. I did recommend guaifenesin for the next 2 weeks to help thin her mucus which I suspect will resolve the hemoptysis. Follow-up with OB as scheduled.     Charm Rings, MD 10/30/15 2101

## 2015-10-30 NOTE — ED Notes (Signed)
The [facility] with a complaint of N/V/D that started this am. The patient stated that she was at her PCP for hemoptysis and was prescribed an antibiotic for a UTI.

## 2015-10-30 NOTE — ED Notes (Signed)
Patient given an oral fluid challenge after zofran by MDs verbal order.

## 2015-10-30 NOTE — Discharge Instructions (Signed)
You have a stomach bug on top of your morning sickness. Use the Zofran every 8 hours as needed for nausea or vomiting. The vomiting and diarrhea should resolve over the next 2 days.  Please restart your antibiotics for the urinary tract infection.  Take guaifenesin twice a day for the next 2 weeks. This will help thin the sputum and help your cough. This should improve the blood you see in your sputum.  Follow-up as needed.

## 2015-11-02 ENCOUNTER — Telehealth: Payer: Self-pay | Admitting: *Deleted

## 2015-11-02 NOTE — Telephone Encounter (Signed)
Spoke with pt. Pt went to Urgent Care on 10/29/15. She was prescribed Keflex 500 mg TID. She was told she had E.coli. Pt states she can't take it TID, has been taking it once daily. Pt states she has been really sick. Pt has had nausea but hasn't been taking med with a lot of food. I spoke with Drenda FreezeFran, CNM and she advised can take Keflex BID for uncomplicated UTI. I let pt know and advised to take with food. Pt was advised to call us back with any problems or concerns. Pt voiced understanding. JSY

## 2015-11-02 NOTE — Telephone Encounter (Signed)
Pt called stating that she is having severe panic attacks and is shaking and feeling like she doesn't know where she is or where she is going.  She is pregnant and is scheduled for her dating US on 5/8 here.  Pt states she is taking 0.5mg  Xanax twice a day but it is not helping her at all anymore, she also states she is no longer seeing a counselor but is [facility] to get back in with them.  Discussed with Joellyn HaffKim Booker, CNM and she states to have pt to go ED if feel immediate need for help and also to [facility] and see how quickly they can get her in.  Pt also needs to wean off the Xanax and be changed to alternative medication.  When I instructed pt about the medication she said she really does not want to change anything about her medication without seeing someone about it because she "freaks out" when starts a new medication because she is afraid of what it might do to her.  Advised pt to go to ED and also [facility]AP to get in with them and to keep scheduled appointment here and we will call her back about the medication.  Pt verbalized understanding.

## 2015-11-03 NOTE — Telephone Encounter (Signed)
Pt scheduled to discuss Xanax during pregnancy after her ultrasound on 11/07/2015.

## 2015-11-07 ENCOUNTER — Telehealth: Payer: Self-pay | Admitting: Neurology

## 2015-11-07 ENCOUNTER — Ambulatory Visit (INDEPENDENT_AMBULATORY_CARE_PROVIDER_SITE_OTHER): Payer: Medicaid Other

## 2015-11-07 ENCOUNTER — Encounter: Payer: Self-pay | Admitting: Obstetrics and Gynecology

## 2015-11-07 ENCOUNTER — Ambulatory Visit (INDEPENDENT_AMBULATORY_CARE_PROVIDER_SITE_OTHER): Payer: Medicaid Other | Admitting: Obstetrics and Gynecology

## 2015-11-07 VITALS — BP 118/76 | HR 96 | Ht 62.0 in | Wt 161.5 lb

## 2015-11-07 DIAGNOSIS — Z3A01 Less than 8 weeks gestation of pregnancy: Secondary | ICD-10-CM

## 2015-11-07 DIAGNOSIS — Z331 Pregnant state, incidental: Secondary | ICD-10-CM

## 2015-11-07 DIAGNOSIS — O99341 Other mental disorders complicating pregnancy, first trimester: Secondary | ICD-10-CM

## 2015-11-07 DIAGNOSIS — Z369 Encounter for antenatal screening, unspecified: Secondary | ICD-10-CM

## 2015-11-07 DIAGNOSIS — O0993 Supervision of high risk pregnancy, unspecified, third trimester: Secondary | ICD-10-CM | POA: Diagnosis not present

## 2015-11-07 DIAGNOSIS — O99331 Smoking (tobacco) complicating pregnancy, first trimester: Secondary | ICD-10-CM | POA: Diagnosis not present

## 2015-11-07 DIAGNOSIS — O3680X0 Pregnancy with inconclusive fetal viability, not applicable or unspecified: Secondary | ICD-10-CM | POA: Diagnosis not present

## 2015-11-07 DIAGNOSIS — O1211 Gestational proteinuria, first trimester: Secondary | ICD-10-CM

## 2015-11-07 DIAGNOSIS — Z1389 Encounter for screening for other disorder: Secondary | ICD-10-CM | POA: Diagnosis not present

## 2015-11-07 LAB — POCT URINALYSIS DIPSTICK
Glucose, UA: NEGATIVE
Ketones, UA: NEGATIVE
Leukocytes, UA: NEGATIVE
Nitrite, UA: NEGATIVE

## 2015-11-07 MED ORDER — ESCITALOPRAM OXALATE 10 MG PO TABS: 10.0000 mg | ORAL_TABLET | Freq: Every day | ORAL | Status: DC

## 2015-11-07 NOTE — Telephone Encounter (Signed)
Patient called, states she just found out she is pregnant not long ago, wants to know if Diclofenac Potassium 50 MG PACK can be adjusted.

## 2015-11-07 NOTE — Progress Notes (Signed)
US 7 wks,single IUP w/ys, pos fht 142 bpm,normal ov's bilat,crl 10.6 mm,subchorionic hemorrhage 2.6 x 1.6 x 2.2 cm

## 2015-11-07 NOTE — Telephone Encounter (Signed)
She should stop the Diclofenac(cambia), this is NOT considered safe in pregnancy. Nortriptyline and reglan are considered low risk in pregnancy but no medication is entirely safe. There have been rare associations with Nortriptyline and limb anomalies and cardiac anomalies and other birth defects, still I do use nortriptyline in pregnancy I would just recommend stopping until she is done with the first trimester and coming back to see me for reevaluation before restarting. She should consult with her obgyn about the Reglan, this is something she could potentially use in place of the diclofenac (cambia) along with tylenol. Thanks.

## 2015-11-07 NOTE — Progress Notes (Signed)
Patient ID: Meagan [name], female   DOB: 08/14/1990, 25 y.o.   MRN: [id number] Pt here today to discuss her medications. Pt has form for lab work and will get that before leaving today.

## 2015-11-07 NOTE — Progress Notes (Signed)
Patient ID: Meagan PellegriniCatherine P [name], female   DOB: 11/14/1990, 25 y.o.   MRN: [id number]  [id number] Estimated Date of Delivery: 06/25/16  Blood pressure 118/76, pulse 96, height 5\' 2"  (1.575 m), weight 161 lb 8 oz (73.256 kg), last menstrual period 07/19/2015, not currently breastfeeding.   refer to the ob flow sheet for FH and FHR, also BP, Wt, Urine results:notable for trace blood and trace protein  Patient reports too early for fetal movement, denies any bleeding and no rupture of membranes symptoms or regular contractions. Patient complaints: Patient wishes to discuss medications, as she is currently taking Xanax. Patient reports severe anxiety with panic attacks, requiring multiple hospitalizations. Patient states her PCP had recently increased her dose to 0.5 mg Xanax dosages taken morning, noon, 3 pm, and 6 pm. She states    Questions were answered. Assessment: LROB [id number] @ 1031w0d    Dating and viability u/s done today.    Xanax use due to anxiety disorder. Will attempt to wean. Add Lexapro at this time.    Plan:  Continued routine obstetrical care,   Reduce Xanax dosing by adding Lexapro    F/u in 4 weeks for pnx care   By signing my name below, I, Meagan [name], attest that this documentation has been prepared under the direction and in the presence of Meagan BurrowJohn Shakila Mak V, MD. Electronically Signed: Ronney LionSuzanne [name], ED Scribe. 11/07/2015. 12:16 PM.  I personally performed the services described in this documentation, which was SCRIBED in my presence. The recorded information has been reviewed and considered accurate. It has been edited as necessary during review. Meagan BurrowFERGUSON,Marijean Montanye V, MD

## 2015-11-08 LAB — RPR: RPR Ser Ql: NONREACTIVE

## 2015-11-08 LAB — PMP SCREEN PROFILE (10S), URINE
Amphetamine Screen, Ur: NEGATIVE ng/mL
Barbiturate Screen, Ur: NEGATIVE ng/mL
Benzodiazepine Screen, Urine: POSITIVE ng/mL
Cannabinoids Ur Ql Scn: NEGATIVE ng/mL
Cocaine(Metab.)Screen, Urine: NEGATIVE ng/mL
Creatinine(Crt), U: 207.1 mg/dL (ref 20.0–300.0)
Methadone Scn, Ur: NEGATIVE ng/mL
Opiate Scrn, Ur: NEGATIVE ng/mL
Oxycodone+Oxymorphone Ur Ql Scn: NEGATIVE ng/mL
PCP Scrn, Ur: NEGATIVE ng/mL
Ph of Urine: 5.7 (ref 4.5–8.9)
Propoxyphene, Screen: NEGATIVE ng/mL

## 2015-11-08 LAB — GC/CHLAMYDIA PROBE AMP
Chlamydia trachomatis, NAA: NEGATIVE
Neisseria gonorrhoeae by PCR: NEGATIVE

## 2015-11-08 LAB — URINALYSIS, ROUTINE W REFLEX MICROSCOPIC
Bilirubin, UA: NEGATIVE
Glucose, UA: NEGATIVE
Ketones, UA: NEGATIVE
Nitrite, UA: NEGATIVE
Protein, UA: NEGATIVE
RBC, UA: NEGATIVE
Specific Gravity, UA: 1.027 (ref 1.005–1.030)
Urobilinogen, Ur: 0.2 mg/dL (ref 0.2–1.0)
pH, UA: 6 (ref 5.0–7.5)

## 2015-11-08 LAB — MICROSCOPIC EXAMINATION
Casts: NONE SEEN /lpf
Epithelial Cells (non renal): >10 /hpf — AB (ref 0–10)

## 2015-11-08 LAB — HIV ANTIBODY (ROUTINE TESTING W REFLEX): HIV Screen 4th Generation wRfx: NONREACTIVE

## 2015-11-08 LAB — CBC
Hematocrit: 40.5 % (ref 34.0–46.6)
Hemoglobin: 13.6 g/dL (ref 11.1–15.9)
MCH: 30 pg (ref 26.6–33.0)
MCHC: 33.6 g/dL (ref 31.5–35.7)
MCV: 89 fL (ref 79–97)
Platelets: 199 10*3/uL (ref 150–379)
RBC: 4.53 x10E6/uL (ref 3.77–5.28)
RDW: 14.8 % (ref 12.3–15.4)
WBC: 7.5 10*3/uL (ref 3.4–10.8)

## 2015-11-08 LAB — ANTIBODY SCREEN: Antibody Screen: NEGATIVE

## 2015-11-08 LAB — ABO/RH: Rh Factor: NEGATIVE

## 2015-11-08 LAB — HEPATITIS B SURFACE ANTIGEN: Hepatitis B Surface Ag: NEGATIVE

## 2015-11-08 LAB — RUBELLA SCREEN: Rubella Antibodies, IGG: 1.73 index (ref >0.99)

## 2015-11-08 LAB — VARICELLA ZOSTER ANTIBODY, IGG: Varicella zoster IgG: <135 index — ABNORMAL LOW (ref >165)

## 2015-11-08 NOTE — Telephone Encounter (Signed)
Dr Lucia GaskinsAhern- please advise  Called pt back. Relayed Dr Trevor MaceAhern's message below. Pt states she had appt with OBGYN yesterday and per pt, they told her nortriptyline and reglan would be fine to continue to take.I advised per Dr Lucia GaskinsAhern: "Nortriptyline and reglan are considered low risk in pregnancy but no medication is entirely safe. There have been rare associations with Nortriptyline and limb anomalies and cardiac anomalies and other birth defects, still I do use nortriptyline in pregnancy I would just recommend stopping until she is done with the first trimester and coming back to see me for reevaluation before restarting". Pt verbalized understanding.  She states taking reglan/tylenol does not provide relief for the pain. She is wanting to know what else she can take. Advised she may have to still get approval from OBGYN as well for any new suggestions from Dr Lucia GaskinsAhern. She may have to come in for f/u if she wants to start her on a new medication. She wanted to come in ASAP. Advised I will have to ask Dr Lucia GaskinsAhern if we can fit her in somewhere. There are no available appt in the immediate future. Pt states since Dr Lucia GaskinsAhern increased her nortriptyline, she still had a severe headache about 3 weeks ago. Pt states OBGYN gave her zofran to help with nausea. They also gave her a different antidepressant (she could not remember the name) to help and try to wean her off of the xanax she is taking. I advised I will talk to Dr Lucia GaskinsAhern and call her back to advise by tomorrow at the latest.

## 2015-11-08 NOTE — Telephone Encounter (Signed)
Pt returned Emma's call. This was call taken by the answering service at 11:23am.

## 2015-11-08 NOTE — Telephone Encounter (Signed)
She can see a Publishing rights managernurse practitioner, see if Aundra MilletMegan or Eber JonesCarolyn have any openings if she wants to be seen asap thanks

## 2015-11-08 NOTE — Telephone Encounter (Signed)
LVM returning pt call. Gave [facility] phone number for her to call.

## 2015-11-09 LAB — URINE CULTURE: Organism ID, Bacteria: NO GROWTH

## 2015-11-09 NOTE — Telephone Encounter (Signed)
LVM for pt to call. Advised she can come in and see one of our NP's this week so she can be seen asap. Gave [facility] phone number,  Please schedule with NP if she calls.

## 2015-11-10 NOTE — Telephone Encounter (Signed)
Dr Lucia GaskinsAhern- pt never called back. She has appt with you next week. Do you want to do anything further?

## 2015-11-10 NOTE — Telephone Encounter (Signed)
That's fine. thanks

## 2015-11-15 ENCOUNTER — Other Ambulatory Visit (HOSPITAL_COMMUNITY)
Admission: RE | Admit: 2015-11-15 | Discharge: 2015-11-15 | Disposition: A | Discharge status: Home / Self Care | Payer: Medicaid Other | Source: Ambulatory Visit | Attending: Obstetrics & Gynecology | Admitting: Obstetrics & Gynecology

## 2015-11-15 ENCOUNTER — Encounter: Payer: Self-pay | Admitting: Women's Health

## 2015-11-15 ENCOUNTER — Ambulatory Visit (INDEPENDENT_AMBULATORY_CARE_PROVIDER_SITE_OTHER): Payer: Medicaid Other | Admitting: Women's Health

## 2015-11-15 VITALS — BP 112/60 | HR 64 | Wt 161.0 lb

## 2015-11-15 DIAGNOSIS — O360110 Maternal care for anti-D [Rh] antibodies, first trimester, not applicable or unspecified: Secondary | ICD-10-CM

## 2015-11-15 DIAGNOSIS — O09291 Supervision of pregnancy with other poor reproductive or obstetric history, first trimester: Secondary | ICD-10-CM | POA: Diagnosis not present

## 2015-11-15 DIAGNOSIS — Z01419 Encounter for gynecological examination (general) (routine) without abnormal findings: Secondary | ICD-10-CM | POA: Diagnosis not present

## 2015-11-15 DIAGNOSIS — Z1389 Encounter for screening for other disorder: Secondary | ICD-10-CM | POA: Diagnosis not present

## 2015-11-15 DIAGNOSIS — Z3481 Encounter for supervision of other normal pregnancy, first trimester: Secondary | ICD-10-CM | POA: Diagnosis not present

## 2015-11-15 DIAGNOSIS — Z124 Encounter for screening for malignant neoplasm of cervix: Secondary | ICD-10-CM

## 2015-11-15 DIAGNOSIS — Z3A09 9 weeks gestation of pregnancy: Secondary | ICD-10-CM | POA: Diagnosis not present

## 2015-11-15 DIAGNOSIS — O0990 Supervision of high risk pregnancy, unspecified, unspecified trimester: Secondary | ICD-10-CM | POA: Insufficient documentation

## 2015-11-15 DIAGNOSIS — Z3491 Encounter for supervision of normal pregnancy, unspecified, first trimester: Secondary | ICD-10-CM

## 2015-11-15 DIAGNOSIS — F419 Anxiety disorder, unspecified: Secondary | ICD-10-CM

## 2015-11-15 DIAGNOSIS — O99331 Smoking (tobacco) complicating pregnancy, first trimester: Secondary | ICD-10-CM | POA: Diagnosis not present

## 2015-11-15 DIAGNOSIS — Z331 Pregnant state, incidental: Secondary | ICD-10-CM

## 2015-11-15 DIAGNOSIS — G43409 Hemiplegic migraine, not intractable, without status migrainosus: Secondary | ICD-10-CM

## 2015-11-15 DIAGNOSIS — F17200 Nicotine dependence, unspecified, uncomplicated: Secondary | ICD-10-CM

## 2015-11-15 DIAGNOSIS — Z3682 Encounter for antenatal screening for nuchal translucency: Secondary | ICD-10-CM

## 2015-11-15 DIAGNOSIS — O43891 Other placental disorders, first trimester: Secondary | ICD-10-CM | POA: Diagnosis not present

## 2015-11-15 LAB — POCT URINALYSIS DIPSTICK
Glucose, UA: NEGATIVE
Ketones, UA: NEGATIVE
Nitrite, UA: NEGATIVE
Protein, UA: NEGATIVE

## 2015-11-15 NOTE — Progress Notes (Signed)
Subjective:  Meagan [name] is a 25 y.o. G40P2002 Caucasian female at [redacted]w[redacted]d by 7wk u/s, being seen today for her first obstetrical visit.  Her obstetrical history is significant for (1) smoker- 1ppd, (2) h/o term SVB x 2, (3) h/o pre-e x 2, (4) anxiety w/ panic attacks currently taking xanax 0.25mg  QID rx'd by PCP- used to have counseling by [facility], has contacted them to get this restarted, (5)[facility] w/ this pregnancy.  [facility] rx'd Lexapro on 5/8 to begin taking in hopes of weaning off Xanax- hasn't started- states she is always scared to start new medicine d/t potential side effects, but will do what we think is best. Takes Pamelor (nortriptyline) for hemiplegic migraines. Pregnancy history fully reviewed.  Patient reports some nausea, occ vomiting- requests meds. Has zofran rx from ED that she has only taken once.  Denies vb, cramping, uti s/s, abnormal/malodorous vag d/c, or vulvovaginal itching/irritation.  BP 112/60 mmHg  Pulse 64  Wt 161 lb (73.029 kg)  LMP 07/19/2015  HISTORY: OB History  Gravida Para Term Preterm AB SAB TAB Ectopic Multiple Living  0 0 0 0 0 0 2    # Outcome Date GA Lbr Len/2nd Weight Sex Delivery Anes PTL Lv  3 Current           2 Term 02/13/14 [redacted]w[redacted]d 03:14 / 00:05 6 lb 12.1 oz (3.065 kg) F Vag-Spont EPI N Y  1 Term 05/30/12 [redacted]w[redacted]d 15:31 / 04:14 7 lb 14 oz (3.572 kg) M Vag-Spont EPI N Y     Past Medical History  Diagnosis Date  . Anxiety   . Mental disorder     huffed inhalants "affected brain"  . Stomach ulcer   . Diarrhea 06/04/2013  . Sore throat 06/04/2013  . Nausea 06/04/2013  . Pregnant 06/04/2013  . Kidney stones   . Pyelonephritis complicating pregnancy, antepartum, first trimester 11/05/2011  . Asthma     inhaler at bedside  . Hemiplegic migraine     blurred vision, nausea, dizziness, numbness  . Daily headache   . Pregnant 10/17/2015   Past Surgical History  Procedure Laterality Date  . No past surgery     Family History   Problem Relation Age of Onset  . Cancer Maternal Grandmother     breast  . Cancer Paternal Grandfather     lung cancer  . Hypertension Other   . Migraines Father   . Other Son     ventricular defect    Exam   System:     General: Well developed & nourished, no acute distress   Skin: Warm & dry, normal coloration and turgor, no rashes   Neurologic: Alert & oriented, normal mood   Cardiovascular: Regular rate & rhythm   Respiratory: Effort & rate normal, LCTAB, acyanotic   Abdomen: Soft, non tender   Extremities: normal strength, tone   Pelvic Exam:    Perineum: Normal perineum   Vulva: Normal, no lesions   Vagina:  Normal mucosa, normal discharge   Cervix: Normal, bulbous, appears closed   Uterus: Normal size/shape/contour for GA   Thin prep pap smear obtained w/ reflex high risk HPV cotesting   Assessment:   Pregnancy: [id number] Patient Active Problem List   Diagnosis Date Noted  . Supervision of normal pregnancy 11/15/2015    Priority: High  . Smoker 12/16/2013    Priority: High  . Anxiety 06/17/2013    Priority: High  . 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 Gestational hypertension  02/12/2014    6563w1d [id number] New OB visit Smoker H/O Pre-e Anxiety/Panic attacks, on Xanax [facility] Rh- Varicella non imm  Plan:  Initial labs reviewed Continue prenatal vitamins Problem list reviewed and updated Reviewed n/v relief measures and warning s/s to report Stop zofran, Rx diclegis, prior auth approved through [employer] today Reviewed recommended weight gain based on pre-gravid BMI Encouraged well-balanced diet Genetic Screening discussed Integrated Screen: requested Cystic fibrosis screening discussed declined Ultrasound discussed; fetal survey: requested Follow up in 4 weeks for 1st nt/it and visit [facility] completed [facility]already [facility] w/ them) To begin Lexapro 10mg  daily as previously rx'd by [facility], when begins to notice improvement,  begin to wean Xanax Discussed Buspar- was previously rx'd in last pregnancy but never started- will see how Lexapro does Baby ASA daily @ 12wks d/t h/o pre-e  Smokes 1/day, advised cessation, discussed risks to fetus while pregnant, to infant pp, and to herself. Offered [employer], declined.     Marge DuncansBooker, Kimberly Randall CNM, [facility]NP-BC 11/15/2015 4:27 PM

## 2015-11-15 NOTE — Patient Instructions (Addendum)
Stop taking the zofran  Start taking Lexapro  Begin taking a  baby aspirin daily at 12 weeks of pregnancy (6/12) to decrease risk of preeclampsia during pregnancy   [name] and get appointment   For Headaches:   Stay well hydrated, drink enough water so that your urine is clear, sometimes if you are dehydrated you can get headaches  Eat small frequent meals and snacks, sometimes if you are hungry you can get headaches  Sometimes you get headaches during pregnancy from the pregnancy hormones  You can try tylenol (1-2 regular strength  or 1-2 extra strength ) as directed on the box. The least amount of medication that works is best.   Cool compresses (cool wet washcloth or ice pack) to area of head that is hurting  You can also try drinking a caffeinated drink to see if this will help  If not helping, try below:  For Prevention of Headaches/Migraines:  CoQ10  three times daily  Vitamin B2  daily  Magnesium Oxide 400-600mg  daily  If You Get a Bad Headache/Migraine:  Benadryl    Magnesium Oxide  1 large Gatorade  2 extra strength Tylenol (1,000mg  total)  1 cup coffee or Coke  If this doesn't help please call us @ [phone]   Nausea & Vomiting  Have saltine crackers or pretzels by your bed and eat a few bites before you raise your head out of bed in the morning  Eat small frequent meals throughout the day instead of large meals  Drink plenty of fluids throughout the day to stay hydrated, just don't drink a lot of fluids with your meals.  This can make your stomach fill up faster making you feel sick  Do not brush your teeth right after you eat  Products with real ginger are good for nausea, like ginger ale and ginger hard candy Make sure it says made with real ginger!  Sucking on sour candy like lemon heads is also good for nausea  If your prenatal vitamins make you nauseated, take them at night so you will sleep through  the nausea  Sea Bands  If you feel like you need medicine for the nausea & vomiting please let us know  If you are unable to keep any fluids or food down please let us know   Constipation  Drink plenty of fluid, preferably water, throughout the day  Eat foods high in fiber such as fruits, vegetables, and grains  Exercise, such as walking, is a good way to keep your bowels regular  Drink warm fluids, especially warm prune juice, or decaf coffee  Eat a 1/2 cup of real oatmeal (not instant), 1/2 cup applesauce, and 1/2-1 cup warm prune juice every day  If needed, you may take Colace (docusate sodium) stool softener once or twice a day to help keep the stool soft. If you are pregnant, wait until you are out of your first trimester (12-14 weeks of pregnancy)  If you still are having problems with constipation, you may take Miralax once daily as needed to help keep your bowels regular.  If you are pregnant, wait until you are out of your first trimester (12-14 weeks of pregnancy)   First Trimester of Pregnancy The first trimester of pregnancy is from week 1 until the end of week 12 (months 1 through 3). A week after a sperm fertilizes an egg, the egg will implant on the wall of the uterus. This embryo will begin to develop into a  baby. Genes from you and your partner are forming the baby. The female genes determine whether the baby is a boy or a girl. At 6-8 weeks, the eyes and face are formed, and the heartbeat can be seen on ultrasound. At the end of 12 weeks, all the baby's organs are formed.  Now that you are pregnant, you will want to do everything you can to have a healthy baby. Two of the most important things are to get good prenatal care and to follow your health care provider's instructions. Prenatal care is all the medical care you receive before the baby's birth. This care will help prevent, find, and treat any problems during the pregnancy and childbirth. BODY CHANGES Your body goes  through many changes during pregnancy. The changes vary from woman to woman.   You may gain or lose a couple of pounds at first.  You may feel sick to your stomach (nauseous) and throw up (vomit). If the vomiting is uncontrollable, call your health care provider.  You may tire easily.  You may develop headaches that can be relieved by medicines approved by your health care provider.  You may urinate more often. Painful urination may mean you have a bladder infection.  You may develop heartburn as a result of your pregnancy.  You may develop constipation because certain hormones are causing the muscles that push waste through your intestines to slow down.  You may develop hemorrhoids or swollen, bulging veins (varicose veins).  Your breasts may begin to grow larger and become tender. Your nipples may stick out more, and the tissue that surrounds them (areola) may become darker.  Your gums may bleed and may be sensitive to brushing and flossing.  Dark spots or blotches (chloasma, mask of pregnancy) may develop on your face. This will likely fade after the baby is born.  Your menstrual periods will stop.  You may have a loss of appetite.  You may develop cravings for certain kinds of food.  You may have changes in your emotions from day to day, such as being excited to be pregnant or being concerned that something may go wrong with the pregnancy and baby.  You may have more vivid and strange dreams.  You may have changes in your hair. These can include thickening of your hair, rapid growth, and changes in texture. Some women also have hair loss during or after pregnancy, or hair that feels dry or thin. Your hair will most likely return to normal after your baby is born. WHAT TO EXPECT AT YOUR PRENATAL VISITS During a routine prenatal visit:  You will be weighed to make sure you and the baby are growing normally.  Your blood pressure will be taken.  Your abdomen will be measured  to track your baby's growth.  The fetal heartbeat will be listened to starting around week 10 or 12 of your pregnancy.  Test results from any previous visits will be discussed. Your health care provider may ask you:  How you are feeling.  If you are feeling the baby move.  If you have had any abnormal symptoms, such as leaking fluid, bleeding, severe headaches, or abdominal cramping.  If you have any questions. Other tests that may be performed during your first trimester include:  Blood tests to find your blood type and to check for the presence of any previous infections. They will also be used to check for low iron levels (anemia) and Rh antibodies. Later in the pregnancy, blood tests  for diabetes will be done along with other tests if problems develop.  Urine tests to check for infections, diabetes, or protein in the urine.  An ultrasound to confirm the proper growth and development of the baby.  An amniocentesis to check for possible genetic problems.  Fetal screens for spina bifida and Down syndrome.  You may need other tests to make sure you and the baby are doing well. HOME CARE INSTRUCTIONS  Medicines  Follow your health care provider's instructions regarding medicine use. Specific medicines may be either safe or unsafe to take during pregnancy.  Take your prenatal vitamins as directed.  If you develop constipation, try taking a stool softener if your health care provider approves. Diet  Eat regular, well-balanced meals. Choose a variety of foods, such as meat or vegetable-based protein, fish, milk and low-fat dairy products, vegetables, fruits, and whole grain breads and cereals. Your health care provider will help you determine the amount of weight gain that is right for you.  Avoid raw meat and uncooked cheese. These carry germs that can cause birth defects in the baby.  Eating four or five small meals rather than three large meals a day may help relieve nausea  and vomiting. If you start to feel nauseous, eating a few soda crackers can be helpful. Drinking liquids between meals instead of during meals also seems to help nausea and vomiting.  If you develop constipation, eat more high-fiber foods, such as fresh vegetables or fruit and whole grains. Drink enough fluids to keep your urine clear or pale yellow. Activity and Exercise  Exercise only as directed by your health care provider. Exercising will help you:  Control your weight.  Stay in shape.  Be prepared for labor and delivery.  Experiencing pain or cramping in the lower abdomen or low back is a good sign that you should stop exercising. Check with your health care provider before continuing normal exercises.  Try to avoid standing for long periods of time. Move your legs often if you must stand in one place for a long time.  Avoid heavy lifting.  Wear low-heeled shoes, and practice good posture.  You may continue to have sex unless your health care provider directs you otherwise. Relief of Pain or Discomfort  Wear a good support bra for breast tenderness.   Take warm sitz baths to soothe any pain or discomfort caused by hemorrhoids. Use hemorrhoid cream if your health care provider approves.   Rest with your legs elevated if you have leg cramps or low back pain.  If you develop varicose veins in your legs, wear support hose. Elevate your feet for 15 minutes, 3-4 times a day. Limit salt in your diet. Prenatal Care  Schedule your prenatal visits by the twelfth week of pregnancy. They are usually scheduled monthly at first, then more often in the last 2 months before delivery.  Write down your questions. Take them to your prenatal visits.  Keep all your prenatal visits as directed by your health care provider. Safety  Wear your seat belt at all times when driving.  Make a list of emergency phone numbers, including numbers for family, friends, the hospital, and police and fire  departments. General Tips  Ask your health care provider for a referral to a local prenatal education class. Begin classes no later than at the beginning of month 6 of your pregnancy.  Ask for help if you have counseling or nutritional needs during pregnancy. Your health care provider can offer  advice or refer you to specialists for help with various needs.  Do not use hot tubs, steam rooms, or saunas.  Do not douche or use tampons or scented sanitary pads.  Do not cross your legs for long periods of time.  Avoid cat litter boxes and soil used by cats. These carry germs that can cause birth defects in the baby and possibly loss of the fetus by miscarriage or stillbirth.  Avoid all smoking, herbs, alcohol, and medicines not prescribed by your health care provider. Chemicals in these affect the formation and growth of the baby.  Schedule a dentist appointment. At home, brush your teeth with a soft toothbrush and be gentle when you floss. SEEK MEDICAL CARE IF:   You have dizziness.  You have mild pelvic cramps, pelvic pressure, or nagging pain in the abdominal area.  You have persistent nausea, vomiting, or diarrhea.  You have a bad smelling vaginal discharge.  You have pain with urination.  You notice increased swelling in your face, hands, legs, or ankles. SEEK IMMEDIATE MEDICAL CARE IF:   You have a fever.  You are leaking fluid from your vagina.  You have spotting or bleeding from your vagina.  You have severe abdominal cramping or pain.  You have rapid weight gain or loss.  You vomit blood or material that looks like coffee grounds.  You are exposed to [place] measles and have never had them.  You are exposed to fifth disease or chickenpox.  You develop a severe headache.  You have shortness of breath.  You have any kind of trauma, such as from a fall or a car accident. Document Released: 06/12/2001 Document Revised: 11/02/2013 Document Reviewed:  04/28/2013 [employer] [facility] Patient Information 2015 [place]. This information is not intended to replace advice given to you by your health care provider. Make sure you discuss any questions you have with your health care provider.

## 2015-11-16 ENCOUNTER — Telehealth: Payer: Self-pay | Admitting: Women's Health

## 2015-11-16 DIAGNOSIS — O09299 Supervision of pregnancy with other poor reproductive or obstetric history, unspecified trimester: Secondary | ICD-10-CM | POA: Insufficient documentation

## 2015-11-16 MED ORDER — DOXYLAMINE-PYRIDOXINE 10-10 MG PO TBEC: DELAYED_RELEASE_TABLET | ORAL | Status: DC

## 2015-11-17 LAB — CYTOLOGY - PAP

## 2015-11-29 ENCOUNTER — Telehealth: Payer: Self-pay | Admitting: Women's Health

## 2015-11-29 NOTE — Telephone Encounter (Signed)
Pt states she was prescribed Lexapro by Dr. Emelda FearFerguson, concerned about adding another antidepressant. Pt states has a scheduled an appt for 12/14/2015 for ultrasound and Dr.Eure, would like to discuss concerns at that time. Pt informed can discuss her concerns/questions with Dr.Eure in regards to her anxiety medications.

## 2015-12-06 ENCOUNTER — Telehealth: Payer: Self-pay | Admitting: *Deleted

## 2015-12-06 ENCOUNTER — Ambulatory Visit (INDEPENDENT_AMBULATORY_CARE_PROVIDER_SITE_OTHER): Payer: Medicaid Other | Admitting: Adult Health

## 2015-12-06 ENCOUNTER — Encounter: Payer: Self-pay | Admitting: Adult Health

## 2015-12-06 VITALS — BP 112/56 | HR 114 | Wt 159.0 lb

## 2015-12-06 DIAGNOSIS — Z331 Pregnant state, incidental: Secondary | ICD-10-CM | POA: Diagnosis not present

## 2015-12-06 DIAGNOSIS — Z1389 Encounter for screening for other disorder: Secondary | ICD-10-CM

## 2015-12-06 DIAGNOSIS — R42 Dizziness and giddiness: Secondary | ICD-10-CM

## 2015-12-06 DIAGNOSIS — R197 Diarrhea, unspecified: Secondary | ICD-10-CM | POA: Diagnosis not present

## 2015-12-06 DIAGNOSIS — R109 Unspecified abdominal pain: Secondary | ICD-10-CM | POA: Insufficient documentation

## 2015-12-06 DIAGNOSIS — R112 Nausea with vomiting, unspecified: Secondary | ICD-10-CM

## 2015-12-06 DIAGNOSIS — R252 Cramp and spasm: Secondary | ICD-10-CM

## 2015-12-06 DIAGNOSIS — O219 Vomiting of pregnancy, unspecified: Secondary | ICD-10-CM | POA: Insufficient documentation

## 2015-12-06 HISTORY — DX: Unspecified abdominal pain: R10.9

## 2015-12-06 LAB — POCT URINALYSIS DIPSTICK
Glucose, UA: NEGATIVE
Ketones, UA: NEGATIVE
Leukocytes, UA: NEGATIVE
Nitrite, UA: NEGATIVE
Protein, UA: NEGATIVE

## 2015-12-06 NOTE — Telephone Encounter (Signed)
Pt states was laying in bed this am, c/o cramping, sweats, lost hearing in both ears, feeling faint. Pt states she feels "like something's not right. Per Milda SmartKim Booker,CNM pt will need to be seen for evaluation. Pt given an appt today with Genelle GatherJennifer Griffin,NP.

## 2015-12-06 NOTE — Progress Notes (Signed)
Pt worked in today for abdominal pain/cramping and dizziness.

## 2015-12-06 NOTE — Patient Instructions (Signed)
Follow up as scheduled Sip on fluids and crakers Take diclegis and imodium

## 2015-12-06 NOTE — Progress Notes (Signed)
[id number] 648w1d Estimated Date of Delivery: 06/25/16  Blood pressure 112/56, pulse 114, weight 159 lb (72.122 kg), last menstrual period 07/19/2015, not currently breastfeeding.   BP weight and urine results all reviewed and noted.  Please refer to the obstetrical flow sheet for the fundal height and fetal heart rate documentation:  Patient  denies any bleeding and no rupture of membranes symptoms or regular contractions. Patient complains of abdominal; cramps and diarrhea, and was dizzy, try imodium  and sip on fluids and crackers, has nausea and vomiting, has diclegis, but not taking, told to take All questions were answered.  Orders Placed This Encounter  Procedures  . POCT urinalysis dipstick    Plan:  Continued routine obstetrical care,  Keep next week as scheduled

## 2015-12-08 ENCOUNTER — Ambulatory Visit (INDEPENDENT_AMBULATORY_CARE_PROVIDER_SITE_OTHER): Payer: Medicaid Other | Admitting: Obstetrics and Gynecology

## 2015-12-08 ENCOUNTER — Encounter: Payer: Self-pay | Admitting: Obstetrics and Gynecology

## 2015-12-08 ENCOUNTER — Telehealth: Payer: Self-pay | Admitting: Obstetrics & Gynecology

## 2015-12-08 VITALS — BP 120/80 | HR 89 | Wt 160.0 lb

## 2015-12-08 DIAGNOSIS — O360110 Maternal care for anti-D [Rh] antibodies, first trimester, not applicable or unspecified: Secondary | ICD-10-CM

## 2015-12-08 DIAGNOSIS — G43401 Hemiplegic migraine, not intractable, with status migrainosus: Secondary | ICD-10-CM

## 2015-12-08 DIAGNOSIS — Z3A12 12 weeks gestation of pregnancy: Secondary | ICD-10-CM

## 2015-12-08 DIAGNOSIS — Z1389 Encounter for screening for other disorder: Secondary | ICD-10-CM

## 2015-12-08 DIAGNOSIS — Z331 Pregnant state, incidental: Secondary | ICD-10-CM | POA: Diagnosis not present

## 2015-12-08 DIAGNOSIS — Z3481 Encounter for supervision of other normal pregnancy, first trimester: Secondary | ICD-10-CM | POA: Diagnosis not present

## 2015-12-08 DIAGNOSIS — Z3491 Encounter for supervision of normal pregnancy, unspecified, first trimester: Secondary | ICD-10-CM

## 2015-12-08 LAB — POCT URINALYSIS DIPSTICK
Blood, UA: NEGATIVE
Glucose, UA: NEGATIVE
Ketones, UA: NEGATIVE
Leukocytes, UA: NEGATIVE
Nitrite, UA: NEGATIVE
Protein, UA: NEGATIVE

## 2015-12-08 MED ORDER — BUTALBITAL-APAP-CAFF-COD 50-325-40-30 MG PO CAPS: 1.0000 | ORAL_CAPSULE | ORAL | Status: DC | PRN

## 2015-12-08 NOTE — Progress Notes (Signed)
High Risk Pregnancy Diagnosis(es):   Hemiplegic Migraines , anxiety disorder  G3P2002 4635w3d Estimated Date of Delivery: 06/25/16    HPI: Work in appointment today The patient is being seen today for ongoing management of routine pregnancy complicated by diffuse anxiety disorder with patient taking Xanax 0.25 mg 4 times a day (half tablet 4 times daily) patient is resistant to beginning an SSRI and did not did her Lexapro prescription filled due to her personal logic that it would just be another medicine that she needed to wean herself from. Patient very resistant to giving up her Xanax which is category D in pregnancy. Current patient dose is relatively low.. Today she reports she had an episode of hemiplegic migraine headache today. She describes these as alternating side to side, occurring only every few once every few months that she had 1 today. There followed by visual disturbances inability to see, and post migraine there is a headache which in the past is responded to Fioricet during previous pregnancy. Patient reports good fetal movement, denies any bleeding and no rupture of membranes symptoms or regular contractions.    BP weight and urine results reviewed and noted. Blood pressure 120/80, pulse 89, weight 160 lb (72.576 kg), last menstrual period 07/19/2015, not currently breastfeeding.  Fetal Surveillance Testing today:  None Fundal Height:  Not applicable Fetal Heart rate:  146 Edema:  Negative Urinalysis: Negative protein   Questions were answered.  Lab and sonogram results have been reviewed. Comments:   Assessment:  1.  Pregnancy at 2035w3d,  Estimated Date of Delivery: 06/25/16 :                          2.  Hemiplegic migraine, will prescribe Fioricet                        3. Anxiety disorder resistant to discontinuing Xanax  Medication(s) Plans:  Fioricet   Treatment Plan:  Discuss care with Neurologist, Dr Daisy [facility]Ahearn [facility] to coordinate  care  Follow up in 2 weeks for appointment for high risk OB care,

## 2015-12-08 NOTE — Progress Notes (Signed)
Pt worked in today for severe headache, hemiplegic migraines-right sided numbness and slurred speech.

## 2015-12-08 NOTE — Telephone Encounter (Signed)
Pt c/o hemiplegic migraine symptoms include slurred speech, numbness one side of body, severe migraine. Tylenol not helping. Pt given an appt today for evaluation.

## 2015-12-09 ENCOUNTER — Telehealth: Payer: Self-pay | Admitting: Obstetrics and Gynecology

## 2015-12-09 NOTE — Telephone Encounter (Signed)
[facility] back [facility] and pharmacy don't have Fioricet. Pt is requesting something that will be covered by Medicaid. Thanks!! JSY

## 2015-12-09 NOTE — Telephone Encounter (Signed)
Spoke with pt. Pt was seen yesterday and was prescribed Fioricet capsules. Pt states no pharmacy has med(in 30 mg). Pt is requesting Fioricet in tablet form. She states Medicaid covers it in tablet form but not capsule form. Please advise. Thanks!! JSY

## 2015-12-11 ENCOUNTER — Other Ambulatory Visit: Payer: Self-pay | Admitting: Obstetrics and Gynecology

## 2015-12-12 ENCOUNTER — Telehealth: Payer: Self-pay | Admitting: *Deleted

## 2015-12-12 ENCOUNTER — Other Ambulatory Visit: Payer: Self-pay | Admitting: Obstetrics and Gynecology

## 2015-12-12 MED ORDER — BUTALBITAL-APAP-CAFFEINE 50-325-40 MG PO TABS: 1.0000 | ORAL_TABLET | Freq: Four times a day (QID) | ORAL | Status: DC | PRN

## 2015-12-12 NOTE — Telephone Encounter (Signed)
Unable to leave message for pt due to mailbox being full. Rx was faxed to her pharmacy.

## 2015-12-12 NOTE — Telephone Encounter (Signed)
Rx butalbital without codeine called in. And escribed.

## 2015-12-12 NOTE — Telephone Encounter (Signed)
rx butalbital without codeine called in . Covered by Medicaid.

## 2015-12-14 ENCOUNTER — Encounter: Payer: Medicaid Other | Admitting: Women's Health

## 2015-12-14 ENCOUNTER — Encounter: Payer: Self-pay | Admitting: Obstetrics & Gynecology

## 2015-12-14 ENCOUNTER — Telehealth: Payer: Self-pay | Admitting: *Deleted

## 2015-12-14 ENCOUNTER — Ambulatory Visit (INDEPENDENT_AMBULATORY_CARE_PROVIDER_SITE_OTHER): Payer: Medicaid Other | Admitting: Obstetrics & Gynecology

## 2015-12-14 ENCOUNTER — Ambulatory Visit (INDEPENDENT_AMBULATORY_CARE_PROVIDER_SITE_OTHER): Payer: Medicaid Other

## 2015-12-14 VITALS — BP 110/70 | HR 90 | Wt 159.0 lb

## 2015-12-14 DIAGNOSIS — O4691 Antepartum hemorrhage, unspecified, first trimester: Secondary | ICD-10-CM | POA: Diagnosis not present

## 2015-12-14 DIAGNOSIS — Z3682 Encounter for antenatal screening for nuchal translucency: Secondary | ICD-10-CM

## 2015-12-14 DIAGNOSIS — Z3481 Encounter for supervision of other normal pregnancy, first trimester: Secondary | ICD-10-CM | POA: Diagnosis not present

## 2015-12-14 DIAGNOSIS — Z3491 Encounter for supervision of normal pregnancy, unspecified, first trimester: Secondary | ICD-10-CM

## 2015-12-14 DIAGNOSIS — O43891 Other placental disorders, first trimester: Secondary | ICD-10-CM | POA: Diagnosis not present

## 2015-12-14 DIAGNOSIS — O360110 Maternal care for anti-D [Rh] antibodies, first trimester, not applicable or unspecified: Secondary | ICD-10-CM

## 2015-12-14 DIAGNOSIS — Z331 Pregnant state, incidental: Secondary | ICD-10-CM | POA: Diagnosis not present

## 2015-12-14 DIAGNOSIS — Z36 Encounter for antenatal screening of mother: Secondary | ICD-10-CM | POA: Diagnosis not present

## 2015-12-14 DIAGNOSIS — Z3A13 13 weeks gestation of pregnancy: Secondary | ICD-10-CM | POA: Diagnosis not present

## 2015-12-14 DIAGNOSIS — Z1389 Encounter for screening for other disorder: Secondary | ICD-10-CM

## 2015-12-14 LAB — POCT URINALYSIS DIPSTICK
Blood, UA: NEGATIVE
Glucose, UA: NEGATIVE
Ketones, UA: NEGATIVE
Leukocytes, UA: NEGATIVE
Nitrite, UA: NEGATIVE
Protein, UA: NEGATIVE

## 2015-12-14 NOTE — Progress Notes (Signed)
US 12+2 wks,normal ov's bilat,crl 65.1 mm,NB present,NT 1.6 mm,fhr 164 bpm,subchorionic hemorrhage 2.7 x 1.8 x 2 cm no significant change

## 2015-12-14 NOTE — Progress Notes (Signed)
[id number] 8655w2d Estimated Date of Delivery: 06/25/16  Blood pressure 110/70, pulse 90, weight 159 lb (72.122 kg), last menstrual period 07/19/2015, not currently breastfeeding.   BP weight and urine results all reviewed and noted.  Please refer to the obstetrical flow sheet for the fundal height and fetal heart rate documentation:  Patient reports good fetal movement, denies any bleeding and no rupture of membranes symptoms or regular contractions. Patient is without complaints. All questions were answered.  Orders Placed This Encounter  Procedures  . Maternal Screen, Integrated #1  . POCT urinalysis dipstick    Plan:  Continued routine obstetrical care,   Sonogram is normal NT, small subchorionic hemorrhage  No Follow-up on file.

## 2015-12-14 NOTE — Telephone Encounter (Signed)
Pt states taking baby Aspirin daily, pt c/o stomach ulcer and is requesting Rx for antiacid that would be safe for her to take with Aspirin. Please advise.

## 2015-12-15 ENCOUNTER — Encounter: Payer: Self-pay | Admitting: Neurology

## 2015-12-15 ENCOUNTER — Ambulatory Visit (INDEPENDENT_AMBULATORY_CARE_PROVIDER_SITE_OTHER): Payer: Medicaid Other | Admitting: Neurology

## 2015-12-15 ENCOUNTER — Telehealth: Payer: Self-pay | Admitting: Obstetrics & Gynecology

## 2015-12-15 VITALS — BP 104/65 | HR 101 | Ht 62.0 in | Wt 159.4 lb

## 2015-12-15 DIAGNOSIS — G43409 Hemiplegic migraine, not intractable, without status migrainosus: Secondary | ICD-10-CM

## 2015-12-15 MED ORDER — OMEPRAZOLE 20 MG PO CPDR: 20.0000 mg | DELAYED_RELEASE_CAPSULE | Freq: Every day | ORAL | Status: DC

## 2015-12-15 MED ORDER — NORTRIPTYLINE HCL 10 MG PO CAPS: 40.0000 mg | ORAL_CAPSULE | Freq: Every day | ORAL | Status: DC
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
## 2015-12-15 NOTE — Patient Instructions (Addendum)
Remember to drink plenty of fluid, eat healthy meals and do not skip any meals. Try to eat protein with a every meal and eat a healthy snack such as fruit or nuts in between meals. Try to keep a regular sleep-wake schedule and try to exercise daily, particularly in the form of walking, 20-30 minutes a day, if you can.   As far as your medications are concerned, I would like to suggest: Increase Nortriptyline to  at night then  at night. Email me if you want a  pill.  I would like to see you back after pregnancy, sooner if we need to. Please call us with any interim questions, concerns, problems, updates or refill requests.   Our phone number is [phone]. We also have an after hours call service for urgent matters and there is a physician on-call for urgent questions. For any emergencies you know to call 911 or go to the nearest emergency room  To prevent or relieve headaches, try the following: Cool Compress. Lie down and place a cool compress on your head.  Avoid headache triggers. If certain foods or odors seem to have triggered your migraines in the past, avoid them. A headache diary might help you identify triggers.  Include physical activity in your daily routine. Try a daily walk or other moderate aerobic exercise.  Manage stress. Find healthy ways to cope with the stressors, such as delegating tasks on your to-do list.  Practice relaxation techniques. Try deep breathing, yoga, massage and visualization.  Eat regularly. Eating regularly scheduled meals and maintaining a healthy diet might help prevent headaches. Also, drink plenty of fluids.  Follow a regular sleep schedule. Sleep deprivation might contribute to headaches Consider biofeedback. With this mind-body technique, you learn to control certain bodily functions - such as muscle tension, heart rate and blood pressure - to prevent headaches or reduce headache pain.    Proceed to emergency room if you experience new or  worsening symptoms or symptoms do not resolve, if you have new neurologic symptoms or if headache is severe, or for any concerning symptom.     Migraine Headache A migraine headache is an intense, throbbing pain on one or both sides of your head. A migraine can last for 30 minutes to several hours. CAUSES  The exact cause of a migraine headache is not always known. However, a migraine may be caused when nerves in the brain become irritated and release chemicals that cause inflammation. This causes pain. Certain things may also trigger migraines, such as:  Alcohol.  Smoking.  Stress.  Menstruation.  Aged cheeses.  Foods or drinks that contain nitrates, glutamate, aspartame, or tyramine.  Lack of sleep.  Chocolate.  Caffeine.  Hunger.  Physical exertion.  Fatigue.  Medicines used to treat chest pain (nitroglycerine), birth control pills, estrogen, and some blood pressure medicines. SIGNS AND SYMPTOMS  Pain on one or both sides of your head.  Pulsating or throbbing pain.  Severe pain that prevents daily activities.  Pain that is aggravated by any physical activity.  Nausea, vomiting, or both.  Dizziness.  Pain with exposure to bright lights, loud noises, or activity.  General sensitivity to bright lights, loud noises, or smells. Before you get a migraine, you may get warning signs that a migraine is coming (aura). An aura may include:  Seeing flashing lights.  Seeing bright spots, halos, or zigzag lines.  Having tunnel vision or blurred vision.  Having feelings of numbness or tingling.  Having trouble talking.  Having muscle weakness. DIAGNOSIS  A migraine headache is often diagnosed based on:  Symptoms.  Physical exam.  A CT scan or MRI of your head. These imaging tests cannot diagnose migraines, but they can help rule out other causes of headaches. TREATMENT Medicines may be given for pain and nausea. Medicines can also be given to help prevent  recurrent migraines.  HOME CARE INSTRUCTIONS  Only take over-the-counter or prescription medicines for pain or discomfort as directed by your health care provider. The use of long-term narcotics is not recommended.  Lie down in a dark, quiet room when you have a migraine.  Keep a journal to find out what may trigger your migraine headaches. For example, write down:  What you eat and drink.  How much sleep you get.  Any change to your diet or medicines.  Limit alcohol consumption.  Quit smoking if you smoke.  Get 7-9 hours of sleep, or as recommended by your health care provider.  Limit stress.  Keep lights dim if bright lights bother you and make your migraines worse. SEEK IMMEDIATE MEDICAL CARE IF:   Your migraine becomes severe.  You have a fever.  You have a stiff neck.  You have vision loss.  You have muscular weakness or loss of muscle control.  You start losing your balance or have trouble walking.  You feel faint or pass out.  You have severe symptoms that are different from your first symptoms. MAKE SURE YOU:   Understand these instructions.  Will watch your condition.  Will get help right away if you are not doing well or get worse.   This information is not intended to replace advice given to you by your health care provider. Make sure you discuss any questions you have with your health care provider.   Document Released: 06/18/2005 Document Revised: 07/09/2014 Document Reviewed: 02/23/2013 Elsevier Interactive Patient Education [employer]2016 [employer] Inc.

## 2015-12-17 LAB — MATERNAL SCREEN, INTEGRATED #1
Crown Rump Length: 65.1 mm
Gest. Age on Collection Date: 12.7 weeks
Maternal Age at EDD: 23.1 years
Nuchal Translucency (NT): 1.6 mm
Number of Fetuses: 1
PAPP-A Value: 720.6 ng/mL
Weight: 159 [lb_av]

## 2015-12-20 ENCOUNTER — Telehealth: Payer: Self-pay | Admitting: Obstetrics & Gynecology

## 2015-12-20 NOTE — Telephone Encounter (Signed)
Pt states her PCP has been prescribing Xanax 0.5 mg twice daily as needed but now that she is pregnant will need to get RX from our providers. Pt states her Xanax prescription will not be due until 12/26/2015. Pt informed when she come for her routine pnv on 12/26/2015 discuss with Dr. Emelda FearFerguson since prescription will not be covered by her insurance until this date. Pt verbalized understanding.

## 2015-12-26 ENCOUNTER — Encounter: Payer: Self-pay | Admitting: Obstetrics and Gynecology

## 2015-12-26 ENCOUNTER — Ambulatory Visit (INDEPENDENT_AMBULATORY_CARE_PROVIDER_SITE_OTHER): Payer: Medicaid Other | Admitting: Obstetrics and Gynecology

## 2015-12-26 VITALS — BP 120/76 | HR 105 | Wt 158.0 lb

## 2015-12-26 DIAGNOSIS — O0992 Supervision of high risk pregnancy, unspecified, second trimester: Secondary | ICD-10-CM | POA: Diagnosis not present

## 2015-12-26 DIAGNOSIS — O99342 Other mental disorders complicating pregnancy, second trimester: Secondary | ICD-10-CM | POA: Diagnosis not present

## 2015-12-26 DIAGNOSIS — O210 Mild hyperemesis gravidarum: Secondary | ICD-10-CM | POA: Diagnosis not present

## 2015-12-26 DIAGNOSIS — Z331 Pregnant state, incidental: Secondary | ICD-10-CM | POA: Diagnosis not present

## 2015-12-26 DIAGNOSIS — G43409 Hemiplegic migraine, not intractable, without status migrainosus: Secondary | ICD-10-CM | POA: Diagnosis not present

## 2015-12-26 DIAGNOSIS — Z3A14 14 weeks gestation of pregnancy: Secondary | ICD-10-CM

## 2015-12-26 DIAGNOSIS — Z1389 Encounter for screening for other disorder: Secondary | ICD-10-CM | POA: Diagnosis not present

## 2015-12-26 LAB — POCT URINALYSIS DIPSTICK
Blood, UA: NEGATIVE
Glucose, UA: NEGATIVE
Ketones, UA: NEGATIVE
Leukocytes, UA: NEGATIVE
Nitrite, UA: NEGATIVE
Protein, UA: NEGATIVE

## 2015-12-26 MED ORDER — ALPRAZOLAM 0.25 MG PO TABS: ORAL_TABLET | ORAL | Status: DC

## 2015-12-26 MED ORDER — ALPRAZOLAM 0.5 MG PO TABS: 0.2500 mg | ORAL_TABLET | Freq: Four times a day (QID) | ORAL | Status: DC | PRN

## 2015-12-26 MED ORDER — ESCITALOPRAM OXALATE 10 MG PO TABS: 10.0000 mg | ORAL_TABLET | Freq: Every day | ORAL | Status: DC

## 2015-12-26 MED ORDER — ALPRAZOLAM 0.25 MG PO TABS: 0.2500 mg | ORAL_TABLET | Freq: Four times a day (QID) | ORAL | Status: DC | PRN

## 2015-12-26 NOTE — Progress Notes (Signed)
Pt denies any problems or concerns at this time.  

## 2015-12-26 NOTE — Progress Notes (Signed)
Patient ID: Meagan [name], female   DOB: 02/17/1991, 25 y.o.   MRN: [id number]  [id number] 7220w0d Estimated Date of Delivery: 06/25/16 LROB  Patient reports too soon for fetal movement, denies any bleeding and no rupture of membranes symptoms or regular contractions.  Patient complaints: Pt reports she frequently experiences anxiety, for which she takes qid 0.25 mg xanax. She also takes 10 mg nortriptyline. Per pt, her anxiety is baseline, and has not worsened with this pregnancy. Pt reports her PCP prescribes these medications, but was informed the prescriptions must be written by this office since she is pregnant. Pt takes Fioricet prn for hemiplegic migraines, which she notes occur approximately q4 months. She also complains of daily nausea and emesis at night.   Blood pressure 120/76, pulse 105, weight 158 lb (71.668 kg), last menstrual period 07/19/2015, not currently breastfeeding.  refer to the ob flow sheet for FH and FHR, also BP, Wt, Urine results:notable for negative                           Physical Examination: General appearance - alert, well appearing, and in no distress                                      Abdomen - FHR 172 bpm                                                                                                                                                           Questions were answered. Assessment: LROB [id number] @ 2720w0d   Plan:   Continued routine obstetrical care Provide refill for Xanax, 0.25 mg qid will continue to discuss discontinuation with pt.  Dc nortriptyline  Rx Lexapro   F/u in 4 weeks for routine prenatal care and anatomy US     By signing my name below, I, Doreatha MartinEva Mathews, attest that this documentation has been prepared under the direction and in the presence of Tilda BurrowJohn V Seon Gaertner, MD. Electronically Signed: Doreatha MartinEva Mathews, ED Scribe. 12/26/2015. 3:35 PM.  I personally performed the services described in this documentation, which was SCRIBED in my  presence. The recorded information has been reviewed and considered accurate. It has been edited as necessary during review. Tilda BurrowFERGUSON,Esmee Fallaw V, MD  I personally performed the services described in this documentation, which was SCRIBED in my presence. The recorded information has been reviewed and considered accurate. It has been edited as necessary during review. Tilda BurrowFERGUSON,Rashia Mckesson V, MD

## 2016-01-11 ENCOUNTER — Encounter: Payer: Medicaid Other | Admitting: Women's Health

## 2016-01-17 ENCOUNTER — Other Ambulatory Visit: Payer: Self-pay | Admitting: Obstetrics & Gynecology

## 2016-01-17 DIAGNOSIS — Z1389 Encounter for screening for other disorder: Secondary | ICD-10-CM

## 2016-01-23 ENCOUNTER — Ambulatory Visit (INDEPENDENT_AMBULATORY_CARE_PROVIDER_SITE_OTHER): Payer: Medicaid Other

## 2016-01-23 ENCOUNTER — Ambulatory Visit (INDEPENDENT_AMBULATORY_CARE_PROVIDER_SITE_OTHER): Payer: Medicaid Other | Admitting: Obstetrics & Gynecology

## 2016-01-23 VITALS — BP 118/70 | HR 62 | Wt 164.0 lb

## 2016-01-23 DIAGNOSIS — Z1389 Encounter for screening for other disorder: Secondary | ICD-10-CM

## 2016-01-23 DIAGNOSIS — O0992 Supervision of high risk pregnancy, unspecified, second trimester: Secondary | ICD-10-CM | POA: Diagnosis not present

## 2016-01-23 DIAGNOSIS — O99332 Smoking (tobacco) complicating pregnancy, second trimester: Secondary | ICD-10-CM

## 2016-01-23 DIAGNOSIS — Z36 Encounter for antenatal screening of mother: Secondary | ICD-10-CM | POA: Diagnosis not present

## 2016-01-23 DIAGNOSIS — Z3492 Encounter for supervision of normal pregnancy, unspecified, second trimester: Secondary | ICD-10-CM

## 2016-01-23 DIAGNOSIS — Z369 Encounter for antenatal screening, unspecified: Secondary | ICD-10-CM

## 2016-01-23 DIAGNOSIS — Z79899 Other long term (current) drug therapy: Secondary | ICD-10-CM

## 2016-01-23 DIAGNOSIS — Z3A18 18 weeks gestation of pregnancy: Secondary | ICD-10-CM | POA: Diagnosis not present

## 2016-01-23 DIAGNOSIS — O99342 Other mental disorders complicating pregnancy, second trimester: Secondary | ICD-10-CM | POA: Diagnosis not present

## 2016-01-23 DIAGNOSIS — Z331 Pregnant state, incidental: Secondary | ICD-10-CM | POA: Diagnosis not present

## 2016-01-23 DIAGNOSIS — O360121 Maternal care for anti-D [Rh] antibodies, second trimester, fetus 1: Secondary | ICD-10-CM | POA: Diagnosis not present

## 2016-01-23 LAB — POCT URINALYSIS DIPSTICK
Glucose, UA: NEGATIVE
Ketones, UA: NEGATIVE
Leukocytes, UA: NEGATIVE
Nitrite, UA: NEGATIVE

## 2016-01-23 NOTE — Addendum Note (Signed)
Addended by: Richardson Chiquito on: 01/23/2016 11:22 AM   Modules accepted: Orders

## 2016-01-23 NOTE — Progress Notes (Signed)
Anatomy US today at 18+[redacted] weeks GA. Single female fetus in variable positions. Cervix is closed and measures 3.7 cm. Fluid is subjectively within normal limits with SVP 5.9 cm. Posterior Gr 0 placenta with no evidence of previa. Bilateral ovaries appear normal. EFW 217g which is consistent with dating by [name]. All anatomy was visualized and appears normal today.

## 2016-01-23 NOTE — Progress Notes (Signed)
[id number] [redacted]w[redacted]d Estimated Date of Delivery: 06/25/16  Blood pressure 118/70, pulse 62, weight 164 lb (74.4 kg), last menstrual period 07/19/2015, not currently breastfeeding.   BP weight and urine results all reviewed and noted.  Please refer to the obstetrical flow sheet for the fundal height and fetal heart rate documentation:  Patient reports good fetal movement, denies any bleeding and no rupture of membranes symptoms or regular contractions. Patient is without complaints. All questions were answered.  Orders Placed This Encounter  Procedures  . POCT urinalysis dipstick    Plan:  Continued routine obstetrical care, sonogram is normal, see full report  Weaning down her Xanax, from 0.5mg   4 times daily to 0.25 mg 3 times daily, encouraging to continue to wean Resistant to start the lexapro, "scared she is going to die" on the lexapro, reassured we would my=ch rather have her on lexapro than xanax, she voices understanding but on going reluctance  No Follow-up on file.

## 2016-01-25 ENCOUNTER — Other Ambulatory Visit: Payer: Self-pay | Admitting: Obstetrics and Gynecology

## 2016-01-25 LAB — MATERNAL SCREEN, INTEGRATED #2
AFP MoM: 0.66
Alpha-Fetoprotein: 26.6 ng/mL
Crown Rump Length: 65.1 mm
DIA MoM: 3.52
DIA Value: 596.5 pg/mL
Estriol, Unconjugated: 2.19 ng/mL
Gest. Age on Collection Date: 12.7 weeks
Gestational Age: 18.4 weeks
Maternal Age at EDD: 23.1 years
Nuchal Translucency (NT): 1.6 mm
Nuchal Translucency MoM: 0.98
Number of Fetuses: 1
PAPP-A MoM: 0.75
PAPP-A Value: 720.6 ng/mL
Test Results:: NEGATIVE
Weight: 159 [lb_av]
Weight: 164 [lb_av]
hCG MoM: 1.36
hCG Value: 31.1 IU/mL
uE3 MoM: 1.64

## 2016-01-27 ENCOUNTER — Telehealth: Payer: Self-pay | Admitting: Obstetrics & Gynecology

## 2016-01-30 ENCOUNTER — Encounter: Payer: Self-pay | Admitting: Obstetrics and Gynecology

## 2016-01-30 ENCOUNTER — Other Ambulatory Visit: Payer: Medicaid Other

## 2016-01-30 ENCOUNTER — Ambulatory Visit (INDEPENDENT_AMBULATORY_CARE_PROVIDER_SITE_OTHER): Payer: Medicaid Other | Admitting: Obstetrics and Gynecology

## 2016-01-30 ENCOUNTER — Encounter: Payer: Medicaid Other | Admitting: Obstetrics & Gynecology

## 2016-01-30 VITALS — BP 100/60 | HR 100 | Wt 164.0 lb

## 2016-01-30 DIAGNOSIS — Z3A19 19 weeks gestation of pregnancy: Secondary | ICD-10-CM

## 2016-01-30 DIAGNOSIS — O09292 Supervision of pregnancy with other poor reproductive or obstetric history, second trimester: Secondary | ICD-10-CM

## 2016-01-30 DIAGNOSIS — F410 Panic disorder [episodic paroxysmal anxiety] without agoraphobia: Secondary | ICD-10-CM

## 2016-01-30 DIAGNOSIS — O0992 Supervision of high risk pregnancy, unspecified, second trimester: Secondary | ICD-10-CM

## 2016-01-30 DIAGNOSIS — Z1389 Encounter for screening for other disorder: Secondary | ICD-10-CM | POA: Diagnosis not present

## 2016-01-30 DIAGNOSIS — Z331 Pregnant state, incidental: Secondary | ICD-10-CM

## 2016-01-30 DIAGNOSIS — O99342 Other mental disorders complicating pregnancy, second trimester: Secondary | ICD-10-CM | POA: Diagnosis not present

## 2016-01-30 DIAGNOSIS — Z3492 Encounter for supervision of normal pregnancy, unspecified, second trimester: Secondary | ICD-10-CM

## 2016-01-30 LAB — POCT URINALYSIS DIPSTICK
Blood, UA: NEGATIVE
Glucose, UA: NEGATIVE
Ketones, UA: NEGATIVE
Leukocytes, UA: NEGATIVE
Nitrite, UA: NEGATIVE
Protein, UA: NEGATIVE

## 2016-01-30 MED ORDER — ALPRAZOLAM 0.25 MG PO TABS: 0.2500 mg | ORAL_TABLET | Freq: Three times a day (TID) | ORAL | 0 refills | Status: DC | PRN

## 2016-01-30 MED ORDER — ESCITALOPRAM OXALATE 10 MG PO TABS: 10.0000 mg | ORAL_TABLET | Freq: Every day | ORAL | 2 refills | Status: DC

## 2016-01-30 NOTE — Telephone Encounter (Signed)
Needs appt. Xanax was discussed at last visit, must be rx'd q month, cannot have automatic refils.

## 2016-01-30 NOTE — Progress Notes (Signed)
Pt states that she has a lot of back pain, she states that it's really sore.
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
## 2016-02-16 ENCOUNTER — Encounter (HOSPITAL_COMMUNITY): Payer: Self-pay | Admitting: *Deleted

## 2016-02-16 ENCOUNTER — Inpatient Hospital Stay (HOSPITAL_COMMUNITY)
Admission: AD | Admit: 2016-02-16 | Discharge: 2016-02-16 | Disposition: A | Discharge status: Home / Self Care | Payer: Medicaid Other | Source: Ambulatory Visit | Attending: Obstetrics and Gynecology | Admitting: Obstetrics and Gynecology

## 2016-02-16 ENCOUNTER — Other Ambulatory Visit: Payer: Self-pay

## 2016-02-16 DIAGNOSIS — O99332 Smoking (tobacco) complicating pregnancy, second trimester: Secondary | ICD-10-CM | POA: Diagnosis not present

## 2016-02-16 DIAGNOSIS — O99512 Diseases of the respiratory system complicating pregnancy, second trimester: Secondary | ICD-10-CM | POA: Diagnosis not present

## 2016-02-16 DIAGNOSIS — O26892 Other specified pregnancy related conditions, second trimester: Secondary | ICD-10-CM | POA: Insufficient documentation

## 2016-02-16 DIAGNOSIS — R002 Palpitations: Secondary | ICD-10-CM

## 2016-02-16 DIAGNOSIS — Z3A21 21 weeks gestation of pregnancy: Secondary | ICD-10-CM | POA: Insufficient documentation

## 2016-02-16 DIAGNOSIS — Z3492 Encounter for supervision of normal pregnancy, unspecified, second trimester: Secondary | ICD-10-CM

## 2016-02-16 DIAGNOSIS — J45909 Unspecified asthma, uncomplicated: Secondary | ICD-10-CM | POA: Diagnosis not present

## 2016-02-16 DIAGNOSIS — F410 Panic disorder [episodic paroxysmal anxiety] without agoraphobia: Secondary | ICD-10-CM | POA: Insufficient documentation

## 2016-02-16 DIAGNOSIS — O99342 Other mental disorders complicating pregnancy, second trimester: Secondary | ICD-10-CM | POA: Diagnosis not present

## 2016-02-16 LAB — CBC
HCT: 32.4 % — ABNORMAL LOW (ref 36.0–46.0)
Hemoglobin: 11.3 g/dL — ABNORMAL LOW (ref 12.0–15.0)
MCH: 31.7 pg (ref 26.0–34.0)
MCHC: 34.9 g/dL (ref 30.0–36.0)
MCV: 90.8 fL (ref 78.0–100.0)
Platelets: 179 10*3/uL (ref 150–400)
RBC: 3.57 MIL/uL — ABNORMAL LOW (ref 3.87–5.11)
RDW: 14.7 % (ref 11.5–15.5)
WBC: 9.7 10*3/uL (ref 4.0–10.5)

## 2016-02-16 LAB — BASIC METABOLIC PANEL
Anion gap: 6 (ref 5–15)
BUN: 8 mg/dL (ref 6–20)
CO2: 23 mmol/L (ref 22–32)
Calcium: 8.4 mg/dL — ABNORMAL LOW (ref 8.9–10.3)
Chloride: 105 mmol/L (ref 101–111)
Creatinine, Ser: 0.6 mg/dL (ref 0.44–1.00)
GFR calc Af Amer: >60 mL/min (ref >60)
GFR calc non Af Amer: >60 mL/min (ref >60)
Glucose, Bld: 105 mg/dL — ABNORMAL HIGH (ref 65–99)
Potassium: 3.5 mmol/L (ref 3.5–5.1)
Sodium: 134 mmol/L — ABNORMAL LOW (ref 135–145)

## 2016-02-16 NOTE — Discharge Instructions (Signed)
Premature Atrial Contraction Premature atrial contractions (PACs) happen when your heart beats before it has had time to fill with blood. Your heart then has to pause until it can fill with blood for the next beat. This causes the next beat to be more forceful. PACs are also called skipped heartbeats because it may feel like your heart stops for a second.  Your heart has four chambers. There are two upper chambers (atria) and two lower chambers (ventricles). All the chambers need to work together to pump blood properly. Electrical signals spread across your heart and make all the chambers beat together.The signal to beat starts in your atria. If the atria fire a bit early, you may have a PAC.  CAUSES  The cause of a PAC is often unknown. PACs are sometimes caused by heart disease or injury. RISK FACTORS PACs are more common in children and older people. Other risk factors that may trigger PACs include:  Caffeine.  Stress.  Fatigue.  Alcohol.  Smoking.  Stimulant drugs. These may be prescription or illegal drugs.  Heart disease.  Pregnancy SIGNS AND SYMPTOMS PACs are very common, especially in children and people 50 years and older. PACs do not cause dizziness, shortness of breath, or chest pain. The only symptom of a PAC is the sensation of a skipped or fluttering heartbeat.  DIAGNOSIS  Your health care provider can diagnose PAC based on the description of your symptom. Your health care provider may also:  Perform a physical exam to listen to your heart. Your heart may sound normal during this exam.  Perform tests to rule out other conditions. These tests may include an electrical tracing of your heart called electrocardiogram (ECG). You may need to wear a portable ECG machine (Holter monitor) that records your heart for 24 hours or more. TREATMENT  In most cases, PACs do not need to be treated. If you have frequent PACs that are caused by heart disease, you may be treated for the  underlying condition. HOME CARE INSTRUCTIONS  Do not use any tobacco products, including cigarettes, chewing tobacco, or electronic cigarettes. If you need help quitting, ask your health care provider.  Limit alcohol intake to no more than 1 drink per day for nonpregnant women and 2 drinks per day for men. One drink equals 12 ounces of beer, 5 ounces of wine, or 1 ounces of hard liquor.  Limit the amount of caffeine you take in.  Do not use illegal drugs.  Get at least 8 hours of sleep every night.  Find healthy ways to manage stress.  Get regular exercise. Ask your health care provider to suggest some activities that are safe for you. SEEK MEDICAL CARE IF:  You feel your heart skipping beats often (more than once a day).  Your heart skips beats and you feel dizzy, lightheaded, or very tired. SEEK IMMEDIATE MEDICAL CARE IF:   You have chest pain.  You have trouble breathing.   This information is not intended to replace advice given to you by your health care provider. Make sure you discuss any questions you have with your health care provider.   Document Released: 02/19/2014 Document Revised: 11/02/2014 Document Reviewed: 02/19/2014 Elsevier Interactive Patient Education [employer]2016 Elsevier Inc.

## 2016-02-16 NOTE — MAU Note (Signed)
Pt states she has heart palpitations, felt it was beating irregularly approx one hour ago.  Episode lasted about 20 seconds - could not breathe at that time. "I felt like it knocked the breath out of me."  Does not feel any of this right now.  States this has never happened before.  Denies abdominal pain, bleeding or LOF.

## 2016-02-16 NOTE — MAU Provider Note (Signed)
History     CSN: [id number]  Arrival date and time: 02/16/16 1729   Chief Complaint  Patient presents with  . Palpitations   25 y/o G3P2002 at 21 3/7 weeks here c/o palpitations.  Patient states walked in from car around 5pm today, sat down on couch and had palpitations lasting 20-30sec, self-limited. Has had some small episodes before lasting 1-2 seconds, usually only occurring 1-2 times per month. Denies CP, SOB, HA, lightheadness. Has h/o anxiety but states this was different than previous panic attacks which were mostly hyperventilating.     OB History    Gravida Para Term Preterm AB Living   3 2 2  0 0 2   SAB TAB Ectopic Multiple Live Births   0 0 0 0 2      Past Medical History:  Diagnosis Date  . Abdominal cramps 12/06/2015  . Anxiety   . Asthma    inhaler at bedside  . Daily headache   . Diarrhea 06/04/2013  . Hemiplegic migraine    blurred vision, nausea, dizziness, numbness  . Kidney stones   . Mental disorder    huffed inhalants "affected brain"  . Nausea 06/04/2013  . Nausea and vomiting during pregnancy 12/06/2015  . Pregnant 06/04/2013  . Pregnant 10/17/2015  . Pyelonephritis complicating pregnancy, antepartum, first trimester 11/05/2011  . Sore throat 06/04/2013  . Stomach ulcer     Past Surgical History:  Procedure Laterality Date  . no past surgery      Family History  Problem Relation Age of Onset  . Cancer Maternal Grandmother     breast  . Cancer Paternal Grandfather     lung cancer  . Migraines Father   . Other Son     ventricular defect  . Hypertension Other     Social History  Substance Use Topics  . Smoking status: Current Every Day Smoker    Packs/day: 0.50    Years: 7.00    Types: Cigarettes  . Smokeless tobacco: Never Used  . Alcohol use No     Comment: Occasionally; not now    Allergies:  Allergies  Allergen Reactions  . Codeine Nausea And Vomiting and Other (See Comments)    Vertigo  . Penicillins Rash and Other (See  Comments)    Unknown:  Childhood reaction.     Prescriptions Prior to Admission  Medication Sig Dispense Refill Last Dose  . acetaminophen (TYLENOL) 500 MG tablet Take 1,000 mg by mouth every 6 (six) hours as needed for mild pain.    Taking  . ALPRAZolam (XANAX) 0.25 MG tablet Take 1 tablet (0.25 mg total) by mouth 3 (three) times daily as needed for anxiety. Please use only three times daily when possible. 90 tablet 0   . aspirin 81 MG tablet Take 81 mg by mouth daily.   Taking  . butalbital-acetaminophen-caffeine (FIORICET) 50-325-40 MG tablet Take 1-2 tablets by mouth every 6 (six) hours as needed for headache. 20 tablet 2 Taking  . escitalopram (LEXAPRO) 10 MG tablet Take 1 tablet (10 mg total) by mouth daily. 30 tablet 2   . nortriptyline (PAMELOR) 10 MG capsule Take 4 capsules (40 mg total) by mouth at bedtime. 120 capsule 12 Taking  . omeprazole (PRILOSEC) 20 MG capsule Take 1 capsule (20 mg total) by mouth daily. 1 tablet a day 30 capsule 6 Taking  . prenatal vitamin w/FE, FA (PRENATAL 1 + 1) 27-1 MG TABS tablet Take 1 tablet by mouth daily at 12 noon. 30 each  12 Taking    ROS  REVIEW OF SYSTEMS  OPHTHALMIC: negative for - blurry vision, decreased vision, double vision, photophobia or scotomata RESPIRATORY: no cough, shortness of breath, or wheezing CARDIOVASCULAR: no chest pain or dyspnea on exertion. +palpitations GASTROINTESTINAL: no abdominal pain, change in bowel habits, or black or bloody stools negative for - epigastric or RUQ pain GENITO-URINARY: no dysuria, trouble voiding, or hematuria negative for - genital discharge, vulvar/vaginal symptoms or vaginal bleeding MUSKULOSKELETAL: negative for - gait disturbance or swelling in ankle - bilateral, foot - bilateral and leg - bilateral NEUROLOGICAL: negative for - dizziness, gait disturbance, headaches, numbness/tingling or visual changes DERMATOLOGICAL: negative OBSTETRICAL: No vaginal bleeding, no leaking fluid, no  contractions. Good fetal movement.  Physical Exam   Blood pressure 120/66, pulse 101, temperature 98 F (36.7 C), temperature source Oral, resp. rate 18, last menstrual period 07/19/2015, SpO2 98 %.  Physical Exam  Constitutional: She is oriented to person, place, and time. She appears well-developed and well-nourished.  HEENT: Non-icteric; EOMI; Normocephalic.  Cardiovascular: Normal rate, regular rhythm and normal heart sounds, no murmurs, rubs, gallops.  Pulmonary/Chest: Effort normal and breath sounds normal. CTAB. Abdominal: Soft. Non-tender. Gravid. Neurological: She is alert and oriented to person, place, and time. She has normal reflexes.  Skin: Skin is warm and dry.  Musculoskeletal: No edema. Steady gait.   Psychiatric: She has a normal mood and affect. Her behavior is normal. Judgment and thought content normal.  MAU Course  Procedures EKG: NSR   MDM Plan of care reviewed with patient, including labs and tests ordered and medical treatment.   Assessment and Plan   25 y/o G3P2002 at 21 3/7 weeks here c/o palpitations. Palpitations likely d/t PVCs vs arrhythmia, currently in NSR - CBC pending to check for anemia - BMP pending to check for electrolyte abnormalities - [facility].  Leland HerElsia J Yoo PGY-1 02/16/2016, 6:15 PM    OB FELLOW MAU DISCHARGE ATTESTATION  I have seen and examined this patient; I agree with above documentation in the resident's note. Patient care has been turned over to [employer] Cresenzo-Dishmon, CNM   CidraElizabeth Mumaw, DO [place] Fellow 6:23 PM  Labs normal, no further episodes.,  Pt instructed to let OB know if palpitations become more frequent/severe and we will schedule a cardiology consult/holter monitor.

## 2016-02-20 ENCOUNTER — Ambulatory Visit (INDEPENDENT_AMBULATORY_CARE_PROVIDER_SITE_OTHER): Payer: Medicaid Other | Admitting: Women's Health

## 2016-02-20 ENCOUNTER — Encounter: Payer: Self-pay | Admitting: Women's Health

## 2016-02-20 VITALS — BP 122/64 | HR 92 | Wt 170.0 lb

## 2016-02-20 DIAGNOSIS — O99342 Other mental disorders complicating pregnancy, second trimester: Secondary | ICD-10-CM | POA: Diagnosis not present

## 2016-02-20 DIAGNOSIS — Z3492 Encounter for supervision of normal pregnancy, unspecified, second trimester: Secondary | ICD-10-CM

## 2016-02-20 DIAGNOSIS — Z1389 Encounter for screening for other disorder: Secondary | ICD-10-CM | POA: Diagnosis not present

## 2016-02-20 DIAGNOSIS — O99332 Smoking (tobacco) complicating pregnancy, second trimester: Secondary | ICD-10-CM | POA: Diagnosis not present

## 2016-02-20 DIAGNOSIS — Z331 Pregnant state, incidental: Secondary | ICD-10-CM

## 2016-02-20 DIAGNOSIS — O0992 Supervision of high risk pregnancy, unspecified, second trimester: Secondary | ICD-10-CM | POA: Diagnosis not present

## 2016-02-20 DIAGNOSIS — Z3A22 22 weeks gestation of pregnancy: Secondary | ICD-10-CM | POA: Diagnosis not present

## 2016-02-20 LAB — POCT URINALYSIS DIPSTICK
Blood, UA: NEGATIVE
Glucose, UA: NEGATIVE
Ketones, UA: NEGATIVE
Leukocytes, UA: NEGATIVE
Nitrite, UA: NEGATIVE
Protein, UA: NEGATIVE

## 2016-02-20 NOTE — Patient Instructions (Signed)
You will have your sugar test next visit.  Please do not eat or drink anything after midnight the night before you come, not even water.  You will be here for at least two hours.     Call the office ([phone]) or go to [facility] if:  You begin to have strong, frequent contractions  Your water breaks.  Sometimes it is a big gush of fluid, sometimes it is just a trickle that keeps getting your panties wet or running down your legs  You have vaginal bleeding.  It is normal to have a small amount of spotting if your cervix was checked.   You don't feel your baby moving like normal.  If you don't, get you something to eat and drink and lay down and focus on feeling your baby move.   If your baby is still not moving like normal, you should call the office or go to [facility].  Second Trimester of Pregnancy The second trimester is from week 13 through week 28, months 4 through 6. The second trimester is often a time when you feel your best. Your body has also adjusted to being pregnant, and you begin to feel better physically. Usually, morning sickness has lessened or quit completely, you may have more energy, and you may have an increase in appetite. The second trimester is also a time when the fetus is growing rapidly. At the end of the sixth month, the fetus is about 9 inches long and weighs about 1 pounds. You will likely begin to feel the baby move (quickening) between 18 and 20 weeks of the pregnancy. BODY CHANGES Your body goes through many changes during pregnancy. The changes vary from woman to woman.   Your weight will continue to increase. You will notice your lower abdomen bulging out.  You may begin to get stretch marks on your hips, abdomen, and breasts.  You may develop headaches that can be relieved by medicines approved by your health care provider.  You may urinate more often because the fetus is pressing on your bladder.  You may develop or continue to have  heartburn as a result of your pregnancy.  You may develop constipation because certain hormones are causing the muscles that push waste through your intestines to slow down.  You may develop hemorrhoids or swollen, bulging veins (varicose veins).  You may have back pain because of the weight gain and pregnancy hormones relaxing your joints between the bones in your pelvis and as a result of a shift in weight and the muscles that support your balance.  Your breasts will continue to grow and be tender.  Your gums may bleed and may be sensitive to brushing and flossing.  Dark spots or blotches (chloasma, mask of pregnancy) may develop on your face. This will likely fade after the baby is born.  A dark line from your belly button to the pubic area (linea nigra) may appear. This will likely fade after the baby is born.  You may have changes in your hair. These can include thickening of your hair, rapid growth, and changes in texture. Some women also have hair loss during or after pregnancy, or hair that feels dry or thin. Your hair will most likely return to normal after your baby is born. WHAT TO EXPECT AT YOUR PRENATAL VISITS During a routine prenatal visit:  You will be weighed to make sure you and the fetus are growing normally.  Your blood pressure will be taken.    Your abdomen will be measured to track your baby's growth.  The fetal heartbeat will be listened to.  Any test results from the previous visit will be discussed. Your health care provider may ask you:  How you are feeling.  If you are feeling the baby move.  If you have had any abnormal symptoms, such as leaking fluid, bleeding, severe headaches, or abdominal cramping.  If you have any questions. Other tests that may be performed during your second trimester include:  Blood tests that check for:  Low iron levels (anemia).  Gestational diabetes (between 24 and 28 weeks).  Rh antibodies.  Urine tests to check  for infections, diabetes, or protein in the urine.  An ultrasound to confirm the proper growth and development of the baby.  An amniocentesis to check for possible genetic problems.  Fetal screens for spina bifida and Down syndrome. HOME CARE INSTRUCTIONS   Avoid all smoking, herbs, alcohol, and unprescribed drugs. These chemicals affect the formation and growth of the baby.  Follow your health care provider's instructions regarding medicine use. There are medicines that are either safe or unsafe to take during pregnancy.  Exercise only as directed by your health care provider. Experiencing uterine cramps is a good sign to stop exercising.  Continue to eat regular, healthy meals.  Wear a good support bra for breast tenderness.  Do not use hot tubs, steam rooms, or saunas.  Wear your seat belt at all times when driving.  Avoid raw meat, uncooked cheese, cat litter boxes, and soil used by cats. These carry germs that can cause birth defects in the baby.  Take your prenatal vitamins.  Try taking a stool softener (if your health care provider approves) if you develop constipation. Eat more high-fiber foods, such as fresh vegetables or fruit and whole grains. Drink plenty of fluids to keep your urine clear or pale yellow.  Take warm sitz baths to soothe any pain or discomfort caused by hemorrhoids. Use hemorrhoid cream if your health care provider approves.  If you develop varicose veins, wear support hose. Elevate your feet for 15 minutes, 3-4 times a day. Limit salt in your diet.  Avoid heavy lifting, wear low heel shoes, and practice good posture.  Rest with your legs elevated if you have leg cramps or low back pain.  Visit your dentist if you have not gone yet during your pregnancy. Use a soft toothbrush to brush your teeth and be gentle when you floss.  A sexual relationship may be continued unless your health care provider directs you otherwise.  Continue to go to all your  prenatal visits as directed by your health care provider. SEEK MEDICAL CARE IF:   You have dizziness.  You have mild pelvic cramps, pelvic pressure, or nagging pain in the abdominal area.  You have persistent nausea, vomiting, or diarrhea.  You have a bad smelling vaginal discharge.  You have pain with urination. SEEK IMMEDIATE MEDICAL CARE IF:   You have a fever.  You are leaking fluid from your vagina.  You have spotting or bleeding from your vagina.  You have severe abdominal cramping or pain.  You have rapid weight gain or loss.  You have shortness of breath with chest pain.  You notice sudden or extreme swelling of your face, hands, ankles, feet, or legs.  You have not felt your baby move in over an hour.  You have severe headaches that do not go away with medicine.  You have vision changes.   Document Released: 06/12/2001 Document Revised: 06/23/2013 Document Reviewed: 08/19/2012 [employer] Patient Information 2015 [employer]. This information is not intended to replace advice given to you by your health care provider. Make sure you discuss any questions you have with your health care provider.     

## 2016-02-20 NOTE — Progress Notes (Signed)
Low-risk OB appointment [id number] Estimated Date of Delivery: 06/25/16 BP 122/64   Pulse 92   Wt 170 lb (77.1 kg)   LMP 07/19/2015   BMI 31.09 kg/m   BP, weight, and urine reviewed.  Refer to obstetrical flow sheet for FH & FHR.  Reports good fm.  Denies regular uc's, lof, vb, or uti s/s. No complaints. Reviewed ptl s/s, fm. Plan:  Continue routine obstetrical care  F/U in 4wks for OB appointment and pn2

## 2016-02-27 ENCOUNTER — Ambulatory Visit (INDEPENDENT_AMBULATORY_CARE_PROVIDER_SITE_OTHER): Payer: Medicaid Other | Admitting: Obstetrics and Gynecology

## 2016-02-27 VITALS — BP 122/50 | HR 92 | Wt 172.0 lb

## 2016-02-27 DIAGNOSIS — Z331 Pregnant state, incidental: Secondary | ICD-10-CM | POA: Diagnosis not present

## 2016-02-27 DIAGNOSIS — O99332 Smoking (tobacco) complicating pregnancy, second trimester: Secondary | ICD-10-CM | POA: Diagnosis not present

## 2016-02-27 DIAGNOSIS — O99342 Other mental disorders complicating pregnancy, second trimester: Secondary | ICD-10-CM

## 2016-02-27 DIAGNOSIS — Z3A23 23 weeks gestation of pregnancy: Secondary | ICD-10-CM | POA: Diagnosis not present

## 2016-02-27 DIAGNOSIS — Z3492 Encounter for supervision of normal pregnancy, unspecified, second trimester: Secondary | ICD-10-CM

## 2016-02-27 DIAGNOSIS — Z1389 Encounter for screening for other disorder: Secondary | ICD-10-CM

## 2016-02-27 DIAGNOSIS — O0992 Supervision of high risk pregnancy, unspecified, second trimester: Secondary | ICD-10-CM | POA: Diagnosis not present

## 2016-02-27 LAB — POCT URINALYSIS DIPSTICK
Blood, UA: NEGATIVE
Glucose, UA: NEGATIVE
Ketones, UA: NEGATIVE
Leukocytes, UA: NEGATIVE
Nitrite, UA: NEGATIVE
Protein, UA: NEGATIVE

## 2016-02-27 MED ORDER — ALPRAZOLAM 0.25 MG PO TABS: 0.2500 mg | ORAL_TABLET | Freq: Three times a day (TID) | ORAL | 0 refills | Status: DC | PRN

## 2016-02-27 NOTE — Progress Notes (Signed)
Patient ID: Meagan [name], female   DOB: 08/09/1990, 25 y.o.   MRN: [id number] ° °G3P2002  Estimated Date of Delivery: 06/25/16 LROB [redacted]w[redacted]d ° °Blood pressure (!) 122/50, pulse 92, weight 172 lb (78 kg), last menstrual period 07/19/2015.   ° °Urine results: notable for none ° °Chief Complaint  °Patient presents with  °• Routine Prenatal Visit  °  xanax refill  ° ° °Patient complaints: Pt is requesting a 0.25 mg Xanax refill. She reports she takes Xanax for severe anxiety. Pt states she is doing well on her current dose of Xanax and it allows her to calm herself down. She reports she frequently has multiple daily anxiety attacks. Pt states "what ifs" and derealization events trigger her anxiety. She has previously tried Buspar with her previous pregnancy and it did not help her. Pt reports her Xanax was initially managed by a Psychiatrist before being transferred to this office when she had her last child.   ° °She also reports she is currently smoking and has questions about using 21 mg nicotine patches to quit smoking.  ° °She notes she has not yet tried Lexapro and reports she would like to see if she is able to stop taking all medications before trying it.  ° °She reports she wanted to try Nuvaring after pregnancy, but is open to other options as she plans on breastfeeding.  ° °Patient reports good fetal movement. She denies any bleeding, rupture of membranes, or regular contractions. ° ° °Refer to the ob flow sheet for FH and FHR.    °Physical Examination: General appearance - alert, well appearing, and in no distress °                                     Abdomen - FH 20 cm,  °                                                       -FHR 154 bpm °                                                        soft, nontender, nondistended, no masses or organomegaly ° ° °Discussed anxiety with pt. Discussed the risks of taking Xanax during pregnancy with pt. At end of discussion, pt had opportunity to ask questions and  has no further questions at this time.  ° °Greater than 50% was spent in counseling and coordination of care with the patient. Total time greater than: 25 minutes  ° °                                           °Questions were answered. °Assessment: LROB G3P2002 @ [redacted]w[redacted]d  °Chronic anxiety, doing well tapering dose of Xanax, desire to quit taking Xanax  ° °Plan:   °Continued routine obstetrical care °Refill Xanax  °Further discuss discontinuing Xanax at next visit  Pt to cut her pills in half and keep count of   how much she can reduce over next 4 wk  °Pt to talk to dad Next door) , to plan to have his support as she stops the xanax. ° °F/u in 4 weeks for routine prenatal care ° ° °By signing my name below, I, Eva Mathews, attest that this documentation has been prepared under the direction and in the presence of Senie Lanese V Markees Carns, MD. °Electronically Signed: Eva Mathews, ED Scribe. 02/27/16. 2:48 PM. ° °I personally performed the services described in this documentation, which was SCRIBED in my presence. The recorded information has been reviewed and considered accurate. It has been edited as necessary during review. °Berdella Bacot V, MD °\ °   °

## 2016-02-27 NOTE — Progress Notes (Addendum)
Patient ID: Meagan [name], female   DOB: [birth year], 25 y.o.   MRN: [id number]  [id number]  Estimated Date of Delivery: 06/25/16 LROB [redacted]w[redacted]d  Blood pressure (!) 122/50, pulse 92, weight 172 lb (78 kg), last menstrual period 07/19/2015.    Urine results: notable for none  Chief Complaint  Patient presents with   Routine Prenatal Visit    xanax refill    Patient complaints: Pt is requesting a 0.25 mg Xanax refill. She reports she takes Xanax for severe anxiety. Pt states she is doing well on her current dose of Xanax and it allows her to calm herself down. She reports she frequently has multiple daily anxiety attacks. Pt states "what ifs" and derealization events trigger her anxiety. She has previously tried Buspar with her previous pregnancy and it did not help her. Pt reports her Xanax was initially managed by a Psychiatrist before being transferred to this office when she had her last child.    She also reports she is currently smoking and has questions about using 21 mg nicotine patches to quit smoking.   She notes she has not yet tried Lexapro and reports she would like to see if she is able to stop taking all medications before trying it.   She reports she wanted to try Nuvaring after pregnancy, but is open to other options as she plans on breastfeeding.   Patient reports good fetal movement. She denies any bleeding, rupture of membranes, or regular contractions.   Refer to the ob flow sheet for FH and FHR.    Physical Examination: General appearance - alert, well appearing, and in no distress                                      Abdomen - FH 20 cm,                                                         -FHR 154 bpm                                                         soft, nontender, nondistended, no masses or organomegaly   Discussed anxiety with pt. Discussed the risks of taking Xanax during pregnancy with pt. At end of discussion, pt had opportunity to ask questions and  has no further questions at this time.   Greater than 50% was spent in counseling and coordination of care with the patient. Total time greater than: 25 minutes                                              Questions were answered. Assessment: LROB G3P2002 @ [redacted]w[redacted]d  Chronic anxiety, doing well tapering dose of Xanax, desire to quit taking Xanax   Plan:   Continued routine obstetrical care Refill Xanax  Further discuss discontinuing Xanax at next visit  Pt to cut her pills in half and keep count of  how much she can reduce over next 4 wk  Pt to talk to dad Next door) , to plan to have his support as she stops the xanax.  F/u in 4 weeks for routine prenatal care   By signing my name below, I, Meagan [name], attest that this documentation has been prepared under the direction and in the presence of Tilda BurrowJohn V Ferguson, MD. Electronically Signed: Doreatha MartinEva [name], ED Scribe. 02/27/16. 2:48 PM.  I personally performed the services described in this documentation, which was SCRIBED in my presence. The recorded information has been reviewed and considered accurate. It has been edited as necessary during review. Tilda BurrowFERGUSON,JOHN V, MD \

## 2016-02-29 ENCOUNTER — Telehealth: Payer: Self-pay | Admitting: *Deleted

## 2016-02-29 NOTE — Telephone Encounter (Signed)
Pt c/o severe dizzy spell x 1 this am, +FM, no vaginal bleeding. Pt states she has only had 1 episode this am, has not eaten this am. Pt informed to eat and push fluids, if no improvement to call our office back.   Pt verbalized understanding.

## 2016-03-19 ENCOUNTER — Encounter: Payer: Self-pay | Admitting: Women's Health

## 2016-03-19 ENCOUNTER — Ambulatory Visit (INDEPENDENT_AMBULATORY_CARE_PROVIDER_SITE_OTHER): Payer: Medicaid Other | Admitting: Women's Health

## 2016-03-19 VITALS — BP 112/58 | HR 110 | Wt 176.0 lb

## 2016-03-19 DIAGNOSIS — Z3A26 26 weeks gestation of pregnancy: Secondary | ICD-10-CM | POA: Diagnosis not present

## 2016-03-19 DIAGNOSIS — O99332 Smoking (tobacco) complicating pregnancy, second trimester: Secondary | ICD-10-CM | POA: Diagnosis not present

## 2016-03-19 DIAGNOSIS — Z1389 Encounter for screening for other disorder: Secondary | ICD-10-CM | POA: Diagnosis not present

## 2016-03-19 DIAGNOSIS — O99342 Other mental disorders complicating pregnancy, second trimester: Secondary | ICD-10-CM

## 2016-03-19 DIAGNOSIS — Z331 Pregnant state, incidental: Secondary | ICD-10-CM

## 2016-03-19 DIAGNOSIS — O0992 Supervision of high risk pregnancy, unspecified, second trimester: Secondary | ICD-10-CM | POA: Diagnosis not present

## 2016-03-19 DIAGNOSIS — F419 Anxiety disorder, unspecified: Secondary | ICD-10-CM

## 2016-03-19 LAB — POCT URINALYSIS DIPSTICK
Glucose, UA: NEGATIVE
Ketones, UA: NEGATIVE
Leukocytes, UA: NEGATIVE
Nitrite, UA: NEGATIVE
Protein, UA: NEGATIVE

## 2016-03-19 MED ORDER — ALPRAZOLAM 0.25 MG PO TABS: 0.2500 mg | ORAL_TABLET | Freq: Three times a day (TID) | ORAL | 0 refills | Status: DC | PRN

## 2016-03-19 NOTE — Progress Notes (Signed)
[facility]  Patient name: Meagan [name] MRN [id number]  Date of birth: 08/14/1990  CC & HPI:  Meagan [name] is a 25 y.o. 503P2002 Caucasian female at 6097w0d presenting today because she only has 4-5 Xanax left before she runs out. Taking 0.25mg  TID. Very anxious. Hard time leaving house/being around other people.  Wants room dark. Has frequent panic attacks w/ overwhelming feeling of anxiety, sweats, sob. Tried to decrease Xanax to BID as directed but unable to. Knows Xanax is not recommended during pregnancy, but has fear of trying other medicines, thinks they may kill her. Has tried multiple other medicines in past that did not work. Has not gotten back in touch with [facility]/psychiatrist as I had discussed w/ her previously- states no ride/no childcare. She also wants to quit smoking- down to 12-14 cigarettes/day- ok to try 14mg  nicotine patch- do not smoke w/ patch on. Can also call 1-800-quit-now.    Patient's last menstrual period was 07/19/2015.  Pertinent History Reviewed:  Medical & Surgical Hx:   Past medical, surgical, family, and social history reviewed in electronic medical record Medications: Reviewed & Updated - see associated section Allergies: Reviewed in electronic medical record  Objective Findings:  Vitals: BP (!) 112/58   Pulse (!) 110   Wt 176 lb (79.8 kg)   LMP 07/19/2015   BMI 32.19 kg/m  Body mass index is 32.19 kg/m.  Physical Examination: General appearance - alert, anxious, but speaking in complete sentences and appropriately  Results for orders placed or performed in visit on 03/19/16 (from the past 24 hour(s))  POCT urinalysis dipstick   Collection Time: 03/19/16  4:08 PM  Result Value Ref Range   Color, UA     Clarity, UA     Glucose, UA neg    Bilirubin, UA     Ketones, UA neg    Spec Grav, UA     Blood, UA trace    pH, UA     Protein, UA neg    Urobilinogen, UA     Nitrite, UA neg    Leukocytes, UA  Negative Negative     Assessment & Plan:  A:   4797w0d pregnant  Anxiety  P:    Discussed I am not comfortable rx'ing Xanax in pregnancy. Xanax 0.25mg  TID prn #90 w/ 0RF rx'd by [facility] today.   Make all future visits when needing refill w/ MD  Advised she earnestly try to get app[facility] to re-[facility] care w/ them.   Keep appt on Wed as scheduled for ob visit and pn2  Reviewed ptl s/s, fm  Marge DuncansBooker, [name] CNM, [facility]BC 03/19/2016 5:06 PM

## 2016-03-21 ENCOUNTER — Ambulatory Visit (INDEPENDENT_AMBULATORY_CARE_PROVIDER_SITE_OTHER): Payer: Medicaid Other | Admitting: Advanced Practice Midwife

## 2016-03-21 ENCOUNTER — Other Ambulatory Visit: Payer: Medicaid Other

## 2016-03-21 VITALS — BP 120/68 | HR 82 | Wt 176.0 lb

## 2016-03-21 DIAGNOSIS — O0992 Supervision of high risk pregnancy, unspecified, second trimester: Secondary | ICD-10-CM | POA: Diagnosis not present

## 2016-03-21 DIAGNOSIS — Z1389 Encounter for screening for other disorder: Secondary | ICD-10-CM | POA: Diagnosis not present

## 2016-03-21 DIAGNOSIS — Z3A27 27 weeks gestation of pregnancy: Secondary | ICD-10-CM

## 2016-03-21 DIAGNOSIS — Z131 Encounter for screening for diabetes mellitus: Secondary | ICD-10-CM

## 2016-03-21 DIAGNOSIS — Z3492 Encounter for supervision of normal pregnancy, unspecified, second trimester: Secondary | ICD-10-CM

## 2016-03-21 DIAGNOSIS — O99332 Smoking (tobacco) complicating pregnancy, second trimester: Secondary | ICD-10-CM

## 2016-03-21 DIAGNOSIS — O99342 Other mental disorders complicating pregnancy, second trimester: Secondary | ICD-10-CM

## 2016-03-21 DIAGNOSIS — F419 Anxiety disorder, unspecified: Secondary | ICD-10-CM

## 2016-03-21 DIAGNOSIS — Z369 Encounter for antenatal screening, unspecified: Secondary | ICD-10-CM

## 2016-03-21 DIAGNOSIS — Z331 Pregnant state, incidental: Secondary | ICD-10-CM

## 2016-03-21 LAB — POCT URINALYSIS DIPSTICK
Blood, UA: NEGATIVE
Glucose, UA: NEGATIVE
Ketones, UA: NEGATIVE
Leukocytes, UA: NEGATIVE
Nitrite, UA: NEGATIVE
Protein, UA: NEGATIVE

## 2016-03-21 NOTE — Progress Notes (Signed)
[id number] 7488w2d Estimated Date of Delivery: 06/25/16  Blood pressure 120/68, pulse 82, weight 176 lb (79.8 kg), last menstrual period 07/19/2015.   BP weight and urine results all reviewed and noted.  Please refer to the obstetrical flow sheet for the fundal height and fetal heart rate documentation:  Patient reports good fetal movement, denies any bleeding and no rupture of membranes symptoms or regular contractions.  Note from 9/18 reviewed. [facility] psychiatrist. Visibly anxious. Encouragement and praise given regarding starting therapy  All questions were answered.  Orders Placed This Encounter  Procedures  . POCT urinalysis dipstick    Plan:  Continued routine obstetrical care, PN2 today  Return in about 3 weeks (around 04/11/2016) for LROB.

## 2016-03-22 LAB — CBC
Hematocrit: 35.9 % (ref 34.0–46.6)
Hemoglobin: 12 g/dL (ref 11.1–15.9)
MCH: 31.2 pg (ref 26.6–33.0)
MCHC: 33.4 g/dL (ref 31.5–35.7)
MCV: 93 fL (ref 79–97)
Platelets: 237 10*3/uL (ref 150–379)
RBC: 3.85 x10E6/uL (ref 3.77–5.28)
RDW: 14.5 % (ref 12.3–15.4)
WBC: 8.9 10*3/uL (ref 3.4–10.8)

## 2016-03-22 LAB — ANTIBODY SCREEN: Antibody Screen: NEGATIVE

## 2016-03-22 LAB — GLUCOSE TOLERANCE, 2 HOURS W/ 1HR
Glucose, 1 hour: 114 mg/dL (ref 65–179)
Glucose, 2 hour: 105 mg/dL (ref 65–152)
Glucose, Fasting: 73 mg/dL (ref 65–91)

## 2016-03-22 LAB — RPR: RPR Ser Ql: NONREACTIVE

## 2016-03-22 LAB — HIV ANTIBODY (ROUTINE TESTING W REFLEX): HIV Screen 4th Generation wRfx: NONREACTIVE

## 2016-03-28 ENCOUNTER — Telehealth: Payer: Self-pay | Admitting: Advanced Practice Midwife

## 2016-03-28 NOTE — Telephone Encounter (Signed)
Pt informed of normal lab results

## 2016-04-08 ENCOUNTER — Emergency Department (HOSPITAL_COMMUNITY)
Admission: EM | Admit: 2016-04-08 | Discharge: 2016-04-09 | Disposition: A | Discharge status: Home / Self Care | Payer: Medicaid Other | Attending: Emergency Medicine | Admitting: Emergency Medicine

## 2016-04-08 ENCOUNTER — Encounter (HOSPITAL_COMMUNITY): Payer: Self-pay | Admitting: Emergency Medicine

## 2016-04-08 ENCOUNTER — Emergency Department (HOSPITAL_COMMUNITY): Payer: Medicaid Other

## 2016-04-08 DIAGNOSIS — O99333 Smoking (tobacco) complicating pregnancy, third trimester: Secondary | ICD-10-CM | POA: Insufficient documentation

## 2016-04-08 DIAGNOSIS — Z79899 Other long term (current) drug therapy: Secondary | ICD-10-CM | POA: Diagnosis not present

## 2016-04-08 DIAGNOSIS — Z5181 Encounter for therapeutic drug level monitoring: Secondary | ICD-10-CM | POA: Insufficient documentation

## 2016-04-08 DIAGNOSIS — Y9301 Activity, walking, marching and hiking: Secondary | ICD-10-CM | POA: Diagnosis not present

## 2016-04-08 DIAGNOSIS — Y999 Unspecified external cause status: Secondary | ICD-10-CM | POA: Insufficient documentation

## 2016-04-08 DIAGNOSIS — S51011A Laceration without foreign body of right elbow, initial encounter: Secondary | ICD-10-CM | POA: Diagnosis not present

## 2016-04-08 DIAGNOSIS — F17210 Nicotine dependence, cigarettes, uncomplicated: Secondary | ICD-10-CM | POA: Diagnosis not present

## 2016-04-08 DIAGNOSIS — J45909 Unspecified asthma, uncomplicated: Secondary | ICD-10-CM | POA: Insufficient documentation

## 2016-04-08 DIAGNOSIS — Z3A30 30 weeks gestation of pregnancy: Secondary | ICD-10-CM | POA: Insufficient documentation

## 2016-04-08 DIAGNOSIS — O9A213 Injury, poisoning and certain other consequences of external causes complicating pregnancy, third trimester: Secondary | ICD-10-CM | POA: Insufficient documentation

## 2016-04-08 DIAGNOSIS — O479 False labor, unspecified: Secondary | ICD-10-CM

## 2016-04-08 DIAGNOSIS — Z3482 Encounter for supervision of other normal pregnancy, second trimester: Secondary | ICD-10-CM

## 2016-04-08 DIAGNOSIS — S51019A Laceration without foreign body of unspecified elbow, initial encounter: Secondary | ICD-10-CM

## 2016-04-08 DIAGNOSIS — M545 Low back pain: Secondary | ICD-10-CM | POA: Insufficient documentation

## 2016-04-08 DIAGNOSIS — Y9289 Other specified places as the place of occurrence of the external cause: Secondary | ICD-10-CM | POA: Diagnosis not present

## 2016-04-08 DIAGNOSIS — W0110XA Fall on same level from slipping, tripping and stumbling with subsequent striking against unspecified object, initial encounter: Secondary | ICD-10-CM | POA: Diagnosis not present

## 2016-04-08 LAB — CBC WITH DIFFERENTIAL/PLATELET
Basophils Absolute: 0 10*3/uL (ref 0.0–0.1)
Basophils Relative: 0 %
Eosinophils Absolute: 0 10*3/uL (ref 0.0–0.7)
Eosinophils Relative: 0 %
HCT: 36.5 % (ref 36.0–46.0)
Hemoglobin: 12.2 g/dL (ref 12.0–15.0)
Lymphocytes Relative: 16 %
Lymphs Abs: 2 10*3/uL (ref 0.7–4.0)
MCH: 31.5 pg (ref 26.0–34.0)
MCHC: 33.4 g/dL (ref 30.0–36.0)
MCV: 94.3 fL (ref 78.0–100.0)
Monocytes Absolute: 0.6 10*3/uL (ref 0.1–1.0)
Monocytes Relative: 4 %
Neutro Abs: 10.1 10*3/uL — ABNORMAL HIGH (ref 1.7–7.7)
Neutrophils Relative %: 80 %
Platelets: 208 10*3/uL (ref 150–400)
RBC: 3.87 MIL/uL (ref 3.87–5.11)
RDW: 14.1 % (ref 11.5–15.5)
WBC: 12.7 10*3/uL — ABNORMAL HIGH (ref 4.0–10.5)

## 2016-04-08 LAB — BASIC METABOLIC PANEL
Anion gap: 9 (ref 5–15)
BUN: 6 mg/dL (ref 6–20)
CO2: 23 mmol/L (ref 22–32)
Calcium: 9.2 mg/dL (ref 8.9–10.3)
Chloride: 105 mmol/L (ref 101–111)
Creatinine, Ser: 0.51 mg/dL (ref 0.44–1.00)
GFR calc Af Amer: >60 mL/min (ref >60)
GFR calc non Af Amer: >60 mL/min (ref >60)
Glucose, Bld: 76 mg/dL (ref 65–99)
Potassium: 3.7 mmol/L (ref 3.5–5.1)
Sodium: 137 mmol/L (ref 135–145)

## 2016-04-08 LAB — PROTIME-INR
INR: 0.88
Prothrombin Time: 12 seconds (ref 11.4–15.2)

## 2016-04-08 MED ORDER — TERBUTALINE SULFATE 1 MG/ML IJ SOLN
0.2500 mg | Freq: Once | INTRAMUSCULAR | Status: AC
  Administered 2016-04-08: 0.25 mg via SUBCUTANEOUS
  Filled 2016-04-08: qty 1

## 2016-04-08 MED ORDER — LACTATED RINGERS IV SOLN: INTRAVENOUS | Status: DC

## 2016-04-08 MED ORDER — LACTATED RINGERS IV BOLUS (SEPSIS)
1000.0000 mL | Freq: Once | INTRAVENOUS | Status: AC
  Administered 2016-04-08: 1000 mL via INTRAVENOUS

## 2016-04-08 NOTE — ED Notes (Signed)
[facility], she requested that patient urinates to see if that changes anything with, pt states she already gave a sample and doesn't feel she can urinate again, pt refusing to try at this time, stated "only if it will help me go home quicker". Pt keeps saying she wants to leave, wants to go home etc. Patient has asked several times to go out and smoke.

## 2016-04-08 NOTE — ED Notes (Signed)
This note also relates to the following rows which could not be included: Pulse Rate - Cannot attach notes to unvalidated device data SpO2 - Cannot attach notes to unvalidated device data  RROB reviewed preterm labor precautions and fetal kick counts with pt over the phone; pt verbalized understanding; will also receive these in her d/c instructions

## 2016-04-08 NOTE — ED Notes (Signed)
Dr Loraine LericheMark spoke with RROB as well as Samantha,RN; plan of care is to discharge pt to home as long as pt does not have increased pain, vaginal bleeding or leaking of fluid; will continue to assess; CNM reviewed strip

## 2016-04-08 NOTE — ED Notes (Signed)
Samanth, RN@[facility]  notified [facility]OB of pt s/p fall at 1900. Pt fell in house, pt fell on her back. Pt reports pos fetal movement, no vaginal bleeding or leaking of fluid; reports contractions with pain in her abdomen and vagina, rating pain a 3. Will continue to assess

## 2016-04-08 NOTE — ED Provider Notes (Addendum)
[facility] Provider Note   CSN: [id number] Arrival date & time: 04/08/16  1844     History   Chief Complaint Chief Complaint  Patient presents with  . Fall    HPI Meagan [name] is a 25 y.o. female. She is G3P2 currently at 30 weeks. Followed by NP at [facility] [place].  She had a fall tonight. She was walking outside to get some food off the grill. There are 3 steps from the porch outside. It was raining. She slipped. She fell backwards with her back against the steps. She did not slide to the ground. Point to some back pain that is improving. She was Carrying a plate in her elbow was flexed. He landed on a glass bowl that was on the step. The glass shattered and she has 3 lacerations to her elbow. Has been having "Braxton Hicks" contractions for the last month. States she feels occasional "tightening" in her lower abdomen. She's been seen by her GYN for this. She feels no new pain or symptoms since her fall. She does not have leakage of fluid or blood. She is still feeling fetal movements.  HPI  Past Medical History:  Diagnosis Date  . Abdominal cramps 12/06/2015  . Anxiety   . Asthma    inhaler at bedside  . Daily headache   . Diarrhea 06/04/2013  . Hemiplegic migraine    blurred vision, nausea, dizziness, numbness  . Kidney stones   . Mental disorder    huffed inhalants "affected brain"  . Nausea 06/04/2013  . Nausea and vomiting during pregnancy 12/06/2015  . Pregnant 06/04/2013  . Pregnant 10/17/2015  . Pyelonephritis complicating pregnancy, antepartum, first trimester 11/05/2011  . Sore throat 06/04/2013  . Stomach ulcer     Patient Active Problem List   Diagnosis Date Noted  . Abdominal cramps 12/06/2015  . Diarrhea 12/06/2015  . Nausea and vomiting during pregnancy 12/06/2015  . Hx of preeclampsia, prior pregnancy, currently pregnant 11/16/2015  . Rh negative state in antepartum period 11/16/2015  . Supervision of normal pregnancy 11/15/2015   . 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 Smoker 12/16/2013  . Anxiety 06/17/2013    Past Surgical History:  Procedure Laterality Date  . no past surgery      OB History    Gravida Para Term Preterm AB Living   3 2 2  0 0 2   SAB TAB Ectopic Multiple Live Births   0 0 0 0 2       Home Medications    Prior to Admission medications   Medication Sig Start Date End Date Taking? Authorizing Provider  acetaminophen (TYLENOL) 500 MG tablet Take 1,000 mg by mouth every 6 (six) hours as needed for mild pain.    Yes Historical Provider, MD  albuterol (PROVENTIL HFA;VENTOLIN HFA) 108 (90 Base) MCG/ACT inhaler Inhale 1-2 puffs into the lungs every 6 (six) hours as needed for wheezing or shortness of breath.   Yes Historical Provider, MD  ALPRAZolam (XANAX) 0.25 MG tablet Take 1 tablet (0.25 mg total) by mouth 3 (three) times daily as needed for anxiety. Please use only three times daily when possible. 03/19/16  Yes Lazaro Arms, MD  nortriptyline (PAMELOR) 10 MG capsule Take 4 capsules (40 mg total) by mouth at bedtime. Patient taking differently: Take 20-40 mg by mouth at bedtime.  12/15/15  Yes Anson Fret, MD  prenatal vitamin w/FE, FA (PRENATAL 1 + 1) 27-1 MG TABS tablet Take 1 tablet by mouth  daily at 12 noon. 10/17/15  Yes Adline Potter, NP  escitalopram (LEXAPRO) 10 MG tablet Take 1 tablet (10 mg total) by mouth daily. Patient not taking: Reported on 03/21/2016 01/30/16 01/29/17  Tilda Burrow, MD  omeprazole (PRILOSEC) 20 MG capsule Take 1 capsule (20 mg total) by mouth daily. 1 tablet a day Patient not taking: Reported on 04/08/2016 12/15/15   Lazaro Arms, MD    Family History Family History  Problem Relation Age of Onset  . Cancer Maternal Grandmother     breast  . Cancer Paternal Grandfather     lung cancer  . Migraines Father   . Other Son     ventricular defect  . Hypertension Other     Social History Social History  Substance Use Topics  . Smoking status: Current  Every Day Smoker    Packs/day: 0.50    Years: 7.00    Types: Cigarettes  . Smokeless tobacco: Never Used  . Alcohol use No     Allergies   Codeine and Penicillins   Review of Systems Review of Systems  Constitutional: Negative for appetite change, chills, diaphoresis, fatigue and fever.  HENT: Negative for mouth sores, sore throat and trouble swallowing.   Eyes: Negative for visual disturbance.  Respiratory: Negative for cough, chest tightness, shortness of breath and wheezing.   Cardiovascular: Negative for chest pain.  Gastrointestinal: Negative for abdominal distention, abdominal pain, diarrhea, nausea and vomiting.  Endocrine: Negative for polydipsia, polyphagia and polyuria.  Genitourinary: Negative for dysuria, frequency, hematuria, vaginal bleeding, vaginal discharge and vaginal pain.  Musculoskeletal: Positive for back pain. Negative for gait problem.  Skin: Positive for wound. Negative for color change, pallor and rash.  Neurological: Negative for dizziness, syncope, light-headedness and headaches.  Hematological: Does not bruise/bleed easily.  Psychiatric/Behavioral: Negative for behavioral problems and confusion.     Physical Exam Updated Vital Signs BP 108/67 (BP Location: Left Arm)   Pulse 95   Temp 98.4 F (36.9 C) (Oral)   Resp 18   LMP 07/19/2015   SpO2 99%   Physical Exam  Constitutional: She is oriented to person, place, and time. She appears well-developed and well-nourished. No distress.  HENT:  Head: Normocephalic.  Eyes: Conjunctivae are normal. Pupils are equal, round, and reactive to light. No scleral icterus.  Neck: Normal range of motion. Neck supple. No thyromegaly present.  Cardiovascular: Normal rate and regular rhythm.  Exam reveals no gallop and no friction rub.   No murmur heard. Pulmonary/Chest: Effort normal and breath sounds normal. No respiratory distress. She has no wheezes. She has no rales.  Abdominal: Soft. Bowel sounds are  normal. She exhibits no distension. There is no tenderness. There is no rebound.  Gravid abdomen. Nontender to examine.  Musculoskeletal: Normal range of motion.  No midline spinal tenderness. No direct spinal or pelvic tenderness.   Midline muscular tenderness of the lumbar spine.  Neurological: She is alert and oriented to person, place, and time.  Skin: Skin is warm and dry. No rash noted.  Range of motion of the right elbow. 3 small parallel lacerations to right elbow. 1 cm, 1 cm, 37 m. Normal range of motion. Normal neurovascular exam distally.  Psychiatric: She has a normal mood and affect. Her behavior is normal.     ED Treatments / Results  Labs (all labs ordered are listed, but only abnormal results are displayed) Labs Reviewed  CBC WITH DIFFERENTIAL/PLATELET  BASIC METABOLIC PANEL  PROTIME-INR    EKG  EKG Interpretation None       Radiology Dg Elbow 2 Views Right  Result Date: 04/08/2016 CLINICAL DATA:  Initial evaluation for acute trauma, laceration at elbow. EXAM: RIGHT ELBOW - 2 VIEW COMPARISON:  None. FINDINGS: No acute fracture or dislocation. No joint effusion. Soft tissue irregularity at the posterior aspect of the elbow likely related to soft tissue injury/ laceration. No radiopaque foreign body. IMPRESSION: 1. Soft tissue injury/laceration at the posterior aspect of the elbow. No radiopaque foreign body. 2. No acute fracture or dislocation. Electronically Signed   By: Rise MuBenjamin  McClintock M.D.   On: 04/08/2016 21:05    Procedures Procedures (including critical care time)  Medications Ordered in ED Medications  lactated ringers bolus 1,000 mL (1,000 mLs Intravenous New Bag/Given 04/08/16 2049)  lactated ringers infusion (not administered)  terbutaline (BRETHINE) injection 0.25 mg (not administered)     Initial Impression / Assessment and Plan / ED Course  I have reviewed the triage vital signs and the nursing notes.  Pertinent labs & imaging results  that were available during my care of the patient were reviewed by me and considered in my medical decision making (see chart for details).  Clinical Course   LACERATION REPAIR Performed by: Claudean KindsJAMES, Courtany Mcmurphy JOSEPH Authorized by: Claudean KindsJAMES, Darina Hartwell JOSEPH Consent: Verbal consent obtained. Risks and benefits: risks, benefits and alternatives were discussed Consent given by: patient Patient identity confirmed: provided demographic data Prepped and Draped in normal sterile fashion Wound explored  Laceration Location: right elbow powteriorly  Laceration Length: 1cm, 1cm, 3cm  No Foreign Bodies seen or palpated  Anesthesia: local infiltration  Local anesthetic: lidocaine 1% c epinephrine  Anesthetic total: 4 ml  Irrigation method: syringe Amount of cleaning: standard  Skin closure: 4-0 nylon  Number of sutures: 8 total  Technique: simple, and running locking  Patient tolerance: Patient tolerated the procedure well with no immediate complications.   Final Clinical Impressions(s) / ED Diagnoses   Final diagnoses:  Elbow laceration  Irregular uterine contractions    Ace and placed on OB monitoring. Apparently is having some irregular contractions. [facility]   22:05:  She has been given terbutaline. Contractions essentially markedly improved if not resolved. They recommended additional hour monitoring to complete 4 hours. Consider low risk as patient did not fall on abdomen. I discussed the case with rapid response nurse who discussed the case with OB/GYN attending [facility] patient's monitor. Patient appears in no distress. There recommended additional hour monitoring to complete 4 hours monitoring and if stable, minimally contracting, they will recommend discharge. It is currently 10:06 PM. One additional hour monitoring  New Prescriptions New Prescriptions   No medications on file     Rolland PorterMark Zanaya Baize, MD 04/08/16 2129      Rolland PorterMark Idella Lamontagne, MD 04/08/16 2206

## 2016-04-08 NOTE — ED Triage Notes (Signed)
Patient brought in via EMS from home for fall. Patient slipped on wet stairs and hit back. Unsure of hitting head. Denies any LOC, dizziness, or headache. Patient was carrying glass plate which broke cutting arm. Patient has 3 lacerations to elbow. Elbow wrapped with gauze dressing by EMS. Clean, dry, and intact. Patient unsure of last tetanus vaccination. Patient [redacted] weeks pregnant. Per patient pressure with braxton hicks x3 weeks. Patient reports tightening in abd since fall. Patient reports fetal movement. Per patient 3 child. Denies any complications with this or previous pregnancies. Per patient [facility].

## 2016-04-08 NOTE — ED Notes (Signed)
Pt ambulated to restroom with difficulty, hooked back up to TOCO and FHR, both picking up okay per Florentina AddisonKatie, Pt states she feels like had "contractions" has stopped since administration of Terbutaline

## 2016-04-08 NOTE — ED Notes (Signed)
RROB spoke with Rutherford Guysarlene Lawson,CNM; she reviewed strip and gave orders for 1000ml fluid bolus;will continue to assess.

## 2016-04-08 NOTE — ED Notes (Signed)
Rapid Respond OB called at 2015, per Katie patient is having contractions she will cal us back after OB doc reviews.

## 2016-04-08 NOTE — ED Notes (Signed)
This note also relates to the following rows which could not be included: Pulse Rate - Cannot attach notes to unvalidated device data SpO2 - Cannot attach notes to unvalidated device data  [facility] RN called [facility] to touch base on strip; told that fhr tracing is reactive/reassuring, pt is contracting every 2-363min; asked RN to get pt up to bathroom to empty her bladder to see if that will make a difference in her contractions.

## 2016-04-08 NOTE — ED Notes (Signed)
This note also relates to the following rows which could not be included: Pulse Rate - Cannot attach notes to unvalidated device data SpO2 - Cannot attach notes to unvalidated device data  CNM reviewed strip; gave orders for terbutaline 0.25 sq; RROB called the ED to notify RN of new order

## 2016-04-09 ENCOUNTER — Telehealth: Payer: Self-pay | Admitting: *Deleted

## 2016-04-09 NOTE — ED Provider Notes (Signed)
Fetal heart tracing reassuring.  Per Dr. Despina HiddenEure and Dickey GaveNM Lawson, patient stable for discharge at 11 pm.  BP 110/57   Pulse 100   Temp 98.4 F (36.9 C) (Oral)   Resp 18   LMP 07/19/2015   SpO2 97%     Glynn OctaveStephen Robecca Fulgham, MD 04/09/16 0028

## 2016-04-09 NOTE — Telephone Encounter (Signed)
[facility], was also seen at [facility] for laceration in arm and was treated and discharged home. Pt states she would like to get pain medication for her arm injury. [facility]. Pt advised if she was not having in new symptoms in regards to her pregnancy she could keep her appt for Wednesday. [facility] reassuring. Pt states she is not having any new problems since [facility] and verbalized understanding.

## 2016-04-09 NOTE — ED Notes (Signed)
Pt states she is leaving at this time since the doctor is not going to give her pain medication here or a prescription for pain medication. States she did not have time to wait for paperwork. Delay was explained to patient

## 2016-04-09 NOTE — Discharge Instructions (Signed)
Follow up with your OB doctor. Return to the ED if you develop new or worsening symptoms. °

## 2016-04-10 ENCOUNTER — Telehealth: Payer: Self-pay | Admitting: Advanced Practice Midwife

## 2016-04-10 ENCOUNTER — Inpatient Hospital Stay (HOSPITAL_COMMUNITY)
Admission: AD | Admit: 2016-04-10 | Discharge: 2016-04-10 | Disposition: A | Discharge status: Home / Self Care | Payer: Medicaid Other | Source: Ambulatory Visit | Attending: Obstetrics and Gynecology | Admitting: Obstetrics and Gynecology

## 2016-04-10 ENCOUNTER — Encounter (HOSPITAL_COMMUNITY): Payer: Self-pay

## 2016-04-10 DIAGNOSIS — F17210 Nicotine dependence, cigarettes, uncomplicated: Secondary | ICD-10-CM | POA: Insufficient documentation

## 2016-04-10 DIAGNOSIS — T814XXA Infection following a procedure, initial encounter: Secondary | ICD-10-CM | POA: Insufficient documentation

## 2016-04-10 DIAGNOSIS — O99513 Diseases of the respiratory system complicating pregnancy, third trimester: Secondary | ICD-10-CM | POA: Insufficient documentation

## 2016-04-10 DIAGNOSIS — T148XXA Other injury of unspecified body region, initial encounter: Secondary | ICD-10-CM

## 2016-04-10 DIAGNOSIS — O133 Gestational [pregnancy-induced] hypertension without significant proteinuria, third trimester: Secondary | ICD-10-CM | POA: Diagnosis not present

## 2016-04-10 DIAGNOSIS — L089 Local infection of the skin and subcutaneous tissue, unspecified: Secondary | ICD-10-CM | POA: Diagnosis not present

## 2016-04-10 DIAGNOSIS — J45909 Unspecified asthma, uncomplicated: Secondary | ICD-10-CM | POA: Diagnosis not present

## 2016-04-10 DIAGNOSIS — O4703 False labor before 37 completed weeks of gestation, third trimester: Secondary | ICD-10-CM | POA: Diagnosis not present

## 2016-04-10 DIAGNOSIS — Z880 Allergy status to penicillin: Secondary | ICD-10-CM | POA: Insufficient documentation

## 2016-04-10 DIAGNOSIS — O99333 Smoking (tobacco) complicating pregnancy, third trimester: Secondary | ICD-10-CM | POA: Diagnosis not present

## 2016-04-10 DIAGNOSIS — Z3A29 29 weeks gestation of pregnancy: Secondary | ICD-10-CM | POA: Diagnosis not present

## 2016-04-10 LAB — WET PREP, GENITAL
Clue Cells Wet Prep HPF POC: NONE SEEN
Sperm: NONE SEEN
Trich, Wet Prep: NONE SEEN
Yeast Wet Prep HPF POC: NONE SEEN

## 2016-04-10 LAB — COMPREHENSIVE METABOLIC PANEL
ALT: 11 U/L — ABNORMAL LOW (ref 14–54)
AST: 11 U/L — ABNORMAL LOW (ref 15–41)
Albumin: 2.7 g/dL — ABNORMAL LOW (ref 3.5–5.0)
Alkaline Phosphatase: 65 U/L (ref 38–126)
Anion gap: 8 (ref 5–15)
BUN: 6 mg/dL (ref 6–20)
CO2: 22 mmol/L (ref 22–32)
Calcium: 8.7 mg/dL — ABNORMAL LOW (ref 8.9–10.3)
Chloride: 105 mmol/L (ref 101–111)
Creatinine, Ser: 0.6 mg/dL (ref 0.44–1.00)
GFR calc Af Amer: >60 mL/min (ref >60)
GFR calc non Af Amer: >60 mL/min (ref >60)
Glucose, Bld: 114 mg/dL — ABNORMAL HIGH (ref 65–99)
Potassium: 3.5 mmol/L (ref 3.5–5.1)
Sodium: 135 mmol/L (ref 135–145)
Total Bilirubin: 0.4 mg/dL (ref 0.3–1.2)
Total Protein: 5.9 g/dL — ABNORMAL LOW (ref 6.5–8.1)

## 2016-04-10 LAB — URINALYSIS, ROUTINE W REFLEX MICROSCOPIC
Bilirubin Urine: NEGATIVE
Glucose, UA: NEGATIVE mg/dL
Hgb urine dipstick: NEGATIVE
Ketones, ur: NEGATIVE mg/dL
Nitrite: NEGATIVE
Protein, ur: NEGATIVE mg/dL
Specific Gravity, Urine: 1.025 (ref 1.005–1.030)
pH: 6 (ref 5.0–8.0)

## 2016-04-10 LAB — CBC
HCT: 32.2 % — ABNORMAL LOW (ref 36.0–46.0)
Hemoglobin: 11 g/dL — ABNORMAL LOW (ref 12.0–15.0)
MCH: 31.7 pg (ref 26.0–34.0)
MCHC: 34.2 g/dL (ref 30.0–36.0)
MCV: 92.8 fL (ref 78.0–100.0)
Platelets: 188 10*3/uL (ref 150–400)
RBC: 3.47 MIL/uL — ABNORMAL LOW (ref 3.87–5.11)
RDW: 14.2 % (ref 11.5–15.5)
WBC: 11.4 10*3/uL — ABNORMAL HIGH (ref 4.0–10.5)

## 2016-04-10 LAB — URINE MICROSCOPIC-ADD ON

## 2016-04-10 LAB — PROTEIN / CREATININE RATIO, URINE
Creatinine, Urine: 153 mg/dL
Protein Creatinine Ratio: 0.08 mg/mg{Cre} (ref 0.00–0.15)
Total Protein, Urine: 12 mg/dL

## 2016-04-10 LAB — POCT FERN TEST: POCT Fern Test: NEGATIVE

## 2016-04-10 MED ORDER — TRAMADOL HCL 50 MG PO TABS
50.0000 mg | ORAL_TABLET | Freq: Once | ORAL | Status: AC
  Administered 2016-04-10: 50 mg via ORAL
  Filled 2016-04-10: qty 1

## 2016-04-10 MED ORDER — SULFAMETHOXAZOLE-TRIMETHOPRIM 800-160 MG PO TABS
1.0000 | ORAL_TABLET | Freq: Once | ORAL | Status: AC
  Administered 2016-04-10: 1 via ORAL
  Filled 2016-04-10: qty 1

## 2016-04-10 MED ORDER — SULFAMETHOXAZOLE-TRIMETHOPRIM 800-160 MG PO TABS: 1.0000 | ORAL_TABLET | Freq: Two times a day (BID) | ORAL | 0 refills | Status: AC

## 2016-04-10 MED ORDER — TRAMADOL HCL 50 MG PO TABS: 100.0000 mg | ORAL_TABLET | Freq: Once | ORAL | Status: DC

## 2016-04-10 MED ORDER — TRAMADOL HCL 50 MG PO TABS: 50.0000 mg | ORAL_TABLET | Freq: Four times a day (QID) | ORAL | 0 refills | Status: DC | PRN

## 2016-04-10 NOTE — MAU Provider Note (Signed)
Chief Complaint:  No chief complaint on file.   First Provider Initiated Contact with Patient 04/10/16 2029     HPI: Meagan [name] is a 25 y.o. G3P2002 at [redacted]w[redacted]d who presents to maternity admissions reporting: 1 ~15 contractions per day.  2. Blood pressure 134/110 and 142/102 on her home blood pressure cuff. Has a history of preeclampsia in previous pregnancy, but no elevated blood pressures this pregnancy on review of her prenatal records. 3. Fever of 101.7 at home today. Has not had any antipyretics in the past 24 hours. 4. Elevated pulse at home today when checking blood pressure 5. Leaking small amount of clear fluid upon arrival to maternity admissions today.  Was seen at [facility] emergency room Sunday, 04/08/2016 for suturing of a laceration and preterm contractions. States she was told she was 2 cm and 50% effaced. Was given medication (terbutaline) which made contractions going temporarily, but they have returned. States she did not get any discharge instructions regarding her laceration and was never told where and when to have the sutures removed. Denies any significant pain at the site of the lacerations except when she is sleeping. The pain wakes her up. No improvement with Tylenol.  Denies vaginal bleeding. Good fetal movement.   Past Medical History: Past Medical History:  Diagnosis Date  . Abdominal cramps 12/06/2015  . Anxiety   . Asthma    inhaler at bedside  . Daily headache   . Diarrhea 06/04/2013  . Hemiplegic migraine    blurred vision, nausea, dizziness, numbness  . Kidney stones   . Mental disorder    huffed inhalants "affected brain"  . Nausea 06/04/2013  . Nausea and vomiting during pregnancy 12/06/2015  . Pregnant 06/04/2013  . Pregnant 10/17/2015  . Pyelonephritis complicating pregnancy, antepartum, first trimester 11/05/2011  . Sore throat 06/04/2013  . Stomach ulcer     Past obstetric history: OB History  Gravida Para Term Preterm AB Living  3 2 2   0 0 2  SAB TAB Ectopic Multiple Live Births  0 0 0 0 2    # Outcome Date GA Lbr Len/2nd Weight Sex Delivery Anes PTL Lv  3 Current           2 Term 02/13/14 [redacted]w[redacted]d 03:14 / 00:05 6 lb 12.1 oz (3.065 kg) F Vag-Spont EPI N LIV  1 Term 05/30/12 [redacted]w[redacted]d 15:31 / 04:14 7 lb 14 oz (3.572 kg) M Vag-Spont EPI N LIV      Past Surgical History: Past Surgical History:  Procedure Laterality Date  . no past surgery       Family History: Family History  Problem Relation Age of Onset  . Cancer Maternal Grandmother     breast  . Cancer Paternal Grandfather     lung cancer  . Migraines Father   . Other Son     ventricular defect  . Hypertension Other     Social History: Social History  Substance Use Topics  . Smoking status: Current Every Day Smoker    Packs/day: 0.50    Years: 7.00    Types: Cigarettes  . Smokeless tobacco: Never Used  . Alcohol use No    Allergies:  Allergies  Allergen Reactions  . Codeine Nausea And Vomiting and Other (See Comments)    Vertigo  . Penicillins Rash and Other (See Comments)    Unknown:  Childhood reaction.  Has patient had a PCN reaction causing immediate rash, facial/tongue/throat swelling, SOB or lightheadedness with hypotension: Unknown Has patient  had a PCN reaction causing severe rash involving mucus membranes or skin necrosis: Unknown Has patient had a PCN reaction that required hospitalization No Has patient had a PCN reaction occurring within the last 10 years: No If all of the above answers are "NO", then may pro    Meds:  No prescriptions prior to admission.    I have reviewed patient's Past Medical Hx, Surgical Hx, Family Hx, Social Hx, medications and allergies.   ROS:  Review of Systems  Constitutional: Positive for fever. Negative for appetite change, chills and fatigue.  HENT: Negative for congestion and sore throat.   Eyes: Negative for visual disturbance.  Respiratory: Negative for cough.   Gastrointestinal: Negative for  abdominal pain, constipation, diarrhea, nausea and vomiting.  Genitourinary: Positive for vaginal discharge. Negative for dysuria, flank pain, hematuria and vaginal bleeding.  Musculoskeletal: Negative for back pain.  Skin: Positive for wound.  Neurological: Negative for headaches.  Psychiatric/Behavioral: The patient is nervous/anxious.     Physical Exam   Patient Vitals for the past 24 hrs:  BP Temp Temp src Pulse Resp SpO2 Height Weight  04/10/16 2316 123/72 - - 102 16 - - -  04/10/16 2217 - 98.2 F (36.8 C) Oral - - - - -  04/10/16 2149 112/58 - - 105 - - - -  04/10/16 2134 108/62 - - 100 - - - -  04/10/16 2127 - - - 102 - - - -  04/10/16 2122 - - - 108 - - - -  04/10/16 2119 107/69 - - 108 - - - -  04/10/16 2103 112/63 - - 109 - - - -  04/10/16 2049 (!) 108/54 - - 113 - - - -  04/10/16 2047 112/63 - - 113 - - - -  04/10/16 1942 117/62 99 F (37.2 C) Oral (!) 126 16 98 % 5\' 2"  (1.575 m) 178 lb (80.7 kg)   Constitutional: Well-developed, well-nourished female in no acute distress. Very anxious. Cardiovascular: Tachycardic initially, but improved with rest and PO fluids. Respiratory: normal effort GI: Abd soft, non-tender, gravid appropriate for gestational age.  MS: Right forearm and elbow--3 sutured, well approximated lacerations, one of which is directly over the elbow. Multiple other tiny abrasions, one of which is oozing a scant amount of pus and is surrounded by mild erythema. No purulent drainage, bleeding or erythema around sutured lacerations. Minimal tenderness. Normal range of motion of elbow. Neurologic: Alert and oriented x 4.  GU:  Pelvic: NEFG, small amount of mucoid discharge, negative pooling with Valsalva, no blood, cervix clean. No CMT  Dilation: Closed Effacement (%): Thick Exam by:: Dorathy KinsmanVirginia Jaila Schellhorn CNM  FHT:  Baseline 145 , moderate variability, accelerations present, no decelerations Contractions: UI   Labs: Results for orders placed or performed during  the hospital encounter of 04/10/16 (from the past 24 hour(s))  Urinalysis, Routine w reflex microscopic (not at [facility])     Status: Abnormal   Collection Time: 04/10/16  7:31 PM  Result Value Ref Range   Color, Urine YELLOW YELLOW   APPearance CLEAR CLEAR   Specific Gravity, Urine 1.025 1.005 - 1.030   pH 6.0 5.0 - 8.0   Glucose, UA NEGATIVE NEGATIVE mg/dL   Hgb urine dipstick NEGATIVE NEGATIVE   Bilirubin Urine NEGATIVE NEGATIVE   Ketones, ur NEGATIVE NEGATIVE mg/dL   Protein, ur NEGATIVE NEGATIVE mg/dL   Nitrite NEGATIVE NEGATIVE   Leukocytes, UA SMALL (A) NEGATIVE  Urine microscopic-add on     Status: Abnormal  Collection Time: 04/10/16  7:31 PM  Result Value Ref Range   Squamous Epithelial / LPF 6-30 (A) NONE SEEN   WBC, UA 6-30 0 - 5 WBC/hpf   RBC / HPF 0-5 0 - 5 RBC/hpf   Bacteria, UA FEW (A) NONE SEEN  Protein / creatinine ratio, urine     Status: None   Collection Time: 04/10/16  7:31 PM  Result Value Ref Range   Creatinine, Urine 153.00 mg/dL   Total Protein, Urine 12 mg/dL   Protein Creatinine Ratio 0.08 0.00 - 0.15 mg/mg[Cre]  POCT fern test     Status: None   Collection Time: 04/10/16  8:46 PM  Result Value Ref Range   POCT Fern Test Negative = intact amniotic membranes   Wet prep, genital     Status: Abnormal   Collection Time: 04/10/16  8:48 PM  Result Value Ref Range   Yeast Wet Prep HPF POC NONE SEEN NONE SEEN   Trich, Wet Prep NONE SEEN NONE SEEN   Clue Cells Wet Prep HPF POC NONE SEEN NONE SEEN   WBC, Wet Prep HPF POC MODERATE (A) NONE SEEN   Sperm NONE SEEN   CBC     Status: Abnormal   Collection Time: 04/10/16  8:52 PM  Result Value Ref Range   WBC 11.4 (H) 4.0 - 10.5 K/uL   RBC 3.47 (L) 3.87 - 5.11 MIL/uL   Hemoglobin 11.0 (L) 12.0 - 15.0 g/dL   HCT 16.1 (L) 09.6 - 04.5 %   MCV 92.8 78.0 - 100.0 fL   MCH 31.7 26.0 - 34.0 pg   MCHC 34.2 30.0 - 36.0 g/dL   RDW 40.9 81.1 - 91.4 %   Platelets 188 150 - 400 K/uL  Comprehensive metabolic panel      Status: Abnormal   Collection Time: 04/10/16  8:52 PM  Result Value Ref Range   Sodium 135 135 - 145 mmol/L   Potassium 3.5 3.5 - 5.1 mmol/L   Chloride 105 101 - 111 mmol/L   CO2 22 22 - 32 mmol/L   Glucose, Bld 114 (H) 65 - 99 mg/dL   BUN 6 6 - 20 mg/dL   Creatinine, Ser 7.82 0.44 - 1.00 mg/dL   Calcium 8.7 (L) 8.9 - 10.3 mg/dL   Total Protein 5.9 (L) 6.5 - 8.1 g/dL   Albumin 2.7 (L) 3.5 - 5.0 g/dL   AST 11 (L) 15 - 41 U/L   ALT 11 (L) 14 - 54 U/L   Alkaline Phosphatase 65 38 - 126 U/L   Total Bilirubin 0.4 0.3 - 1.2 mg/dL   GFR calc non Af Amer >60 >60 mL/min   GFR calc Af Amer >60 >60 mL/min   Anion gap 8 5 - 15    Imaging:  NA  MAU Course: Orders Placed This Encounter  Procedures  . Wet prep, genital  . Urinalysis, Routine w reflex microscopic (not at [facility])  . Urine microscopic-add on  . CBC  . Comprehensive metabolic panel  . Protein / creatinine ratio, urine  . POCT fern test  . Discharge patient  Bactrim, Ultram given.  Consulted with Dr. Adela Lank at [facility] ED regarding appearance of laceration and reported fever. Recommends Bactrim DS and removal of suture in 2 weeks since they are over a joint. Can be done at PCP, OB or ED.  MDM: - Preterm contractions without evidence of active preterm labor -  Mild wound infection. Uncertain if patient had a fever from this.  None in MAU. No masking of fever with antipyretics. We will treat with Bactrim to cover skin flora including MRSA. Wound infection precautions reviewed. Patient instructed to have sutures removed 2 weeks after they were placed at either primary care provider or [name] if they're comfortable doing it. No evidence of significant infection at this time.  - Transient hypertension at home. Excellent blood pressures and normal preeclampsia labs in MAU.  - Mild tachycardia possibly related to combination of borderline dehydration and severe anxiety.   Assessment: 1. Post-traumatic wound infection    2. Preterm uterine contractions, antepartum, third trimester   3. Transient hypertension of pregnancy in third trimester     Plan: Discharge home in stable condition.  Preterm Labor precautions and fetal kick counts. Laceration care and precautions reviewed. Rx Bactrim and Ultram.  Follow-up Information    [facility] .   Why:  as scheduled for prenatal appointment Contact information: [address] [place] [address] [phone]       [facility] .   Specialty:  Family Medicine Why:  to remove stitches two weeks after injury or sooner as needed for complications       [facility] EMERGENCY DEPARTMENT .   Specialty:  Emergency Medicine Why:  as needed for non-pregnancy emergencies  Contact information: [address] [id number] [place] [address] [phone]            Medication List    STOP taking these medications   escitalopram 10 MG tablet Commonly known as:  LEXAPRO   omeprazole 20 MG capsule Commonly known as:  PRILOSEC     TAKE these medications   acetaminophen 500 MG tablet Commonly known as:  TYLENOL Take 1,000 mg by mouth every 6 (six) hours as needed for mild pain.   ALPRAZolam 0.25 MG tablet Commonly known as:  XANAX Take 1 tablet (0.25 mg total) by mouth 3 (three) times daily as needed for anxiety. Please use only three times daily when possible.   nortriptyline 10 MG capsule Commonly known as:  PAMELOR Take 4 capsules (40 mg total) by mouth at bedtime. What changed:  how much to take   prenatal vitamin w/FE, FA 27-1 MG Tabs tablet Take 1 tablet by mouth daily at 12 noon.   sulfamethoxazole-trimethoprim 800-160 MG tablet Commonly known as:  BACTRIM DS,SEPTRA DS Take 1 tablet by mouth 2 (two) times daily.   traMADol 50 MG tablet Commonly known as:  ULTRAM Take 1-2 tablets (50-100 mg total) by mouth every 6 (six) hours as needed for  severe pain.       Carmine, CNM 04/10/2016 11:53 PM

## 2016-04-10 NOTE — Telephone Encounter (Signed)
To MAU--too many conmplaints for a work in [employer], full schedule

## 2016-04-10 NOTE — Discharge Instructions (Signed)
Braxton Hicks Contractions °Contractions of the uterus can occur throughout pregnancy. Contractions are not always a sign that you are in labor.  °WHAT ARE BRAXTON HICKS CONTRACTIONS?  °Contractions that occur before labor are called Braxton Hicks contractions, or false labor. Toward the end of pregnancy (32-34 weeks), these contractions can develop more often and may become more forceful. This is not true labor because these contractions do not result in opening (dilatation) and thinning of the cervix. They are sometimes difficult to tell apart from true labor because these contractions can be forceful and people have different pain tolerances. You should not feel embarrassed if you go to the hospital with false labor. Sometimes, the only way to tell if you are in true labor is for your health care provider to look for changes in the cervix. °If there are no prenatal problems or other health problems associated with the pregnancy, it is completely safe to be sent home with false labor and await the onset of true labor. °HOW CAN YOU TELL THE DIFFERENCE BETWEEN TRUE AND FALSE LABOR? °False Labor °· The contractions of false labor are usually shorter and not as hard as those of true labor.   °· The contractions are usually irregular.   °· The contractions are often felt in the front of the lower abdomen and in the groin.   °· The contractions may go away when you walk around or change positions while lying down.   °· The contractions get weaker and are shorter lasting as time goes on.   °· The contractions do not usually become progressively stronger, regular, and closer together as with true labor.   °True Labor °· Contractions in true labor last 30-70 seconds, become very regular, usually become more intense, and increase in frequency.   °· The contractions do not go away with walking.   °· The discomfort is usually felt in the top of the uterus and spreads to the lower abdomen and low back.   °· True labor can be  determined by your health care provider with an exam. This will show that the cervix is dilating and getting thinner.   °WHAT TO REMEMBER °· Keep up with your usual exercises and follow other instructions given by your health care provider.   °· Take medicines as directed by your health care provider.   °· Keep your regular prenatal appointments.   °· Eat and drink lightly if you think you are going into labor.   °· If Braxton Hicks contractions are making you uncomfortable:   °¨ Change your position from lying down or resting to walking, or from walking to resting.   °¨ Sit and rest in a tub of warm water.   °¨ Drink 2-3 glasses of water. Dehydration may cause these contractions.   °¨ Do slow and deep breathing several times an hour.   °WHEN SHOULD I SEEK IMMEDIATE MEDICAL CARE? °Seek immediate medical care if: °· Your contractions become stronger, more regular, and closer together.   °· You have fluid leaking or gushing from your vagina.   °· You have a fever.   °· You pass blood-tinged mucus.   °· You have vaginal bleeding.   °· You have continuous abdominal pain.   °· You have low back pain that you never had before.   °· You feel your baby's head pushing down and causing pelvic pressure.   °· Your baby is not moving as much as it used to.   °  °This information is not intended to replace advice given to you by your health care provider. Make sure you discuss any questions you have with your health care   provider.   Document Released: 06/18/2005 Document Revised: 06/23/2013 Document Reviewed: 03/30/2013 Elsevier Interactive Patient Education 2016 Elsevier Inc.   Laceration Care, Adult  Have the stitches removed by your primary care provider or Ob/Gyn (if he or she is comfortable) 2 weeks after your injury A laceration is a cut that goes through all of the layers of the skin and into the tissue that is right under the skin. Some lacerations heal on their own. Others need to be closed with stitches  (sutures), staples, skin adhesive strips, or skin glue. Proper laceration care minimizes the risk of infection and helps the laceration to heal better. HOW TO CARE FOR YOUR LACERATION If sutures or staples were used:  Keep the wound clean and dry.  If you were given a bandage (dressing), you should change it at least one time per day or as told by your health care provider. You should also change it if it becomes wet or dirty.  Keep the wound completely dry for the first 24 hours or as told by your health care provider. After that time, you may shower or bathe. However, make sure that the wound is not soaked in water until after the sutures or staples have been removed.  Clean the wound one time each day or as told by your health care provider:  Wash the wound with soap and water.  Rinse the wound with water to remove all soap.  Pat the wound dry with a clean towel. Do not rub the wound.  After cleaning the wound, apply a thin layer of antibiotic ointmentas told by your health care provider. This will help to prevent infection and keep the dressing from sticking to the wound.  Have the sutures or staples removed as told by your health care provider. If skin adhesive strips were used:  Keep the wound clean and dry.  If you were given a bandage (dressing), you should change it at least one time per day or as told by your health care provider. You should also change it if it becomes dirty or wet.  Do not get the skin adhesive strips wet. You may shower or bathe, but be careful to keep the wound dry.  If the wound gets wet, pat it dry with a clean towel. Do not rub the wound.  Skin adhesive strips fall off on their own. You may trim the strips as the wound heals. Do not remove skin adhesive strips that are still stuck to the wound. They will fall off in time. If skin glue was used:  Try to keep the wound dry, but you may briefly wet it in the shower or bath. Do not soak the wound in  water, such as by swimming.  After you have showered or bathed, gently pat the wound dry with a clean towel. Do not rub the wound.  Do not do any activities that will make you sweat heavily until the skin glue has fallen off on its own.  Do not apply liquid, cream, or ointment medicine to the wound while the skin glue is in place. Using those may loosen the film before the wound has healed.  If you were given a bandage (dressing), you should change it at least one time per day or as told by your health care provider. You should also change it if it becomes dirty or wet.  If a dressing is placed over the wound, be careful not to apply tape directly over the skin glue. Doing  that may cause the glue to be pulled off before the wound has healed.  Do not pick at the glue. The skin glue usually remains in place for 5-10 days, then it falls off of the skin. General Instructions  Take over-the-counter and prescription medicines only as told by your health care provider.  If you were prescribed an antibiotic medicine or ointment, take or apply it as told by your doctor. Do not stop using it even if your condition improves.  To help prevent scarring, make sure to cover your wound with sunscreen whenever you are outside after stitches are removed, after adhesive strips are removed, or when glue remains in place and the wound is healed. Make sure to wear a sunscreen of at least 30 SPF.  Do not scratch or pick at the wound.  Keep all follow-up visits as told by your health care provider. This is important.  Check your wound every day for signs of infection. Watch for:  Redness, swelling, or pain.  Fluid, blood, or pus.  Raise (elevate) the injured area above the level of your heart while you are sitting or lying down, if possible. SEEK MEDICAL CARE IF:  You received a tetanus shot and you have swelling, severe pain, redness, or bleeding at the injection site.  You have a fever.  A wound that  was closed breaks open.  You notice a bad smell coming from your wound or your dressing.  You notice something coming out of the wound, such as wood or glass.  Your pain is not controlled with medicine.  You have increased redness, swelling, or pain at the site of your wound.  You have fluid, blood, or pus coming from your wound.  You notice a change in the color of your skin near your wound.  You need to change the dressing frequently due to fluid, blood, or pus draining from the wound.  You develop a new rash.  You develop numbness around the wound. SEEK IMMEDIATE MEDICAL CARE IF:  You develop severe swelling around the wound.  Your pain suddenly increases and is severe.  You develop painful lumps near the wound or on skin that is anywhere on your body.  You have a red streak going away from your wound.  The wound is on your hand or foot and you cannot properly move a finger or toe.  The wound is on your hand or foot and you notice that your fingers or toes look pale or bluish.   This information is not intended to replace advice given to you by your health care provider. Make sure you discuss any questions you have with your health care provider.   Document Released: 06/18/2005 Document Revised: 11/02/2014 Document Reviewed: 06/14/2014 Elsevier Interactive Patient Education [employer]

## 2016-04-10 NOTE — Telephone Encounter (Signed)
Patient called stating her blood pressure was 145/110, P 130. Has recent ER visit due to fall in which she needed stiches on her arm. States she has had a "low grade fever" 101.2 at the highest. Encouraged patient to rest for 10 minutes and to recheck blood pressure and temp and to call back with reading.   Patient returned call stating blood pressure was now 134/102, pulse 127, Temp 97.0. She was seeing spots last night and today along with floaters. She does currently have a headache but has not taken any tylenol today but did last night but with no relief. Meagan [name], CNM made aware of readings. Advised patient to [facility] pressures and lab work given blood pressure readings and recent temps. Patient stated she would go once husband arrived home from work.

## 2016-04-10 NOTE — MAU Note (Signed)
Pt states she was seen in the ER on Sunday after a fall and was contracting at that time and they gave her some meds but she has continued to contractions. Denies bleeding, but states when she went to the restroom here she noticed some clear fluid in her underwear. States she has been running a fever of 101.7 at home and she checked her b/p today and it was 134/110 and 142/102 and her pulse has been elevated.

## 2016-04-11 ENCOUNTER — Encounter: Payer: Medicaid Other | Admitting: Advanced Practice Midwife

## 2016-04-11 ENCOUNTER — Ambulatory Visit (INDEPENDENT_AMBULATORY_CARE_PROVIDER_SITE_OTHER): Payer: Medicaid Other | Admitting: Advanced Practice Midwife

## 2016-04-11 VITALS — BP 124/70 | HR 112 | Wt 180.0 lb

## 2016-04-11 DIAGNOSIS — O99343 Other mental disorders complicating pregnancy, third trimester: Secondary | ICD-10-CM

## 2016-04-11 DIAGNOSIS — Z3492 Encounter for supervision of normal pregnancy, unspecified, second trimester: Secondary | ICD-10-CM

## 2016-04-11 DIAGNOSIS — Z6791 Unspecified blood type, Rh negative: Secondary | ICD-10-CM

## 2016-04-11 DIAGNOSIS — Z1389 Encounter for screening for other disorder: Secondary | ICD-10-CM | POA: Diagnosis not present

## 2016-04-11 DIAGNOSIS — O99333 Smoking (tobacco) complicating pregnancy, third trimester: Secondary | ICD-10-CM | POA: Diagnosis not present

## 2016-04-11 DIAGNOSIS — Z3A30 30 weeks gestation of pregnancy: Secondary | ICD-10-CM

## 2016-04-11 DIAGNOSIS — O360131 Maternal care for anti-D [Rh] antibodies, third trimester, fetus 1: Secondary | ICD-10-CM

## 2016-04-11 DIAGNOSIS — O0993 Supervision of high risk pregnancy, unspecified, third trimester: Secondary | ICD-10-CM | POA: Diagnosis not present

## 2016-04-11 DIAGNOSIS — O26899 Other specified pregnancy related conditions, unspecified trimester: Secondary | ICD-10-CM

## 2016-04-11 DIAGNOSIS — Z331 Pregnant state, incidental: Secondary | ICD-10-CM | POA: Diagnosis not present

## 2016-04-11 DIAGNOSIS — O26892 Other specified pregnancy related conditions, second trimester: Secondary | ICD-10-CM

## 2016-04-11 LAB — POCT URINALYSIS DIPSTICK
Blood, UA: NEGATIVE
Glucose, UA: NEGATIVE
Ketones, UA: NEGATIVE
Leukocytes, UA: NEGATIVE
Nitrite, UA: NEGATIVE
Protein, UA: NEGATIVE

## 2016-04-11 LAB — GC/CHLAMYDIA PROBE AMP (~~LOC~~) NOT AT ARMC
Chlamydia: NEGATIVE
Neisseria Gonorrhea: NEGATIVE

## 2016-04-11 MED ORDER — RHO D IMMUNE GLOBULIN 1500 UNIT/2ML IJ SOSY
300.0000 ug | PREFILLED_SYRINGE | Freq: Once | INTRAMUSCULAR | Status: AC
  Administered 2016-04-11: 300 ug via INTRAMUSCULAR

## 2016-04-11 NOTE — Progress Notes (Signed)
[id number] Estimated Date of Delivery: 06/25/16  Blood pressure 124/70, pulse (!) 112, weight 180 lb (81.6 kg), last menstrual period 07/19/2015.   BP weight and urine results all reviewed and noted.  Please refer to the obstetrical flow sheet for the fundal height and fetal heart rate documentation:  Patient reports good fetal movement, denies any bleeding and no rupture of membranes symptoms or regular contractions. Patient went to MAU last night d/t:   1 ~15 contractions per day.  2. Blood pressure 134/110 and 142/102 on her home blood pressure cuff. Has a history of preeclampsia in previous pregnancy, but no elevated blood pressures this pregnancy on review of her prenatal records. 3. Fever of 101.7 at home today. Has not had any antipyretics in the past 24 hours. 4. Elevated pulse at home today when checking blood pressure 5. Leaking small amount of clear fluid upon arrival to maternity admissions today.  Everything was completely normal at MAU w/exception of sl erythema around sutures.  Treated w/Bactrim.   All questions were answered.  Orders Placed This Encounter  Procedures  . POCT urinalysis dipstick    Plan:  Continued routine obstetrical care, rhogam today   Return in about 1 week (around 04/18/2016) for suture removal.

## 2016-04-13 ENCOUNTER — Telehealth: Payer: Self-pay | Admitting: *Deleted

## 2016-04-13 NOTE — Telephone Encounter (Signed)
Pt states she has stitches in her arm that are coming out, c/o drainage, no fever, no redness. Pt offered an appt to be seen if she can come in now. Pt states she is eating and will have to wait until she finishes before she can be seen. Pt informed per Cyril MourningJennifer Griffin, NP needs to go to ER to be evaluated. Pt verbalized understanding.

## 2016-04-18 ENCOUNTER — Ambulatory Visit (INDEPENDENT_AMBULATORY_CARE_PROVIDER_SITE_OTHER): Payer: Medicaid Other | Admitting: Women's Health

## 2016-04-18 ENCOUNTER — Encounter: Payer: Self-pay | Admitting: Women's Health

## 2016-04-18 VITALS — BP 138/78 | HR 72 | Wt 176.0 lb

## 2016-04-18 DIAGNOSIS — O99343 Other mental disorders complicating pregnancy, third trimester: Secondary | ICD-10-CM

## 2016-04-18 DIAGNOSIS — Z331 Pregnant state, incidental: Secondary | ICD-10-CM | POA: Diagnosis not present

## 2016-04-18 DIAGNOSIS — Z4802 Encounter for removal of sutures: Secondary | ICD-10-CM | POA: Diagnosis not present

## 2016-04-18 DIAGNOSIS — O99333 Smoking (tobacco) complicating pregnancy, third trimester: Secondary | ICD-10-CM | POA: Diagnosis not present

## 2016-04-18 DIAGNOSIS — O0993 Supervision of high risk pregnancy, unspecified, third trimester: Secondary | ICD-10-CM | POA: Diagnosis not present

## 2016-04-18 DIAGNOSIS — Z3A31 31 weeks gestation of pregnancy: Secondary | ICD-10-CM

## 2016-04-18 DIAGNOSIS — Z1389 Encounter for screening for other disorder: Secondary | ICD-10-CM | POA: Diagnosis not present

## 2016-04-18 DIAGNOSIS — O26843 Uterine size-date discrepancy, third trimester: Secondary | ICD-10-CM

## 2016-04-18 LAB — POCT URINALYSIS DIPSTICK
Blood, UA: NEGATIVE
Glucose, UA: NEGATIVE
Ketones, UA: NEGATIVE
Leukocytes, UA: NEGATIVE
Nitrite, UA: NEGATIVE
Protein, UA: NEGATIVE

## 2016-04-18 NOTE — Patient Instructions (Addendum)
Call the office ([phone]) or go to [facility] if:  You begin to have strong, frequent contractions  Your water breaks.  Sometimes it is a big gush of fluid, sometimes it is just a trickle that keeps getting your panties wet or running down your legs  You have vaginal bleeding.  It is normal to have a small amount of spotting if your cervix was checked.   You don't feel your baby moving like normal.  If you don't, get you something to eat and drink and lay down and focus on feeling your baby move.  You should feel at least 10 movements in 2 hours.  If you don't, you should call the office or go to [facility].    Preterm Labor Information Preterm labor is when labor starts at less than 37 weeks of pregnancy. The normal length of a pregnancy is 39 to 41 weeks. CAUSES Often, there is no identifiable underlying cause as to why a woman goes into preterm labor. One of the most common known causes of preterm labor is infection. Infections of the uterus, cervix, vagina, amniotic sac, bladder, kidney, or even the lungs (pneumonia) can cause labor to start. Other suspected causes of preterm labor include:   Urogenital infections, such as yeast infections and bacterial vaginosis.   Uterine abnormalities (uterine shape, uterine septum, fibroids, or bleeding from the placenta).   A cervix that has been operated on (it may fail to stay closed).   Malformations in the fetus.   Multiple gestations (twins, triplets, and so on).   Breakage of the amniotic sac.  RISK FACTORS  Having a previous history of preterm labor.   Having premature rupture of membranes (PROM).   Having a placenta that covers the opening of the cervix (placenta previa).   Having a placenta that separates from the uterus (placental abruption).   Having a cervix that is too weak to hold the fetus in the uterus (incompetent cervix).   Having too much fluid in the amniotic sac (polyhydramnios).   Taking  illegal drugs or smoking while pregnant.   Not gaining enough weight while pregnant.   Being younger than 18 and older than 23 years old.   Having a low socioeconomic status.   Being African American. SYMPTOMS Signs and symptoms of preterm labor include:   Menstrual-like cramps, abdominal pain, or back pain.  Uterine contractions that are regular, as frequent as six in an hour, regardless of their intensity (may be mild or painful).  Contractions that start on the top of the uterus and spread down to the lower abdomen and back.   A sense of increased pelvic pressure.   A watery or bloody mucus discharge that comes from the vagina.  TREATMENT Depending on the length of the pregnancy and other circumstances, your health care provider may suggest bed rest. If necessary, there are medicines that can be given to stop contractions and to mature the fetal lungs. If labor happens before 34 weeks of pregnancy, a prolonged hospital stay may be recommended. Treatment depends on the condition of both you and the fetus.  WHAT SHOULD YOU DO IF YOU THINK YOU ARE IN PRETERM LABOR? Call your health care provider right away. You will need to go to the hospital to get checked immediately. HOW CAN YOU PREVENT PRETERM LABOR IN FUTURE PREGNANCIES? You should:   Stop smoking if you smoke.  Maintain healthy weight gain and avoid chemicals and drugs that are not necessary.  Be watchful for   any type of infection.  Inform your health care provider if you have a known history of preterm labor.   This information is not intended to replace advice given to you by your health care provider. Make sure you discuss any questions you have with your health care provider.   Document Released: 09/08/2003 Document Revised: 02/18/2013 Document Reviewed: 07/21/2012 Elsevier Interactive Patient Education 2016 Elsevier Inc.  

## 2016-04-18 NOTE — Progress Notes (Signed)
   [facility] Visit  Patient name: Meagan [name] MRN [id number]  Date of birth: 11/23/1990  CC & HPI:  Meagan PellegriniCatherine P Batdorf is a 25 y.o. 703P2002 Caucasian female at 7434w2d presenting today for suture removal Rt elbow. Fell down stairs into glass bowl 04/09/16, went to [facility] and got sutures. +FM, no vb, lof, uc's. Knot on lower Rt forearm, tender, but doesn't hurt to move arm.  Patient's last menstrual period was 07/19/2015.  Pertinent History Reviewed:  Medical & Surgical Hx:   Past medical, surgical, family, and social history reviewed in electronic medical record Medications: Reviewed & Updated - see associated section Allergies: Reviewed in electronic medical record  Objective Findings:  Vitals: BP 138/78 (BP Location: Left Arm, Patient Position: Sitting, Cuff Size: Normal)   Pulse 72   Wt 176 lb (79.8 kg)   LMP 07/19/2015   BMI 32.19 kg/m  Body mass index is 32.19 kg/m.  Physical Examination: General appearance - alert, well appearing, and in no distress ~1.5cm raised knot Rt forearm, slightly tender, no erythema/edema, moves arm/wrist w/o difficulty All sutures removed w/o difficulty from Rt elbow FH: 33cm FHT: 135 via doppler  Results for orders placed or performed in visit on 04/18/16 (from the past 24 hour(s))  POCT urinalysis dipstick   Collection Time: 04/18/16  4:30 PM  Result Value Ref Range   Color, UA     Clarity, UA     Glucose, UA neg    Bilirubin, UA     Ketones, UA neg    Spec Grav, UA     Blood, UA neg    pH, UA     Protein, UA neg    Urobilinogen, UA     Nitrite, UA neg    Leukocytes, UA Negative Negative     Assessment & Plan:  A:   7034w2d pregnant  Suture removal  P:   Return for tomorrow or friday for efw/afi u/s and f/u w/ [facility] for meds. and check knot Rt forearm  Marge DuncansBooker, Kimberly Randall CNM, [facility]NP[facility]BC 04/18/2016 4:47 PM

## 2016-04-20 ENCOUNTER — Ambulatory Visit (INDEPENDENT_AMBULATORY_CARE_PROVIDER_SITE_OTHER): Payer: Medicaid Other

## 2016-04-20 ENCOUNTER — Encounter: Payer: Self-pay | Admitting: Obstetrics & Gynecology

## 2016-04-20 ENCOUNTER — Ambulatory Visit (INDEPENDENT_AMBULATORY_CARE_PROVIDER_SITE_OTHER): Payer: Medicaid Other | Admitting: Obstetrics & Gynecology

## 2016-04-20 VITALS — BP 100/60 | HR 80 | Wt 182.0 lb

## 2016-04-20 DIAGNOSIS — O99343 Other mental disorders complicating pregnancy, third trimester: Secondary | ICD-10-CM | POA: Diagnosis not present

## 2016-04-20 DIAGNOSIS — O321XX1 Maternal care for breech presentation, fetus 1: Secondary | ICD-10-CM

## 2016-04-20 DIAGNOSIS — Z1389 Encounter for screening for other disorder: Secondary | ICD-10-CM

## 2016-04-20 DIAGNOSIS — Z3A31 31 weeks gestation of pregnancy: Secondary | ICD-10-CM

## 2016-04-20 DIAGNOSIS — Z331 Pregnant state, incidental: Secondary | ICD-10-CM

## 2016-04-20 DIAGNOSIS — O26843 Uterine size-date discrepancy, third trimester: Secondary | ICD-10-CM

## 2016-04-20 DIAGNOSIS — Z3492 Encounter for supervision of normal pregnancy, unspecified, second trimester: Secondary | ICD-10-CM

## 2016-04-20 DIAGNOSIS — O99333 Smoking (tobacco) complicating pregnancy, third trimester: Secondary | ICD-10-CM

## 2016-04-20 LAB — POCT URINALYSIS DIPSTICK
Blood, UA: NEGATIVE
Glucose, UA: NEGATIVE
Ketones, UA: NEGATIVE
Leukocytes, UA: NEGATIVE
Nitrite, UA: NEGATIVE
Protein, UA: NEGATIVE

## 2016-04-20 MED ORDER — ALPRAZOLAM 0.25 MG PO TABS: 0.2500 mg | ORAL_TABLET | Freq: Three times a day (TID) | ORAL | 0 refills | Status: DC | PRN

## 2016-04-20 NOTE — Progress Notes (Signed)
US 30+4 wks,breech,post pl gr 3 pl,bilat adnexa's wnl,afi 24 cm,fhr 129 bpm,efw 1680 g 56%

## 2016-04-20 NOTE — Progress Notes (Signed)
[id number] 1766w4d Estimated Date of Delivery: 06/25/16  Blood pressure 100/60, pulse 80, weight 182 lb (82.6 kg), last menstrual period 07/19/2015, unknown if currently breastfeeding.   BP weight and urine results all reviewed and noted.  Please refer to the obstetrical flow sheet for the fundal height and fetal heart rate documentation:  Patient reports good fetal movement, denies any bleeding and no rupture of membranes symptoms or regular contractions. Patient is without complaints. All questions were answered.  Orders Placed This Encounter  Procedures  . POCT urinalysis dipstick    Plan:  Continued routine obstetrical care,   Return in about 2 weeks (around 05/04/2016) for LROB.

## 2016-05-02 ENCOUNTER — Ambulatory Visit (INDEPENDENT_AMBULATORY_CARE_PROVIDER_SITE_OTHER): Payer: Medicaid Other | Admitting: Women's Health

## 2016-05-02 ENCOUNTER — Other Ambulatory Visit: Payer: Self-pay | Admitting: Obstetrics & Gynecology

## 2016-05-02 ENCOUNTER — Encounter: Payer: Self-pay | Admitting: Women's Health

## 2016-05-02 ENCOUNTER — Ambulatory Visit (INDEPENDENT_AMBULATORY_CARE_PROVIDER_SITE_OTHER): Payer: Medicaid Other

## 2016-05-02 VITALS — BP 110/80 | HR 80 | Wt 186.0 lb

## 2016-05-02 DIAGNOSIS — Z3A33 33 weeks gestation of pregnancy: Secondary | ICD-10-CM

## 2016-05-02 DIAGNOSIS — O409XX0 Polyhydramnios, unspecified trimester, not applicable or unspecified: Secondary | ICD-10-CM

## 2016-05-02 DIAGNOSIS — O99333 Smoking (tobacco) complicating pregnancy, third trimester: Secondary | ICD-10-CM | POA: Diagnosis not present

## 2016-05-02 DIAGNOSIS — O26899 Other specified pregnancy related conditions, unspecified trimester: Secondary | ICD-10-CM

## 2016-05-02 DIAGNOSIS — O0993 Supervision of high risk pregnancy, unspecified, third trimester: Secondary | ICD-10-CM

## 2016-05-02 DIAGNOSIS — O1213 Gestational proteinuria, third trimester: Secondary | ICD-10-CM

## 2016-05-02 DIAGNOSIS — O99343 Other mental disorders complicating pregnancy, third trimester: Secondary | ICD-10-CM | POA: Diagnosis not present

## 2016-05-02 DIAGNOSIS — Z1389 Encounter for screening for other disorder: Secondary | ICD-10-CM | POA: Diagnosis not present

## 2016-05-02 DIAGNOSIS — O403XX1 Polyhydramnios, third trimester, fetus 1: Secondary | ICD-10-CM

## 2016-05-02 DIAGNOSIS — O0990 Supervision of high risk pregnancy, unspecified, unspecified trimester: Secondary | ICD-10-CM

## 2016-05-02 DIAGNOSIS — O4193X0 Disorder of amniotic fluid and membranes, unspecified, third trimester, not applicable or unspecified: Secondary | ICD-10-CM

## 2016-05-02 DIAGNOSIS — Z331 Pregnant state, incidental: Secondary | ICD-10-CM

## 2016-05-02 DIAGNOSIS — Z6791 Unspecified blood type, Rh negative: Secondary | ICD-10-CM

## 2016-05-02 LAB — POCT URINALYSIS DIPSTICK
Blood, UA: NEGATIVE
Glucose, UA: NEGATIVE
Ketones, UA: NEGATIVE
Leukocytes, UA: NEGATIVE
Nitrite, UA: NEGATIVE

## 2016-05-02 NOTE — Patient Instructions (Addendum)
Call the office [phone]) [facility] if:  You begin to have strong, frequent contractions  Your water breaks.  Sometimes it is a big gush of fluid, sometimes it is just a trickle that keeps getting your panties wet or running down your legs  You have vaginal bleeding.  It is normal to have a small amount of spotting if your cervix was checked.   You don't feel your baby moving like normal.  If you don't, get you something to eat and drink and lay down and focus on feeling your baby move.  You should feel at least 10 movements in 2 hours.  If you don't, you should call the office or [facility]     Preterm Labor Information Preterm labor is when labor starts at less than 37 weeks of pregnancy. The normal length of a pregnancy is 39 to 41 weeks. CAUSES Often, there is no identifiable underlying cause as to why a woman goes into preterm labor. One of the most common known causes of preterm labor is infection. Infections of the uterus, cervix, vagina, amniotic sac, bladder, kidney, or even the lungs (pneumonia) can cause labor to start. Other suspected causes of preterm labor include:   Urogenital infections, such as yeast infections and bacterial vaginosis.   Uterine abnormalities (uterine shape, uterine septum, fibroids, or bleeding from the placenta).   A cervix that has been operated on (it may fail to stay closed).   Malformations in the fetus.   Multiple gestations (twins, triplets, and so on).   Breakage of the amniotic sac.  RISK FACTORS  Having a previous history of preterm labor.   Having premature rupture of membranes (PROM).   Having a placenta that covers the opening of the cervix (placenta previa).   Having a placenta that separates from the uterus (placental abruption).   Having a cervix that is too weak to hold the fetus in the uterus (incompetent cervix).   Having too much fluid in the amniotic sac (polyhydramnios).    Taking illegal drugs or smoking while pregnant.   Not gaining enough weight while pregnant.   Being younger than 9118 and older than 23 years old.   Having a low socioeconomic status.   Being African American. SYMPTOMS Signs and symptoms of preterm labor include:   Menstrual-like cramps, abdominal pain, or back pain.  Uterine contractions that are regular, as frequent as six in an hour, regardless of their intensity (may be mild or painful).  Contractions that start on the top of the uterus and spread down to the lower abdomen and back.   A sense of increased pelvic pressure.   A watery or bloody mucus discharge that comes from the vagina.  TREATMENT Depending on the length of the pregnancy and other circumstances, your health care provider may suggest bed rest. If necessary, there are medicines that can be given to stop contractions and to mature the fetal lungs. If labor happens before 34 weeks of pregnancy, a prolonged hospital stay may be recommended. Treatment depends on the condition of both you and the fetus.  WHAT SHOULD YOU DO IF YOU THINK YOU ARE IN PRETERM LABOR? Call your health care provider right away. You will need to go to the hospital to get checked immediately. HOW CAN YOU PREVENT PRETERM LABOR IN FUTURE PREGNANCIES? You should:   Stop smoking if you smoke.  Maintain healthy weight gain and avoid chemicals and drugs that are not necessary.  Be watchful  for any type of infection.  Inform your health care provider if you have a known history of preterm labor.   This information is not intended to replace advice given to you by your health care provider. Make sure you discuss any questions you have with your health care provider.   Document Released: 09/08/2003 Document Revised: 02/18/2013 Document Reviewed: 07/21/2012 Elsevier Interactive Patient Education [employer]   Polyhydramnios When a woman becomes pregnant, a sac is formed around  the fertilized egg (embryo) and later the growing baby (fetus). This sac is called the amniotic sac. The amniotic sac is filled with fluid. It gets bigger as the pregnancy grows. When there is too much fluid in the sac, it is called polyhydramnios. All babies born with polyhydramnios should be looked at for congenital abnormalities. The amniotic fluid cushions and protects the baby. It also provides the baby with fluids and is crucial to normal development. Your baby breathes this fluid into its lungs and swallows it. This helps promote the healthy growth of the lungs and gastrointestinal tract. Amniotic fluid also helps the baby move around, helping with the normal development of muscle and bone.  CAUSES   Diabetes mellitus.  Downs Syndrome, fetal abnormalities of the intestinal tract and anencephaly (the fetus has no brain) can prevent the fetus from swallowing the amniotic fluid.  One twins passes (transfuses) their blood into the other twin (twin-twin transfusion syndrome).  Medical illness of the mother or heart.  Kidney disease.  Tumor (chorioangioma) of the placenta. SYMPTOMS   The uterus enlarges beyond the size it should be for that particular time of the pregnancy.  The mother may feel more pressure and discomfort than should be expected.  The mother may notice a quick and unexpected enlargement of her stomach. DIAGNOSIS   Your health care provider notices your uterus is beyond the size that is consistent with the time of the pregnancy when he or she measures you.  An ultrasound is then used (abdominally or vaginally) to see if there are twins or more, measure the growth of the baby, looks for birth defects and measures the amount of fluid in the amniotic sac.  Amniotic Fluid Index (AFI). AFI measures the amount of fluid in the amniotic sac in four different areas. If there is more than 24 centimeters, you have polyhydramnios. TREATMENT   Removing some fluid from the amniotic  sac.  Take medications that lower the fluids in your body.  Stop using salt or salty foods because it causes you to keep fluid in your body (retention).  If your health care provider thinks you have polyhydramnios, you will likely need more testing. You will be watched for the rest of your pregnancy. HOME CARE INSTRUCTIONS   Keep all your prenatal appointments. Follow your health care provider's recommendations.  Do not eat a lot of salt and salty foods.  If you have diabetes, keep in it control.  If you have heart or kidney disease, treat the disease as advised by your health care provider. SEEK MEDICAL CARE IF:   You think your uterus has grown too fast in a short period of time.  You feel a great amount of pressure in your lower belly (pelvis) and are more uncomfortable than expected. SEEK IMMEDIATE MEDICAL CARE IF:   You have a gush of fluid or are leaking fluid from your vagina.  You stop feeling the baby move.  You do not feel the baby kicking as much as usual.  You have a hard time keeping your diabetes under control.  You are having problems with your heart or kidney disease.   This information is not intended to replace advice given to you by your health care provider. Make sure you discuss any questions you have with your health care provider.   Document Released: 09/08/2002 Document Revised: 04/08/2013 Document Reviewed: 01/15/2013 Elsevier Interactive Patient Education [employer]2016 Elsevier Inc.

## 2016-05-02 NOTE — Progress Notes (Addendum)
US 32+2 wks,cephalic,post pl gr 3,normal ov's bilat,afi 25.1 cm polyhydramnios,fhr 144 bpm

## 2016-05-02 NOTE — Progress Notes (Signed)
High Risk Pregnancy Diagnosis(es): polyhydramnios dx today [id number] 7035w2d Estimated Date of Delivery: 06/25/16 BP 110/80   Pulse 80   Wt 186 lb (84.4 kg)   LMP 07/19/2015   BMI 34.02 kg/m   Urinalysis: Positive for tr protein HPI:  Doing well. Had dream her water broke and cord prolapsed- discussed what to do in this situation.  BP, weight, and urine reviewed.  Reports good fm. Denies regular uc's, lof, vb, uti s/s. No complaints.  Fundal Height:  41 Fetal Heart rate:  144 u/s Edema:  trace  Reviewed today's u/s: afi now 25.1cm diagnostic for polyhydramnios All questions were answered Assessment: 5335w2d polyhydramnios Medication(s) Plans:  None for now Treatment Plan:  2x/wk testing, IOL @ 39wks Follow up on Friday for high-risk OB appt and NST

## 2016-05-03 ENCOUNTER — Encounter: Payer: Medicaid Other | Admitting: Women's Health

## 2016-05-03 ENCOUNTER — Other Ambulatory Visit: Payer: Medicaid Other

## 2016-05-04 ENCOUNTER — Encounter: Payer: Self-pay | Admitting: Women's Health

## 2016-05-04 ENCOUNTER — Ambulatory Visit (INDEPENDENT_AMBULATORY_CARE_PROVIDER_SITE_OTHER): Payer: Medicaid Other | Admitting: Women's Health

## 2016-05-04 VITALS — BP 126/52 | HR 80 | Wt 185.0 lb

## 2016-05-04 DIAGNOSIS — O0993 Supervision of high risk pregnancy, unspecified, third trimester: Secondary | ICD-10-CM

## 2016-05-04 DIAGNOSIS — O409XX0 Polyhydramnios, unspecified trimester, not applicable or unspecified: Secondary | ICD-10-CM

## 2016-05-04 DIAGNOSIS — Z331 Pregnant state, incidental: Secondary | ICD-10-CM | POA: Diagnosis not present

## 2016-05-04 DIAGNOSIS — O403XX1 Polyhydramnios, third trimester, fetus 1: Secondary | ICD-10-CM | POA: Diagnosis not present

## 2016-05-04 DIAGNOSIS — Z1389 Encounter for screening for other disorder: Secondary | ICD-10-CM

## 2016-05-04 DIAGNOSIS — O0990 Supervision of high risk pregnancy, unspecified, unspecified trimester: Secondary | ICD-10-CM

## 2016-05-04 DIAGNOSIS — Z3A33 33 weeks gestation of pregnancy: Secondary | ICD-10-CM | POA: Diagnosis not present

## 2016-05-04 LAB — POCT URINALYSIS DIPSTICK
Blood, UA: NEGATIVE
Glucose, UA: NEGATIVE
Ketones, UA: NEGATIVE
Nitrite, UA: NEGATIVE
Protein, UA: NEGATIVE

## 2016-05-04 NOTE — Patient Instructions (Signed)
Call the office ([phone]) or go to [facility] if:  You begin to have strong, frequent contractions  Your water breaks.  Sometimes it is a big gush of fluid, sometimes it is just a trickle that keeps getting your panties wet or running down your legs  You have vaginal bleeding.  It is normal to have a small amount of spotting if your cervix was checked.   You don't feel your baby moving like normal.  If you don't, get you something to eat and drink and lay down and focus on feeling your baby move.  You should feel at least 10 movements in 2 hours.  If you don't, you should call the office or go to [facility].    Preterm Labor Information Preterm labor is when labor starts at less than 37 weeks of pregnancy. The normal length of a pregnancy is 39 to 41 weeks. CAUSES Often, there is no identifiable underlying cause as to why a woman goes into preterm labor. One of the most common known causes of preterm labor is infection. Infections of the uterus, cervix, vagina, amniotic sac, bladder, kidney, or even the lungs (pneumonia) can cause labor to start. Other suspected causes of preterm labor include:   Urogenital infections, such as yeast infections and bacterial vaginosis.   Uterine abnormalities (uterine shape, uterine septum, fibroids, or bleeding from the placenta).   A cervix that has been operated on (it may fail to stay closed).   Malformations in the fetus.   Multiple gestations (twins, triplets, and so on).   Breakage of the amniotic sac.  RISK FACTORS  Having a previous history of preterm labor.   Having premature rupture of membranes (PROM).   Having a placenta that covers the opening of the cervix (placenta previa).   Having a placenta that separates from the uterus (placental abruption).   Having a cervix that is too weak to hold the fetus in the uterus (incompetent cervix).   Having too much fluid in the amniotic sac (polyhydramnios).   Taking  illegal drugs or smoking while pregnant.   Not gaining enough weight while pregnant.   Being younger than 18 and older than 23 years old.   Having a low socioeconomic status.   Being African American. SYMPTOMS Signs and symptoms of preterm labor include:   Menstrual-like cramps, abdominal pain, or back pain.  Uterine contractions that are regular, as frequent as six in an hour, regardless of their intensity (may be mild or painful).  Contractions that start on the top of the uterus and spread down to the lower abdomen and back.   A sense of increased pelvic pressure.   A watery or bloody mucus discharge that comes from the vagina.  TREATMENT Depending on the length of the pregnancy and other circumstances, your health care provider may suggest bed rest. If necessary, there are medicines that can be given to stop contractions and to mature the fetal lungs. If labor happens before 34 weeks of pregnancy, a prolonged hospital stay may be recommended. Treatment depends on the condition of both you and the fetus.  WHAT SHOULD YOU DO IF YOU THINK YOU ARE IN PRETERM LABOR? Call your health care provider right away. You will need to go to the hospital to get checked immediately. HOW CAN YOU PREVENT PRETERM LABOR IN FUTURE PREGNANCIES? You should:   Stop smoking if you smoke.  Maintain healthy weight gain and avoid chemicals and drugs that are not necessary.  Be watchful for   any type of infection.  Inform your health care provider if you have a known history of preterm labor.   This information is not intended to replace advice given to you by your health care provider. Make sure you discuss any questions you have with your health care provider.   Document Released: 09/08/2003 Document Revised: 02/18/2013 Document Reviewed: 07/21/2012 Elsevier Interactive Patient Education 2016 Elsevier Inc.  

## 2016-05-04 NOTE — Progress Notes (Signed)
High Risk Pregnancy Diagnosis(es): polyhydramnios G3P2002 2671w4d Estimated Date of Delivery: 06/25/16 BP (!) 126/52   Pulse 80   Wt 185 lb (83.9 kg)   LMP 07/19/2015   BMI 33.84 kg/m   Urinalysis: Positive for sm leuks HPI:  Anxious about delivery, scared of dying to point she cries about it. Discussed in depth that she will be very well taken care of and monitored continuously, CNMs and MDs are in-house and prepared for emergencies if they arise.  BP, weight, and urine reviewed.  Reports good fm. Denies regular uc's, lof, vb, uti s/s. No complaints.  Fundal Height:  40 Fetal Heart rate:  135, reactive NST Edema:  trace  Reviewed ptl s/s, fkc All questions were answered Assessment: 4871w4d polyhydramnios Medication(s) Plans:  Is on xanax 0.25mg  tid prn for anxiety Treatment Plan:  U/S @ 36wks   2x/wk testing   Deliver @ 39wks Follow up on Monday (pt unable to come Tues) for high-risk OB appt and bpp u/s w/ AFI

## 2016-05-07 ENCOUNTER — Ambulatory Visit (INDEPENDENT_AMBULATORY_CARE_PROVIDER_SITE_OTHER): Payer: Medicaid Other | Admitting: Women's Health

## 2016-05-07 ENCOUNTER — Ambulatory Visit (INDEPENDENT_AMBULATORY_CARE_PROVIDER_SITE_OTHER): Payer: Medicaid Other

## 2016-05-07 ENCOUNTER — Encounter: Payer: Self-pay | Admitting: Women's Health

## 2016-05-07 VITALS — BP 120/58 | HR 80 | Wt 187.0 lb

## 2016-05-07 DIAGNOSIS — Z1389 Encounter for screening for other disorder: Secondary | ICD-10-CM

## 2016-05-07 DIAGNOSIS — O0993 Supervision of high risk pregnancy, unspecified, third trimester: Secondary | ICD-10-CM

## 2016-05-07 DIAGNOSIS — O99343 Other mental disorders complicating pregnancy, third trimester: Secondary | ICD-10-CM

## 2016-05-07 DIAGNOSIS — O409XX0 Polyhydramnios, unspecified trimester, not applicable or unspecified: Secondary | ICD-10-CM

## 2016-05-07 DIAGNOSIS — Z3A33 33 weeks gestation of pregnancy: Secondary | ICD-10-CM

## 2016-05-07 DIAGNOSIS — O0990 Supervision of high risk pregnancy, unspecified, unspecified trimester: Secondary | ICD-10-CM

## 2016-05-07 DIAGNOSIS — O403XX1 Polyhydramnios, third trimester, fetus 1: Secondary | ICD-10-CM

## 2016-05-07 DIAGNOSIS — Z331 Pregnant state, incidental: Secondary | ICD-10-CM

## 2016-05-07 LAB — POCT URINALYSIS DIPSTICK
Blood, UA: NEGATIVE
Glucose, UA: NEGATIVE
Ketones, UA: NEGATIVE
Leukocytes, UA: NEGATIVE
Nitrite, UA: NEGATIVE
Protein, UA: NEGATIVE

## 2016-05-07 NOTE — Patient Instructions (Signed)
Call the office ([phone]) or go to [facility] if:  You begin to have strong, frequent contractions  Your water breaks.  Sometimes it is a big gush of fluid, sometimes it is just a trickle that keeps getting your panties wet or running down your legs  You have vaginal bleeding.  It is normal to have a small amount of spotting if your cervix was checked.   You don't feel your baby moving like normal.  If you don't, get you something to eat and drink and lay down and focus on feeling your baby move.  You should feel at least 10 movements in 2 hours.  If you don't, you should call the office or go to [facility].    Preterm Labor Information Preterm labor is when labor starts at less than 37 weeks of pregnancy. The normal length of a pregnancy is 39 to 41 weeks. CAUSES Often, there is no identifiable underlying cause as to why a woman goes into preterm labor. One of the most common known causes of preterm labor is infection. Infections of the uterus, cervix, vagina, amniotic sac, bladder, kidney, or even the lungs (pneumonia) can cause labor to start. Other suspected causes of preterm labor include:   Urogenital infections, such as yeast infections and bacterial vaginosis.   Uterine abnormalities (uterine shape, uterine septum, fibroids, or bleeding from the placenta).   A cervix that has been operated on (it may fail to stay closed).   Malformations in the fetus.   Multiple gestations (twins, triplets, and so on).   Breakage of the amniotic sac.  RISK FACTORS  Having a previous history of preterm labor.   Having premature rupture of membranes (PROM).   Having a placenta that covers the opening of the cervix (placenta previa).   Having a placenta that separates from the uterus (placental abruption).   Having a cervix that is too weak to hold the fetus in the uterus (incompetent cervix).   Having too much fluid in the amniotic sac (polyhydramnios).   Taking  illegal drugs or smoking while pregnant.   Not gaining enough weight while pregnant.   Being younger than 18 and older than 23 years old.   Having a low socioeconomic status.   Being African American. SYMPTOMS Signs and symptoms of preterm labor include:   Menstrual-like cramps, abdominal pain, or back pain.  Uterine contractions that are regular, as frequent as six in an hour, regardless of their intensity (may be mild or painful).  Contractions that start on the top of the uterus and spread down to the lower abdomen and back.   A sense of increased pelvic pressure.   A watery or bloody mucus discharge that comes from the vagina.  TREATMENT Depending on the length of the pregnancy and other circumstances, your health care provider may suggest bed rest. If necessary, there are medicines that can be given to stop contractions and to mature the fetal lungs. If labor happens before 34 weeks of pregnancy, a prolonged hospital stay may be recommended. Treatment depends on the condition of both you and the fetus.  WHAT SHOULD YOU DO IF YOU THINK YOU ARE IN PRETERM LABOR? Call your health care provider right away. You will need to go to the hospital to get checked immediately. HOW CAN YOU PREVENT PRETERM LABOR IN FUTURE PREGNANCIES? You should:   Stop smoking if you smoke.  Maintain healthy weight gain and avoid chemicals and drugs that are not necessary.  Be watchful for   any type of infection.  Inform your health care provider if you have a known history of preterm labor.   This information is not intended to replace advice given to you by your health care provider. Make sure you discuss any questions you have with your health care provider.   Document Released: 09/08/2003 Document Revised: 02/18/2013 Document Reviewed: 07/21/2012 Elsevier Interactive Patient Education 2016 Elsevier Inc.  

## 2016-05-07 NOTE — Progress Notes (Signed)
US 33 wks,cephalic,cx 3.8,normal ov's bilat,BPP 8/8,afi 23 cm,post pl gr 3

## 2016-05-07 NOTE — Progress Notes (Signed)
High Risk Pregnancy Diagnosis(es): Polyhydramnios G3P2002 4043w0d Estimated Date of Delivery: 06/25/16 BP (!) 120/58   Pulse 80   Wt 187 lb (84.8 kg)   LMP 07/19/2015   BMI 34.20 kg/m   Urinalysis: Negative HPI:  Swelling in legs BP, weight, and urine reviewed.  Reports good fm. Denies regular uc's, lof, vb, uti s/s. No complaints.  Fundal Height:  36 Fetal Heart rate:  148 u/s Edema:  Trace BLE  Reviewed today's u/s: bpp 8/8, AFI 23cm high-normal today. Discussed ptl s/s, fkc. Elevate legs for swelling.  All questions were answered Assessment: 3043w0d Polyhydramnios (high-normal today) Medication(s) Plans:  On low-dose xanax for anxiety Treatment Plan:  Growth U/S @ 36wks   2x/wk testing  Deliver @ 39wks Follow up in 3d for high-risk OB appt and NST

## 2016-05-10 ENCOUNTER — Other Ambulatory Visit: Payer: Medicaid Other

## 2016-05-10 ENCOUNTER — Ambulatory Visit (INDEPENDENT_AMBULATORY_CARE_PROVIDER_SITE_OTHER): Payer: Medicaid Other | Admitting: Obstetrics & Gynecology

## 2016-05-10 ENCOUNTER — Encounter: Payer: Self-pay | Admitting: Obstetrics & Gynecology

## 2016-05-10 VITALS — BP 120/60 | HR 114 | Wt 189.0 lb

## 2016-05-10 DIAGNOSIS — O409XX0 Polyhydramnios, unspecified trimester, not applicable or unspecified: Secondary | ICD-10-CM

## 2016-05-10 DIAGNOSIS — O99343 Other mental disorders complicating pregnancy, third trimester: Secondary | ICD-10-CM | POA: Diagnosis not present

## 2016-05-10 DIAGNOSIS — Z1389 Encounter for screening for other disorder: Secondary | ICD-10-CM | POA: Diagnosis not present

## 2016-05-10 DIAGNOSIS — O403XX1 Polyhydramnios, third trimester, fetus 1: Secondary | ICD-10-CM | POA: Diagnosis not present

## 2016-05-10 DIAGNOSIS — Z331 Pregnant state, incidental: Secondary | ICD-10-CM | POA: Diagnosis not present

## 2016-05-10 DIAGNOSIS — Z3A34 34 weeks gestation of pregnancy: Secondary | ICD-10-CM | POA: Diagnosis not present

## 2016-05-10 DIAGNOSIS — O0993 Supervision of high risk pregnancy, unspecified, third trimester: Secondary | ICD-10-CM | POA: Diagnosis not present

## 2016-05-10 DIAGNOSIS — O99323 Drug use complicating pregnancy, third trimester: Secondary | ICD-10-CM

## 2016-05-10 DIAGNOSIS — Z79899 Other long term (current) drug therapy: Secondary | ICD-10-CM

## 2016-05-10 DIAGNOSIS — O0990 Supervision of high risk pregnancy, unspecified, unspecified trimester: Secondary | ICD-10-CM

## 2016-05-10 LAB — POCT URINALYSIS DIPSTICK
Blood, UA: NEGATIVE
Glucose, UA: NEGATIVE
Ketones, UA: NEGATIVE
Leukocytes, UA: NEGATIVE
Nitrite, UA: NEGATIVE

## 2016-05-10 NOTE — Progress Notes (Signed)
Fetal Surveillance Testing today:  Reactive NST   High Risk Pregnancy Diagnosis(es):   Chronic benzodiazepine use, polyhydramnios  G3P2002 7566w3d Estimated Date of Delivery: 06/25/16  Blood pressure 120/60, pulse (!) 114, weight 189 lb (85.7 kg), last menstrual period 07/19/2015.  Urinalysis: Negative   HPI: The patient is being seen today for ongoing management of as above. Today she reports she can only get down to 2-3 per day   BP weight and urine results all reviewed and noted. Patient reports good fetal movement, denies any bleeding and no rupture of membranes symptoms or regular contractions.  Fundal Height:  34 Fetal Heart rate:  135 Edema:  none  Patient is without complaints other than noted in her HPI. All questions were answered.  All lab and sonogram results have been reviewed. Comments:    Assessment:  1.  Pregnancy at 6266w3d,  Estimated Date of Delivery: 06/25/16 :                          2.  Benzodiazepine use, chronic                        3.  Mild polyhydramnios  Medication(s) Plans:  Continue xanax 17 per week, which should not need refill til after Thanksgiving  Treatment Plan:  Continue twice weekly surveillance, AFI in 2 weeks for now  Return in about 4 days (around 05/14/2016) for NST, HROB. for appointment for high risk OB care  No orders of the defined types were placed in this encounter.  Orders Placed This Encounter  Procedures  . POCT Urinalysis Dipstick

## 2016-05-14 ENCOUNTER — Other Ambulatory Visit: Payer: Medicaid Other | Admitting: Obstetrics & Gynecology

## 2016-05-14 ENCOUNTER — Ambulatory Visit (INDEPENDENT_AMBULATORY_CARE_PROVIDER_SITE_OTHER): Payer: Medicaid Other | Admitting: Obstetrics & Gynecology

## 2016-05-14 ENCOUNTER — Other Ambulatory Visit: Payer: Medicaid Other

## 2016-05-14 ENCOUNTER — Encounter: Payer: Self-pay | Admitting: Obstetrics & Gynecology

## 2016-05-14 ENCOUNTER — Encounter: Payer: Medicaid Other | Admitting: Obstetrics & Gynecology

## 2016-05-14 VITALS — BP 112/80 | HR 92 | Wt 190.0 lb

## 2016-05-14 DIAGNOSIS — Z79899 Other long term (current) drug therapy: Secondary | ICD-10-CM

## 2016-05-14 DIAGNOSIS — O99343 Other mental disorders complicating pregnancy, third trimester: Secondary | ICD-10-CM | POA: Diagnosis not present

## 2016-05-14 DIAGNOSIS — O99323 Drug use complicating pregnancy, third trimester: Secondary | ICD-10-CM | POA: Diagnosis not present

## 2016-05-14 DIAGNOSIS — Z1389 Encounter for screening for other disorder: Secondary | ICD-10-CM

## 2016-05-14 DIAGNOSIS — O0990 Supervision of high risk pregnancy, unspecified, unspecified trimester: Secondary | ICD-10-CM

## 2016-05-14 DIAGNOSIS — Z331 Pregnant state, incidental: Secondary | ICD-10-CM

## 2016-05-14 DIAGNOSIS — O0993 Supervision of high risk pregnancy, unspecified, third trimester: Secondary | ICD-10-CM | POA: Diagnosis not present

## 2016-05-14 DIAGNOSIS — Z3A34 34 weeks gestation of pregnancy: Secondary | ICD-10-CM

## 2016-05-14 DIAGNOSIS — O409XX0 Polyhydramnios, unspecified trimester, not applicable or unspecified: Secondary | ICD-10-CM

## 2016-05-14 LAB — POCT URINALYSIS DIPSTICK
Blood, UA: NEGATIVE
Glucose, UA: NEGATIVE
Ketones, UA: NEGATIVE
Leukocytes, UA: NEGATIVE
Nitrite, UA: NEGATIVE
Protein, UA: NEGATIVE

## 2016-05-14 NOTE — Progress Notes (Signed)
Fetal Surveillance Testing today:  Reactive NST   High Risk Pregnancy Diagnosis(es):   Chronic benzodiazepine use, borferline polyhydramnios, 24, 25, 23 cm  G3P2002 3414w0d Estimated Date of Delivery: 06/25/16  Blood pressure 112/80, pulse 92, weight 190 lb (86.2 kg), last menstrual period 07/19/2015.  Urinalysis: Negative   HPI: The patient is being seen today for ongoing management of as above. Today she reports no complaints   BP weight and urine results all reviewed and noted. Patient reports good fetal movement, denies any bleeding and no rupture of membranes symptoms or regular contractions.  Fundal Height:  37 Fetal Heart rate:  140 Edema:  neg  Patient is without complaints other than noted in her HPI. All questions were answered.  All lab and sonogram results have been reviewed. Comments:    Assessment:  1.  Pregnancy at 4514w0d,  Estimated Date of Delivery: 06/25/16 :                          2.  Chronic benzo use, pt unable to wean further                        3.  borderline poly  Medication(s) Plans:  Xanax 0.25 mg no more than #17 per week  Treatment Plan:  Twice weekly surveillance, check fluid volume next week  No Follow-up on file. for appointment for high risk OB care  No orders of the defined types were placed in this encounter.  Orders Placed This Encounter  Procedures  . POCT urinalysis dipstick

## 2016-05-15 ENCOUNTER — Telehealth: Payer: Self-pay | Admitting: *Deleted

## 2016-05-15 NOTE — Telephone Encounter (Signed)
Pt c/o "severe dizziness to the point that she can't walk." Pt also states she is eating and drinking so she doesn't know why she is feeling this way. Pt was seen in the office yesterday all vital signs WNL.  Pt advised per Dr. Despina HiddenEure possibly inner ear, not a lot can be done,  will usually pass with time, if continues or other symptoms occur pt advised to call our office back. Pt verbalized understanding.

## 2016-05-16 ENCOUNTER — Telehealth: Payer: Self-pay | Admitting: Advanced Practice Midwife

## 2016-05-16 NOTE — Telephone Encounter (Signed)
Patient called wanting to know if she could take Mucinex for cold. Informed patient she could take that along with Tylenol if needed. Also encouraged patient to use a humidifier at night.

## 2016-05-17 ENCOUNTER — Encounter: Payer: Self-pay | Admitting: Obstetrics & Gynecology

## 2016-05-17 ENCOUNTER — Other Ambulatory Visit: Payer: Medicaid Other | Admitting: Obstetrics & Gynecology

## 2016-05-17 ENCOUNTER — Ambulatory Visit (INDEPENDENT_AMBULATORY_CARE_PROVIDER_SITE_OTHER): Payer: Medicaid Other | Admitting: Obstetrics & Gynecology

## 2016-05-17 VITALS — BP 120/60 | HR 120 | Wt 188.0 lb

## 2016-05-17 DIAGNOSIS — O0990 Supervision of high risk pregnancy, unspecified, unspecified trimester: Secondary | ICD-10-CM

## 2016-05-17 DIAGNOSIS — Z1389 Encounter for screening for other disorder: Secondary | ICD-10-CM | POA: Diagnosis not present

## 2016-05-17 DIAGNOSIS — O0993 Supervision of high risk pregnancy, unspecified, third trimester: Secondary | ICD-10-CM | POA: Diagnosis not present

## 2016-05-17 DIAGNOSIS — O403XX0 Polyhydramnios, third trimester, not applicable or unspecified: Secondary | ICD-10-CM

## 2016-05-17 DIAGNOSIS — Z3A34 34 weeks gestation of pregnancy: Secondary | ICD-10-CM | POA: Diagnosis not present

## 2016-05-17 DIAGNOSIS — Z79899 Other long term (current) drug therapy: Secondary | ICD-10-CM

## 2016-05-17 DIAGNOSIS — O99323 Drug use complicating pregnancy, third trimester: Secondary | ICD-10-CM

## 2016-05-17 DIAGNOSIS — Z331 Pregnant state, incidental: Secondary | ICD-10-CM

## 2016-05-17 LAB — POCT URINALYSIS DIPSTICK
Blood, UA: NEGATIVE
Glucose, UA: NEGATIVE
Ketones, UA: NEGATIVE
Leukocytes, UA: NEGATIVE
Nitrite, UA: NEGATIVE

## 2016-05-17 NOTE — Progress Notes (Signed)
Fetal Surveillance Testing today:  Reactive NST   High Risk Pregnancy Diagnosis(es):   Polyhydramnioa, chronic benzo abuse  G3P2002 6449w3d Estimated Date of Delivery: 06/25/16  Blood pressure 120/60, pulse (!) 120, weight 188 lb (85.3 kg), last menstrual period 07/19/2015.  Urinalysis: Negative   HPI: The patient is being seen today for ongoing management of as above. Today she reports cold symptoms   BP weight and urine results all reviewed and noted. Patient reports good fetal movement, denies any bleeding and no rupture of membranes symptoms or regular contractions.  Fundal Height:  38 Fetal Heart rate:  140 Edema:  none  Patient is without complaints other than noted in her HPI. All questions were answered.  All lab and sonogram results have been reviewed. Comments:    Assessment:  1.  Pregnancy at 7149w3d,  Estimated Date of Delivery: 06/25/16 :                          2.  Polyhydramnios, idiopathic                        3.  Chronic benzo use  Medication(s) Plans:  None today  Treatment Plan:  Twice weekly surveillance induction 39 weeks  Return in about 6 days (around 05/23/2016) for BPP/sono, HROB, with Dr Despina HiddenEure. for appointment for high risk OB care  No orders of the defined types were placed in this encounter.  Orders Placed This Encounter  Procedures  . US Fetal BPP W/O Non Stress  . US OB Follow Up  . US UA Cord Doppler  . POCT urinalysis dipstick

## 2016-05-23 ENCOUNTER — Encounter: Payer: Self-pay | Admitting: Obstetrics & Gynecology

## 2016-05-23 ENCOUNTER — Ambulatory Visit (INDEPENDENT_AMBULATORY_CARE_PROVIDER_SITE_OTHER): Payer: Medicaid Other | Admitting: Obstetrics & Gynecology

## 2016-05-23 ENCOUNTER — Ambulatory Visit (INDEPENDENT_AMBULATORY_CARE_PROVIDER_SITE_OTHER): Payer: Medicaid Other

## 2016-05-23 VITALS — BP 122/62 | HR 108 | Wt 190.0 lb

## 2016-05-23 DIAGNOSIS — O0990 Supervision of high risk pregnancy, unspecified, unspecified trimester: Secondary | ICD-10-CM

## 2016-05-23 DIAGNOSIS — Z1389 Encounter for screening for other disorder: Secondary | ICD-10-CM | POA: Diagnosis not present

## 2016-05-23 DIAGNOSIS — Z3A34 34 weeks gestation of pregnancy: Secondary | ICD-10-CM

## 2016-05-23 DIAGNOSIS — O0993 Supervision of high risk pregnancy, unspecified, third trimester: Secondary | ICD-10-CM

## 2016-05-23 DIAGNOSIS — Z3A36 36 weeks gestation of pregnancy: Secondary | ICD-10-CM | POA: Diagnosis not present

## 2016-05-23 DIAGNOSIS — Z331 Pregnant state, incidental: Secondary | ICD-10-CM

## 2016-05-23 DIAGNOSIS — O403XX0 Polyhydramnios, third trimester, not applicable or unspecified: Secondary | ICD-10-CM

## 2016-05-23 DIAGNOSIS — Z79899 Other long term (current) drug therapy: Secondary | ICD-10-CM | POA: Diagnosis not present

## 2016-05-23 LAB — POCT URINALYSIS DIPSTICK
Blood, UA: NEGATIVE
Glucose, UA: NEGATIVE
Ketones, UA: NEGATIVE
Nitrite, UA: NEGATIVE
Protein, UA: NEGATIVE

## 2016-05-23 MED ORDER — ALPRAZOLAM 0.25 MG PO TABS: 0.2500 mg | ORAL_TABLET | Freq: Two times a day (BID) | ORAL | 0 refills | Status: DC | PRN

## 2016-05-23 NOTE — Progress Notes (Signed)
US 35+2 wks,cephalic,fhr 151 bpm,BPP 8/8,post pl gr 3,afi 19.5 cm(has decreased from previous US),RI .69,.67,EFW 2770 g 56%

## 2016-05-30 ENCOUNTER — Other Ambulatory Visit: Payer: Self-pay | Admitting: Obstetrics & Gynecology

## 2016-05-30 ENCOUNTER — Ambulatory Visit (INDEPENDENT_AMBULATORY_CARE_PROVIDER_SITE_OTHER): Payer: Medicaid Other

## 2016-05-30 ENCOUNTER — Ambulatory Visit (INDEPENDENT_AMBULATORY_CARE_PROVIDER_SITE_OTHER): Payer: Medicaid Other | Admitting: Women's Health

## 2016-05-30 ENCOUNTER — Encounter: Payer: Self-pay | Admitting: Women's Health

## 2016-05-30 VITALS — BP 108/62 | HR 84 | Wt 193.0 lb

## 2016-05-30 DIAGNOSIS — Z1389 Encounter for screening for other disorder: Secondary | ICD-10-CM | POA: Diagnosis not present

## 2016-05-30 DIAGNOSIS — O0993 Supervision of high risk pregnancy, unspecified, third trimester: Secondary | ICD-10-CM

## 2016-05-30 DIAGNOSIS — O0990 Supervision of high risk pregnancy, unspecified, unspecified trimester: Secondary | ICD-10-CM

## 2016-05-30 DIAGNOSIS — Z331 Pregnant state, incidental: Secondary | ICD-10-CM

## 2016-05-30 DIAGNOSIS — Z3483 Encounter for supervision of other normal pregnancy, third trimester: Secondary | ICD-10-CM

## 2016-05-30 DIAGNOSIS — O403XX0 Polyhydramnios, third trimester, not applicable or unspecified: Secondary | ICD-10-CM

## 2016-05-30 DIAGNOSIS — O409XX0 Polyhydramnios, unspecified trimester, not applicable or unspecified: Secondary | ICD-10-CM

## 2016-05-30 DIAGNOSIS — Z3A36 36 weeks gestation of pregnancy: Secondary | ICD-10-CM

## 2016-05-30 DIAGNOSIS — Z3A35 35 weeks gestation of pregnancy: Secondary | ICD-10-CM

## 2016-05-30 LAB — POCT URINALYSIS DIPSTICK
Blood, UA: NEGATIVE
Glucose, UA: NEGATIVE
Ketones, UA: NEGATIVE
Nitrite, UA: NEGATIVE
Protein, UA: NEGATIVE

## 2016-05-30 LAB — OB RESULTS CONSOLE GBS: GBS: NEGATIVE

## 2016-05-30 NOTE — Progress Notes (Signed)
High Risk Pregnancy Diagnosis(es): polyhydramnios, chronic benzo use G3P2002 5863w2d Estimated Date of Delivery: 06/25/16 BP 108/62   Pulse 84   Wt 193 lb (87.5 kg)   LMP 07/19/2015   BMI 35.30 kg/m   Urinalysis: tr leuks HPI:  Lost some of mucous plug BP, weight, and urine reviewed.  Reports good fm. Denies regular uc's, lof, vb, uti s/s. No complaints.  Fundal Height:  40 Fetal Heart rate:  +u/s Edema:  None GBS, GC/CT collected SVE per request: 1/th/ballotable, vtx  Reviewed today's u/s, bpp 8/8, dopp normal, afi 21.6 (high-normal). Discussed ptl s/s, fkc All questions were answered Assessment: 3663w2d polyhydramnios, chronic benzo use Medication(s) Plans:  None today Treatment Plan:  2x/wk testing w/ IOL @ 39wks Follow up on Friday for high-risk OB appt and NST

## 2016-05-30 NOTE — Patient Instructions (Signed)
Call the office ([phone]) or go to [facility] if:  You begin to have strong, frequent contractions  Your water breaks.  Sometimes it is a big gush of fluid, sometimes it is just a trickle that keeps getting your panties wet or running down your legs  You have vaginal bleeding.  It is normal to have a small amount of spotting if your cervix was checked.   You don't feel your baby moving like normal.  If you don't, get you something to eat and drink and lay down and focus on feeling your baby move.  You should feel at least 10 movements in 2 hours.  If you don't, you should call the office or go to [facility].     Preterm Labor and Birth Information The normal length of a pregnancy is 39-41 weeks. Preterm labor is when labor starts before 37 completed weeks of pregnancy. What are the risk factors for preterm labor? Preterm labor is more likely to occur in women who:  Have certain infections during pregnancy such as a bladder infection, sexually transmitted infection, or infection inside the uterus (chorioamnionitis).  Have a shorter-than-normal cervix.  Have gone into preterm labor before.  Have had surgery on their cervix.  Are younger than age 17 or older than age 35.  Are African American.  Are pregnant with twins or multiple babies (multiple gestation).  Take street drugs or smoke while pregnant.  Do not gain enough weight while pregnant.  Became pregnant shortly after having been pregnant. What are the symptoms of preterm labor? Symptoms of preterm labor include:  Cramps similar to those that can happen during a menstrual period. The cramps may happen with diarrhea.  Pain in the abdomen or lower back.  Regular uterine contractions that may feel like tightening of the abdomen.  A feeling of increased pressure in the pelvis.  Increased watery or bloody mucus discharge from the vagina.  Water breaking (ruptured amniotic sac). Why is it important to  recognize signs of preterm labor? It is important to recognize signs of preterm labor because babies who are born prematurely may not be fully developed. This can put them at an increased risk for:  Long-term (chronic) heart and lung problems.  Difficulty immediately after birth with regulating body systems, including blood sugar, body temperature, heart rate, and breathing rate.  Bleeding in the brain.  Cerebral palsy.  Learning difficulties.  Death. These risks are highest for babies who are born before 34 weeks of pregnancy. How is preterm labor treated? Treatment depends on the length of your pregnancy, your condition, and the health of your baby. It may involve:  Having a stitch (suture) placed in your cervix to prevent your cervix from opening too early (cerclage).  Taking or being given medicines, such as:  Hormone medicines. These may be given early in pregnancy to help support the pregnancy.  Medicine to stop contractions.  Medicines to help mature the baby's lungs. These may be prescribed if the risk of delivery is high.  Medicines to prevent your baby from developing cerebral palsy. If the labor happens before 34 weeks of pregnancy, you may need to stay in the hospital. What should I do if I think I am in preterm labor? If you think that you are going into preterm labor, call your health care provider right away. How can I prevent preterm labor in future pregnancies? To increase your chance of having a full-term pregnancy:  Do not use any tobacco products, such   as cigarettes, chewing tobacco, and e-cigarettes. If you need help quitting, ask your health care provider.  Do not use street drugs or medicines that have not been prescribed to you during your pregnancy.  Talk with your health care provider before taking any herbal supplements, even if you have been taking them regularly.  Make sure you gain a healthy amount of weight during your pregnancy.  Watch for  infection. If you think that you might have an infection, get it checked right away.  Make sure to tell your health care provider if you have gone into preterm labor before. This information is not intended to replace advice given to you by your health care provider. Make sure you discuss any questions you have with your health care provider. Document Released: 09/08/2003 Document Revised: 11/29/2015 Document Reviewed: 11/09/2015 Elsevier Interactive Patient Education  2017 [employer]  

## 2016-05-30 NOTE — Progress Notes (Signed)
US 36+2 wks,cephalic,fhr 144 bpm,normal ov's bilat,post pl gr 3,afi 21.6 cm,BPP 8/8

## 2016-06-01 ENCOUNTER — Encounter: Payer: Self-pay | Admitting: Obstetrics & Gynecology

## 2016-06-01 ENCOUNTER — Ambulatory Visit (INDEPENDENT_AMBULATORY_CARE_PROVIDER_SITE_OTHER): Payer: Medicaid Other | Admitting: Obstetrics & Gynecology

## 2016-06-01 VITALS — BP 130/80 | HR 88 | Wt 193.0 lb

## 2016-06-01 DIAGNOSIS — O0993 Supervision of high risk pregnancy, unspecified, third trimester: Secondary | ICD-10-CM | POA: Diagnosis not present

## 2016-06-01 DIAGNOSIS — Z331 Pregnant state, incidental: Secondary | ICD-10-CM

## 2016-06-01 DIAGNOSIS — O409XX0 Polyhydramnios, unspecified trimester, not applicable or unspecified: Secondary | ICD-10-CM

## 2016-06-01 DIAGNOSIS — O99323 Drug use complicating pregnancy, third trimester: Secondary | ICD-10-CM

## 2016-06-01 DIAGNOSIS — Z1389 Encounter for screening for other disorder: Secondary | ICD-10-CM

## 2016-06-01 DIAGNOSIS — Z3A37 37 weeks gestation of pregnancy: Secondary | ICD-10-CM | POA: Diagnosis not present

## 2016-06-01 DIAGNOSIS — Z79899 Other long term (current) drug therapy: Secondary | ICD-10-CM

## 2016-06-01 LAB — POCT URINALYSIS DIPSTICK
Blood, UA: NEGATIVE
Glucose, UA: NEGATIVE
Ketones, UA: NEGATIVE
Leukocytes, UA: NEGATIVE
Nitrite, UA: NEGATIVE
Protein, UA: NEGATIVE

## 2016-06-01 LAB — STREP GP B NAA+RFLX: Strep Gp B NAA+Rflx: NEGATIVE

## 2016-06-01 LAB — GC/CHLAMYDIA PROBE AMP
Chlamydia trachomatis, NAA: NEGATIVE
Neisseria gonorrhoeae by PCR: NEGATIVE

## 2016-06-01 NOTE — Progress Notes (Signed)
Fetal Surveillance Testing today:  Reactive NST   High Risk Pregnancy Diagnosis(es):   borderline polyhydramnios  G3P2002 4120w4d Estimated Date of Delivery: 06/25/16  Blood pressure 130/80, pulse 88, weight 193 lb (87.5 kg), last menstrual period 07/19/2015.  Urinalysis: Negative   HPI: The patient is being seen today for ongoing management of borderline polyhydramnios. Today she reports some headache today   BP weight and urine results all reviewed and noted. Patient reports good fetal movement, denies any bleeding and no rupture of membranes symptoms or regular contractions.  Fundal Height:  39 Fetal Heart rate:  135 Edema:  none  Patient is without complaints other than noted in her HPI. All questions were answered.  All lab and sonogram results have been reviewed. Comments:    Assessment:  1.  Pregnancy at 420w4d,  Estimated Date of Delivery: 06/25/16 :                          2.  Bordrline polyhydramnios, idiopathic                        3.  Chronic benzodiazepene abuse  Medication(s) Plans:  Xanax 0.25 no more than 2 a day now  Treatment Plan:  Twice weekly testing, induce at 39 weeks  No Follow-up on file. for appointment for high risk OB care  No orders of the defined types were placed in this encounter.  Orders Placed This Encounter  Procedures  . POCT urinalysis dipstick

## 2016-06-04 ENCOUNTER — Telehealth: Payer: Self-pay | Admitting: Obstetrics & Gynecology

## 2016-06-04 ENCOUNTER — Encounter (HOSPITAL_COMMUNITY): Payer: Self-pay

## 2016-06-04 ENCOUNTER — Inpatient Hospital Stay (HOSPITAL_COMMUNITY)
Admission: AD | Admit: 2016-06-04 | Discharge: 2016-06-04 | Disposition: A | Discharge status: Home / Self Care | Payer: Medicaid Other | Source: Ambulatory Visit | Attending: Family Medicine | Admitting: Family Medicine

## 2016-06-04 DIAGNOSIS — Z3490 Encounter for supervision of normal pregnancy, unspecified, unspecified trimester: Secondary | ICD-10-CM | POA: Insufficient documentation

## 2016-06-04 DIAGNOSIS — O0990 Supervision of high risk pregnancy, unspecified, unspecified trimester: Secondary | ICD-10-CM

## 2016-06-04 NOTE — Telephone Encounter (Signed)
Pt c/o cramps in private area, [facility], +FM, no vaginal bleeding, no gush of fluids. Pt advised per Joellyn HaffKim Booker, CNM push lots of waters and lay on side if no improvement with cramps in private [facility]. Pt also educated on Labor precautions.

## 2016-06-04 NOTE — Discharge Instructions (Signed)
Braxton Hicks Contractions Contractions of the uterus can occur throughout pregnancy. Contractions are not always a sign that you are in labor.  WHAT ARE BRAXTON HICKS CONTRACTIONS?  Contractions that occur before labor are called Braxton Hicks contractions, or false labor. Toward the end of pregnancy (32-34 weeks), these contractions can develop more often and may become more forceful. This is not true labor because these contractions do not result in opening (dilatation) and thinning of the cervix. They are sometimes difficult to tell apart from true labor because these contractions can be forceful and people have different pain tolerances. You should not feel embarrassed if you go to the hospital with false labor. Sometimes, the only way to tell if you are in true labor is for your health care provider to look for changes in the cervix. If there are no prenatal problems or other health problems associated with the pregnancy, it is completely safe to be sent home with false labor and await the onset of true labor. HOW CAN YOU TELL THE DIFFERENCE BETWEEN TRUE AND FALSE LABOR? False Labor   The contractions of false labor are usually shorter and not as hard as those of true labor.   The contractions are usually irregular.   The contractions are often felt in the front of the lower abdomen and in the groin.   The contractions may go away when you walk around or change positions while lying down.   The contractions get weaker and are shorter lasting as time goes on.   The contractions do not usually become progressively stronger, regular, and closer together as with true labor.  True Labor   Contractions in true labor last 30-70 seconds, become very regular, usually become more intense, and increase in frequency.   The contractions do not go away with walking.   The discomfort is usually felt in the top of the uterus and spreads to the lower abdomen and low back.   True labor can be  determined by your health care provider with an exam. This will show that the cervix is dilating and getting thinner.  WHAT TO REMEMBER  Keep up with your usual exercises and follow other instructions given by your health care provider.   Take medicines as directed by your health care provider.   Keep your regular prenatal appointments.   Eat and drink lightly if you think you are going into labor.   If Braxton Hicks contractions are making you uncomfortable:   Change your position from lying down or resting to walking, or from walking to resting.   Sit and rest in a tub of warm water.   Drink 2-3 glasses of water. Dehydration may cause these contractions.   Do slow and deep breathing several times an hour.  WHEN SHOULD I SEEK IMMEDIATE MEDICAL CARE? Seek immediate medical care if:  Your contractions become stronger, more regular, and closer together.   You have fluid leaking or gushing from your vagina.   You have a fever.   You pass blood-tinged mucus.   You have vaginal bleeding.   You have continuous abdominal pain.   You have low back pain that you never had before.   You feel your baby's head pushing down and causing pelvic pressure.   Your baby is not moving as much as it used to.  This information is not intended to replace advice given to you by your health care provider. Make sure you discuss any questions you have with your health care   provider. Document Released: 06/18/2005 Document Revised: 10/10/2015 Document Reviewed: 03/30/2013 Elsevier Interactive Patient Education  2017 [employer] Introduction Patient Name: ________________________________________________ Patient Due Date: ____________________ What is a fetal movement count? A fetal movement count is the number of times that you feel your baby move during a certain amount of time. This may also be called a fetal kick count. A fetal movement count is recommended for every pregnant  woman. You may be asked to start counting fetal movements as early as week 28 of your pregnancy. Pay attention to when your baby is most active. You may notice your baby's sleep and wake cycles. You may also notice things that make your baby move more. You should do a fetal movement count:  When your baby is normally most active.  At the same time each day. A good time to count movements is while you are resting, after having something to eat and drink. How do I count fetal movements? 1. Find a quiet, comfortable area. Sit, or lie down on your side. 2. Write down the date, the start time and stop time, and the number of movements that you felt between those two times. Take this information with you to your health care visits. 3. For 2 hours, count kicks, flutters, swishes, rolls, and jabs. You should feel at least 10 movements during 2 hours. 4. You may stop counting after you have felt 10 movements. 5. If you do not feel 10 movements in 2 hours, have something to eat and drink. Then, keep resting and counting for 1 hour. If you feel at least 4 movements during that hour, you may stop counting. Contact a health care provider if:  You feel fewer than 4 movements in 2 hours.  Your baby is not moving like he or she usually does. Date: ____________ Start time: ____________ Stop time: ____________ Movements: ____________ Date: ____________ Start time: ____________ Stop time: ____________ Movements: ____________ Date: ____________ Start time: ____________ Stop time: ____________ Movements: ____________ Date: ____________ Start time: ____________ Stop time: ____________ Movements: ____________ Date: ____________ Start time: ____________ Stop time: ____________ Movements: ____________ Date: ____________ Start time: ____________ Stop time: ____________ Movements: ____________ Date: ____________ Start time: ____________ Stop time: ____________ Movements: ____________ Date: ____________ Start time:  ____________ Stop time: ____________ Movements: ____________ Date: ____________ Start time: ____________ Stop time: ____________ Movements: ____________ This information is not intended to replace advice given to you by your health care provider. Make sure you discuss any questions you have with your health care provider. Document Released: 07/18/2006 Document Revised: 02/15/2016 Document Reviewed: 07/28/2015 Elsevier Interactive Patient Education  2017 [employer] Inc[employer]  

## 2016-06-04 NOTE — MAU Note (Signed)
Recheck pt in 1 hour-if unchanged, pt may be discharged home

## 2016-06-04 NOTE — MAU Note (Signed)
Pt presents complaining of contractions every 6 minutes since 11pm. Denies leaking or bleeding. Reports decreased fetal movement.

## 2016-06-05 ENCOUNTER — Other Ambulatory Visit: Payer: Medicaid Other

## 2016-06-05 ENCOUNTER — Other Ambulatory Visit: Payer: Medicaid Other | Admitting: Obstetrics & Gynecology

## 2016-06-05 ENCOUNTER — Ambulatory Visit (INDEPENDENT_AMBULATORY_CARE_PROVIDER_SITE_OTHER): Payer: Medicaid Other | Admitting: Obstetrics & Gynecology

## 2016-06-05 ENCOUNTER — Encounter: Payer: Medicaid Other | Admitting: Obstetrics & Gynecology

## 2016-06-05 VITALS — BP 134/80 | HR 82 | Wt 192.4 lb

## 2016-06-05 DIAGNOSIS — O0993 Supervision of high risk pregnancy, unspecified, third trimester: Secondary | ICD-10-CM

## 2016-06-05 DIAGNOSIS — O409XX0 Polyhydramnios, unspecified trimester, not applicable or unspecified: Secondary | ICD-10-CM

## 2016-06-05 DIAGNOSIS — Z79899 Other long term (current) drug therapy: Secondary | ICD-10-CM

## 2016-06-05 DIAGNOSIS — Z331 Pregnant state, incidental: Secondary | ICD-10-CM | POA: Diagnosis not present

## 2016-06-05 DIAGNOSIS — O99323 Drug use complicating pregnancy, third trimester: Secondary | ICD-10-CM | POA: Diagnosis not present

## 2016-06-05 DIAGNOSIS — Z1389 Encounter for screening for other disorder: Secondary | ICD-10-CM | POA: Diagnosis not present

## 2016-06-05 DIAGNOSIS — Z3A37 37 weeks gestation of pregnancy: Secondary | ICD-10-CM

## 2016-06-05 LAB — POCT URINALYSIS DIPSTICK
Blood, UA: NEGATIVE
Glucose, UA: NEGATIVE
Ketones, UA: NEGATIVE
Leukocytes, UA: NEGATIVE
Nitrite, UA: NEGATIVE
Protein, UA: NEGATIVE

## 2016-06-05 NOTE — Progress Notes (Signed)
Fetal Surveillance Testing today:  Reactive NST   High Risk Pregnancy Diagnosis(es):   Polyhydramnios, borderline  G3P2002 4233w1d Estimated Date of Delivery: 06/25/16  Blood pressure 134/80, pulse 82, weight 192 lb 6.4 oz (87.3 kg), last menstrual period 07/19/2015.  Urinalysis: Negative   HPI: The patient is being seen today for ongoing management of polyhydramnios, borderline. Today she reports cramps, pelvic pressure   BP weight and urine results all reviewed and noted. Patient reports good fetal movement, denies any bleeding and no rupture of membranes symptoms or regular contractions.  Fundal Height:  38 Fetal Heart rate:  135 Edema:  none  Patient is without complaints other than noted in her HPI. All questions were answered.  All lab and sonogram results have been reviewed. Comments:    Assessment:  1.  Pregnancy at 8933w1d,  Estimated Date of Delivery: 06/25/16 :                          2.  Polyhydramnios                        3.    Medication(s) Plans:  Xanax .25 twice a day at most  Treatment Plan:  Twice weekly NST induce at 39 weeks  Return in about 3 days (around 06/08/2016) for NST, HROB. for appointment for high risk OB care  No orders of the defined types were placed in this encounter.  Orders Placed This Encounter  Procedures  . POCT urinalysis dipstick

## 2016-06-08 ENCOUNTER — Other Ambulatory Visit: Payer: Medicaid Other

## 2016-06-08 ENCOUNTER — Encounter: Payer: Medicaid Other | Admitting: Women's Health

## 2016-06-08 ENCOUNTER — Telehealth: Payer: Self-pay | Admitting: *Deleted

## 2016-06-08 ENCOUNTER — Other Ambulatory Visit: Payer: Medicaid Other | Admitting: Obstetrics & Gynecology

## 2016-06-08 NOTE — Telephone Encounter (Signed)
Pt states she is not able to come to her NST appt today, Per Dr.Eure can reschedule for next Tuesday. Pt given an appt for next Tuesday at 10 am.

## 2016-06-10 ENCOUNTER — Encounter (HOSPITAL_COMMUNITY): Payer: Self-pay | Admitting: *Deleted

## 2016-06-10 ENCOUNTER — Inpatient Hospital Stay (HOSPITAL_COMMUNITY)
Admission: AD | Admit: 2016-06-10 | Discharge: 2016-06-10 | Disposition: A | Discharge status: Home / Self Care | Payer: Medicaid Other | Source: Ambulatory Visit | Attending: Obstetrics & Gynecology | Admitting: Obstetrics & Gynecology

## 2016-06-10 DIAGNOSIS — O99333 Smoking (tobacco) complicating pregnancy, third trimester: Secondary | ICD-10-CM | POA: Insufficient documentation

## 2016-06-10 DIAGNOSIS — R519 Headache, unspecified: Secondary | ICD-10-CM

## 2016-06-10 DIAGNOSIS — R51 Headache: Secondary | ICD-10-CM

## 2016-06-10 DIAGNOSIS — Z79899 Other long term (current) drug therapy: Secondary | ICD-10-CM | POA: Insufficient documentation

## 2016-06-10 DIAGNOSIS — O26893 Other specified pregnancy related conditions, third trimester: Secondary | ICD-10-CM | POA: Diagnosis not present

## 2016-06-10 DIAGNOSIS — G43409 Hemiplegic migraine, not intractable, without status migrainosus: Secondary | ICD-10-CM | POA: Diagnosis not present

## 2016-06-10 DIAGNOSIS — Z3A37 37 weeks gestation of pregnancy: Secondary | ICD-10-CM | POA: Insufficient documentation

## 2016-06-10 DIAGNOSIS — O99353 Diseases of the nervous system complicating pregnancy, third trimester: Secondary | ICD-10-CM | POA: Diagnosis present

## 2016-06-10 LAB — URINALYSIS, ROUTINE W REFLEX MICROSCOPIC
Bilirubin Urine: NEGATIVE
Glucose, UA: NEGATIVE mg/dL
Hgb urine dipstick: NEGATIVE
Ketones, ur: NEGATIVE mg/dL
Nitrite: NEGATIVE
Protein, ur: NEGATIVE mg/dL
Specific Gravity, Urine: 1.008 (ref 1.005–1.030)
pH: 7 (ref 5.0–8.0)

## 2016-06-10 LAB — COMPREHENSIVE METABOLIC PANEL
ALT: 12 U/L — ABNORMAL LOW (ref 14–54)
AST: 11 U/L — ABNORMAL LOW (ref 15–41)
Albumin: 2.5 g/dL — ABNORMAL LOW (ref 3.5–5.0)
Alkaline Phosphatase: 127 U/L — ABNORMAL HIGH (ref 38–126)
Anion gap: 9 (ref 5–15)
BUN: 11 mg/dL (ref 6–20)
CO2: 18 mmol/L — ABNORMAL LOW (ref 22–32)
Calcium: 8.2 mg/dL — ABNORMAL LOW (ref 8.9–10.3)
Chloride: 107 mmol/L (ref 101–111)
Creatinine, Ser: 0.54 mg/dL (ref 0.44–1.00)
GFR calc Af Amer: >60 mL/min (ref >60)
GFR calc non Af Amer: >60 mL/min (ref >60)
Glucose, Bld: 82 mg/dL (ref 65–99)
Potassium: 3.8 mmol/L (ref 3.5–5.1)
Sodium: 134 mmol/L — ABNORMAL LOW (ref 135–145)
Total Bilirubin: 0.6 mg/dL (ref 0.3–1.2)
Total Protein: 5.3 g/dL — ABNORMAL LOW (ref 6.5–8.1)

## 2016-06-10 LAB — CBC
HCT: 32.8 % — ABNORMAL LOW (ref 36.0–46.0)
Hemoglobin: 11.3 g/dL — ABNORMAL LOW (ref 12.0–15.0)
MCH: 31.7 pg (ref 26.0–34.0)
MCHC: 34.5 g/dL (ref 30.0–36.0)
MCV: 92.1 fL (ref 78.0–100.0)
Platelets: 157 10*3/uL (ref 150–400)
RBC: 3.56 MIL/uL — ABNORMAL LOW (ref 3.87–5.11)
RDW: 14.2 % (ref 11.5–15.5)
WBC: 10.7 10*3/uL — ABNORMAL HIGH (ref 4.0–10.5)

## 2016-06-10 LAB — PROTEIN / CREATININE RATIO, URINE
Creatinine, Urine: 51 mg/dL
Total Protein, Urine: <6 mg/dL

## 2016-06-10 MED ORDER — METOCLOPRAMIDE HCL 5 MG/ML IJ SOLN
10.0000 mg | Freq: Once | INTRAMUSCULAR | Status: AC
  Administered 2016-06-10: 10 mg via INTRAVENOUS
  Filled 2016-06-10: qty 2

## 2016-06-10 MED ORDER — DEXAMETHASONE SODIUM PHOSPHATE 10 MG/ML IJ SOLN
10.0000 mg | Freq: Once | INTRAMUSCULAR | Status: AC
  Administered 2016-06-10: 10 mg via INTRAVENOUS
  Filled 2016-06-10: qty 1

## 2016-06-10 MED ORDER — DIPHENHYDRAMINE HCL 50 MG/ML IJ SOLN
25.0000 mg | Freq: Once | INTRAMUSCULAR | Status: AC
  Administered 2016-06-10: 25 mg via INTRAVENOUS
  Filled 2016-06-10: qty 1

## 2016-06-10 MED ORDER — LACTATED RINGERS IV BOLUS (SEPSIS)
1000.0000 mL | Freq: Once | INTRAVENOUS | Status: AC
  Administered 2016-06-10: 1000 mL via INTRAVENOUS

## 2016-06-10 NOTE — MAU Provider Note (Signed)
Chief Complaint:  Migraine and Anxiety   First Provider Initiated Contact with Patient 06/10/16 0340      HPI: Meagan [name] is a 25 y.o. G3P2002 at [redacted]w[redacted]d who presents to maternity admissions via EMS for onset of left sided numbness from her head/mouth area down into her left arm and into her left side and left leg associated with onset of severe headache just after the numbness started.  She has history of hemipalegic migraines and reports this is similar but worse than her previous ones.  She has had evaluation by neurology and has visits every 6 months and takes Pamelor for prevention. Her last migraine was in April. The h/a is associated with light sensitivity but not nausea or vomiting. She has tried rest, drinking more fluids, and Tylenol, but they have not helped. Her headache was worsening in intensity before arriving in MAU but has started to improve and the numbness/tingling on her left side has resolved prior to arrival.. She reports good fetal movement, denies LOF, vaginal bleeding, vaginal itching/burning, urinary symptoms, h/a, dizziness, n/v, or fever/chills.    HPI  Past Medical History: Past Medical History:  Diagnosis Date  . Abdominal cramps 12/06/2015  . Anxiety   . Asthma    inhaler at bedside  . Daily headache   . Diarrhea 06/04/2013  . Hemiplegic migraine    blurred vision, nausea, dizziness, numbness  . Kidney stones   . Mental disorder    huffed inhalants "affected brain"  . Nausea 06/04/2013  . Nausea and vomiting during pregnancy 12/06/2015  . Pregnant 06/04/2013  . Pregnant 10/17/2015  . Pyelonephritis complicating pregnancy, antepartum, first trimester 11/05/2011  . Sore throat 06/04/2013  . Stomach ulcer     Past obstetric history: OB History  Gravida Para Term Preterm AB Living  3 2 2  0 0 2  SAB TAB Ectopic Multiple Live Births  0 0 0 0 2    # Outcome Date GA Lbr Len/2nd Weight Sex Delivery Anes PTL Lv  3 Current           2 Term 02/13/14 [redacted]w[redacted]d  03:14 / 00:05 6 lb 12.1 oz (3.065 kg) F Vag-Spont EPI N LIV  1 Term 05/30/12 [redacted]w[redacted]d 15:31 / 04:14 7 lb 14 oz (3.572 kg) M Vag-Spont EPI N LIV      Past Surgical History: Past Surgical History:  Procedure Laterality Date  . no past surgery      Family History: Family History  Problem Relation Age of Onset  . Cancer Maternal Grandmother     breast  . Cancer Paternal Grandfather     lung cancer  . Migraines Father   . Hypertension Father   . Heart attack Father   . Hypercholesterolemia Father   . Heart disease Father   . Other Son     ventricular defect  . Hypertension Other     Social History: Social History  Substance Use Topics  . Smoking status: Current Every Day Smoker    Packs/day: 1.00    Years: 7.00    Types: Cigarettes  . Smokeless tobacco: Never Used  . Alcohol use No    Allergies:  Allergies  Allergen Reactions  . Codeine Nausea And Vomiting and Other (See Comments)    Vertigo  . Penicillins Rash and Other (See Comments)    Unknown:  Childhood reaction.  Has patient had a PCN reaction causing immediate rash, facial/tongue/throat swelling, SOB or lightheadedness with hypotension: Unknown Has patient had a PCN reaction  causing severe rash involving mucus membranes or skin necrosis: Unknown Has patient had a PCN reaction that required hospitalization No Has patient had a PCN reaction occurring within the last 10 years: No If all of the above answers are "NO", then may pro    Meds:  Prescriptions Prior to Admission  Medication Sig Dispense Refill Last Dose  . acetaminophen (TYLENOL) 500 MG tablet Take 1,000 mg by mouth every 6 (six) hours as needed for mild pain.    Taking  . albuterol (PROVENTIL HFA;VENTOLIN HFA) 108 (90 Base) MCG/ACT inhaler Inhale into the lungs every 6 (six) hours as needed for wheezing or shortness of breath.   Taking  . ALPRAZolam (XANAX) 0.25 MG tablet Take 1 tablet (0.25 mg total) by mouth 2 (two) times daily as needed for anxiety.  60 tablet 0 Taking  . butalbital-acetaminophen-caffeine (FIORICET, ESGIC) 50-325-40 MG tablet Take by mouth 2 (two) times daily as needed for headache.   Taking  . nortriptyline (PAMELOR) 10 MG capsule Take 4 capsules (40 mg total) by mouth at bedtime. 120 capsule 12 Taking  . omeprazole (PRILOSEC) 20 MG capsule Take 20 mg by mouth daily.   Taking  . prenatal vitamin w/FE, FA (PRENATAL 1 + 1) 27-1 MG TABS tablet Take 1 tablet by mouth daily at 12 noon. 30 each 12 Taking    ROS:  Review of Systems  Constitutional: Negative for chills, fatigue and fever.  Eyes: Positive for photophobia. Negative for visual disturbance.  Respiratory: Negative for shortness of breath.   Cardiovascular: Negative for chest pain.  Gastrointestinal: Negative for abdominal pain, nausea and vomiting.  Genitourinary: Negative for difficulty urinating, dysuria, flank pain, pelvic pain, vaginal bleeding, vaginal discharge and vaginal pain.  Neurological: Positive for numbness and headaches. Negative for dizziness.  Psychiatric/Behavioral: Negative.      I have reviewed patient's Past Medical Hx, Surgical Hx, Family Hx, Social Hx, medications and allergies.   Physical Exam  Patient Vitals for the past 24 hrs:  BP Temp Temp src Pulse Resp SpO2  06/10/16 0338 103/56 - - 96 - -  06/10/16 0258 134/78 - - 111 - -  06/10/16 0116 135/76 97.7 F (36.5 C) Oral 108 20 98 %   Constitutional: Well-developed, well-nourished female in no acute distress.  Cardiovascular: normal rate Respiratory: normal effort GI: Abd soft, non-tender, gravid appropriate for gestational age.  MS: Extremities nontender, no edema, normal ROM Neurological - alert, oriented, normal speech, no focal findings or movement disorder noted, screening mental status exam normal, neck supple without rigidity, cranial nerves II through XII intact, DTR's normal and symmetric, motor and sensory grossly normal bilaterally, normal muscle tone, no tremors,  strength 5/5 GU: Neg CVAT.     FHT:  Baseline 135 , moderate variability, accelerations present, no decelerations Contractions: rare, mild to palpation   Labs: Results for orders placed or performed during the hospital encounter of 06/10/16 (from the past 24 hour(s))  Urinalysis, Routine w reflex microscopic     Status: Abnormal   Collection Time: 06/10/16  1:48 AM  Result Value Ref Range   Color, Urine YELLOW YELLOW   APPearance HAZY (A) CLEAR   Specific Gravity, Urine 1.008 1.005 - 1.030   pH 7.0 5.0 - 8.0   Glucose, UA NEGATIVE NEGATIVE mg/dL   Hgb urine dipstick NEGATIVE NEGATIVE   Bilirubin Urine NEGATIVE NEGATIVE   Ketones, ur NEGATIVE NEGATIVE mg/dL   Protein, ur NEGATIVE NEGATIVE mg/dL   Nitrite NEGATIVE NEGATIVE   Leukocytes, UA SMALL (  A) NEGATIVE   RBC / HPF 0-5 0 - 5 RBC/hpf   WBC, UA 0-5 0 - 5 WBC/hpf   Bacteria, UA RARE (A) NONE SEEN   Squamous Epithelial / LPF 6-30 (A) NONE SEEN   Mucous PRESENT   Protein / creatinine ratio, urine     Status: None   Collection Time: 06/10/16  1:48 AM  Result Value Ref Range   Creatinine, Urine 51.00 mg/dL   Total Protein, Urine <6 mg/dL   Protein Creatinine Ratio        0.00 - 0.15 mg/mg[Cre]  CBC     Status: Abnormal   Collection Time: 06/10/16  3:13 AM  Result Value Ref Range   WBC 10.7 (H) 4.0 - 10.5 K/uL   RBC 3.56 (L) 3.87 - 5.11 MIL/uL   Hemoglobin 11.3 (L) 12.0 - 15.0 g/dL   HCT 45.432.8 (L) 09.836.0 - 11.946.0 %   MCV 92.1 78.0 - 100.0 fL   MCH 31.7 26.0 - 34.0 pg   MCHC 34.5 30.0 - 36.0 g/dL   RDW 14.714.2 82.911.5 - 56.215.5 %   Platelets 157 150 - 400 K/uL  Comprehensive metabolic panel     Status: Abnormal   Collection Time: 06/10/16  3:13 AM  Result Value Ref Range   Sodium 134 (L) 135 - 145 mmol/L   Potassium 3.8 3.5 - 5.1 mmol/L   Chloride 107 101 - 111 mmol/L   CO2 18 (L) 22 - 32 mmol/L   Glucose, Bld 82 65 - 99 mg/dL   BUN 11 6 - 20 mg/dL   Creatinine, Ser 1.300.54 0.44 - 1.00 mg/dL   Calcium 8.2 (L) 8.9 - 10.3 mg/dL    Total Protein 5.3 (L) 6.5 - 8.1 g/dL   Albumin 2.5 (L) 3.5 - 5.0 g/dL   AST 11 (L) 15 - 41 U/L   ALT 12 (L) 14 - 54 U/L   Alkaline Phosphatase 127 (H) 38 - 126 U/L   Total Bilirubin 0.6 0.3 - 1.2 mg/dL   GFR calc non Af Amer >60 >60 mL/min   GFR calc Af Amer >60 >60 mL/min   Anion gap 9 5 - 15   A/Negative/-- (05/08 1220)  Imaging:    MAU Course/MDM: I have ordered labs and reviewed results.  NST reviewed Preeclampsia labs ordered r/t symptoms but no elevated BP today. Lab results wnl. Headache cocktail given with LR x 1000 ml, Reglan 10 mg IV, Benadryl 25 mg IV, and Decadron 10 mg IV. Pt h/a significantly reduced while in MAU. Neuro exam remained wnl today Pt to take Fioricet PRN at home, [facility] Pt stable at time of discharge.   Assessment: 1. Hemiplegic migraine without status migrainosus, not intractable   2. Headache in pregnancy, antepartum, third trimester     Plan: Discharge home Labor precautions and fetal kick counts Follow-up Information    [facility] Follow up.   Why:  As scheduled, return to MAU as needed for signs of labor or emergencies Contact information: [address] [place] [phone]           Medication List    TAKE these medications   acetaminophen 500 MG tablet Commonly known as:  TYLENOL Take 1,000 mg by mouth every 6 (six) hours as needed for mild pain.   albuterol 108 (90 Base) MCG/ACT inhaler Commonly known as:  PROVENTIL HFA;VENTOLIN HFA Inhale into the lungs every 6 (six) hours as needed for wheezing or  shortness of breath.   ALPRAZolam 0.25 MG tablet Commonly known as:  XANAX Take 1 tablet (0.25 mg total) by mouth 2 (two) times daily as needed for anxiety.   butalbital-acetaminophen-caffeine 50-325-40 MG tablet Commonly known as:  FIORICET, ESGIC Take by mouth 2 (two) times daily as needed for headache.   nortriptyline 10 MG capsule Commonly known as:   PAMELOR Take 4 capsules (40 mg total) by mouth at bedtime.   omeprazole 20 MG capsule Commonly known as:  PRILOSEC Take 20 mg by mouth daily.   prenatal vitamin w/FE, FA 27-1 MG Tabs tablet Take 1 tablet by mouth daily at 12 noon.       Sharen CounterLisa Leftwich-Kirby Certified Nurse-Midwife 06/10/2016 3:50 AM

## 2016-06-10 NOTE — MAU Note (Signed)
Patient presents to mau via ems. Reports feeling as if the breath was knocked from her following a shower; endorses changes in vision, hard to focus. States has history of hemoplegic migraines. Called ems following this episode. Patient states during ems ride she began to feel numb on left arm and upper lip with weakness all over. +FM. Denies LOF, VB at this time. Endorses headache in right temple; 3/10 numeric rating for pain.

## 2016-06-10 NOTE — Discharge Instructions (Signed)
Migraine Headache A migraine headache is an intense, throbbing pain on one side or both sides of the head. Migraines may also cause other symptoms, such as nausea, vomiting, and sensitivity to light and noise. What are the causes? Doing or taking certain things may also trigger migraines, such as:  Alcohol.  Smoking.  Medicines, such as: ? Medicine used to treat chest pain (nitroglycerine). ? Birth control pills. ? Estrogen pills. ? Certain blood pressure medicines.  Aged cheeses, chocolate, or caffeine.  Foods or drinks that contain nitrates, glutamate, aspartame, or tyramine.  Physical activity.  Other things that may trigger a migraine include:  Menstruation.  Pregnancy.  Hunger.  Stress, lack of sleep, too much sleep, or fatigue.  Weather changes.  What increases the risk? The following factors may make you more likely to experience migraine headaches:  Age. Risk increases with age.  Family history of migraine headaches.  Being Caucasian.  Depression and anxiety.  Obesity.  Being a woman.  Having a hole in the heart (patent foramen ovale) or other heart problems.  What are the signs or symptoms? The main symptom of this condition is pulsating or throbbing pain. Pain may:  Happen in any area of the head, such as on one side or both sides.  Interfere with daily activities.  Get worse with physical activity.  Get worse with exposure to bright lights or loud noises.  Other symptoms may include:  Nausea.  Vomiting.  Dizziness.  General sensitivity to bright lights, loud noises, or smells.  Before you get a migraine, you may get warning signs that a migraine is developing (aura). An aura may include:  Seeing flashing lights or having blind spots.  Seeing bright spots, halos, or zigzag lines.  Having tunnel vision or blurred vision.  Having numbness or a tingling feeling.  Having trouble talking.  Having muscle weakness.  How is this  diagnosed? A migraine headache can be diagnosed based on:  Your symptoms.  A physical exam.  Tests, such as CT scan or MRI of the head. These imaging tests can help rule out other causes of headaches.  Taking fluid from the spine (lumbar puncture) and analyzing it (cerebrospinal fluid analysis, or CSF analysis).  How is this treated? A migraine headache is usually treated with medicines that:  Relieve pain.  Relieve nausea.  Prevent migraines from coming back.  Treatment may also include:  Acupuncture.  Lifestyle changes like avoiding foods that trigger migraines.  Follow these instructions at home: Medicines  Take over-the-counter and prescription medicines only as told by your health care provider.  Do not drive or use heavy machinery while taking prescription pain medicine.  To prevent or treat constipation while you are taking prescription pain medicine, your health care provider may recommend that you: ? Drink enough fluid to keep your urine clear or pale yellow. ? Take over-the-counter or prescription medicines. ? Eat foods that are high in fiber, such as fresh fruits and vegetables, whole grains, and beans. ? Limit foods that are high in fat and processed sugars, such as fried and sweet foods. Lifestyle  Avoid alcohol use.  Do not use any products that contain nicotine or tobacco, such as cigarettes and e-cigarettes. If you need help quitting, ask your health care provider.  Get at least 8 hours of sleep every night.  Limit your stress. General instructions   Keep a journal to find out what may trigger your migraine headaches. For example, write down: ? What you eat and   drink. ? How much sleep you get. ? Any change to your diet or medicines.  If you have a migraine: ? Avoid things that make your symptoms worse, such as bright lights. ? It may help to lie down in a dark, quiet room. ? Do not drive or use heavy machinery. ? Ask your health care provider  what activities are safe for you while you are experiencing symptoms.  Keep all follow-up visits as told by your health care provider. This is important. Contact a health care provider if:  You develop symptoms that are different or more severe than your usual migraine symptoms. Get help right away if:  Your migraine becomes severe.  You have a fever.  You have a stiff neck.  You have vision loss.  Your muscles feel weak or like you cannot control them.  You start to lose your balance often.  You develop trouble walking.  You faint. This information is not intended to replace advice given to you by your health care provider. Make sure you discuss any questions you have with your health care provider. Document Released: 06/18/2005 Document Revised: 01/06/2016 Document Reviewed: 12/05/2015 Elsevier Interactive Patient Education  2017 [employer]   

## 2016-06-12 ENCOUNTER — Encounter: Payer: Self-pay | Admitting: Obstetrics & Gynecology

## 2016-06-12 ENCOUNTER — Ambulatory Visit (INDEPENDENT_AMBULATORY_CARE_PROVIDER_SITE_OTHER): Payer: Medicaid Other | Admitting: Obstetrics & Gynecology

## 2016-06-12 VITALS — BP 130/80 | HR 82 | Wt 196.0 lb

## 2016-06-12 DIAGNOSIS — Z1389 Encounter for screening for other disorder: Secondary | ICD-10-CM

## 2016-06-12 DIAGNOSIS — O0993 Supervision of high risk pregnancy, unspecified, third trimester: Secondary | ICD-10-CM

## 2016-06-12 DIAGNOSIS — O403XX0 Polyhydramnios, third trimester, not applicable or unspecified: Secondary | ICD-10-CM

## 2016-06-12 DIAGNOSIS — Z331 Pregnant state, incidental: Secondary | ICD-10-CM

## 2016-06-12 DIAGNOSIS — Z3A38 38 weeks gestation of pregnancy: Secondary | ICD-10-CM | POA: Diagnosis not present

## 2016-06-12 DIAGNOSIS — O409XX0 Polyhydramnios, unspecified trimester, not applicable or unspecified: Secondary | ICD-10-CM

## 2016-06-12 LAB — POCT URINALYSIS DIPSTICK
Blood, UA: NEGATIVE
Glucose, UA: NEGATIVE
Ketones, UA: NEGATIVE
Leukocytes, UA: NEGATIVE
Nitrite, UA: NEGATIVE
Protein, UA: NEGATIVE

## 2016-06-12 NOTE — Progress Notes (Signed)
Fetal Surveillance Testing today:  Reactive NST  High Risk Pregnancy Diagnosis(es):   Polyhydramnios  G3P2002 7550w1d Estimated Date of Delivery: 06/25/16  Blood pressure 130/80, pulse 82, weight 196 lb (88.9 kg), last menstrual period 07/19/2015.  Urinalysis: Negative   HPI: The patient is being seen today for ongoing management of polyhydramnios, idiopathic. Today she reports no problems   BP weight and urine results all reviewed and noted. Patient reports good fetal movement, denies any bleeding and no rupture of membranes symptoms or regular contractions.  Fundal Height:  38 Fetal Heart rate:  135 Edema:  none  Patient is without complaints other than noted in her HPI. All questions were answered.  All lab and sonogram results have been reviewed. Comments:    Assessment:  1.  Pregnancy at 6350w1d,  Estimated Date of Delivery: 06/25/16 :                          2.  Polyhydramnios, idiopathic                        3.    Medication(s) Plans:  Xanax .25mg  BID(have weaned down quite a bit during the pregnancy)  Treatment Plan:  Induction 06/18/2016 [place], NST friday  Return in about 3 days (around 06/15/2016) for NST, HROB. for appointment for high risk OB care  No orders of the defined types were placed in this encounter.  Orders Placed This Encounter  Procedures  . POCT urinalysis dipstick

## 2016-06-12 NOTE — Treatment Plan (Signed)
   Induction Assessment Scheduling Form: [facility]ax to [facility]:  [phone]  Princella PellegriniCatherine P Soderholm                                                                                   DOB:  04/11/1991                                                            MRN:  [id number]                                                                     Phone #:   [phone]                         Provider:  [facility]  GP:  [id number]                                                            Estimated Date of Delivery: 06/25/16  Dating Criteria: First trimester sonogram    Medical Indications for induction:  Polyhydramios, mild to moderate depending on the given scan Admission Date/Time:  06/18/2016  0000([place]) Gestational age on admission:  39wod   Filed Weights   06/12/16 1400  Weight: 196 lb (88.9 kg)   HIV:  Non Reactive (09/20 0903) GBS: Negative (11/29 0000)  Cervical exam pending   Method of induction(proposed):  Choice, probably cytotec   Scheduling Provider Signature:  Lazaro ArmsEURE,Markey Deady H, MD                                            Today's Date:  06/12/2016   Scheduled with Heather 06/12/2016 3:54 PM

## 2016-06-14 ENCOUNTER — Encounter (HOSPITAL_COMMUNITY): Payer: Self-pay | Admitting: *Deleted

## 2016-06-14 ENCOUNTER — Other Ambulatory Visit: Payer: Medicaid Other | Admitting: Obstetrics & Gynecology

## 2016-06-14 ENCOUNTER — Telehealth (HOSPITAL_COMMUNITY): Payer: Self-pay | Admitting: *Deleted

## 2016-06-14 NOTE — Telephone Encounter (Signed)
Preadmission screen Pt states she takes .25mg  po bid xanax.  Occasionally takes it tid.

## 2016-06-15 ENCOUNTER — Other Ambulatory Visit: Payer: Medicaid Other | Admitting: Obstetrics and Gynecology

## 2016-06-17 ENCOUNTER — Other Ambulatory Visit: Payer: Self-pay | Admitting: Advanced Practice Midwife

## 2016-06-18 ENCOUNTER — Inpatient Hospital Stay (HOSPITAL_COMMUNITY): Payer: Medicaid Other | Admitting: Anesthesiology

## 2016-06-18 ENCOUNTER — Encounter (HOSPITAL_COMMUNITY): Payer: Self-pay

## 2016-06-18 ENCOUNTER — Inpatient Hospital Stay (HOSPITAL_COMMUNITY)
Admission: RE | Admit: 2016-06-18 | Discharge: 2016-06-19 | DRG: 775 | Disposition: A | Discharge status: Home / Self Care | Payer: Medicaid Other | Source: Ambulatory Visit | Attending: Family Medicine | Admitting: Family Medicine

## 2016-06-18 DIAGNOSIS — F17210 Nicotine dependence, cigarettes, uncomplicated: Secondary | ICD-10-CM | POA: Diagnosis present

## 2016-06-18 DIAGNOSIS — O26893 Other specified pregnancy related conditions, third trimester: Secondary | ICD-10-CM | POA: Diagnosis present

## 2016-06-18 DIAGNOSIS — O99334 Smoking (tobacco) complicating childbirth: Secondary | ICD-10-CM | POA: Diagnosis not present

## 2016-06-18 DIAGNOSIS — Z3A39 39 weeks gestation of pregnancy: Secondary | ICD-10-CM | POA: Diagnosis not present

## 2016-06-18 DIAGNOSIS — O9952 Diseases of the respiratory system complicating childbirth: Secondary | ICD-10-CM | POA: Diagnosis present

## 2016-06-18 DIAGNOSIS — Z6791 Unspecified blood type, Rh negative: Secondary | ICD-10-CM | POA: Diagnosis not present

## 2016-06-18 DIAGNOSIS — Z8249 Family history of ischemic heart disease and other diseases of the circulatory system: Secondary | ICD-10-CM | POA: Diagnosis not present

## 2016-06-18 DIAGNOSIS — O99344 Other mental disorders complicating childbirth: Secondary | ICD-10-CM | POA: Diagnosis present

## 2016-06-18 DIAGNOSIS — F419 Anxiety disorder, unspecified: Secondary | ICD-10-CM | POA: Diagnosis present

## 2016-06-18 DIAGNOSIS — J45909 Unspecified asthma, uncomplicated: Secondary | ICD-10-CM | POA: Diagnosis present

## 2016-06-18 DIAGNOSIS — O403XX0 Polyhydramnios, third trimester, not applicable or unspecified: Secondary | ICD-10-CM | POA: Diagnosis present

## 2016-06-18 LAB — CBC
HCT: 36.1 % (ref 36.0–46.0)
HCT: 35.6 % — ABNORMAL LOW (ref 36.0–46.0)
Hemoglobin: 12.6 g/dL (ref 12.0–15.0)
Hemoglobin: 12.1 g/dL (ref 12.0–15.0)
MCH: 31.7 pg (ref 26.0–34.0)
MCH: 31.6 pg (ref 26.0–34.0)
MCHC: 34.9 g/dL (ref 30.0–36.0)
MCHC: 34 g/dL (ref 30.0–36.0)
MCV: 90.9 fL (ref 78.0–100.0)
MCV: 93 fL (ref 78.0–100.0)
Platelets: 116 10*3/uL — ABNORMAL LOW (ref 150–400)
Platelets: 153 10*3/uL (ref 150–400)
RBC: 3.97 MIL/uL (ref 3.87–5.11)
RBC: 3.83 MIL/uL — ABNORMAL LOW (ref 3.87–5.11)
RDW: 14.4 % (ref 11.5–15.5)
RDW: 14.7 % (ref 11.5–15.5)
WBC: 10.9 10*3/uL — ABNORMAL HIGH (ref 4.0–10.5)
WBC: 9.2 10*3/uL (ref 4.0–10.5)

## 2016-06-18 LAB — RPR: RPR Ser Ql: NONREACTIVE

## 2016-06-18 LAB — TYPE AND SCREEN
ABO/RH(D): A NEG
Antibody Screen: NEGATIVE

## 2016-06-18 MED ORDER — ACETAMINOPHEN 325 MG PO TABS: 650.0000 mg | ORAL_TABLET | ORAL | Status: DC | PRN

## 2016-06-18 MED ORDER — PHENYLEPHRINE 40 MCG/ML (10ML) SYRINGE FOR IV PUSH (FOR BLOOD PRESSURE SUPPORT)
80.0000 ug | PREFILLED_SYRINGE | INTRAVENOUS | Status: DC | PRN
  Filled 2016-06-18: qty 5

## 2016-06-18 MED ORDER — ONDANSETRON HCL 4 MG/2ML IJ SOLN: 4.0000 mg | INTRAMUSCULAR | Status: DC | PRN

## 2016-06-18 MED ORDER — ALPRAZOLAM 0.5 MG PO TABS
0.2500 mg | ORAL_TABLET | Freq: Two times a day (BID) | ORAL | Status: DC | PRN
  Administered 2016-06-18 – 2016-06-19 (×3): 0.25 mg via ORAL
  Filled 2016-06-18 (×3): qty 1

## 2016-06-18 MED ORDER — PHENYLEPHRINE 40 MCG/ML (10ML) SYRINGE FOR IV PUSH (FOR BLOOD PRESSURE SUPPORT)
80.0000 ug | PREFILLED_SYRINGE | INTRAVENOUS | Status: DC | PRN
  Filled 2016-06-18: qty 10
  Filled 2016-06-18: qty 5

## 2016-06-18 MED ORDER — DIBUCAINE 1 % RE OINT: 1.0000 "application " | TOPICAL_OINTMENT | RECTAL | Status: DC | PRN

## 2016-06-18 MED ORDER — DIPHENHYDRAMINE HCL 25 MG PO CAPS: 25.0000 mg | ORAL_CAPSULE | Freq: Four times a day (QID) | ORAL | Status: DC | PRN

## 2016-06-18 MED ORDER — FENTANYL 2.5 MCG/ML BUPIVACAINE 1/10 % EPIDURAL INFUSION (WH - ANES)
14.0000 mL/h | INTRAMUSCULAR | Status: DC | PRN
  Administered 2016-06-18: 14 mL/h via EPIDURAL
  Filled 2016-06-18: qty 100

## 2016-06-18 MED ORDER — DIPHENHYDRAMINE HCL 50 MG/ML IJ SOLN: 12.5000 mg | INTRAMUSCULAR | Status: DC | PRN

## 2016-06-18 MED ORDER — ONDANSETRON HCL 4 MG/2ML IJ SOLN: 4.0000 mg | Freq: Four times a day (QID) | INTRAMUSCULAR | Status: DC | PRN

## 2016-06-18 MED ORDER — OXYTOCIN BOLUS FROM INFUSION
500.0000 mL | Freq: Once | INTRAVENOUS | Status: AC
  Administered 2016-06-18: 500 mL via INTRAVENOUS

## 2016-06-18 MED ORDER — LACTATED RINGERS IV SOLN: 500.0000 mL | INTRAVENOUS | Status: DC | PRN

## 2016-06-18 MED ORDER — LIDOCAINE HCL (PF) 1 % IJ SOLN
INTRAMUSCULAR | Status: DC | PRN
  Administered 2016-06-18: 6 mL via EPIDURAL
  Administered 2016-06-18: 4 mL

## 2016-06-18 MED ORDER — LIDOCAINE HCL (PF) 1 % IJ SOLN
30.0000 mL | INTRAMUSCULAR | Status: DC | PRN
  Filled 2016-06-18: qty 30

## 2016-06-18 MED ORDER — BENZOCAINE-MENTHOL 20-0.5 % EX AERO: 1.0000 "application " | INHALATION_SPRAY | CUTANEOUS | Status: DC | PRN

## 2016-06-18 MED ORDER — SENNOSIDES-DOCUSATE SODIUM 8.6-50 MG PO TABS
2.0000 | ORAL_TABLET | ORAL | Status: DC
  Administered 2016-06-19: 2 via ORAL
  Filled 2016-06-18: qty 2

## 2016-06-18 MED ORDER — FENTANYL CITRATE (PF) 100 MCG/2ML IJ SOLN: 100.0000 ug | INTRAMUSCULAR | Status: DC | PRN

## 2016-06-18 MED ORDER — PANTOPRAZOLE SODIUM 20 MG PO TBEC
20.0000 mg | DELAYED_RELEASE_TABLET | Freq: Every day | ORAL | Status: DC
  Administered 2016-06-18 – 2016-06-19 (×2): 20 mg via ORAL
  Filled 2016-06-18 (×3): qty 1

## 2016-06-18 MED ORDER — SIMETHICONE 80 MG PO CHEW: 80.0000 mg | CHEWABLE_TABLET | ORAL | Status: DC | PRN

## 2016-06-18 MED ORDER — NORTRIPTYLINE HCL 10 MG PO CAPS
40.0000 mg | ORAL_CAPSULE | Freq: Every day | ORAL | Status: DC
  Administered 2016-06-19: 20 mg via ORAL
  Filled 2016-06-18 (×2): qty 4

## 2016-06-18 MED ORDER — IBUPROFEN 600 MG PO TABS
600.0000 mg | ORAL_TABLET | Freq: Four times a day (QID) | ORAL | Status: DC
  Administered 2016-06-18 – 2016-06-19 (×4): 600 mg via ORAL
  Filled 2016-06-18 (×4): qty 1

## 2016-06-18 MED ORDER — PRENATAL MULTIVITAMIN CH
1.0000 | ORAL_TABLET | Freq: Every day | ORAL | Status: DC
  Administered 2016-06-19: 1 via ORAL
  Filled 2016-06-18: qty 1

## 2016-06-18 MED ORDER — LACTATED RINGERS IV SOLN: 500.0000 mL | Freq: Once | INTRAVENOUS | Status: DC

## 2016-06-18 MED ORDER — OXYTOCIN 40 UNITS IN LACTATED RINGERS INFUSION - SIMPLE MED: 2.5000 [IU]/h | INTRAVENOUS | Status: DC

## 2016-06-18 MED ORDER — ZOLPIDEM TARTRATE 5 MG PO TABS: 5.0000 mg | ORAL_TABLET | Freq: Every evening | ORAL | Status: DC | PRN

## 2016-06-18 MED ORDER — TETANUS-DIPHTH-ACELL PERTUSSIS 5-2.5-18.5 LF-MCG/0.5 IM SUSP: 0.5000 mL | Freq: Once | INTRAMUSCULAR | Status: DC

## 2016-06-18 MED ORDER — COCONUT OIL OIL
1.0000 "application " | TOPICAL_OIL | Status: DC | PRN
  Administered 2016-06-18: 1 via TOPICAL
  Filled 2016-06-18: qty 120

## 2016-06-18 MED ORDER — EPHEDRINE 5 MG/ML INJ
10.0000 mg | INTRAVENOUS | Status: DC | PRN
  Filled 2016-06-18: qty 4

## 2016-06-18 MED ORDER — ZOLPIDEM TARTRATE 5 MG PO TABS
5.0000 mg | ORAL_TABLET | Freq: Every evening | ORAL | Status: DC | PRN
Start: 2016-06-18 — End: 2016-06-18

## 2016-06-18 MED ORDER — SOD CITRATE-CITRIC ACID 500-334 MG/5ML PO SOLN: 30.0000 mL | ORAL | Status: DC | PRN

## 2016-06-18 MED ORDER — WITCH HAZEL-GLYCERIN EX PADS: 1.0000 "application " | MEDICATED_PAD | CUTANEOUS | Status: DC | PRN

## 2016-06-18 MED ORDER — ONDANSETRON HCL 4 MG PO TABS: 4.0000 mg | ORAL_TABLET | ORAL | Status: DC | PRN

## 2016-06-18 MED ORDER — TERBUTALINE SULFATE 1 MG/ML IJ SOLN
0.2500 mg | Freq: Once | INTRAMUSCULAR | Status: DC | PRN
  Filled 2016-06-18: qty 1

## 2016-06-18 MED ORDER — LACTATED RINGERS IV SOLN
INTRAVENOUS | Status: DC
  Administered 2016-06-18 (×2): via INTRAVENOUS

## 2016-06-18 MED ORDER — MISOPROSTOL 25 MCG QUARTER TABLET
25.0000 ug | ORAL_TABLET | ORAL | Status: DC | PRN
  Administered 2016-06-18 (×2): 25 ug via VAGINAL
  Filled 2016-06-18: qty 1
  Filled 2016-06-18 (×2): qty 0.25

## 2016-06-18 NOTE — Anesthesia Postprocedure Evaluation (Signed)
Anesthesia Post Note  Patient: Meagan [name]  Procedure(s) Performed: * No procedures listed *  Patient location during evaluation: Mother Baby Anesthesia Type: Regional Level of consciousness: awake and alert Pain management: pain level controlled Vital Signs Assessment: post-procedure vital signs reviewed and stable Respiratory status: spontaneous breathing Cardiovascular status: blood pressure returned to baseline Postop Assessment: no headache, patient able to bend at knees, no backache, no signs of nausea or vomiting, epidural receding and adequate PO intake        Last Vitals:  Vitals:   06/18/16 1614 06/18/16 1721  BP: 116/66 129/71  Pulse: 77 97  Resp: 18 18  Temp: 36.9 C 36.9 C    Last Pain:  Vitals:   06/18/16 1721  TempSrc: Oral  PainSc:    Pain Goal:                 Salome ArntSterling, Glorene Leitzke Marie

## 2016-06-18 NOTE — Anesthesia Procedure Notes (Signed)
Epidural Patient location during procedure: OB Start time: 06/18/2016 11:02 AM End time: 06/18/2016 11:13 AM  Staffing Anesthesiologist: Cristela BlueJACKSON, Chandelle Harkey  Preanesthetic Checklist Completed: patient identified, site marked, surgical consent, pre-op evaluation, timeout performed, IV checked, risks and benefits discussed and monitors and equipment checked  Epidural Patient position: sitting Prep: site prepped and draped and DuraPrep Patient monitoring: continuous pulse ox and blood pressure Approach: midline Location: L3-L4 Injection technique: LOR air  Needle:  Needle type: Tuohy  Needle gauge: 17 G Needle length: 9 cm and 9 Needle insertion depth: 6 cm Catheter type: closed end flexible Catheter size: 19 Gauge Catheter at skin depth: 12 cm Test dose: negative  Assessment Events: blood not aspirated, injection not painful, no injection resistance, negative IV test and no paresthesia  Additional Notes Dosing of Epidural:  1st dose, through catheter ............................................Marland Kitchen.  Xylocaine 40 mg  2nd dose, through catheter, after waiting 3 minutes........Marland Kitchen.Xylocaine 60 mg    As each dose occurred, patient was free of IV sx; and patient exhibited no evidence of SA injection.  Patient is more comfortable after epidural dosed. Please see RN's note for documentation of vital signs,and FHR which are stable.  Patient reminded not to try to ambulate with numb legs, and that an RN must be present when she attempts to get up.

## 2016-06-18 NOTE — Anesthesia Preprocedure Evaluation (Signed)
Anesthesia Evaluation  Patient identified by MRN, date of birth, ID band Patient awake    Reviewed: Allergy & Precautions, H&P , Patient's Chart, lab work & pertinent test results  Airway Mallampati: II TM Distance: >3 FB Neck ROM: full    Dental  (+) Teeth Intact   Pulmonary asthma , Current Smoker,  breath sounds clear to auscultation        Cardiovascular Rhythm:regular Rate:Normal     Neuro/Psych    GI/Hepatic   Endo/Other    Renal/GU      Musculoskeletal   Abdominal   Peds  Hematology   Anesthesia Other Findings       Reproductive/Obstetrics (+) Pregnancy                           Anesthesia Physical Anesthesia Plan  ASA: II  Anesthesia Plan: Epidural   Post-op Pain Management:    Induction:   Airway Management Planned:   Additional Equipment:   Intra-op Plan:   Post-operative Plan:   Informed Consent: I have reviewed the patients History and Physical, chart, labs and discussed the procedure including the risks, benefits and alternatives for the proposed anesthesia with the patient or authorized representative who has indicated his/her understanding and acceptance.   Dental Advisory Given  Plan Discussed with:   Anesthesia Plan Comments: (Labs checked- platelets confirmed with RN in room. Fetal heart tracing, per RN, reported to be stable enough for sitting procedure. Discussed epidural, and patient consents to the procedure:  included risk of possible headache,backache, failed block, allergic reaction, and nerve injury. This patient was asked if she had any questions or concerns before the procedure started.)        Anesthesia Quick Evaluation  

## 2016-06-18 NOTE — Anesthesia Pain Management Evaluation Note (Signed)
  [facility] Pain Management Visit Note  Patient: Meagan PellegriniCatherine P Abshier, 25 y.o., female  "Hello [facility] am [facility]. We have an anesthesia team available at all times to provide care throughout the hospital, including epidural management and anesthesia for C-section. I don't know your plan for the delivery whether it a natural birth, water birth, IV sedation, nitrous supplementation, doula or epidural, but we want to meet your pain goals."   1.Was your pain managed to your expectations on prior hospitalizations?   Yes   2.What is your expectation for pain management during this hospitalization?     Epidural  3.How can we help you reach that goal? epidural  Record the patient's initial score and the patient's pain goal.   Pain: 5  Pain Goal: 8 [facility] wants you to be able to say your pain was always managed very well.  [name] 06/18/2016

## 2016-06-18 NOTE — H&P (Signed)
LABOR AND DELIVERY ADMISSION HISTORY AND PHYSICAL NOTE  Meagan PellegriniCatherine P [name] is a 25 y.o. female [phone] with IUP at 2456w0d  presenting for IOL due to mild polyhydramnios.   She reports positive fetal movement. She denies leakage of fluid or vaginal bleeding.  Prenatal History/Complications: [facility]  Initiated Care at  8wks  FOB Meridee ScoreWilliam Koon  Dating By 7wk u/s  Pap 11/15/2015 neg  GC/CT Initial: -/-               36+wks:  -/-  Genetic Screen NT/IT: neg  CF screen declined  Anatomic US Normal female 'Meagan [name]'  Flu vaccine declined  Tdap Recommended ~ 28wks  Glucose Screen  2 hr normal: 73/114/105  GBS neg  Feed Preference breast  Contraception Depo vs. IUD  Circumcision n/a  Childbirth Classes declined  Pediatrician [facility]     Past Medical History: Past Medical History:  Diagnosis Date  . Abdominal cramps 12/06/2015  . Anxiety   . Asthma    inhaler at bedside  . Daily headache   . Diarrhea 06/04/2013  . Hemiplegic migraine    blurred vision, nausea, dizziness, numbness  . Kidney stones   . Mental disorder    huffed inhalants "affected brain"  . Nausea 06/04/2013  . Nausea and vomiting during pregnancy 12/06/2015  . Pregnant 06/04/2013  . Pregnant 10/17/2015  . Pyelonephritis complicating pregnancy, antepartum, first trimester 11/05/2011  . Sore throat 06/04/2013  . Stomach ulcer     Past Surgical History: Past Surgical History:  Procedure Laterality Date  . no past surgery      Obstetrical History: OB History    Gravida Para Term Preterm AB Living   3 2 2  0 0 2   SAB TAB Ectopic Multiple Live Births   0 0 0 0 2      Social History: Social History   Social History  . Marital status: Married    Spouse name: N/A  . Number of children: N/A  . Years of education: N/A   Social History Main Topics  . Smoking status: Current Every Day Smoker    Packs/day: 1.00    Years: 7.00    Types: Cigarettes  . Smokeless  tobacco: Never Used  . Alcohol use No  . Drug use: No     Comment: huffed inhalants in past, "affected Brain"  . Sexual activity: Yes    Birth control/ protection: None   Other Topics Concern  . None   Social History Narrative   Lives at home with husband and kids.   Right handed.   Six sodas per day.    Family History: Family History  Problem Relation Age of Onset  . Cancer Maternal Grandmother     breast  . Cancer Paternal Grandfather     lung cancer  . Migraines Father   . Hypertension Father   . Hypercholesterolemia Father   . Other Son     ventricular defect  . Hypertension Other     Allergies: Allergies  Allergen Reactions  . Codeine Nausea And Vomiting and Other (See Comments)    Vertigo  . Penicillins Rash and Other (See Comments)    Unknown:  Childhood reaction.  Has patient had a PCN reaction causing immediate rash, facial/tongue/throat swelling, SOB or lightheadedness with hypotension: Unknown Has patient had a PCN reaction causing severe rash involving mucus membranes or skin necrosis: Unknown Has patient had a PCN reaction that required hospitalization No Has patient had  a PCN reaction occurring within the last 10 years: No If all of the above answers are "NO", then may pro    Prescriptions Prior to Admission  Medication Sig Dispense Refill Last Dose  . acetaminophen (TYLENOL) 500 MG tablet Take 1,000 mg by mouth every 6 (six) hours as needed for mild pain.    Past Week at Unknown time  . ALPRAZolam (XANAX) 0.25 MG tablet Take 1 tablet (0.25 mg total) by mouth 2 (two) times daily as needed for anxiety. 60 tablet 0 06/17/2016 at Unknown time  . nortriptyline (PAMELOR) 10 MG capsule Take 4 capsules (40 mg total) by mouth at bedtime. 120 capsule 12 06/17/2016 at Unknown time  . prenatal vitamin w/FE, FA (PRENATAL 1 + 1) 27-1 MG TABS tablet Take 1 tablet by mouth daily at 12 noon. 30 each 12 06/17/2016 at Unknown time  . albuterol (PROVENTIL HFA;VENTOLIN  HFA) 108 (90 Base) MCG/ACT inhaler Inhale into the lungs every 6 (six) hours as needed for wheezing or shortness of breath.   Unknown at Unknown time  . butalbital-acetaminophen-caffeine (FIORICET, ESGIC) 50-325-40 MG tablet Take by mouth 2 (two) times daily as needed for headache.   Unknown at Unknown time  . omeprazole (PRILOSEC) 20 MG capsule Take 20 mg by mouth daily.   Unknown at Unknown time     Review of Systems   All systems reviewed and negative except as stated in HPI  Blood pressure 121/75, pulse (!) 118, temperature 97.9 F (36.6 C), temperature source Oral, resp. rate 18, height 5\' 2"  (1.575 m), weight 196 lb (88.9 kg), last menstrual period 07/19/2015. General appearance: alert, cooperative and no distress Lungs: clear to auscultation bilaterally Heart: regular rate and rhythm Abdomen: soft, non-tender; bowel sounds normal Extremities: No calf swelling or tenderness Presentation: cephalic Fetal monitoring: 140. Moderate variability. No decels Uterine activity: occasional     Prenatal labs: ABO, Rh: --/--/A NEG (12/18 0000) Antibody: NEG (12/18 0000) Rubella: !Error! RPR: Non Reactive (09/20 0903)  HBsAg: Negative (05/08 1220)  HIV: Non Reactive (09/20 0903)  GBS: Negative (11/29 0000)  1 hr Glucola: 114 Genetic screening:  neg Anatomy US: nml  Prenatal Transfer Tool  Maternal Diabetes: No Genetic Screening: Normal Maternal Ultrasounds/Referrals: Normal Fetal Ultrasounds or other Referrals:  None Maternal Substance Abuse:  Yes:  Type: Smoker Significant Maternal Medications:  Meds include: Other: Xanax Significant Maternal Lab Results: Lab values include: Group B Strep negative, Rh negative  Results for orders placed or performed during the hospital encounter of 06/18/16 (from the past 24 hour(s))  Type and screen   Collection Time: 06/18/16 12:00 AM  Result Value Ref Range   ABO/RH(D) A NEG    Antibody Screen NEG    Sample Expiration 06/21/2016   CBC    Collection Time: 06/18/16  1:00 AM  Result Value Ref Range   WBC 10.9 (H) 4.0 - 10.5 K/uL   RBC 3.97 3.87 - 5.11 MIL/uL   Hemoglobin 12.6 12.0 - 15.0 g/dL   HCT 16.1 09.6 - 04.5 %   MCV 90.9 78.0 - 100.0 fL   MCH 31.7 26.0 - 34.0 pg   MCHC 34.9 30.0 - 36.0 g/dL   RDW 40.9 81.1 - 91.4 %   Platelets 116 (L) 150 - 400 K/uL    Patient Active Problem List   Diagnosis Date Noted  . Polyhydramnios in third trimester 06/18/2016  . Polyhydramnios affecting pregnancy 05/02/2016  . Abdominal cramps 12/06/2015  . Diarrhea 12/06/2015  . Nausea and vomiting  during pregnancy 12/06/2015  . Hx of preeclampsia, prior pregnancy, currently pregnant 11/16/2015  . Rh negative state in antepartum period 11/16/2015  . Supervision of high risk pregnancy, antepartum 11/15/2015  . 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 Smoker 12/16/2013  . Anxiety 06/17/2013    Assessment: Meagan PellegriniCatherine P Coddington is a 25 y.o. G3P2002 at 6024w0d here for IOL due to polyhydramnios  #labor: s/p 1 dose cytotec #Pain: Planning epidural #FWB: Cat I #ID:  GBS neg #MOF: Breast #MOC:undercided #Circ:  N/a. Female  Clearance Cootsndrew Tyson 06/18/2016, 2:18 AM   CNM attestation:  I have seen and examined this patient; I agree with above documentation in the resident's note.   Meagan PellegriniCatherine P Bannan is a 25 y.o. [phone] here for IOL for mild polyhydramnios depending on exam (last elevated AFI was 11/1 of 25cm)  PE: BP 125/81 (BP Location: Right Arm)   Pulse 100   Temp 97.9 F (36.6 C) (Oral)   Resp 18   Ht 5\' 2"  (1.575 m)   Wt 88.9 kg (196 lb)   LMP 07/19/2015   BMI 35.85 kg/m  Gen: calm comfortable, NAD Resp: normal effort, no distress Abd: gravid  ROS, labs, PMH reviewed  Plan[facility] rip[facility] w/ cytotec and then progress to Pit prn Continued Xanax for anxiety Anticipate SVD  Betsi Crespi CNM 06/18/2016, 7:24 AM

## 2016-06-18 NOTE — Progress Notes (Signed)
Patient ID: Meagan [name], female   DOB: 05/06/1991, 25 y.o.   MRN: [id number] Feeling crampy after cytotec VSS, afeb FHR 120s, +accels, no decels Ctx irreg q 3-7 mins Cx post 2/60/-3 per RN exam  IUP@term  Poly IOL process  Second cytotec placed  [name], [name] CNM 06/18/2016 6:47 AM

## 2016-06-18 NOTE — Progress Notes (Signed)
Patient seen and evaluated. Doing well. Very anxious. Continue reports to the nurse she feels like she is going to die.D/W the patient that everything is reassuring at this point and there are no concerns from our stand point. Continue to expect SVD. Epidural in place. Category 1 tracing.

## 2016-06-18 NOTE — Progress Notes (Signed)
MD studentTyson at bed side for H&P.  Philipp DeputyKim Shaw CNM on unit. Rn discuss w Clelia CroftShaw Cnm pt concerns and request, pt request to eat before IOL. Pt requesting to walk halls. Pt informed she can walk in hall 1 hour after cytotec placed if NST reactive.pt inform she can not leave unit. Pt inform she may not leave unit to smoke cigarettes. All questions answered. No further question at present time.

## 2016-06-19 ENCOUNTER — Other Ambulatory Visit: Payer: Self-pay | Admitting: Obstetrics & Gynecology

## 2016-06-19 LAB — CBC
HCT: 32.7 % — ABNORMAL LOW (ref 36.0–46.0)
Hemoglobin: 11.2 g/dL — ABNORMAL LOW (ref 12.0–15.0)
MCH: 31.5 pg (ref 26.0–34.0)
MCHC: 34.3 g/dL (ref 30.0–36.0)
MCV: 92.1 fL (ref 78.0–100.0)
Platelets: 133 10*3/uL — ABNORMAL LOW (ref 150–400)
RBC: 3.55 MIL/uL — ABNORMAL LOW (ref 3.87–5.11)
RDW: 14.4 % (ref 11.5–15.5)
WBC: 10.7 10*3/uL — ABNORMAL HIGH (ref 4.0–10.5)

## 2016-06-19 MED ORDER — IBUPROFEN 600 MG PO TABS: 600.0000 mg | ORAL_TABLET | Freq: Four times a day (QID) | ORAL | 0 refills | Status: DC

## 2016-06-19 MED ORDER — SENNOSIDES-DOCUSATE SODIUM 8.6-50 MG PO TABS: 2.0000 | ORAL_TABLET | ORAL | 0 refills | Status: DC

## 2016-06-19 MED ORDER — RHO D IMMUNE GLOBULIN 1500 UNIT/2ML IJ SOSY
300.0000 ug | PREFILLED_SYRINGE | Freq: Once | INTRAMUSCULAR | Status: AC
  Administered 2016-06-19: 300 ug via INTRAVENOUS
  Filled 2016-06-19: qty 2

## 2016-06-19 NOTE — Progress Notes (Signed)
[name] was referred for history of depression/anxiety. * Referral screened out by Clinical Social Worker because none of the following criteria appear to apply: ~ History of anxiety/depression during this pregnancy, or of post-partum depression. ~ Diagnosis of anxiety and/or depression within last 3 years OR * [name]'s symptoms currently being treated with medication and/or therapy. Please contact the Clinical Social Worker if needs arise, or if [name] requests.  CSW educated [name] about PPD and provided [name] with flyer for [facility].  [name] is an established patient with  [facility].  [name] will contact agency if a need arises.  Blaine HamperAngel Boyd-Gilyard, MSW, LCSW Clinical Social Work [phone]

## 2016-06-19 NOTE — Lactation Note (Signed)
This note was copied from a baby's chart. Lactation Consultation Note  Patient Name: Meagan [name]: 06/19/2016 Reason for consult: Initial assessment;Breast/nipple pain   Initial assessment with mom of 22 hour old infant. Infant with 6 BF for 10-40 minutes, 1 void and 1 stool since birth. LATCH Score 9. Maternal history of Lexapro (L2) and Xanax (L3). MOm reports they had difficulty with her 25 yo being very sleepy at breast because of Xanax, mom reports she is on a much lower dose with this infant. Discussed with mom that some sleepiness is normal within the NB period and then importance of using awakening techniques to keep infant awake during feedings. Showed mom awakening techniques. Also Enc mom to compress/massage breast with feeding.   Mom reports nipple pain with feedings, infant is noted to have a high palate. Mom with soft compressible breasts and areola areas, colostrum easily expressible. Enc mom to use pillow and breast support with feeding. Assisted mom in latching infant to left breast in the cross cradle hold. Infant latched easily needing lips to be flanged. Mom reported pain with latch that eased off with feeding, she did note that as the infant went to sleep the pain increased, enc her to remove infant and relatch as needed. Nipple was noted to be slightly compressed after infant came off. Mom reports nipples are sore in between feeds. Enc mom to use EBM to nipples and gave her comfort gels with instructions for use and cleaning. Nipple tissue is intact.   Enc mom to feed infant STS 8-12 x in 24 hours at first feeding cues. Enc mom to use pillow and breast support with feeding. BF positioning reviewed with mom. LC Brochure and BF Resource Handout given, mom informed of IP/OP services, BF Support Groups and LC phone #. Enc mom to call out for feeding assistance as needed. Report to [place], Charity fundraiserN.    Maternal Data Formula Feeding for Exclusion: No Has patient been  taught Hand Expression?: Yes Does the patient have breastfeeding experience prior to this delivery?: Yes  Feeding Feeding Type: Breast Fed Length of feed: 10 min  LATCH Score/Interventions Latch: Grasps breast easily, tongue down, lips flanged, rhythmical sucking. Intervention(s): Adjust position;Assist with latch;Breast massage;Breast compression  Audible Swallowing: A few with stimulation Intervention(s): Alternate breast massage;Hand expression  Type of Nipple: Everted at rest and after stimulation  Comfort (Breast/Nipple): Filling, red/small blisters or bruises, mild/mod discomfort  Problem noted: Mild/Moderate discomfort Interventions (Mild/moderate discomfort): Hand expression;Comfort gels  Hold (Positioning): Assistance needed to correctly position infant at breast and maintain latch. Intervention(s): Breastfeeding basics reviewed;Support Pillows;Position options;Skin to skin  LATCH Score: 7  Lactation Tools Discussed/Used WIC Program: Yes [facility]   Consult Status Consult Status: Follow-up [name]: 06/20/16 Follow-up type: In-patient    Silas FloodSharon S Hisashi Amadon 06/19/2016, 1:04 PM

## 2016-06-19 NOTE — Progress Notes (Signed)
Post discharge chart review completed.  

## 2016-06-19 NOTE — Discharge Instructions (Signed)
Home Care Instructions for Mom °Introduction ° ACTIVITY °· Gradually return to your regular activities. °· Let yourself rest. Nap while your baby sleeps. °· Avoid lifting anything that is heavier than 10 lb (4.5 kg) until your health care provider says it is okay. °· Avoid activities that take a lot of effort and energy (are strenuous) until approved by your health care provider. Walking at a slow-to-moderate pace is usually safe. °· If you had a cesarean delivery: °¨ Do not vacuum, climb stairs, or drive a car for 4-6 weeks. °¨ Have someone help you at home until you feel like you can do your usual activities yourself. °¨ Do exercises as told by your health care provider, if this applies. °VAGINAL BLEEDING °You may continue to bleed for 4-6 weeks after delivery. Over time, the amount of blood usually decreases and the color of the blood usually gets lighter. However, the flow of bright red blood may increase if you have been too active. If you need to use more than one pad in an hour because your pad gets soaked, or if you pass a large clot: °· Lie down. °· Raise your feet. °· Place a cold compress on your lower abdomen. °· Rest. °· Call your health care provider. °If you are breastfeeding, your period should return anytime between 8 weeks after delivery and the time that you stop breastfeeding. If you are not breastfeeding, your period should return 6-8 weeks after delivery. °PERINEAL CARE °The perineal area, or perineum, is the part of your body between your thighs. After delivery, this area needs special care. Follow these instructions as told by your health care provider. °· Take warm tub baths for 15-20 minutes. °· Use medicated pads and pain-relieving sprays and creams as told. °· Do not use tampons or douches until vaginal bleeding has stopped. °· Each time you go to the bathroom: °¨ Use a peri bottle. °¨ Change your pad. °¨ Use towelettes in place of toilet paper until your stitches have healed. °· Do Kegel  exercises every day. Kegel exercises help to maintain the muscles that support the vagina, bladder, and bowels. You can do these exercises while you are standing, sitting, or lying down. To do Kegel exercises: °¨ Tighten the muscles of your abdomen and the muscles that surround your birth canal. °¨ Hold for a few seconds. °¨ Relax. °¨ Repeat until you have done this 5 times in a row. °· To prevent hemorrhoids from developing or getting worse: °¨ Drink enough fluid to keep your urine clear or pale yellow. °¨ Avoid straining when having a bowel movement. °¨ Take over-the-counter medicines and stool softeners as told by your health care provider. °BREAST CARE °· Wear a tight-fitting bra. °· Avoid taking over-the-counter pain medicine for breast discomfort. °· Apply ice to the breasts to help with discomfort as needed: °¨ Put ice in a plastic bag. °¨ Place a towel between your skin and the bag. °¨ Leave the ice on for 20 minutes or as told by your health care provider. °NUTRITION °· Eat a well-balanced diet. °· Do not try to lose weight quickly by cutting back on calories. °· Take your prenatal vitamins until your postpartum checkup or until your health care provider tells you to stop. °POSTPARTUM DEPRESSION °You may find yourself crying for no apparent reason and unable to cope with all of the changes that come with having a newborn. This mood is called postpartum depression. Postpartum depression happens because your hormone levels change after   delivery. If you have postpartum depression, get support from your partner, friends, and family. If the depression does not go away on its own after several weeks, contact your health care provider. °BREAST SELF-EXAM °Do a breast self-exam each month, at the same time of the month. If you are breastfeeding, check your breasts just after a feeding, when your breasts are less full. If you are breastfeeding and your period has started, check your breasts on day 5, 6, or 7 of your  period. °Report any lumps, bumps, or discharge to your health care provider. Know that breasts are normally lumpy if you are breastfeeding. This is temporary, and it is not a health risk. °INTIMACY AND SEXUALITY °Avoid sexual activity for at least 3-4 weeks after delivery or until the brownish-red vaginal flow is completely gone. If you want to avoid pregnancy, use some form of birth control. You can get pregnant after delivery, even if you have not had your period. °SEEK MEDICAL CARE IF: °· You feel unable to cope with the changes that a child brings to your life, and these feelings do not go away after several weeks. °· You notice a lump, a bump, or discharge on your breast. °SEEK IMMEDIATE MEDICAL CARE IF: °· Blood soaks your pad in 1 hour or less. °· You have: °¨ Severe pain or cramping in your lower abdomen. °¨ A bad-smelling vaginal discharge. °¨ A fever that is not controlled by medicine. °¨ A fever, and an area of your breast is red and sore. °¨ Pain or redness in your calf. °¨ Sudden, severe chest pain. °¨ Shortness of breath. °¨ Painful or bloody urination. °¨ Problems with your vision. °· You vomit for 12 hours or longer. °· You develop a severe headache. °· You have serious thoughts about hurting yourself, your child, or anyone else. °This information is not intended to replace advice given to you by your health care provider. Make sure you discuss any questions you have with your health care provider. °Document Released: 06/15/2000 Document Revised: 11/24/2015 Document Reviewed: 12/20/2014 °© 2017 Elsevier ° °

## 2016-06-19 NOTE — Telephone Encounter (Signed)
Informed patient prescription was sent to pharmacy. 

## 2016-06-19 NOTE — Progress Notes (Signed)
Post Partum Day 1 Subjective: no complaints, up ad lib, voiding, tolerating PO and + flatus. Breast feeding.  Objective: Blood pressure 111/68, pulse 69, temperature 98.6 F (37 C), temperature source Oral, resp. rate 16, height 5\' 2"  (1.575 m), weight 196 lb (88.9 kg), last menstrual period 07/19/2015, SpO2 99 %, unknown if currently breastfeeding.  Physical Exam:  General: alert, cooperative and no distress Lochia: appropriate Uterine Fundus: firm DVT Evaluation: No evidence of DVT seen on physical exam.   Recent Labs  06/18/16 1012 06/19/16 0550  HGB 12.1 11.2*  HCT 35.6* 32.7*    Assessment/Plan: Plan for discharge tomorrow   LOS: 1 day   Clearance Cootsndrew Tyson 06/19/2016, 9:17 AM   CNM attestation Post Partum Day #1 I have seen and examined this patient and agree with above documentation in the resident's note.   Princella PellegriniCatherine P Halseth is a 25 y.o. [phone] s/p SVD.  Pt denies problems with ambulating, voiding or po intake. Pain is well controlled.  Plan for birth control is Depo-Provera vs IUD.  Method of Feeding: breast  PE:  BP 111/68 (BP Location: Left Arm)   Pulse 69   Temp 98.6 F (37 C) (Oral)   Resp 16   Ht 5\' 2"  (1.575 m)   Wt 88.9 kg (196 lb)   LMP 07/19/2015   SpO2 99%   Breastfeeding? Unknown   BMI 35.85 kg/m  Fundus firm  Plan for discharge: 06/19/16 Baby is A pos- pt needs Geoffery LyonsRhogam  Annamae Shivley, CNM 9:28 AM  06/19/2016

## 2016-06-19 NOTE — Discharge Summary (Signed)
OB Discharge Summary  Patient Name: Meagan [name] DOB: 09/17/1990 MRN: [id number]  Date of admission: 06/18/2016 Delivering MD: Freddrick MarchAMIN, YASHIKA   Date of discharge: 06/19/2016  Admitting diagnosis: 39 wk induction Intrauterine pregnancy: 8068w0d     Secondary diagnosis:  Active Problems:   Polyhydramnios in third trimester  Additional problems: anxiety, hx of pre-e in prior pregnancy     Discharge diagnosis: Term Pregnancy Delivered                                                                                                Post partum procedures:None  Augmentation: Cytotec  Complications: None  Hospital course:  Induction of Labor With Vaginal Delivery   25 y.o. yo G3P3003 at 3068w0d was admitted to the hospital 06/18/2016 for induction of labor.  Indication for induction: Polyhydramnios.  Patient had an uncomplicated labor course as follows: Membrane Rupture Time/Date: 1:30 PM ,06/18/2016   Intrapartum Procedures: Episiotomy: None [1]                                         Lacerations:  None [1]  Patient had delivery of a Viable infant.  Information for the patient's newborn:  Heide ScalesSchwartz, Girl Tashanti [[id number]][[id number]]  Delivery Method: Vag-Spont   06/18/2016  Details of delivery can be found in separate delivery note.  Patient had a routine postpartum course. Patient is discharged home 06/19/16.  Physical exam Vitals:   06/18/16 1614 06/18/16 1721 06/18/16 2048 06/19/16 0515  BP: 116/66 129/71 118/65 111/68  Pulse: 77 97 83 69  Resp: 18 18 18 16   Temp: 98.4 F (36.9 C) 98.4 F (36.9 C) 97.7 F (36.5 C) 98.6 F (37 C)  TempSrc: Oral Oral Oral Oral  SpO2: 99% 99%    Weight:      Height:       General: alert, cooperative and no distress Lochia: appropriate Uterine Fundus: firm Incision: N/A DVT Evaluation: No evidence of DVT seen on physical exam.  Labs: Lab Results  Component Value Date   WBC 10.7 (H) 06/19/2016   HGB 11.2 (L) 06/19/2016   HCT 32.7 (L)  06/19/2016   MCV 92.1 06/19/2016   PLT 133 (L) 06/19/2016   CMP Latest Ref Rng & Units 06/10/2016  Glucose 65 - 99 mg/dL 82  BUN 6 - 20 mg/dL 11  Creatinine 7.250.44 - 3.661.00 mg/dL 4.400.54  Sodium 347135 - 425145 mmol/L 134(L)  Potassium 3.5 - 5.1 mmol/L 3.8  Chloride 101 - 111 mmol/L 107  CO2 22 - 32 mmol/L 18(L)  Calcium 8.9 - 10.3 mg/dL 8.2(L)  Total Protein 6.5 - 8.1 g/dL 5.3(L)  Total Bilirubin 0.3 - 1.2 mg/dL 0.6  Alkaline Phos 38 - 126 U/L 127(H)  AST 15 - 41 U/L 11(L)  ALT 14 - 54 U/L 12(L)   Discharge instruction: per After Visit Summary and "Baby and Me Booklet".  After visit meds:  Allergies as of 06/19/2016      Reactions   Codeine Nausea And Vomiting, Other (  See Comments)   Vertigo   Penicillins Rash, Other (See Comments)   Unknown:  Childhood reaction.  Has patient had a PCN reaction causing immediate rash, facial/tongue/throat swelling, SOB or lightheadedness with hypotension: Unknown Has patient had a PCN reaction causing severe rash involving mucus membranes or skin necrosis: Unknown Has patient had a PCN reaction that required hospitalization No Has patient had a PCN reaction occurring within the last 10 years: No If all of the above answers are "NO", then may pro      Medication List    TAKE these medications   acetaminophen 500 MG tablet Commonly known as:  TYLENOL Take 1,000 mg by mouth every 6 (six) hours as needed for mild pain.   albuterol 108 (90 Base) MCG/ACT inhaler Commonly known as:  PROVENTIL HFA;VENTOLIN HFA Inhale into the lungs every 6 (six) hours as needed for wheezing or shortness of breath.   ALPRAZolam 0.25 MG tablet Commonly known as:  XANAX Take 1 tablet (0.25 mg total) by mouth 2 (two) times daily as needed for anxiety.   butalbital-acetaminophen-caffeine 50-325-40 MG tablet Commonly known as:  FIORICET, ESGIC Take by mouth 2 (two) times daily as needed for headache.   ibuprofen 600 MG tablet Commonly known as:  ADVIL,MOTRIN Take 1  tablet (600 mg total) by mouth every 6 (six) hours.   nortriptyline 10 MG capsule Commonly known as:  PAMELOR Take 4 capsules (40 mg total) by mouth at bedtime.   omeprazole 20 MG capsule Commonly known as:  PRILOSEC Take 20 mg by mouth daily.   prenatal vitamin w/FE, FA 27-1 MG Tabs tablet Take 1 tablet by mouth daily at 12 noon.   senna-docusate 8.6-50 MG tablet Commonly known as:  Senokot-S Take 2 tablets by mouth daily. Start taking on:  06/20/2016      Diet: routine diet  Activity: Advance as tolerated. Pelvic rest for 6 weeks.   Outpatient follow up:6 weeks Follow up Appt:No future appointments. Follow up Visit:No Follow-up on file.  Postpartum contraception: Depo vs IUD  Newborn Data: Live born female  Birth Weight: 8 lb 3 oz (3715 g) APGAR: 9, 9  Baby Feeding: Breast Disposition:home with mother   06/19/2016 Freddrick MarchYashika Amin, MD   CNM attestation I have seen and examined this patient and agree with above documentation in the resident's note.   Meagan [name] is a 25 y.o. [id number] s/p SVD.   Pain is well controlled.  Plan for birth control is Depo-Provera vs IUD.  Method of Feeding: breast  PE:  BP 107/72   Pulse 74   Temp 98.3 F (36.8 C) (Oral)   Resp 18   Ht 5\' 2"  (1.575 m)   Wt 88.9 kg (196 lb)   SpO2 99%   Breastfeeding? Unknown   BMI 35.85 kg/m  Fundus firm  No results for input(s): HGB, HCT in the last 72 hours.   Plan: discharge today - postpartum care discussed - f/u clinic in 6 weeks for postpartum visit   Jalil Lorusso, CNM 12:44 AM

## 2016-06-19 NOTE — Progress Notes (Signed)
CSW acknowledged consult and attempted to meet with [name].  However, [name] was being attended to by bedside nurse.  CSW will attempt to meet with [name] at a later time.   Blaine HamperAngel Boyd-Gilyard, MSW, LCSW Clinical Social Work [phone]

## 2016-06-20 LAB — RH IG WORKUP (INCLUDES ABO/RH)
ABO/RH(D): A NEG
Fetal Screen: NEGATIVE
Gestational Age(Wks): 39
Unit division: 0

## 2016-06-23 ENCOUNTER — Inpatient Hospital Stay (HOSPITAL_COMMUNITY)
Admission: AD | Admit: 2016-06-23 | Discharge: 2016-06-23 | Disposition: A | Discharge status: Home / Self Care | Payer: Medicaid Other | Source: Ambulatory Visit | Attending: Obstetrics & Gynecology | Admitting: Obstetrics & Gynecology

## 2016-06-23 ENCOUNTER — Encounter (HOSPITAL_COMMUNITY): Payer: Self-pay | Admitting: *Deleted

## 2016-06-23 DIAGNOSIS — N61 Mastitis without abscess: Secondary | ICD-10-CM | POA: Insufficient documentation

## 2016-06-23 DIAGNOSIS — R51 Headache: Secondary | ICD-10-CM | POA: Diagnosis present

## 2016-06-23 DIAGNOSIS — F419 Anxiety disorder, unspecified: Secondary | ICD-10-CM | POA: Diagnosis not present

## 2016-06-23 DIAGNOSIS — G43909 Migraine, unspecified, not intractable, without status migrainosus: Secondary | ICD-10-CM | POA: Insufficient documentation

## 2016-06-23 MED ORDER — CEPHALEXIN 500 MG PO CAPS: 500.0000 mg | ORAL_CAPSULE | Freq: Four times a day (QID) | ORAL | 2 refills | Status: DC

## 2016-06-23 NOTE — Discharge Instructions (Signed)
Breastfeeding and Mastitis Mastitis is inflammation of the breast tissue. It can occur in women who are breastfeeding. This can make breastfeeding painful. Mastitis will sometimes go away on its own. Your health care provider will help determine if treatment is needed. CAUSES Mastitis is often associated with a blocked milk (lactiferous) duct. This can happen when too much milk builds up in the breast. Causes of excess milk in the breast can include:  Poor latch-on. If your baby is not latched onto the breast properly, she or he may not empty your breast completely while breastfeeding.  Allowing too much time to pass between feedings.  Wearing a bra or other clothing that is too tight. This puts extra pressure on the lactiferous ducts so milk does not flow through them as it should. Mastitis can also be caused by a bacterial infection. Bacteria may enter the breast tissue through cuts or openings in the skin. In women who are breastfeeding, this may occur because of cracked or irritated skin. Cracks in the skin are often caused when your baby does not latch on properly to the breast. SIGNS AND SYMPTOMS  Swelling, redness, tenderness, and pain in an area of the breast.  Swelling of the glands under the arm on the same side.  Fever may or may not accompany mastitis. If an infection is allowed to progress, a collection of pus (abscess) may develop. DIAGNOSIS  Your health care provider can usually diagnose mastitis based on your symptoms and a physical exam. Tests may be done to help confirm the diagnosis. These may include:  Removal of pus from the breast by applying pressure to the area. This pus can be examined in the lab to determine which bacteria are present. If an abscess has developed, the fluid in the abscess can be removed with a needle. This can also be used to confirm the diagnosis and determine the bacteria present. In most cases, pus will not be present.  Blood tests to determine  if your body is fighting a bacterial infection.  Mammogram or ultrasound tests to rule out other problems or diseases. TREATMENT  Mastitis that occurs with breastfeeding will sometimes go away on its own. Your health care provider may choose to wait 24 hours after first seeing you to decide whether a prescription medicine is needed. If your symptoms are worse after 24 hours, your health care provider will likely prescribe an antibiotic medicine to treat the mastitis. He or she will determine which bacteria are most likely causing the infection and will then select an appropriate antibiotic medicine. This is sometimes changed based on the results of tests performed to identify the bacteria, or if there is no response to the antibiotic medicine selected. Antibiotic medicines are usually given by mouth. You may also be given medicine for pain. HOME CARE INSTRUCTIONS  Only take over-the-counter or prescription medicines for pain, fever, or discomfort as directed by your health care provider.  If your health care provider prescribed an antibiotic medicine, take the medicine as directed. Make sure you finish it even if you start to feel better.  Do not wear a tight or underwire bra. Wear a soft, supportive bra.  Increase your fluid intake, especially if you have a fever.  Continue to empty the breast. Your health care provider can tell you whether this milk is safe for your infant or needs to be thrown out. You may be told to stop nursing until your health care provider thinks it is safe for your baby.  Use a breast pump if you are advised to stop nursing.  Keep your nipples clean and dry.  Empty the first breast completely before going to the other breast. If your baby is not emptying your breasts completely for some reason, use a breast pump to empty your breasts.  If you go back to work, pump your breasts while at work to stay in time with your nursing schedule.  Avoid allowing your breasts to  become overly filled with milk (engorged). SEEK MEDICAL CARE IF:  You have pus-like discharge from the breast.  Your symptoms do not improve with the treatment prescribed by your health care provider within 2 days. SEEK IMMEDIATE MEDICAL CARE IF:  Your pain and swelling are getting worse.  You have pain that is not controlled with medicine.  You have a red line extending from the breast toward your armpit.  You have a fever or persistent symptoms for more than 2-3 days.  You have a fever and your symptoms suddenly get worse. MAKE SURE YOU:   Understand these instructions.  Will watch your condition.  Will get help right away if you are not doing well or get worse. This information is not intended to replace advice given to you by your health care provider. Make sure you discuss any questions you have with your health care provider. Document Released: 10/13/2004 Document Revised: 06/23/2013 Document Reviewed: 01/22/2013 Elsevier Interactive Patient Education  2017 [employer]

## 2016-06-23 NOTE — MAU Note (Signed)
Pt states she has had a severe headache all day she took tylenol and ibuprofen and nothing helped.  States her legs are swollen and she didn't have any of that during the pregnancy as well as her fingers.

## 2016-06-23 NOTE — MAU Provider Note (Signed)
Chief Complaint:  Headache and Leg Swelling   First Provider Initiated Contact with Patient 06/23/16 2028       HPI: Meagan [name] is a 25 y.o. [id number] Postpartum Day # 5 who presents to maternity admissions reporting severe headache unrelieved by tylenol and ibuprofen. Has some swelling in her ankles.   States has never had ankle swelling before.   Has a history of migraines but did not take Fioricet. . She reports no vaginal bleeding, vaginal itching/burning, urinary symptoms, h/a, dizziness, n/v, or fever/chills.  States "I'm afraid of dying after childbirth, I watch too many videos"  Headache   This is a recurrent problem. The current episode started today. The problem occurs constantly. The problem has been unchanged. The pain is located in the frontal region. The pain does not radiate. The pain quality is similar to prior headaches (yes and no. usually has hemiplegic h/a). The quality of the pain is described as aching and dull. The pain is moderate. Associated symptoms include photophobia. Pertinent negatives include no abdominal pain, back pain, blurred vision, dizziness, fever, loss of balance, muscle aches, nausea, numbness, seizures, sinus pressure, tingling, visual change, vomiting or weakness. The symptoms are aggravated by bright light. She has tried acetaminophen and NSAIDs for the symptoms. The treatment provided no relief.    RN Note: Pt states she has had a severe headache all day she took tylenol and ibuprofen and nothing helped.  States her legs are swollen and she didn't have any of that during the pregnancy as well as her fingers.      Electronically signed by Elisabeth PigeonAlexandra J Gagliardo    Past Medical History: Past Medical History:  Diagnosis Date  . Abdominal cramps 12/06/2015  . Anxiety   . Asthma    inhaler at bedside  . Daily headache   . Diarrhea 06/04/2013  . Hemiplegic migraine    blurred vision, nausea, dizziness, numbness  . Kidney stones   . Mental  disorder    huffed inhalants "affected brain"  . Nausea 06/04/2013  . Nausea and vomiting during pregnancy 12/06/2015  . Pregnant 06/04/2013  . Pregnant 10/17/2015  . Pyelonephritis complicating pregnancy, antepartum, first trimester 11/05/2011  . Sore throat 06/04/2013  . Stomach ulcer     Past obstetric history: OB History  Gravida Para Term Preterm AB Living  3 3 3  0 0 3  SAB TAB Ectopic Multiple Live Births  0 0 0 0 3    # Outcome Date GA Lbr Len/2nd Weight Sex Delivery Anes PTL Lv  3 Term 06/18/16 6741w0d 04:13 / 00:04 8 lb 3 oz (3.715 kg) F Vag-Spont EPI  LIV  2 Term 02/13/14 4874w4d 03:14 / 00:05 6 lb 12.1 oz (3.065 kg) F Vag-Spont EPI N LIV  1 Term 05/30/12 6033w1d 15:31 / 04:14 7 lb 14 oz (3.572 kg) M Vag-Spont EPI N LIV      Past Surgical History: Past Surgical History:  Procedure Laterality Date  . no past surgery      Family History: Family History  Problem Relation Age of Onset  . Cancer Maternal Grandmother     breast  . Cancer Paternal Grandfather     lung cancer  . Migraines Father   . Hypertension Father   . Hypercholesterolemia Father   . Other Son     ventricular defect  . Hypertension Other     Social History: Social History  Substance Use Topics  . Smoking status: Current Every Day Smoker  Packs/day: 1.00    Years: 7.00    Types: Cigarettes  . Smokeless tobacco: Never Used     Comment: current smoker 06/17/2016 2300  . Alcohol use No    Allergies:  Allergies  Allergen Reactions  . Codeine Nausea And Vomiting and Other (See Comments)    Vertigo  . Penicillins Rash and Other (See Comments)    Unknown:  Childhood reaction.  Has patient had a PCN reaction causing immediate rash, facial/tongue/throat swelling, SOB or lightheadedness with hypotension: Unknown Has patient had a PCN reaction causing severe rash involving mucus membranes or skin necrosis: Unknown Has patient had a PCN reaction that required hospitalization No Has patient had a PCN  reaction occurring within the last 10 years: No If all of the above answers are "NO", then may pro    Meds:  Prescriptions Prior to Admission  Medication Sig Dispense Refill Last Dose  . acetaminophen (TYLENOL) 500 MG tablet Take 1,000 mg by mouth every 6 (six) hours as needed for mild pain.    Past Week at Unknown time  . albuterol (PROVENTIL HFA;VENTOLIN HFA) 108 (90 Base) MCG/ACT inhaler Inhale into the lungs every 6 (six) hours as needed for wheezing or shortness of breath.   Unknown at Unknown time  . ALPRAZolam (XANAX) 0.25 MG tablet TAKE ONE TABLET BY MOUTH TWICE DAILY AS NEEDED FOR ANXIETY. 60 tablet 1   . butalbital-acetaminophen-caffeine (FIORICET, ESGIC) 50-325-40 MG tablet Take by mouth 2 (two) times daily as needed for headache.   Unknown at Unknown time  . ibuprofen (ADVIL,MOTRIN) 600 MG tablet Take 1 tablet (600 mg total) by mouth every 6 (six) hours. 60 tablet 0   . nortriptyline (PAMELOR) 10 MG capsule Take 4 capsules (40 mg total) by mouth at bedtime. 120 capsule 12 06/17/2016 at Unknown time  . omeprazole (PRILOSEC) 20 MG capsule Take 20 mg by mouth daily.   Unknown at Unknown time  . prenatal vitamin w/FE, FA (PRENATAL 1 + 1) 27-1 MG TABS tablet Take 1 tablet by mouth daily at 12 noon. 30 each 12 06/17/2016 at Unknown time  . senna-docusate (SENOKOT-S) 8.6-50 MG tablet Take 2 tablets by mouth daily. 60 tablet 0     I have reviewed patient's Past Medical Hx, Surgical Hx, Family Hx, Social Hx, medications and allergies.  ROS:  Review of Systems  Constitutional: Negative for fever.  HENT: Negative for sinus pressure.   Eyes: Positive for photophobia. Negative for blurred vision.  Gastrointestinal: Negative for abdominal pain, nausea and vomiting.  Musculoskeletal: Negative for back pain.  Neurological: Positive for headaches. Negative for dizziness, tingling, seizures, weakness, numbness and loss of balance.   Other systems negative     Physical Exam  Patient Vitals  for the past 24 hrs:  BP Temp Temp src Pulse Resp Height Weight  06/23/16 2004 130/81 98 F (36.7 C) Oral 95 16 - -  06/23/16 2001 - - - - - 5\' 2"  (1.575 m) 183 lb 12.8 oz (83.4 kg)   BP 122/76       126/74       113/66  Constitutional: Well-developed, well-nourished female in no acute distress.  Cardiovascular: normal rate and rhythm, no ectopy audible, S1 & S2 heard, no murmur Respiratory: normal effort, no distress. Lungs CTAB with no wheezes or crackles Breasts:  Bilateral redness on undersides of both breasts, nonindurated, slighly tender.                 No fever GI: Abd  soft, non-tender.  Nondistended.  No rebound, No guarding.  Bowel Sounds audible  MS: Extremities nontender, Very small amount of edema on medial ankle/malleolus, normal ROM Neurologic: Alert and oriented x 4.   Grossly nonfocal.   DTRs 1+ with no clonus GU: Neg CVAT. Skin:  Warm and Dry Psych:  Affect appropriate.  PELVIC EXAM: Deferred   Labs: No results found for this or any previous visit (from the past 24 hour(s)). --/--/A NEG (12/19 0549) Labwork not indicated  Imaging:   MAU Course/MDM: I have ordered labs as follows:none indicated as patient is not hypertensive Imaging ordered: none Results reviewed.  Offered medication for headache, refused  Pt stable at time of discharge.  Assessment: Postpartum x 5 days  Anxiety  Migraine without status migrainosus, not intractable, unspecified migraine type  Mastitis   Plan: Discharge home Recommend Take fioricet when she gets home Rx sent for Keflex for mastitis Followup in clinic for breast check Come back in if develops worsening or fever   Encouraged to return here or to other Urgent Care/ED if she develops worsening of symptoms, increase in pain, fever, or other concerning symptoms.   Wynelle Bourgeois CNM, MSN Certified Nurse-Midwife 06/23/2016 8:29 PM   Attestation of Attending Supervision of Advanced Practice Provider  (PA/CNM/NP): Evaluation and management procedures were performed by the Advanced Practice Provider under my supervision and collaboration.  I have reviewed the Advanced Practice Provider's note and chart, and I agree with the management and plan.  Jaynie Collins, MD, FACOG Attending Obstetrician & Gynecologist Faculty Practice, [facility]

## 2016-07-05 ENCOUNTER — Encounter: Payer: Self-pay | Admitting: Obstetrics & Gynecology

## 2016-08-06 ENCOUNTER — Ambulatory Visit: Payer: Medicaid Other | Admitting: Women's Health

## 2016-08-13 ENCOUNTER — Ambulatory Visit (INDEPENDENT_AMBULATORY_CARE_PROVIDER_SITE_OTHER): Payer: Self-pay | Admitting: Women's Health

## 2016-08-13 ENCOUNTER — Encounter: Payer: Self-pay | Admitting: Women's Health

## 2016-08-13 DIAGNOSIS — F419 Anxiety disorder, unspecified: Secondary | ICD-10-CM

## 2016-08-13 DIAGNOSIS — K5900 Constipation, unspecified: Secondary | ICD-10-CM

## 2016-08-13 DIAGNOSIS — Z3202 Encounter for pregnancy test, result negative: Secondary | ICD-10-CM

## 2016-08-13 LAB — POCT URINE PREGNANCY: Preg Test, Ur: NEGATIVE

## 2016-08-13 MED ORDER — NORETHINDRONE 0.35 MG PO TABS: ORAL_TABLET | ORAL | 11 refills | Status: DC

## 2016-08-13 NOTE — Progress Notes (Signed)
Subjective:    Meagan [name] is a 26 y.o. [id number] Caucasian female who presents for a postpartum visit. She is 8 weeks postpartum following a spontaneous vaginal delivery at 39.0 gestational weeks after IOL for polyhydramnios. Anesthesia: epidural. I have fully reviewed the prenatal and intrapartum course. Postpartum course has been uncomplicated. Baby's course has been uncomplicated. Baby is feeding by br/bottle, milk supply decreased. Bleeding no bleeding. Bowel function is some constipation. Bladder function is normal. Patient is sexually active. Last sexual activity: 08/10/16. Contraception method is coitus interruptus and wants pills. Understands she has to take at exact same time daily to be effective. Also discussed other options- depo, nexplanon, IUD- does not want any of these.  Postpartum depression screening: positive. Score 12. On xanax, and pamelor. Reports some depression, more anxiety, but overall feels like she's doing well. Denies SI/HI/II. Not in counseling-declined during pregnancy, unable to make it to appointments.  Last pap 11/15/15 and was neg. Reports headaches, has rx for fioricet she had during pregnancy, rarely has to take. Some feelings of vertigo, doesn't feel dizzy/lightheaded, feels like everything's spinning and she is off balance.   The following portions of the patient's history were reviewed and updated as appropriate: allergies, current medications, past medical history, past surgical history and problem list.  Review of Systems Pertinent items are noted in HPI.   Vitals:   08/13/16 1552  BP: 112/64  Pulse: 68  Weight: 174 lb (78.9 kg)  Height: 5\' 2"  (1.575 m)   No LMP recorded.  Objective:   General:  alert, cooperative and no distress   Breasts:  deferred, no complaints  Lungs: clear to auscultation bilaterally  Heart:  regular rate and rhythm  Abdomen: soft, nontender   Vulva: normal  Vagina: normal vagina  Cervix:  closed  Corpus:  Well-involuted  Adnexa:  Non-palpable  Rectal Exam: No hemorrhoids        Assessment:   Postpartum exam 8 wks s/p SVB after IOL for polyhydramnios Breastfeeding, decreased milk supply Headaches Constipation Vertigo Anxiety/depression Depression screening Contraception counseling   Plan:  Contraception: rx micronor w/ 11RF, to set alarm to remind to take at exact same time daily, states he still plans coitus interruptus as well  Continue current meds for depression/anxiety To discuss vertigo/headaches w/ PCP Gave printed info on ways to increase breastmilk supply Gave printed info on constipation prevention/relief Follow up in: May for physical or earlier if needed  Marge DuncansBooker, Callahan Peddie Randall CNM, [facility]NP-BC 08/13/2016 4:03 PM

## 2016-08-13 NOTE — Patient Instructions (Addendum)
Tips To Increase Milk Supply  Lots of water! Enough so that your urine is clear  Plenty of calories, if you're not getting enough calories, your milk supply can decrease  Breastfeed/pump often, every 2-3 hours x 20-3030mins  Fenugreek 3 pills 3 times a day, this may make your urine smell like maple syrup  Mother's Milk Tea  Lactation cookies, google for the recipe  Real oatmeal   For Headaches:   Stay well hydrated, drink enough water so that your urine is clear, sometimes if you are dehydrated you can get headaches  Eat small frequent meals and snacks, sometimes if you are hungry you can get headaches  Sometimes you get headaches during pregnancy from the pregnancy hormones  You can try tylenol (1-2 regular strength 325mg  or 1-2 extra strength 500mg ) as directed on the box. The least amount of medication that works is best.   Cool compresses (cool wet washcloth or ice pack) to area of head that is hurting  You can also try drinking a caffeinated drink to see if this will help  If not helping, try below:  For Prevention of Headaches/Migraines:  CoQ10 100mg  three times daily  Vitamin B2 400mg  daily  Magnesium Oxide 400-600mg  daily  If You Get a Bad Headache/Migraine:  Benadryl 25mg    Magnesium Oxide  1 large Gatorade  2 extra strength Tylenol (1,000mg  total)  1 cup coffee or Coke  If this doesn't help please call us @ [phone]  For Dizzy Spells:   This is usually related to either your blood sugar or your blood pressure dropping  Make sure you are staying well hydrated and drinking enough water so that your urine is clear  Eat small frequent meals and snacks containing protein (meat, eggs, nuts, cheese) so that your blood sugar doesn't drop  If you do get dizzy, sit/lay down and get you something to drink and a snack containing protein- you will usually start feeling better in 10-20 minutes   Constipation  Drink plenty of fluid, preferably water,  throughout the day  Eat foods high in fiber such as fruits, vegetables, and grains  Exercise, such as walking, is a good way to keep your bowels regular  Drink warm fluids, especially warm prune juice, or decaf coffee  Eat a 1/2 cup of real oatmeal (not instant), 1/2 cup applesauce, and 1/2-1 cup warm prune juice every day  If needed, you may take Colace (docusate sodium) stool softener once or twice a day to help keep the stool soft. If you are pregnant, wait until you are out of your first trimester (12-14 weeks of pregnancy)  If you still are having problems with constipation, you may take Miralax once daily as needed to help keep your bowels regular.  If you are pregnant, wait until you are out of your first trimester (12-14 weeks of pregnancy)

## 2016-08-14 ENCOUNTER — Telehealth (HOSPITAL_COMMUNITY): Payer: Self-pay

## 2016-08-14 NOTE — Telephone Encounter (Signed)
Mom calls with questions about renting DEBP.  Mom has 307 week old baby and has been pumping exclusively X5 weeks and decrease supply in past 3 weeks.  Mom has Medela pump in style advance. Mom has been taking a pumping break for 9 hours at night with frequent pumping during the day getting a bout 15mls.  LC discussed ways to try to increase supply with use of DEBP.  Lc offered to schedule o/p appointment and mom will attempt tips prior to scheduling appointment.  Mom will also try part of a bottle feeding and then attempt latching baby.  Mom aware to monitor weight gain and about support groups services.  Mom is receiving WIC services and getting soy formula for baby.

## 2016-08-20 ENCOUNTER — Telehealth: Payer: Self-pay | Admitting: *Deleted

## 2016-08-20 NOTE — Telephone Encounter (Signed)
Patient called stating she has not had any bleeding since last week but yesterday and today has had some light spotting. She is not cramping or having any clots and states she is only pumping 1 time per day. I informed patient that she could be having a light period since she is pumping but the light spotting did not sound worrisome. I encouraged her to call back if she started having heavy bleeding, cramping or a fever. Pt verbalized understanding.

## 2016-08-27 ENCOUNTER — Telehealth: Payer: Self-pay | Admitting: Women's Health

## 2016-08-28 ENCOUNTER — Telehealth: Payer: Self-pay | Admitting: Obstetrics & Gynecology

## 2016-08-28 NOTE — Telephone Encounter (Signed)
Patient called stating she thinks she has started her period since having the baby which is heavy (1 pad every 2 hours), no clots and she is not breastfeeding anymore. Informed patient that bleeding may heavier at her first period. If bleeding continues to be heavy at the end of the week to give me a call back. Pt verbalized understanding.

## 2016-08-28 NOTE — Telephone Encounter (Signed)
Pt called stating that she was returning Leah's phone call from yesterday. Pt states it is something about her bleeding. Please contact pt

## 2016-08-30 ENCOUNTER — Telehealth: Payer: Self-pay | Admitting: *Deleted

## 2016-08-30 NOTE — Telephone Encounter (Signed)
Pt called stating she was 2 months postpartum and had began bleeding for 2 weeks, at times heavily. She then stated the bleeding had stopped for a day but then she began bleeding again this morning. I advised patient to schedule an appointment to be seen by a provider.

## 2016-08-31 ENCOUNTER — Ambulatory Visit: Payer: Self-pay | Admitting: Women's Health

## 2016-09-05 ENCOUNTER — Ambulatory Visit: Payer: Self-pay | Admitting: Women's Health

## 2016-09-16 ENCOUNTER — Inpatient Hospital Stay (HOSPITAL_COMMUNITY)
Admission: AD | Admit: 2016-09-16 | Discharge: 2016-09-17 | Disposition: A | Discharge status: Home / Self Care | Payer: Medicaid Other | Source: Ambulatory Visit | Attending: Obstetrics and Gynecology | Admitting: Obstetrics and Gynecology

## 2016-09-16 ENCOUNTER — Encounter (HOSPITAL_COMMUNITY): Payer: Self-pay | Admitting: *Deleted

## 2016-09-16 DIAGNOSIS — Z880 Allergy status to penicillin: Secondary | ICD-10-CM | POA: Insufficient documentation

## 2016-09-16 DIAGNOSIS — N939 Abnormal uterine and vaginal bleeding, unspecified: Secondary | ICD-10-CM | POA: Insufficient documentation

## 2016-09-16 DIAGNOSIS — N920 Excessive and frequent menstruation with regular cycle: Secondary | ICD-10-CM

## 2016-09-16 DIAGNOSIS — F17210 Nicotine dependence, cigarettes, uncomplicated: Secondary | ICD-10-CM | POA: Insufficient documentation

## 2016-09-16 NOTE — MAU Note (Addendum)
Pt presents complaining of heavy vaginal bleeding that started yesterday. This is her period but it is heavier than normal. LMP 09/14/16. Denies pain. States she is going through one pad an hour

## 2016-09-16 NOTE — MAU Provider Note (Signed)
History   [id number]   Chief Complaint  Patient presents with  . Vaginal Bleeding    HPI Meagan [name] is a 26 y.o. female  [phone] here with report of increased vaginal bleeding with menses.  Reports starting her cycle two days ago.  Both cycles since having her last baby 3 months ago were heavier than before.  Report of small clots.  Pelvic cramping.  Denies dizziness or headaches that are increased from typical chronic condition.  No form of birth control at this time.  In addition, patient report of feeling increased mood swings and depression that is increasing in past week.  No report of suicidal or homicidal ideation.  Plans to begin therapy soon.   Patient's last menstrual period was 09/14/2016.  OB History  Gravida Para Term Preterm AB Living  3 3 3  0 0 3  SAB TAB Ectopic Multiple Live Births  0 0 0 0 3    # Outcome Date GA Lbr Len/2nd Weight Sex Delivery Anes PTL Lv  3 Term 06/18/16 [redacted]w[redacted]d 04:13 / 00:04 8 lb 3 oz (3.715 kg) F Vag-Spont EPI  LIV  2 Term 02/13/14 [redacted]w[redacted]d 03:14 / 00:05 6 lb 12.1 oz (3.065 kg) F Vag-Spont EPI N LIV  1 Term 05/30/12 [redacted]w[redacted]d 15:31 / 04:14 7 lb 14 oz (3.572 kg) M Vag-Spont EPI N LIV      Past Medical History:  Diagnosis Date  . Abdominal cramps 12/06/2015  . Anxiety   . Asthma    inhaler at bedside  . Daily headache   . Diarrhea 06/04/2013  . Hemiplegic migraine    blurred vision, nausea, dizziness, numbness  . Kidney stones   . Mental disorder    huffed inhalants "affected brain"  . Nausea 06/04/2013  . Nausea and vomiting during pregnancy 12/06/2015  . Pregnant 06/04/2013  . Pregnant 10/17/2015  . Pyelonephritis complicating pregnancy, antepartum, first trimester 11/05/2011  . Sore throat 06/04/2013  . Stomach ulcer     Family History  Problem Relation Age of Onset  . Cancer Maternal Grandmother     breast  . Cancer Paternal Grandfather     lung cancer  . Migraines Father   . Hypertension Father   . Hypercholesterolemia Father    . Other Son     ventricular defect  . Hypertension Other     Social History   Social History  . Marital status: Married    Spouse name: N/A  . Number of children: N/A  . Years of education: N/A   Social History Main Topics  . Smoking status: Current Every Day Smoker    Packs/day: 1.00    Years: 7.00    Types: Cigarettes  . Smokeless tobacco: Never Used     Comment: current smoker 06/17/2016 2300  . Alcohol use 0.0 oz/week     Comment: occ  . Drug use: No     Comment: huffed inhalants in past, "affected Brain"  . Sexual activity: Yes    Birth control/ protection: None, Rhythm   Other Topics Concern  . None   Social History Narrative   Lives at home with husband and kids.   Right handed.   Six sodas per day.    Allergies  Allergen Reactions  . Codeine Nausea And Vomiting and Other (See Comments)    Vertigo  . Penicillins Rash and Other (See Comments)    Unknown:  Childhood reaction.  Has patient had a PCN reaction causing immediate rash, facial/tongue/throat swelling, SOB or  lightheadedness with hypotension: Unknown Has patient had a PCN reaction causing severe rash involving mucus membranes or skin necrosis: Unknown Has patient had a PCN reaction that required hospitalization No Has patient had a PCN reaction occurring within the last 10 years: No If all of the above answers are "NO", then may pro    No current facility-administered medications on file prior to encounter.    Current Outpatient Prescriptions on File Prior to Encounter  Medication Sig Dispense Refill  . acetaminophen (TYLENOL) 500 MG tablet Take 1,000 mg by mouth every 6 (six) hours as needed for mild pain.     Marland Kitchen ALPRAZolam (XANAX) 0.25 MG tablet TAKE ONE TABLET BY MOUTH TWICE DAILY AS NEEDED FOR ANXIETY. 60 tablet 1  . ibuprofen (ADVIL,MOTRIN) 600 MG tablet Take 1 tablet (600 mg total) by mouth every 6 (six) hours. 60 tablet 0  . nortriptyline (PAMELOR) 10 MG capsule Take 4 capsules (40 mg  total) by mouth at bedtime. 120 capsule 12  . prenatal vitamin w/FE, FA (PRENATAL 1 + 1) 27-1 MG TABS tablet Take 1 tablet by mouth daily at 12 noon. 30 each 12  . albuterol (PROVENTIL HFA;VENTOLIN HFA) 108 (90 Base) MCG/ACT inhaler Inhale into the lungs every 6 (six) hours as needed for wheezing or shortness of breath.    . butalbital-acetaminophen-caffeine (FIORICET, ESGIC) 50-325-40 MG tablet Take by mouth 2 (two) times daily as needed for headache.    . cephALEXin (KEFLEX) 500 MG capsule Take 1 capsule (500 mg total) by mouth 4 (four) times daily. (Patient not taking: Reported on 08/13/2016) 28 capsule 2  . norethindrone (MICRONOR,CAMILA,ERRIN) 0.35 MG tablet 1 tablet by mouth at same time daily 1 Package 11  . omeprazole (PRILOSEC) 20 MG capsule Take 20 mg by mouth daily.    Marland Kitchen senna-docusate (SENOKOT-S) 8.6-50 MG tablet Take 2 tablets by mouth daily. (Patient not taking: Reported on 08/13/2016) 60 tablet 0     Review of Systems  Constitutional: Negative for fatigue and fever.  Genitourinary: Positive for pelvic pain and vaginal bleeding. Negative for dysuria, flank pain and frequency.  Neurological: Positive for dizziness. Negative for headaches.  All other systems reviewed and are negative.    Physical Exam   Vitals:   09/16/16 2327  BP: 123/70  Pulse: 100  Resp: 18  Temp: 98 F (36.7 C)  TempSrc: Oral    Physical Exam  Constitutional: She is oriented to person, place, and time. She appears well-developed and well-nourished.  HENT:  Head: Normocephalic.  Neck: Normal range of motion. Neck supple.  Cardiovascular: Normal rate, regular rhythm and normal heart sounds.   Respiratory: Effort normal and breath sounds normal.  GI: Soft. She exhibits no mass. There is tenderness. There is no guarding.  Genitourinary: There is bleeding (negative clots; moderate bleeding) in the vagina.  Neurological: She is alert and oriented to person, place, and time. She has normal reflexes.   Skin: Skin is warm and dry.    MAU Course  Procedures  MDM Results for orders placed or performed during the hospital encounter of 09/16/16 (from the past 24 hour(s))  CBC     Status: Abnormal   Collection Time: 09/16/16 11:58 PM  Result Value Ref Range   WBC 16.1 (H) 4.0 - 10.5 K/uL   RBC 3.97 3.87 - 5.11 MIL/uL   Hemoglobin 12.5 12.0 - 15.0 g/dL   HCT 29.5 62.1 - 30.8 %   MCV 91.9 78.0 - 100.0 fL   MCH 31.5 26.0 -  34.0 pg   MCHC 34.2 30.0 - 36.0 g/dL   RDW 78.214.3 95.611.5 - 21.315.5 %   Platelets 229 150 - 400 K/uL    Assessment and Plan  Abnormal Uterine Bleeding  Plan: Explained bleeding may increase after childbirth during menses Recommended utilizing an IUD (Mirena) Plans to contact [facility] for depression screening   Marlis EdelsonWalidah N Karim, CNM 09/17/2016 12:45 AM

## 2016-09-17 DIAGNOSIS — N920 Excessive and frequent menstruation with regular cycle: Secondary | ICD-10-CM

## 2016-09-17 LAB — CBC
HCT: 36.5 % (ref 36.0–46.0)
Hemoglobin: 12.5 g/dL (ref 12.0–15.0)
MCH: 31.5 pg (ref 26.0–34.0)
MCHC: 34.2 g/dL (ref 30.0–36.0)
MCV: 91.9 fL (ref 78.0–100.0)
Platelets: 229 10*3/uL (ref 150–400)
RBC: 3.97 MIL/uL (ref 3.87–5.11)
RDW: 14.3 % (ref 11.5–15.5)
WBC: 16.1 10*3/uL — ABNORMAL HIGH (ref 4.0–10.5)

## 2016-09-17 MED ORDER — IBUPROFEN 600 MG PO TABS: 600.0000 mg | ORAL_TABLET | Freq: Four times a day (QID) | ORAL | 1 refills | Status: DC

## 2016-09-17 NOTE — Discharge Instructions (Signed)
Dysfunctional Uterine Bleeding Dysfunctional uterine bleeding is abnormal bleeding from the uterus. Dysfunctional uterine bleeding includes:  A period that comes earlier or later than usual.  A period that is lighter, heavier, or has blood clots.  Bleeding between periods.  Skipping one or more periods.  Bleeding after sexual intercourse.  Bleeding after menopause. Follow these instructions at home: Pay attention to any changes in your symptoms. Follow these instructions to help with your condition: Eating and drinking   Eat well-balanced meals. Include foods that are high in iron, such as liver, meat, shellfish, green leafy vegetables, and eggs.  If you become constipated:  Drink plenty of water.  Eat fruits and vegetables that are high in water and fiber, such as spinach, carrots, raspberries, apples, and mango. Medicines   Take over-the-counter and prescription medicines only as told by your health care provider.  Do not change medicines without talking with your health care provider.  Aspirin or medicines that contain aspirin may make the bleeding worse. Do not take those medicines:  During the week before your period.  During your period.  If you were prescribed iron pills, take them as told by your health care provider. Iron pills help to replace iron that your body loses because of this condition. Activity   If you need to change your sanitary pad or tampon more than one time every 2 hours:  Lie in bed with your feet raised (elevated).  Place a cold pack on your lower abdomen.  Rest as much as possible until the bleeding stops or slows down.  Do not try to lose weight until the bleeding has stopped and your blood iron level is back to normal. Other Instructions   For two months, write down:  When your period starts.  When your period ends.  When any abnormal bleeding occurs.  What problems you notice.  Keep all follow up visits as told by your  health care provider. This is important. Contact a health care provider if:  You get light-headed or weak.  You have nausea and vomiting.  You cannot eat or drink without vomiting.  You feel dizzy or have diarrhea while you are taking medicines.  You are taking birth control pills or hormones, and you want to change them or stop taking them. Get help right away if:  You develop a fever or chills.  You need to change your sanitary pad or tampon more than one time per hour.  Your bleeding becomes heavier, or your flow contains clots more often.  You develop pain in your abdomen.  You lose consciousness.  You develop a rash. This information is not intended to replace advice given to you by your health care provider. Make sure you discuss any questions you have with your health care provider. Document Released: 06/15/2000 Document Revised: 11/24/2015 Document Reviewed: 09/13/2014 [employer] Interactive Patient Education  2017 [employer]  

## 2016-09-26 ENCOUNTER — Ambulatory Visit (INDEPENDENT_AMBULATORY_CARE_PROVIDER_SITE_OTHER): Payer: 59 | Admitting: Neurology

## 2016-09-26 ENCOUNTER — Telehealth: Payer: Self-pay

## 2016-09-26 ENCOUNTER — Encounter: Payer: Self-pay | Admitting: Neurology

## 2016-09-26 VITALS — BP 115/71 | HR 117 | Ht 62.0 in

## 2016-09-26 DIAGNOSIS — R42 Dizziness and giddiness: Secondary | ICD-10-CM

## 2016-09-26 DIAGNOSIS — G43409 Hemiplegic migraine, not intractable, without status migrainosus: Secondary | ICD-10-CM | POA: Diagnosis not present

## 2016-09-26 MED ORDER — NORTRIPTYLINE HCL 25 MG PO CAPS: 25.0000 mg | ORAL_CAPSULE | Freq: Every day | ORAL | 11 refills | Status: DC

## 2016-09-26 MED ORDER — SUMATRIPTAN SUCCINATE 100 MG PO TABS: 100.0000 mg | ORAL_TABLET | Freq: Once | ORAL | 12 refills | Status: DC | PRN

## 2016-09-26 NOTE — Patient Instructions (Addendum)
Remember to drink plenty of fluid, eat healthy meals and do not skip any meals. Try to eat protein with a every meal and eat a healthy snack such as fruit or nuts in between meals. Try to keep a regular sleep-wake schedule and try to exercise daily, particularly in the form of walking, 20-30 minutes a day, if you can.   As far as your medications are concerned, I would like to suggest: Imitrex(Sumatriptan): Please take one tablet at the onset of your headache. If it does not improve the symptoms please take one additional tablet in 2 hours. Do not take more then 2 tablets in 24hrs. Do not take use more then 2 to 3 times in a week. Take with Cambia or ibuprofen (not both)  Increase Nortriptyline 25mg  at night   I would like to see you back in 1 year, sooner if we need to. Please call us with any interim questions, concerns, problems, updates or refill requests.   Our phone number is [phone]. We also have an after hours call service for urgent matters and there is a physician on-call for urgent questions. For any emergencies you know to call 911 or go to the nearest emergency room   Orthostatic Hypotension Orthostatic hypotension is a sudden drop in blood pressure that happens when you quickly change positions, such as when you get up from a seated or lying position. Blood pressure is a measurement of how strongly, or weakly, your blood is pressing against the walls of your arteries. Arteries are blood vessels that carry blood from your heart throughout your body. When blood pressure is too low, you may not get enough blood to your brain or to the rest of your organs. This can cause weakness, light-headedness, rapid heartbeat, and fainting. This can last for just a few seconds or for up to a few minutes. Orthostatic hypotension is usually not a serious problem. However, if it happens frequently or gets worse, it may be a sign of something more serious. What are the causes? This condition may be  caused by:  Sudden changes in posture, such as standing up quickly after you have been sitting or lying down.  Blood loss.  Loss of body fluids (dehydration).  Heart problems.  Hormone (endocrine) problems.  Pregnancy.  Severe infection.  Lack of certain nutrients.  Severe allergic reactions (anaphylaxis).  Certain medicines, such as blood pressure medicine or medicines that make the body lose excess fluids (diuretics). Sometimes, this condition can be caused by not taking medicine as directed, such as taking too much of a certain medicine. What increases the risk? Certain factors can make you more likely to develop orthostatic hypotension, including:  Age. Risk increases as you get older.  Conditions that affect the heart or the central nervous system.  Taking certain medicines, such as blood pressure medicine or diuretics.  Being pregnant. What are the signs or symptoms? Symptoms of this condition may include:  Weakness.  Light-headedness.  Dizziness.  Blurred vision.  Fatigue.  Rapid heartbeat.  Fainting, in severe cases. How is this diagnosed? This condition is diagnosed based on:  Your medical history.  Your symptoms.  Your blood pressure measurement. Your health care provider will check your blood pressure when you are:  Lying down.  Sitting.  Standing. A blood pressure reading is recorded as two numbers, such as "120 over 80" (or 120/80). The first ("top") number is called the systolic pressure. It is a measure of the pressure in your arteries as  your heart beats. The second ("bottom") number is called the diastolic pressure. It is a measure of the pressure in your arteries when your heart relaxes between beats. Blood pressure is measured in a unit called mm Hg. Healthy blood pressure for adults is 120/80. If your blood pressure is below 90/60, you may be diagnosed with hypotension. Other information or tests that may be used to diagnose  orthostatic hypotension include:  Your other vital signs, such as your heart rate and temperature.  Blood tests.  Tilt table test. For this test, you will be safely secured to a table that moves you from a lying position to an upright position. Your heart rhythm and blood pressure will be monitored during the test. How is this treated? Treatment for this condition may include:  Changing your diet. This may involve eating more salt (sodium) or drinking more water.  Taking medicines to raise your blood pressure.  Changing the dosage of certain medicines you are taking that might be lowering your blood pressure.  Wearing compression stockings. These stockings help to prevent blood clots and reduce swelling in your legs. In some cases, you may need to go to the hospital for:  Fluid replacement. This means you will receive fluids through an IV tube.  Blood replacement. This means you will receive donated blood through an IV tube (transfusion).  Treating an infection or heart problems, if this applies.  Monitoring. You may need to be monitored while medicines that you are taking wear off. Follow these instructions at home: Eating and drinking    Drink enough fluid to keep your urine clear or pale yellow.  Eat a healthy diet and follow instructions from your health care provider about eating or drinking restrictions. A healthy diet includes:  Fresh fruits and vegetables.  Whole grains.  Lean meats.  Low-fat dairy products.  Eat extra salt only as directed. Do not add extra salt to your diet unless your health care provider told you to do that.  Eat frequent, small meals.  Avoid standing up suddenly after eating. Medicines   Take over-the-counter and prescription medicines only as told by your health care provider.  Follow instructions from your health care provider about changing the dosage of your current medicines, if this applies.  Do not stop or adjust any of your  medicines on your own. General instructions   Wear compression stockings as told by your health care provider.  Get up slowly from lying down or sitting positions. This gives your blood pressure a chance to adjust.  Avoid hot showers and excessive heat as directed by your health care provider.  Return to your normal activities as told by your health care provider. Ask your health care provider what activities are safe for you.  Do not use any products that contain nicotine or tobacco, such as cigarettes and e-cigarettes. If you need help quitting, ask your health care provider.  Keep all follow-up visits as told by your health care provider. This is important. Contact a health care provider if:  You vomit.  You have diarrhea.  You have a fever for more than 2-3 days.  You feel more thirsty than usual.  You feel weak and tired. Get help right away if:  You have chest pain.  You have a fast or irregular heartbeat.  You develop numbness in any part of your body.  You cannot move your arms or your legs.  You have trouble speaking.  You become sweaty or feel lightheaded.  You faint.  You feel short of breath.  You have trouble staying awake.  You feel confused. This information is not intended to replace advice given to you by your health care provider. Make sure you discuss any questions you have with your health care provider. Document Released: 06/08/2002 Document Revised: 03/06/2016 Document Reviewed: 12/09/2015 Elsevier Interactive Patient Education  2017 Elsevier Inc.   Sumatriptan tablets What is this medicine? SUMATRIPTAN (soo ma TRIP tan) is used to treat migraines with or without aura. An aura is a strange feeling or visual disturbance that warns you of an attack. It is not used to prevent migraines. This medicine may be used for other purposes; ask your health care provider or pharmacist if you have questions. COMMON BRAND NAME(S): Imitrex, Migraine  Pack What should I tell my health care provider before I take this medicine? They need to know if you have any of these conditions: -circulation problems in fingers and toes -diabetes -heart disease -high blood pressure -high cholesterol -history of irregular heartbeat -history of stroke -kidney disease -liver disease -postmenopausal or surgical removal of uterus and ovaries -seizures -smoke tobacco -stomach or intestine problems -an unusual or allergic reaction to sumatriptan, other medicines, foods, dyes, or preservatives -pregnant or trying to get pregnant -breast-feeding How should I use this medicine? Take this medicine by mouth with a glass of water. Follow the directions on the prescription label. This medicine is taken at the first symptoms of a migraine. It is not for everyday use. If your migraine headache returns after one dose, you can take another dose as directed. You must leave at least 2 hours between doses, and do not take more than 100 mg as a single dose. Do not take more than 200 mg total in any 24 hour period. If there is no improvement at all after the first dose, do not take a second dose without talking to your doctor or health care professional. Do not take your medicine more often than directed. Talk to your pediatrician regarding the use of this medicine in children. Special care may be needed. Overdosage: If you think you have taken too much of this medicine contact a poison control center or emergency room at once. NOTE: This medicine is only for you. Do not share this medicine with others. What if I miss a dose? This does not apply; this medicine is not for regular use. What may interact with this medicine? Do not take this medicine with any of the following medicines: -cocaine -ergot alkaloids like dihydroergotamine, ergonovine, ergotamine, methylergonovine -feverfew -MAOIs like Carbex, Eldepryl, Marplan, Nardil, and Parnate -other medicines for migraine  headache like almotriptan, eletriptan, frovatriptan, naratriptan, rizatriptan, zolmitriptan -tryptophan This medicine may also interact with the following medications: -certain medicines for depression, anxiety, or psychotic disturbances This list may not describe all possible interactions. Give your health care provider a list of all the medicines, herbs, non-prescription drugs, or dietary supplements you use. Also tell them if you smoke, drink alcohol, or use illegal drugs. Some items may interact with your medicine. What should I watch for while using this medicine? Only take this medicine for a migraine headache. Take it if you get warning symptoms or at the start of a migraine attack. It is not for regular use to prevent migraine attacks. You may get drowsy or dizzy. Do not drive, use machinery, or do anything that needs mental alertness until you know how this medicine affects you. To reduce dizzy or fainting spells, do not sit  or stand up quickly, especially if you are an older patient. Alcohol can increase drowsiness, dizziness and flushing. Avoid alcoholic drinks. Smoking cigarettes may increase the risk of heart-related side effects from using this medicine. If you take migraine medicines for 10 or more days a month, your migraines may get worse. Keep a diary of headache days and medicine use. Contact your healthcare professional if your migraine attacks occur more frequently. What side effects may I notice from receiving this medicine? Side effects that you should report to your doctor or health care professional as soon as possible: -allergic reactions like skin rash, itching or hives, swelling of the face, lips, or tongue -bloody or watery diarrhea -hallucination, loss of contact with reality -pain, tingling, numbness in the face, hands, or feet -seizures -signs and symptoms of a blood clot such as breathing problems; changes in vision; chest pain; severe, sudden headache; pain,  swelling, warmth in the leg; trouble speaking; sudden numbness or weakness of the face, arm, or leg -signs and symptoms of a dangerous change in heartbeat or heart rhythm like chest pain; dizziness; fast or irregular heartbeat; palpitations, feeling faint or lightheaded; falls; breathing problems -signs and symptoms of a stroke like changes in vision; confusion; trouble speaking or understanding; severe headaches; sudden numbness or weakness of the face, arm, or leg; trouble walking; dizziness; loss of balance or coordination -stomach pain Side effects that usually do not require medical attention (report to your doctor or health care professional if they continue or are bothersome): -changes in taste -facial flushing -headache -muscle cramps -muscle pain -nausea, vomiting -weak or tired This list may not describe all possible side effects. Call your doctor for medical advice about side effects. You may report side effects to FDA at 1-800-FDA-1088. Where should I keep my medicine? Keep out of the reach of children. Store at room temperature between 2 and 30 degrees C (36 and 86 degrees F). Throw away any unused medicine after the expiration date. NOTE: This sheet is a summary. It may not cover all possible information. If you have questions about this medicine, talk to your doctor, pharmacist, or health care provider.  2018 Elsevier/Gold Standard (2015-07-21 12:38:23)
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
## 2016-09-26 NOTE — Telephone Encounter (Signed)
Pt did arrive late.

## 2016-09-26 NOTE — Telephone Encounter (Signed)
Pt no showed her appt  

## 2016-09-30 HISTORY — PX: ANKLE SURGERY: SHX546

## 2016-10-09 ENCOUNTER — Ambulatory Visit: Payer: 59 | Admitting: Advanced Practice Midwife

## 2016-10-11 ENCOUNTER — Telehealth: Payer: Self-pay | Admitting: Neurology

## 2016-10-11 NOTE — Telephone Encounter (Signed)
Meagan [name] calling complaining of still having dizziness. She was seen on 09-26-16 by Dr. Lucia Gaskins and she advised her to drink more water to help with dizziness which has not helped. She said her PCP advised her to call our office. The Meagan [name] is not sure what to do. Please call and discuss.

## 2016-10-11 NOTE — Telephone Encounter (Signed)
Get her in with first available with Eber Jones. When is Carolyn's first avail?

## 2016-10-11 NOTE — Telephone Encounter (Signed)
Pt w/ hx of migraines was seen a couple of weeks ago. C/o being dizzy for 4 months after having her baby. She admitted to not drinking any water. Reports that she has been drinking water since then and the dizziness has not improved. Only has a couple of HAs per year since starting nortriptyline.

## 2016-10-11 NOTE — Telephone Encounter (Signed)
Called pt and scheduled appt w/ Meagan [name] on Mon afternoon.

## 2016-10-12 NOTE — Telephone Encounter (Signed)
Stephanie/[facility] called at the request of [facility] Beal/PA. She said the pt called this morning sts she cannot get out of the bed due to severe dizziness. PA is was wanting to know if the pt could be seen today. Katrina RN came to the phone POD and talked with Meagan [name]. She suggested pt seek the ED if she can't get out of the bed. Meagan [name] will give that pt the choice to the ED or waiting to come to the clinic for appt that was already scheduled with Eber Jones on Monday 4/16.  FYI

## 2016-10-15 ENCOUNTER — Telehealth: Payer: Self-pay | Admitting: *Deleted

## 2016-10-15 ENCOUNTER — Ambulatory Visit: Payer: Self-pay | Admitting: Nurse Practitioner

## 2016-10-15 NOTE — Telephone Encounter (Addendum)
Spoke with patient and informed her that her follow up today will be cancelled due to power outage at office.  10/16/16 This is addendum; computer froze yesterday before completing this note. Patient still wanted to be seen sooner than scheduled FU in May with Dr Lucia Gaskins. Sandy RN scheduled her for FU today. However office is still closed today,  10/16/16  due to power outage.  LVM for patient informing her office  Is still closed, unsure when we can reopen. Advised she call office tomorrow and continue to call and reschedule FU with Enid Skeens, NP prior to FU in May if at all possible. Left office number.

## 2016-10-16 ENCOUNTER — Ambulatory Visit: Payer: Self-pay | Admitting: Nurse Practitioner

## 2016-10-17 ENCOUNTER — Ambulatory Visit: Payer: 59 | Admitting: Advanced Practice Midwife

## 2016-10-25 ENCOUNTER — Telehealth: Payer: Self-pay | Admitting: Neurology

## 2016-10-25 MED ORDER — NORTRIPTYLINE HCL 10 MG PO CAPS: 20.0000 mg | ORAL_CAPSULE | Freq: Every day | ORAL | 11 refills | Status: DC

## 2016-10-25 NOTE — Telephone Encounter (Signed)
I did ask pt if she planned to go to the ED and she said no, she just took a ALPRAZolam (XANAX) 0.25 MG tablet.

## 2016-10-25 NOTE — Telephone Encounter (Signed)
Pt said that since she has been on the increased dosage of nortriptyline (PAMELOR) 25 MG capsule she wakes up panicked and not sure of where she is. Pt said she is shaking and this has just increased since the increase in the nortriptyline.  Please call

## 2016-10-25 NOTE — Telephone Encounter (Signed)
Returned pt Meagan [name]. She reports that she increased nortriptyline from 20 mg to 25 mg about 1 wk ago. At first, she was feeling extremely tired. Over the past 4 days, she has been having panic attacks. Said that her PCP increased her Xanax for hx of anxiety last month. Declined going to ED per phone staff. Advised to go back to taking 20 mg of nortriptyline. Sooner appt scheduled w/ Eber Jones NP next Tues. Pt agreed to call back w/ worsening symptoms or other concerns prior to her appt.

## 2016-10-25 NOTE — Addendum Note (Signed)
Addended by: Donnelly Angelica on: 10/25/2016 11:14 AM   Modules accepted: Orders

## 2016-10-29 ENCOUNTER — Emergency Department (HOSPITAL_COMMUNITY)
Admission: EM | Admit: 2016-10-29 | Discharge: 2016-10-30 | Disposition: A | Discharge status: Home / Self Care | Payer: 59 | Attending: Emergency Medicine | Admitting: Emergency Medicine

## 2016-10-29 ENCOUNTER — Encounter (HOSPITAL_COMMUNITY): Payer: Self-pay

## 2016-10-29 ENCOUNTER — Emergency Department (HOSPITAL_COMMUNITY): Payer: 59

## 2016-10-29 DIAGNOSIS — S82452A Displaced comminuted fracture of shaft of left fibula, initial encounter for closed fracture: Secondary | ICD-10-CM | POA: Diagnosis not present

## 2016-10-29 DIAGNOSIS — X501XXA Overexertion from prolonged static or awkward postures, initial encounter: Secondary | ICD-10-CM | POA: Insufficient documentation

## 2016-10-29 DIAGNOSIS — Y929 Unspecified place or not applicable: Secondary | ICD-10-CM | POA: Insufficient documentation

## 2016-10-29 DIAGNOSIS — Z79899 Other long term (current) drug therapy: Secondary | ICD-10-CM | POA: Diagnosis not present

## 2016-10-29 DIAGNOSIS — S83125A Posterior dislocation of proximal end of tibia, left knee, initial encounter: Secondary | ICD-10-CM | POA: Insufficient documentation

## 2016-10-29 DIAGNOSIS — Y999 Unspecified external cause status: Secondary | ICD-10-CM | POA: Diagnosis not present

## 2016-10-29 DIAGNOSIS — F17210 Nicotine dependence, cigarettes, uncomplicated: Secondary | ICD-10-CM | POA: Diagnosis not present

## 2016-10-29 DIAGNOSIS — J45909 Unspecified asthma, uncomplicated: Secondary | ICD-10-CM | POA: Diagnosis not present

## 2016-10-29 DIAGNOSIS — S82852A Displaced trimalleolar fracture of left lower leg, initial encounter for closed fracture: Secondary | ICD-10-CM | POA: Insufficient documentation

## 2016-10-29 DIAGNOSIS — Y939 Activity, unspecified: Secondary | ICD-10-CM | POA: Insufficient documentation

## 2016-10-29 DIAGNOSIS — S99912A Unspecified injury of left ankle, initial encounter: Secondary | ICD-10-CM | POA: Diagnosis present

## 2016-10-29 DIAGNOSIS — S82252A Displaced comminuted fracture of shaft of left tibia, initial encounter for closed fracture: Secondary | ICD-10-CM

## 2016-10-29 LAB — CBC
HCT: 37.5 % (ref 36.0–46.0)
Hemoglobin: 12.4 g/dL (ref 12.0–15.0)
MCH: 30 pg (ref 26.0–34.0)
MCHC: 33.1 g/dL (ref 30.0–36.0)
MCV: 90.6 fL (ref 78.0–100.0)
Platelets: 251 10*3/uL (ref 150–400)
RBC: 4.14 MIL/uL (ref 3.87–5.11)
RDW: 13.2 % (ref 11.5–15.5)
WBC: 11.1 10*3/uL — ABNORMAL HIGH (ref 4.0–10.5)

## 2016-10-29 LAB — BASIC METABOLIC PANEL
Anion gap: 9 (ref 5–15)
BUN: 8 mg/dL (ref 6–20)
CO2: 23 mmol/L (ref 22–32)
Calcium: 8.9 mg/dL (ref 8.9–10.3)
Chloride: 106 mmol/L (ref 101–111)
Creatinine, Ser: 0.77 mg/dL (ref 0.44–1.00)
GFR calc Af Amer: >60 mL/min (ref >60)
GFR calc non Af Amer: >60 mL/min (ref >60)
Glucose, Bld: 99 mg/dL (ref 65–99)
Potassium: 3.8 mmol/L (ref 3.5–5.1)
Sodium: 138 mmol/L (ref 135–145)

## 2016-10-29 MED ORDER — IBUPROFEN 600 MG PO TABS: 600.0000 mg | ORAL_TABLET | Freq: Four times a day (QID) | ORAL | 0 refills | Status: DC | PRN

## 2016-10-29 MED ORDER — SODIUM CHLORIDE 0.9 % IV BOLUS (SEPSIS): 1000.0000 mL | Freq: Once | INTRAVENOUS | Status: DC

## 2016-10-29 MED ORDER — SODIUM CHLORIDE 0.9 % IV SOLN
INTRAVENOUS | Status: AC | PRN
  Administered 2016-10-29: 1000 mL via INTRAVENOUS

## 2016-10-29 MED ORDER — ETOMIDATE 2 MG/ML IV SOLN: 0.1500 mg/kg | Freq: Once | INTRAVENOUS | Status: DC

## 2016-10-29 MED ORDER — ETOMIDATE 2 MG/ML IV SOLN
INTRAVENOUS | Status: AC | PRN
  Administered 2016-10-29: 12 mg via INTRAVENOUS

## 2016-10-29 MED ORDER — OXYCODONE-ACETAMINOPHEN 5-325 MG PO TABS: 1.0000 | ORAL_TABLET | Freq: Four times a day (QID) | ORAL | 0 refills | Status: DC | PRN

## 2016-10-29 MED ORDER — ONDANSETRON HCL 4 MG/2ML IJ SOLN
4.0000 mg | Freq: Once | INTRAMUSCULAR | Status: AC
  Administered 2016-10-29: 4 mg via INTRAVENOUS
  Filled 2016-10-29: qty 2

## 2016-10-29 MED ORDER — HYDROMORPHONE HCL 1 MG/ML IJ SOLN
1.0000 mg | Freq: Once | INTRAMUSCULAR | Status: AC
Start: 2016-10-29 — End: 2016-10-29
  Administered 2016-10-29: 1 mg via INTRAVENOUS
  Filled 2016-10-29: qty 1

## 2016-10-29 MED ORDER — MORPHINE SULFATE (PF) 4 MG/ML IV SOLN
4.0000 mg | Freq: Once | INTRAVENOUS | Status: AC
  Administered 2016-10-29: 4 mg via INTRAVENOUS
  Filled 2016-10-29: qty 1

## 2016-10-29 NOTE — Discharge Instructions (Signed)
Please read and follow all provided instructions.  Your diagnoses today include:  1. Closed posterior dislocation of proximal end of left tibia, initial encounter   2. Closed displaced comminuted fracture of shaft of left tibia, initial encounter   3. Closed displaced comminuted fracture of shaft of left fibula, initial encounter     Tests performed today include: Vital signs. See below for your results today.   Medications prescribed:  Take as prescribed   Home care instructions:  Follow any educational materials contained in this packet.  Follow-up instructions: Please follow-up with Orthopedics for further evaluation of symptoms and treatment  YOUR APPOINTMENT IS ON Wednesday AT 8:30AM  Return instructions:  Please return to the Emergency Department if you do not get better, if you get worse, or new symptoms OR  - Fever (temperature greater than 101.47F)  - Bleeding that does not stop with holding pressure to the area    -Severe pain (please note that you may be more sore the day after your accident)  - Chest Pain  - Difficulty breathing  - Severe nausea or vomiting  - Inability to tolerate food and liquids  - Passing out  - Skin becoming red around your wounds  - Change in mental status (confusion or lethargy)  - New numbness or weakness    Please return if you have any other emergent concerns.  Additional Information:  Your vital signs today were: BP (!) 122/59    Pulse (!) 129    Temp 97.7 F (36.5 C) (Oral)    Resp 17    Ht  (1.575 m)    Wt 77.1 kg    LMP 10/08/2016 (Exact Date)    SpO2 100%    BMI 31.09 kg/m  If your blood pressure (BP) was elevated above 135/85 this visit, please have this repeated by your doctor within one month. ---------------

## 2016-10-29 NOTE — ED Triage Notes (Signed)
Per ems pt was trying to move the play pen for the kid and when she did pt rolled over her  Left ankle and then heard 2 pops and when she realized that her knee was dislocated down to mid leg. Per ems arrival pt was in pain and looked dislocated. Ems gave  of morphine.

## 2016-10-29 NOTE — ED Provider Notes (Signed)
  Face-to-face evaluation   History: Patient slipped, and fell onto her left leg causing an injury to the left ankle.  Physical exam: Alert, uncomfortable, crying.  Left knee nontender, no swelling or deformity.  Left ankle moderate tenderness primarily lateral with swelling.  Neurovascular intact distally in left foot.  .Sedation Date/Time: 10/29/2016 10:57 PM Performed by: Mancel Bale Authorized by: Mancel Bale   Consent:    Consent obtained:  Verbal and written   Consent given by:  Patient and spouse   Risks discussed:  Inadequate sedation, nausea and vomiting Indications:    Procedure performed:  Fracture reduction   Procedure necessitating sedation performed by:  Physician performing sedation   Intended level of sedation:  Deep Pre-sedation assessment:    Time since last food or drink:  4 hours   ASA classification: class 1 - normal, healthy patient     Neck mobility: normal     Mouth opening:  3 or more finger widths   Mallampati score:  II - soft palate, uvula, fauces visible   Pre-sedation assessment completed:  10/29/2016 10:40 PM Immediate pre-procedure details:    Reassessment: Patient reassessed immediately prior to procedure     Reviewed: vital signs     Verified: bag valve mask available, emergency equipment available, intubation equipment available, IV patency confirmed, oxygen available and suction available   Procedure details (see MAR for exact dosages):    Sedation start time:  10/29/2016 10:45 PM   Preoxygenation:  Nasal cannula   Sedation:  Etomidate   Intra-procedure monitoring:  Blood pressure monitoring, cardiac monitor, continuous capnometry, continuous pulse oximetry, frequent LOC assessments and frequent vital sign checks   Intra-procedure events: none     Sedation end time:  10/29/2016 10:59 PM Post-procedure details:    Post-sedation assessment completed:  10/29/2016 11:00 PM   Attendance: Constant attendance by certified staff until patient  recovered     Recovery: Patient returned to pre-procedure baseline     Estimated blood loss (see I/O flowsheets): no     Post-sedation assessments completed and reviewed: airway patency, cardiovascular function, hydration status and mental status     Post-sedation assessments completed and reviewed: nausea/vomiting not reviewed and pain level not reviewed     Patient is stable for discharge or admission: yes     Patient tolerance:  Tolerated well, no immediate complications Comments:     Left ankle fracture dislocation reduced and splinted, by me with assistance of PA    SPLINT APPLICATION Date/Time: 11:01 PM Authorized by: Flint Melter Consent: Verbal consent obtained. Risks and benefits: risks, benefits and alternatives were discussed Consent given by: patient Splint applied by: me and orthopedic technician Location details: Left lower leg and foot Splint type: Posterior splint with stirrups Supplies used: Ortho-Glass Post-procedure: The splinted body part was neurovascularly unchanged following the procedure. Patient tolerance: Patient tolerated the procedure well with no immediate complications.      Medical screening examination/treatment/procedure(s) were conducted as a shared visit with non-physician practitioner(s) and myself.  I personally evaluated the patient during the encounter   Mancel Bale, MD 11/01/16 [phone]

## 2016-10-29 NOTE — ED Notes (Signed)
Patient had procedural sedation done for reduction of left ankle dislocation.  Procedure went well and patient tolerated well.  Aldrete score after sedation was a 10 and patient had no adverse reaction.

## 2016-10-29 NOTE — ED Provider Notes (Signed)
[facility]-EMERGENCY DEPT Provider Note   CSN: [id number] Arrival date & time: 10/29/16  2016     History   Chief Complaint Chief Complaint  Patient presents with  . Dislocation    HPI Meagan [name] is a 26 y.o. female.  HPI  26 y.o. female, presents to the Emergency Department today complaining of left ankle pain PTA. Pt states that she had a mechanical fall in the yard that caused her left leg to go underneath her body and collapsed on top of it. Notes feeling a popping sensation as well as a "wobbly" ankle. Unable to put weight or ambulated on leg. EMS notified and noted that it looked dislocated. Given  Morphine PTA. No Numbness/tingling. No head trauma. No LOC. No pain currently. No other symptoms noted.    Past Medical History:  Diagnosis Date  . Abdominal cramps 12/06/2015  . Anxiety   . Asthma    inhaler at bedside  . Daily headache   . Diarrhea 06/04/2013  . Hemiplegic migraine    blurred vision, nausea, dizziness, numbness  . Kidney stones   . Mental disorder    huffed inhalants "affected brain"  . Nausea 06/04/2013  . Nausea and vomiting during pregnancy 12/06/2015  . Pregnant 06/04/2013  . Pregnant 10/17/2015  . Pyelonephritis complicating pregnancy, antepartum, first trimester 11/05/2011  . Sore throat 06/04/2013  . Stomach ulcer     Patient Active Problem List   Diagnosis Date Noted  . Abdominal cramps 12/06/2015  . Diarrhea 12/06/2015  . Hx of preeclampsia 11/16/2015  . Supervision of high risk pregnancy, antepartum 11/15/2015  . 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 Smoker 12/16/2013  . Anxiety 06/17/2013    Past Surgical History:  Procedure Laterality Date  . no past surgery      OB History    Gravida Para Term Preterm AB Living   0 0 3   SAB TAB Ectopic Multiple Live Births   0 0 0 0 3       Home Medications    Prior to Admission medications   Medication Sig Start Date End Date Taking? Authorizing Provider  ALPRAZolam (XANAX)  0.25 MG tablet TAKE ONE TABLET BY MOUTH TWICE DAILY AS NEEDED FOR ANXIETY. 06/20/16   Lazaro Arms, MD  ibuprofen (ADVIL,MOTRIN) 600 MG tablet Take 1 tablet (600 mg total) by mouth every 6 (six) hours. 09/17/16   Marlis Edelson, CNM  nortriptyline (PAMELOR) 10 MG capsule Take 2 capsules (20 mg total) by mouth at bedtime. 10/25/16   Anson Fret, MD  SUMAtriptan (IMITREX) 100 MG tablet Take 1 tablet (100 mg total) by mouth once as needed. May repeat in 2 hours if headache persists or recurs. 09/26/16   Anson Fret, MD    Family History Family History  Problem Relation Age of Onset  . Cancer Maternal Grandmother     breast  . Cancer Paternal Grandfather     lung cancer  . Migraines Father   . Hypertension Father   . Hypercholesterolemia Father   . Other Son     ventricular defect  . Hypertension Other     Social History Social History  Substance Use Topics  . Smoking status: Current Every Day Smoker    Packs/day: 1.00    Years: 7.00    Types: Cigarettes  . Smokeless tobacco: Never Used     Comment: current smoker 06/17/2016 2300  . Alcohol use 0.0 oz/week     Comment: occ  Allergies   Codeine and Penicillins   Review of Systems Review of Systems ROS reviewed and all are negative for acute change except as noted in the HPI.  Physical Exam Updated Vital Signs BP 120/74 (BP Location: Right Arm)   Pulse 97   Temp 97.7 F (36.5 C) (Oral)   Resp 20   Ht  (1.575 m)   Wt 77.1 kg   LMP 10/08/2016 (Exact Date)   SpO2 100%   BMI 31.09 kg/m   Physical Exam  Constitutional: She is oriented to person, place, and time. Vital signs are normal. She appears well-developed and well-nourished.  HENT:  Head: Normocephalic.  Right Ear: Hearing normal.  Left Ear: Hearing normal.  Eyes: Conjunctivae and EOM are normal. Pupils are equal, round, and reactive to light.  Cardiovascular: Normal rate and regular rhythm.   Pulmonary/Chest: Effort normal.    Musculoskeletal:  Left Distal tibia TTP with possible deformity anteriorly. Mild swelling. No erythema. NVI with distal pulses appreciated. Knee ROM intact. Cap refill <2sec distally.   Neurological: She is alert and oriented to person, place, and time.  Skin: Skin is warm and dry.  Psychiatric: She has a normal mood and affect. Her speech is normal and behavior is normal. Thought content normal.   ED Treatments / Results  Labs (all labs ordered are listed, but only abnormal results are displayed) Labs Reviewed  CBC - Abnormal; Notable for the following:       Result Value   WBC 11.1 (*)    All other components within normal limits  BASIC METABOLIC PANEL    EKG  EKG Interpretation None       Radiology Dg Tibia/fibula Left  Result Date: 10/29/2016 CLINICAL DATA:  Deformity, swelling and pain after falling at home tonight. EXAM: LEFT TIBIA AND FIBULA - 2 VIEW COMPARISON:  None. FINDINGS: There are comminuted fractures of the distal tibia and fibula, seen to better advantage on the accompanying ankle radiograph series. The proximal and midportions of the tibia and fibula are intact. IMPRESSION: Comminuted fractures of the distal tibia and fibula. Electronically Signed   By: Ellery Plunk M.D.   On: 10/29/2016 21:48   Dg Ankle Complete Left  Result Date: 10/29/2016 CLINICAL DATA:  Post reduction of ankle fracture and dislocation. EXAM: LEFT ANKLE COMPLETE - 3+ VIEW COMPARISON:  None. FINDINGS: Reduction of acute, closed, trimalleolar fracture-dislocation is now noted. Ankle mortise is near anatomic. Transverse fracture of the medial malleolus, coronal fracture of the posterior malleolus and comminuted fracture of the distal fibular diaphysis are again noted with improved alignment. Fine bony detail is limited by overlying fiberglass cast. IMPRESSION: Improved alignment status post reduction of trimalleolar fracture-dislocation of the left ankle. Electronically Signed   By: Tollie Eth M.D.   On: 10/29/2016 23:45   Dg Ankle Complete Left  Result Date: 10/29/2016 CLINICAL DATA:  Slip and fall injury at home, with persistent swelling, deformity and pain. EXAM: LEFT ANKLE COMPLETE - 3+ VIEW COMPARISON:  None. FINDINGS: There is a posterior tibiotalar dislocation. There are comminuted fractures of the distal fibular diaphysis, as well as a transverse medial malleolus fracture and coronal posterior malleolus fracture. Subtalar articulations appear intact. IMPRESSION: Fracture-dislocation at the ankle Electronically Signed   By: Ellery Plunk M.D.   On: 10/29/2016 21:49   Dg Knee Complete 4 Views Left  Result Date: 10/29/2016 CLINICAL DATA:  Pain, swelling and discoloration after slip and fall injury at home tonight. EXAM: LEFT KNEE - COMPLETE  4+ VIEW COMPARISON:  None. FINDINGS: No evidence of fracture, dislocation, or joint effusion. No evidence of arthropathy or other focal bone abnormality. Soft tissues are unremarkable. IMPRESSION: Negative. Electronically Signed   By: Ellery Plunk M.D.   On: 10/29/2016 21:50    Procedures Reduction of ankle fracture/dislocation Date/Time: 10/29/2016 11:08 PM Performed by: Audry Pili Authorized by: Audry Pili  Consent: Verbal consent obtained. Written consent obtained. Risks and benefits: risks, benefits and alternatives were discussed Consent given by: patient Patient understanding: patient states understanding of the procedure being performed Patient consent: the patient's understanding of the procedure matches consent given Procedure consent: procedure consent matches procedure scheduled Relevant documents: relevant documents present and verified Site marked: the operative site was marked Imaging studies: imaging studies available Required items: required blood products, implants, devices, and special equipment available Patient identity confirmed: verbally with patient and arm band Time out: Immediately prior to  procedure a "time out" was called to verify the correct patient, procedure, equipment, support staff and site/side marked as required.  Sedation: Patient sedated: yes Sedation type: moderate (conscious) sedation Sedatives: etomidate Vitals: Vital signs were monitored during sedation. Patient tolerance: Patient tolerated the procedure well with no immediate complications    (including critical care time)  Medications Ordered in ED Medications  HYDROmorphone (DILAUDID) injection 1 mg (not administered)  ondansetron (ZOFRAN) injection 4 mg (not administered)  morphine 4 MG/ML injection 4 mg (not administered)  sodium chloride 0.9 % bolus 1,000 mL (not administered)   Initial Impression / Assessment and Plan / ED Course  I have reviewed the triage vital signs and the nursing notes.  Pertinent labs & imaging results that were available during my care of the patient were reviewed by me and considered in my medical decision making (see chart for details).  Final Clinical Impressions(s) / ED Diagnoses  {I have reviewed and evaluated the relevant laboratory values. {I have reviewed and evaluated the relevant imaging studies.  {I have reviewed the relevant previous healthcare records.  {I obtained HPI from historian. {Patient discussed with supervising physician.  ED Course:  Assessment: Mechanical fall with obvious deformity on exam at distal aspect of tibia. NVI with pulses appreciated. Patient X-Ray shows posterior tibiotalar dislocation. Comminuted fracture of distal fibular diaphysis as well as transverse medical malleolus fracture and coronal posterior malleolus fracture. . Consult to orthopedics (Dr. Darci Current). Will reduce in ED and place splint. Post reduction films.  11:41 PM- Successful reduction of ankle fracture/dislocation. Area NVI with distal pulses appreciated. Cap refill <2sec. Given crutches and NWB. Follow up with Ortho Wednesday Morning at 8:30AM. CT ordered.    Disposition/Plan:  DC Home Additional Verbal discharge instructions given and discussed with patient.  Pt Instructed to f/u with Ortho in the next week for evaluation and treatment of symptoms. Return precautions given Pt acknowledges and agrees with plan  Supervising Physician Mancel Bale, MD  Final diagnoses:  Closed posterior dislocation of proximal end of left tibia, initial encounter  Closed displaced comminuted fracture of shaft of left tibia, initial encounter  Closed displaced comminuted fracture of shaft of left fibula, initial encounter    New Prescriptions New Prescriptions   No medications on file     Audry Pili, PA-C 10/29/16 2349    Mancel Bale, MD 11/01/16 0347    Mancel Bale, MD 11/01/16 [phone]

## 2016-10-30 ENCOUNTER — Emergency Department (HOSPITAL_COMMUNITY): Payer: 59

## 2016-10-30 ENCOUNTER — Ambulatory Visit: Payer: Self-pay | Admitting: Nurse Practitioner

## 2016-10-30 MED ORDER — MORPHINE SULFATE (PF) 4 MG/ML IV SOLN
4.0000 mg | Freq: Once | INTRAVENOUS | Status: AC
  Administered 2016-10-30: 4 mg via INTRAVENOUS
  Filled 2016-10-30: qty 1

## 2016-10-30 NOTE — ED Notes (Signed)
Pt called out asking what she was waiting for. This RN informed the pt that we are waiting for the pt to go to CT of her ankle. Pt verbalized understanding

## 2016-10-31 ENCOUNTER — Encounter: Payer: Self-pay | Admitting: Nurse Practitioner

## 2016-11-01 ENCOUNTER — Ambulatory Visit: Payer: 59 | Admitting: Nurse Practitioner

## 2016-12-04 ENCOUNTER — Ambulatory Visit: Payer: 59 | Admitting: Nurse Practitioner

## 2016-12-05 ENCOUNTER — Encounter: Payer: Self-pay | Admitting: Nurse Practitioner

## 2016-12-25 ENCOUNTER — Other Ambulatory Visit: Payer: Self-pay | Admitting: Obstetrics & Gynecology

## 2017-02-14 ENCOUNTER — Telehealth: Payer: Self-pay | Admitting: Obstetrics & Gynecology

## 2017-02-15 ENCOUNTER — Other Ambulatory Visit: Payer: Self-pay | Admitting: Obstetrics & Gynecology

## 2017-02-15 MED ORDER — ALPRAZOLAM 0.25 MG PO TABS: 0.2500 mg | ORAL_TABLET | Freq: Two times a day (BID) | ORAL | 5 refills | Status: DC | PRN

## 2017-02-15 NOTE — Telephone Encounter (Signed)
Informed pt that prescription was available for pt to pick up at her convenience.

## 2017-02-21 ENCOUNTER — Other Ambulatory Visit: Payer: Self-pay | Admitting: Obstetrics & Gynecology

## 2017-02-21 ENCOUNTER — Telehealth: Payer: Self-pay | Admitting: Obstetrics & Gynecology

## 2017-02-21 MED ORDER — ALPRAZOLAM 0.5 MG PO TABS: 0.5000 mg | ORAL_TABLET | Freq: Three times a day (TID) | ORAL | 3 refills | Status: DC | PRN

## 2017-02-21 NOTE — Telephone Encounter (Signed)
Called patient but mailbox full

## 2017-05-28 ENCOUNTER — Other Ambulatory Visit: Payer: Self-pay

## 2017-05-28 ENCOUNTER — Encounter (HOSPITAL_COMMUNITY): Payer: Self-pay | Admitting: *Deleted

## 2017-05-28 ENCOUNTER — Telehealth: Payer: Self-pay | Admitting: Obstetrics & Gynecology

## 2017-05-28 ENCOUNTER — Inpatient Hospital Stay (HOSPITAL_COMMUNITY)
Admission: AD | Admit: 2017-05-28 | Discharge: 2017-05-28 | Disposition: A | Discharge status: Home / Self Care | Payer: 59 | Source: Ambulatory Visit | Attending: Family Medicine | Admitting: Family Medicine

## 2017-05-28 DIAGNOSIS — F17210 Nicotine dependence, cigarettes, uncomplicated: Secondary | ICD-10-CM | POA: Insufficient documentation

## 2017-05-28 DIAGNOSIS — F410 Panic disorder [episodic paroxysmal anxiety] without agoraphobia: Secondary | ICD-10-CM

## 2017-05-28 DIAGNOSIS — K2100 Gastro-esophageal reflux disease with esophagitis, without bleeding: Secondary | ICD-10-CM

## 2017-05-28 DIAGNOSIS — Z3A01 Less than 8 weeks gestation of pregnancy: Secondary | ICD-10-CM | POA: Insufficient documentation

## 2017-05-28 DIAGNOSIS — O99341 Other mental disorders complicating pregnancy, first trimester: Secondary | ICD-10-CM | POA: Insufficient documentation

## 2017-05-28 DIAGNOSIS — J45909 Unspecified asthma, uncomplicated: Secondary | ICD-10-CM | POA: Insufficient documentation

## 2017-05-28 DIAGNOSIS — Z880 Allergy status to penicillin: Secondary | ICD-10-CM | POA: Insufficient documentation

## 2017-05-28 DIAGNOSIS — O99611 Diseases of the digestive system complicating pregnancy, first trimester: Secondary | ICD-10-CM | POA: Insufficient documentation

## 2017-05-28 DIAGNOSIS — O99331 Smoking (tobacco) complicating pregnancy, first trimester: Secondary | ICD-10-CM | POA: Insufficient documentation

## 2017-05-28 DIAGNOSIS — Z882 Allergy status to sulfonamides status: Secondary | ICD-10-CM | POA: Insufficient documentation

## 2017-05-28 DIAGNOSIS — O99511 Diseases of the respiratory system complicating pregnancy, first trimester: Secondary | ICD-10-CM | POA: Insufficient documentation

## 2017-05-28 DIAGNOSIS — Z79899 Other long term (current) drug therapy: Secondary | ICD-10-CM | POA: Insufficient documentation

## 2017-05-28 DIAGNOSIS — O219 Vomiting of pregnancy, unspecified: Secondary | ICD-10-CM

## 2017-05-28 LAB — URINALYSIS, ROUTINE W REFLEX MICROSCOPIC
Bilirubin Urine: NEGATIVE
Glucose, UA: NEGATIVE mg/dL
Hgb urine dipstick: NEGATIVE
Ketones, ur: NEGATIVE mg/dL
Nitrite: POSITIVE — AB
Protein, ur: NEGATIVE mg/dL
Specific Gravity, Urine: 1.016 (ref 1.005–1.030)
pH: 6 (ref 5.0–8.0)

## 2017-05-28 LAB — POCT PREGNANCY, URINE: Preg Test, Ur: POSITIVE — AB

## 2017-05-28 MED ORDER — OMEPRAZOLE 20 MG PO CPDR: 20.0000 mg | DELAYED_RELEASE_CAPSULE | Freq: Every day | ORAL | 5 refills | Status: DC

## 2017-05-28 MED ORDER — ONDANSETRON 8 MG PO TBDP
8.0000 mg | ORAL_TABLET | Freq: Once | ORAL | Status: AC
  Administered 2017-05-28: 8 mg via ORAL
  Filled 2017-05-28: qty 1

## 2017-05-28 MED ORDER — GI COCKTAIL ~~LOC~~
30.0000 mL | Freq: Once | ORAL | Status: AC
Start: 2017-05-28 — End: 2017-05-28
  Administered 2017-05-28: 30 mL via ORAL
  Filled 2017-05-28: qty 30

## 2017-05-28 MED ORDER — ONDANSETRON 4 MG PO TBDP: 4.0000 mg | ORAL_TABLET | Freq: Four times a day (QID) | ORAL | 0 refills | Status: DC | PRN

## 2017-05-28 NOTE — MAU Note (Signed)
Pt came to nurses desk saying she felt flushed, dizzy and like she was going to pass out. Took pt back to room and obtained vital signs. Midwife came to bedside and pt stated that she had taken a Xanax that was prescribed to her after receiving medications here. Pt was upset and crying due to family stress, CNM offered pt outside resources to help manage her anxiety.

## 2017-05-28 NOTE — MAU Provider Note (Signed)
Chief Complaint: Dysphagia   First Provider Initiated Contact with Patient 05/28/17 1806      SUBJECTIVE HPI: Meagan [name] is a 26 y.o. [id number] at [redacted]w[redacted]d by LMP who presents to maternity admissions reporting the sensation that something is caught in her throat x 3-4 days. It started when she woke up 4 days ago with a sore spot in her throat, then it progressed to the sensation that there is a lump or something caught in the back of her throat.  She has no shortness of breath or pain.  She has tried to cough or vomit to bring it up but nothing comes up. She denies any recent heartburn or vomiting but does report daily nausea. She desires Zofran for nausea as this worked for her in her previous pregnancy.  She has hx of stomach ulcer and was taking Prilosec in the past but is not taking the medication currently.  She is a smoker. She denies abdominal [pain, vaginal bleeding, vaginal itching/burning, urinary symptoms, h/a, dizziness,or fever/chills.     HPI  Past Medical History:  Diagnosis Date  . Abdominal cramps 12/06/2015  . Anxiety   . Asthma    inhaler at bedside  . Daily headache   . Diarrhea 06/04/2013  . Hemiplegic migraine    blurred vision, nausea, dizziness, numbness  . Kidney stones   . Mental disorder    huffed inhalants "affected brain"  . Nausea 06/04/2013  . Nausea and vomiting during pregnancy 12/06/2015  . Pregnant 06/04/2013  . Pregnant 10/17/2015  . Pyelonephritis complicating pregnancy, antepartum, first trimester 11/05/2011  . Sore throat 06/04/2013  . Stomach ulcer    Past Surgical History:  Procedure Laterality Date  . no past surgery     Social History   Socioeconomic History  . Marital status: Married    Spouse name: Not on file  . Number of children: 3  . Years of education: Not on file  . Highest education level: Not on file  Social Needs  . Financial resource strain: Not on file  . Food insecurity - worry: Not on file  . Food insecurity -  inability: Not on file  . Transportation needs - medical: Not on file  . Transportation needs - non-medical: Not on file  Occupational History  . Not on file  Tobacco Use  . Smoking status: Current Every Day Smoker    Packs/day: 1.00    Years: 7.00    Pack years: 7.00    Types: Cigarettes  . Smokeless tobacco: Never Used  . Tobacco comment: current smoker 06/17/2016 2300  Substance and Sexual Activity  . Alcohol use: Yes    Alcohol/week: 0.0 oz    Comment: occ  . Drug use: No    Comment: huffed inhalants in past, "affected Brain"  . Sexual activity: Yes    Birth control/protection: None, Rhythm  Other Topics Concern  . Not on file  Social History Narrative   Lives at home with husband and kids.   Right handed.   Six sodas per day.   No current facility-administered medications on file prior to encounter.    Current Outpatient Medications on File Prior to Encounter  Medication Sig Dispense Refill  . acetaminophen (TYLENOL) 500 MG tablet Take 1,000 mg by mouth every 6 (six) hours as needed for moderate pain.    Marland Kitchen albuterol (PROVENTIL HFA;VENTOLIN HFA) 108 (90 Base) MCG/ACT inhaler Inhale 1-2 puffs into the lungs every 4 (four) hours as needed.    Marland Kitchen  ALPRAZolam (XANAX) 0.5 MG tablet Take 1 tablet (0.5 mg total) by mouth 3 (three) times daily as needed. for anxiety 90 tablet 3  . nortriptyline (PAMELOR) 25 MG capsule Take 25 mg by mouth at bedtime.    . Prenatal Vit-Fe Fumarate-FA (PRENATAL MULTIVITAMIN) TABS tablet Take 1 tablet by mouth daily at 12 noon.    Marland Kitchen. ibuprofen (ADVIL,MOTRIN) 600 MG tablet Take 1 tablet (600 mg total) by mouth every 6 (six) hours as needed. 30 tablet 0  . nortriptyline (PAMELOR) 10 MG capsule Take 2 capsules (20 mg total) by mouth at bedtime. 60 capsule 11  . omeprazole (PRILOSEC) 20 MG capsule TAKE ONE CAPSULE BY MOUTH DAILY. 30 capsule 5  . oxyCODONE-acetaminophen (PERCOCET/ROXICET) 5-325 MG tablet Take 1 tablet by mouth every 6 (six) hours as needed  for severe pain. (Patient not taking: Reported on 05/28/2017) 15 tablet 0  . SUMAtriptan (IMITREX) 100 MG tablet Take 1 tablet (100 mg total) by mouth once as needed. May repeat in 2 hours if headache persists or recurs. (Patient not taking: Reported on 05/28/2017) 10 tablet 12   Allergies  Allergen Reactions  . Codeine Nausea And Vomiting and Other (See Comments)    Vertigo  . Penicillins Rash and Other (See Comments)    Unknown:  Childhood reaction.  Has patient had a PCN reaction causing immediate rash, facial/tongue/throat swelling, SOB or lightheadedness with hypotension: Unknown Has patient had a PCN reaction causing severe rash involving mucus membranes or skin necrosis: Unknown Has patient had a PCN reaction that required hospitalization No Has patient had a PCN reaction occurring within the last 10 years: No If all of the above answers are "NO", then may pro  . Sulfamethoxazole-Trimethoprim Hives    ROS:  Review of Systems  Constitutional: Negative for chills, fatigue and fever.  HENT: Positive for sore throat.   Respiratory: Negative for shortness of breath.   Cardiovascular: Negative for chest pain.  Gastrointestinal: Positive for nausea. Negative for abdominal pain and vomiting.  Genitourinary: Negative for difficulty urinating, dysuria, flank pain, pelvic pain, vaginal bleeding, vaginal discharge and vaginal pain.  Neurological: Negative for dizziness and headaches.  Psychiatric/Behavioral: Negative.      I have reviewed patient's Past Medical Hx, Surgical Hx, Family Hx, Social Hx, medications and allergies.   Physical Exam   Patient Vitals for the past 24 hrs:  BP Temp Temp src Pulse Resp SpO2 Weight  05/28/17 1856 92/62 - - (!) 112 - - -  05/28/17 1714 127/77 99.2 F (37.3 C) Oral (!) 116 16 100 % 169 lb 12 oz (77 kg)  05/28/17 1713 - - - - - 100 % -   Constitutional: Well-developed, well-nourished female in no acute distress.  HEENT: Throat with mild  erythema, no visible lesions or exudate HEART: mild tachycardia with normal heart sounds, regular rhythm RESP: normal effort, lung sounds clear and equal bilaterally GI: Abd soft, non-tender. Pos BS x 4 MS: Extremities nontender, no edema, normal ROM Neurologic: Alert and oriented x 4.  GU: Neg CVAT.   LAB RESULTS Results for orders placed or performed during the hospital encounter of 05/28/17 (from the past 24 hour(s))  Urinalysis, Routine w reflex microscopic     Status: Abnormal   Collection Time: 05/28/17  5:15 PM  Result Value Ref Range   Color, Urine YELLOW YELLOW   APPearance HAZY (A) CLEAR   Specific Gravity, Urine 1.016 1.005 - 1.030   pH 6.0 5.0 - 8.0   Glucose, UA NEGATIVE  NEGATIVE mg/dL   Hgb urine dipstick NEGATIVE NEGATIVE   Bilirubin Urine NEGATIVE NEGATIVE   Ketones, ur NEGATIVE NEGATIVE mg/dL   Protein, ur NEGATIVE NEGATIVE mg/dL   Nitrite POSITIVE (A) NEGATIVE   Leukocytes, UA LARGE (A) NEGATIVE   RBC / HPF 0-5 0 - 5 RBC/hpf   WBC, UA 6-30 0 - 5 WBC/hpf   Bacteria, UA MANY (A) NONE SEEN   Squamous Epithelial / LPF 6-30 (A) NONE SEEN   Mucus PRESENT   Pregnancy, urine POC     Status: Abnormal   Collection Time: 05/28/17  5:30 PM  Result Value Ref Range   Preg Test, Ur POSITIVE (A) NEGATIVE    --/--/A NEG (12/19 0549)  IMAGING No results found.  MAU Management/MDM: Pt symptoms c/w acid reflux/esophagitis. Discussed with the patient the risk of Zofran use in the first trimester of pregnancy. Risks of use in the first trimester include birth defects in babies, specifically cleft lip/palate.  Pt states understanding and plans to use Zofran sparingly.  Zofran 8 mg ODT and GI cocktail given in MAU.  Pt immediately reported feeling nervous and jittery after mediations given.  No shortness of breath, edema, or hives.  Pt reports she feels like she is having a panic attack.  She took a dose of her Xanax Rx from home when RN not in the room and reports feeling better.  Pt observed x 15 minutes and VS stable, pt well appearing and alert throughout.  Pt tearful reporting that her father is now pulling away from her and not in her life because of her boyfriend.  She has a lot going on and is under a lot of stress.  Encouraged pt to f/u with behavioral health for counseling/medications as needed.  Pt given contact information.  D/C home with plan for pt to f/u at [facility] for prenatal care.     Rx for Zofran and Prilosec sent to pharmacy.  Pt discharged with strict first trimester precautions.  ASSESSMENT 1. GERD with esophagitis   2. Panic attacks   3. Nausea and vomiting during pregnancy prior to [redacted] weeks gestation     PLAN Discharge home Allergies as of 05/28/2017      Reactions   Codeine Nausea And Vomiting, Other (See Comments)   Vertigo   Penicillins Rash, Other (See Comments)   Unknown:  Childhood reaction.  Has patient had a PCN reaction causing immediate rash, facial/tongue/throat swelling, SOB or lightheadedness with hypotension: Unknown Has patient had a PCN reaction causing severe rash involving mucus membranes or skin necrosis: Unknown Has patient had a PCN reaction that required hospitalization No Has patient had a PCN reaction occurring within the last 10 years: No If all of the above answers are "NO", then may pro   Sulfamethoxazole-trimethoprim Hives      Medication List    STOP taking these medications   ibuprofen 600 MG tablet Commonly known as:  ADVIL,MOTRIN   oxyCODONE-acetaminophen 5-325 MG tablet Commonly known as:  PERCOCET/ROXICET     TAKE these medications   acetaminophen 500 MG tablet Commonly known as:  TYLENOL Take 1,000 mg by mouth every 6 (six) hours as needed for moderate pain.   albuterol 108 (90 Base) MCG/ACT inhaler Commonly known as:  PROVENTIL HFA;VENTOLIN HFA Inhale 1-2 puffs into the lungs every 4 (four) hours as needed.   ALPRAZolam 0.5 MG tablet Commonly known as:  XANAX Take 1 tablet (0.5 mg  total) by mouth 3 (three) times daily as needed.  for anxiety   nortriptyline 25 MG capsule Commonly known as:  PAMELOR Take 25 mg by mouth at bedtime.   nortriptyline 10 MG capsule Commonly known as:  PAMELOR Take 2 capsules (20 mg total) by mouth at bedtime.   omeprazole 20 MG capsule Commonly known as:  PRILOSEC Take 1 capsule (20 mg total) by mouth daily.   ondansetron 4 MG disintegrating tablet Commonly known as:  ZOFRAN ODT Take 1 tablet (4 mg total) by mouth every 6 (six) hours as needed for nausea.   prenatal multivitamin Tabs tablet Take 1 tablet by mouth daily at 12 noon.   SUMAtriptan 100 MG tablet Commonly known as:  IMITREX Take 1 tablet (100 mg total) by mouth once as needed. May repeat in 2 hours if headache persists or recurs.        Sharen CounterLisa Leftwich-Kirby Certified Nurse-Midwife 05/28/2017  7:21 PM

## 2017-05-28 NOTE — MAU Note (Signed)
Past 3 or 4 days, has felt like there is a lump or food stuck in her esophagus.   Tried to cough or throw up, to see if it would move, now every time she swallows something it feels stuck.  Went to [facility] today, since she is pregnant, they told her to come here because they couldn't do any xray or testing.

## 2017-05-28 NOTE — Telephone Encounter (Signed)
Mailbox is full, if she calls back we need to see her to Rx meds

## 2017-05-28 NOTE — Telephone Encounter (Signed)
Patient called requesting medication for nausea. She is scheduled for visit for pregnancy test on 06/04/17. Please advise.

## 2017-05-29 ENCOUNTER — Encounter: Payer: Self-pay | Admitting: Advanced Practice Midwife

## 2017-05-29 ENCOUNTER — Other Ambulatory Visit: Payer: Self-pay | Admitting: Advanced Practice Midwife

## 2017-05-29 ENCOUNTER — Ambulatory Visit: Payer: 59 | Admitting: Adult Health

## 2017-05-29 MED ORDER — CEPHALEXIN 500 MG PO CAPS: 500.0000 mg | ORAL_CAPSULE | Freq: Four times a day (QID) | ORAL | 0 refills | Status: DC

## 2017-06-04 ENCOUNTER — Ambulatory Visit: Payer: 59 | Admitting: Adult Health

## 2017-06-05 ENCOUNTER — Telehealth: Payer: Self-pay | Admitting: Obstetrics & Gynecology

## 2017-06-05 ENCOUNTER — Ambulatory Visit (INDEPENDENT_AMBULATORY_CARE_PROVIDER_SITE_OTHER): Payer: 59 | Admitting: Obstetrics and Gynecology

## 2017-06-05 ENCOUNTER — Encounter: Payer: Self-pay | Admitting: Obstetrics and Gynecology

## 2017-06-05 VITALS — BP 132/60 | HR 132 | Ht 62.0 in | Wt 172.4 lb

## 2017-06-05 DIAGNOSIS — Z1389 Encounter for screening for other disorder: Secondary | ICD-10-CM

## 2017-06-05 DIAGNOSIS — B373 Candidiasis of vulva and vagina: Secondary | ICD-10-CM

## 2017-06-05 DIAGNOSIS — B3731 Acute candidiasis of vulva and vagina: Secondary | ICD-10-CM

## 2017-06-05 DIAGNOSIS — Z331 Pregnant state, incidental: Secondary | ICD-10-CM

## 2017-06-05 LAB — POCT URINALYSIS DIPSTICK
Glucose, UA: NEGATIVE
Ketones, UA: NEGATIVE
Nitrite, UA: NEGATIVE
Protein, UA: NEGATIVE

## 2017-06-05 LAB — POCT WET PREP (WET MOUNT)

## 2017-06-05 MED ORDER — MICONAZOLE NITRATE 2 % VA CREA: 1.0000 | TOPICAL_CREAM | Freq: Every day | VAGINAL | Status: DC

## 2017-06-05 NOTE — Progress Notes (Signed)
Patient ID: Meagan [name], female   DOB: 11/12/1990, 26 y.o.   MRN: [id number]   [facility]  @DATE @            Patient name: Meagan [name] MRN [id number]  Date of birth: 10/18/1990  CC & HPI:  Meagan [name] is a 26 y.o. female presenting today for a possible yeast infection. She is 4434w4d pregnant. The patient states that she has been very itchy and thinks that she might have caused her self to bleed from itching too hard. She noticed mild spotting only when wiping. She adds that she never bled with her three other pregnancies. She denies any other symptoms or complaints at this time.  ROS:  ROS +vaginal itching +yellow discharge +spotting All systems are negative except as noted in the HPI and PMH.   Pertinent History Reviewed:   Reviewed: Significant for PREGNANT Medical         Past Medical History:  Diagnosis Date   Abdominal cramps 12/06/2015   Anxiety    Asthma    inhaler at bedside   Daily headache    Diarrhea 06/04/2013   Hemiplegic migraine    blurred vision, nausea, dizziness, numbness   Kidney stones    Mental disorder    huffed inhalants "affected brain"   Nausea 06/04/2013   Nausea and vomiting during pregnancy 12/06/2015   Pregnant 06/04/2013   Pregnant 10/17/2015   Pyelonephritis complicating pregnancy, antepartum, first trimester 11/05/2011   Sore throat 06/04/2013   Stomach ulcer                               Surgical Hx:    Past Surgical History:  Procedure Laterality Date   ANKLE SURGERY Left    no past surgery     Medications: Reviewed & Updated - see associated section                       Current Outpatient Medications:    acetaminophen (TYLENOL) 500 MG tablet, Take 1,000 mg by mouth every 6 (six) hours as needed for moderate pain., Disp: , Rfl:    albuterol (PROVENTIL HFA;VENTOLIN HFA) 108 (90 Base) MCG/ACT inhaler, Inhale 1-2 puffs into the lungs every 4 (four) hours as needed., Disp: , Rfl:     ALPRAZolam (XANAX) 0.5 MG tablet, Take 1 tablet (0.5 mg total) by mouth 3 (three) times daily as needed. for anxiety, Disp: 90 tablet, Rfl: 3   nortriptyline (PAMELOR) 25 MG capsule, Take 25 mg by mouth at bedtime., Disp: , Rfl:    omeprazole (PRILOSEC) 20 MG capsule, Take 1 capsule (20 mg total) by mouth daily., Disp: 30 capsule, Rfl: 5   Prenatal Vit-Fe Fumarate-FA (PRENATAL MULTIVITAMIN) TABS tablet, Take 1 tablet by mouth daily at 12 noon., Disp: , Rfl:    ondansetron (ZOFRAN ODT) 4 MG disintegrating tablet, Take 1 tablet (4 mg total) by mouth every 6 (six) hours as needed for nausea., Disp: 20 tablet, Rfl: 0   Social History: Reviewed -  reports that she has been smoking cigarettes.  She has a 7.00 pack-year smoking history. she has never used smokeless tobacco.  Objective Findings:  Vitals: Blood pressure 132/60, pulse (!) 132, height 5\' 2"  (1.575 m), weight 172 lb 6.4 oz (78.2 kg), last menstrual period 04/20/2017, not currently breastfeeding.  Physical Examination: General appearance - alert, well appearing, and in no distress and oriented  to person, place, and time Mental status - alert, oriented to person, place, and time, normal mood, behavior, speech, dress, motor activity, and thought processes, anxious Pelvic: External genitalia - slightly irratated  Vagina - thick clumpy discharge consistent with yeast WET MOUNT done - results: positive yeast, negative whiff   Assessment & Plan:   A:  1. Normal Pregnancy 8064w4d 2. Vaginal Moniliasis  P:  1. F/u 4 weeks for LROB, or PRN if symptoms do not improve 2. Rx miconazole   By signing my name below, I, Diona BrownerJennifer Gorman, attest that this documentation has been prepared under the direction and in the presence of Tilda BurrowFerguson, John V, MD. Electronically Signed: Diona BrownerJennifer Gorman, Medical Scribe. 06/05/17. 1:58 PM.  I personally performed the services described in this documentation, which was SCRIBED in my presence. The recorded  information has been reviewed and considered accurate. It has been edited as necessary during review. Tilda BurrowJohn V Ferguson, MD

## 2017-06-11 ENCOUNTER — Other Ambulatory Visit: Payer: Self-pay | Admitting: Obstetrics & Gynecology

## 2017-06-11 DIAGNOSIS — O3680X0 Pregnancy with inconclusive fetal viability, not applicable or unspecified: Secondary | ICD-10-CM

## 2017-06-12 ENCOUNTER — Ambulatory Visit (INDEPENDENT_AMBULATORY_CARE_PROVIDER_SITE_OTHER): Payer: 59

## 2017-06-12 DIAGNOSIS — O3680X0 Pregnancy with inconclusive fetal viability, not applicable or unspecified: Secondary | ICD-10-CM

## 2017-06-12 DIAGNOSIS — Z3A01 Less than 8 weeks gestation of pregnancy: Secondary | ICD-10-CM

## 2017-06-12 NOTE — Progress Notes (Addendum)
US 6+5 wks,single IUP w/ys,positive fht 109 bpm,normal ovaries bilat,crl 5.4 mm,small subchorionic hemorrhage 1.7 x .7 x 1 cm

## 2017-06-13 ENCOUNTER — Telehealth: Payer: Self-pay | Admitting: Obstetrics & Gynecology

## 2017-06-13 ENCOUNTER — Other Ambulatory Visit: Payer: Self-pay | Admitting: Obstetrics & Gynecology

## 2017-06-13 MED ORDER — ALPRAZOLAM 0.5 MG PO TABS: 0.5000 mg | ORAL_TABLET | Freq: Three times a day (TID) | ORAL | 3 refills | Status: DC | PRN

## 2017-06-26 ENCOUNTER — Ambulatory Visit (INDEPENDENT_AMBULATORY_CARE_PROVIDER_SITE_OTHER): Payer: 59 | Admitting: Advanced Practice Midwife

## 2017-06-26 ENCOUNTER — Encounter: Payer: Self-pay | Admitting: Advanced Practice Midwife

## 2017-06-26 ENCOUNTER — Encounter: Payer: 59 | Admitting: *Deleted

## 2017-06-26 VITALS — BP 140/58 | HR 105 | Wt 152.0 lb

## 2017-06-26 DIAGNOSIS — Z3A08 8 weeks gestation of pregnancy: Secondary | ICD-10-CM

## 2017-06-26 DIAGNOSIS — O0990 Supervision of high risk pregnancy, unspecified, unspecified trimester: Secondary | ICD-10-CM | POA: Insufficient documentation

## 2017-06-26 DIAGNOSIS — G43409 Hemiplegic migraine, not intractable, without status migrainosus: Secondary | ICD-10-CM

## 2017-06-26 DIAGNOSIS — F17210 Nicotine dependence, cigarettes, uncomplicated: Secondary | ICD-10-CM

## 2017-06-26 DIAGNOSIS — Z3481 Encounter for supervision of other normal pregnancy, first trimester: Secondary | ICD-10-CM

## 2017-06-26 DIAGNOSIS — O99351 Diseases of the nervous system complicating pregnancy, first trimester: Secondary | ICD-10-CM

## 2017-06-26 DIAGNOSIS — O99331 Smoking (tobacco) complicating pregnancy, first trimester: Secondary | ICD-10-CM

## 2017-06-26 DIAGNOSIS — Z1389 Encounter for screening for other disorder: Secondary | ICD-10-CM

## 2017-06-26 DIAGNOSIS — O09291 Supervision of pregnancy with other poor reproductive or obstetric history, first trimester: Secondary | ICD-10-CM

## 2017-06-26 DIAGNOSIS — Z331 Pregnant state, incidental: Secondary | ICD-10-CM

## 2017-06-26 DIAGNOSIS — Z3682 Encounter for antenatal screening for nuchal translucency: Secondary | ICD-10-CM

## 2017-06-26 DIAGNOSIS — O09299 Supervision of pregnancy with other poor reproductive or obstetric history, unspecified trimester: Secondary | ICD-10-CM

## 2017-06-26 DIAGNOSIS — O99341 Other mental disorders complicating pregnancy, first trimester: Secondary | ICD-10-CM

## 2017-06-26 DIAGNOSIS — F410 Panic disorder [episodic paroxysmal anxiety] without agoraphobia: Secondary | ICD-10-CM

## 2017-06-26 LAB — POCT URINALYSIS DIPSTICK
Glucose, UA: NEGATIVE
Ketones, UA: NEGATIVE
Leukocytes, UA: NEGATIVE
Nitrite, UA: NEGATIVE

## 2017-06-26 MED ORDER — NITROFURANTOIN MONOHYD MACRO 100 MG PO CAPS: 100.0000 mg | ORAL_CAPSULE | Freq: Two times a day (BID) | ORAL | 0 refills | Status: DC

## 2017-06-26 NOTE — Progress Notes (Signed)
Subjective:    Meagan [name] is a [id number] 645w5d being seen today for her first obstetrical visit.  Her obstetrical history is significant for  (1) smoker- 1ppd, (2) h/o term SVB x 3, (3) h/o pre-e x 2, (4) anxiety w/ panic attacks currently taking xanax 0.5mg  TID, and polyhydramnios w/last pg. Meagan [name].  Takes Pamelor (nortriptyline) for hemiplegic migraines. Pregnancy history fully reviewed. Want to see a therapist for her anxiety now but doesn't have medicaid yet  FOB is 1st cousin. Last baby 12 months ago.  Patient reports Suprapubic pain/pressure, frequency and  No fever of CVAT.  Has had pyelo in the past, feels like this is a UTI. Wants to know if we can put her on percocet after first trimester d/t the pain she is going to be having d/t this being her 4th baby.  Vitals:   06/26/17 1403  BP: (!) 140/58  Pulse: (!) 105  Weight: 152 lb (68.9 kg)    HISTORY: OB History  Gravida Para Term Preterm AB Living  4 3 3  0 0 3  SAB TAB Ectopic Multiple Live Births  0 0 0 0 3    # Outcome Date GA Lbr Len/2nd Weight Sex Delivery Anes PTL Lv  4 Current           3 Term 06/18/16 5466w0d 04:13 / 00:04 8 lb 3 oz (3.715 kg) F Vag-Spont EPI N LIV     Complications: Polyhydramnios  2 Term 02/13/14 5458w4d 03:14 / 00:05 6 lb 12.1 oz (3.065 kg) F Vag-Spont EPI N LIV  1 Term 05/30/12 3441w1d 15:31 / 04:14 7 lb 14 oz (3.572 kg) M Vag-Spont EPI N LIV     Past Medical History:  Diagnosis Date  . Abdominal cramps 12/06/2015  . Anxiety   . Asthma    inhaler at bedside  . Daily headache   . Diarrhea 06/04/2013  . Hemiplegic migraine    blurred vision, nausea, dizziness, numbness  . Kidney stones   . Mental disorder    huffed inhalants "affected brain"  . Nausea 06/04/2013  . Nausea and vomiting during pregnancy 12/06/2015  . Pregnant 06/04/2013  . Pregnant 10/17/2015  . Pyelonephritis complicating pregnancy, antepartum, first trimester 11/05/2011  . Sore throat 06/04/2013  . Stomach ulcer    Past Surgical  History:  Procedure Laterality Date  . ANKLE SURGERY Left   . no past surgery     Family History  Problem Relation Age of Onset  . Cancer Maternal Grandmother        breast  . Cancer Paternal Grandfather        lung cancer  . Migraines Father   . Hypertension Father   . Hypercholesterolemia Father   . Other Son        ventricular defect  . Hypertension Other      Exam                                      System:     Skin: normal coloration and turgor, no rashes    Neurologic: oriented, normal, normal mood   Extremities: normal strength, tone, and muscle mass   HEENT PERRLA   Mouth/Teeth mucous membranes moist, normal dentition   Neck supple and no masses   Cardiovascular: regular rate and rhythm   Respiratory:  appears well, vitals normal, no respiratory distress, acyanotic   Abdomen: soft, non-tender;  FHR:  160 US        The nature of [facility] with multiple MDs and other Advanced Practice Providers was explained to patient; also emphasized that residents, students are part of our team.  Meagan EvansCatherine is very aware of risks of taking xanax while pregnant and accepts them.   Assessment:    Pregnancy: [id number] Patient Active Problem List   Diagnosis Date Noted  . Supervision of normal pregnancy 06/26/2017  . Diarrhea 12/06/2015  . Hx of preeclampsia 11/16/2015  . 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 Smoker 12/16/2013  . Anxiety 06/17/2013        Plan:     Initial labs drawn. Continue prenatal vitamins  Macrobid 100mg  BID (Presumed UTI, hx pyelo).  Problem list reviewed and updated  Reviewed n/v relief measures and warning s/s to report  Reviewed recommended weight gain based on pre-gravid BMI  Encouraged well-balanced diet Genetic Screening discussed Integrated.  Genetic counseling at [facility] scheduled for 1/31 at 1pm when she should have her medicaid.  ScreenIntegrated Screen: requested.  Ultrasound discussed;  fetal survey: requested.  Return in about 4 weeks (around 07/24/2017) for US:NT+1st IT, LROB.  CRESENZO-DISHMAN,Ara Mano 06/26/2017

## 2017-06-26 NOTE — Patient Instructions (Addendum)
[place] [facility]  For Headaches:   Stay well hydrated, drink enough water so that your urine is clear, sometimes if you are dehydrated you can get headaches  Eat small frequent meals and snacks, sometimes if you are hungry you can get headaches  Sometimes you get headaches during pregnancy from the pregnancy hormones  You can try tylenol (1-2 regular strength 325mg  or 1-2 extra strength 500mg ) as directed on the box. The least amount of medication that works is best.   Cool compresses (cool wet washcloth or ice pack) to area of head that is hurting  You can also try drinking a caffeinated drink to see if this will help  If not helping, try below:  For Prevention of Headaches/Migraines:  CoQ10 100mg  three times daily  Vitamin B2 400mg  daily  Magnesium Oxide 400-600mg  daily  If You Get a Bad Headache/Migraine:  Benadryl 25mg    Magnesium Oxide  1 large Gatorade  2 extra strength Tylenol (1,000mg  total)  1 cup coffee or Coke  If this doesn't help please call us @ [phone]      First Trimester of Pregnancy The first trimester of pregnancy is from week 1 until the end of week 12 (months 1 through 3). A week after a sperm fertilizes an egg, the egg will implant on the wall of the uterus. This embryo will begin to develop into a baby. Genes from you and your partner are forming the baby. The female genes determine whether the baby is a boy or a girl. At 6-8 weeks, the eyes and face are formed, and the heartbeat can be seen on ultrasound. At the end of 12 weeks, all the baby's organs are formed.  Now that you are pregnant, you will want to do everything you can to have a healthy baby. Two of the most important things are to get good prenatal care and to follow your health care provider's instructions. Prenatal care is all the medical care you receive before the baby's birth. This care will help prevent, find, and treat any problems during the pregnancy and childbirth. BODY  CHANGES Your body goes through many changes during pregnancy. The changes vary from woman to woman.   You may gain or lose a couple of pounds at first.  You may feel sick to your stomach (nauseous) and throw up (vomit). If the vomiting is uncontrollable, call your health care provider.  You may tire easily.  You may develop headaches that can be relieved by medicines approved by your health care provider.  You may urinate more often. Painful urination may mean you have a bladder infection.  You may develop heartburn as a result of your pregnancy.  You may develop constipation because certain hormones are causing the muscles that push waste through your intestines to slow down.  You may develop hemorrhoids or swollen, bulging veins (varicose veins).  Your breasts may begin to grow larger and become tender. Your nipples may stick out more, and the tissue that surrounds them (areola) may become darker.  Your gums may bleed and may be sensitive to brushing and flossing.  Dark spots or blotches (chloasma, mask of pregnancy) may develop on your face. This will likely fade after the baby is born.  Your menstrual periods will stop.  You may have a loss of appetite.  You may develop cravings for certain kinds of food.  You may have changes in your emotions from day to day, such as being excited to be pregnant or being concerned  that something may go wrong with the pregnancy and baby.  You may have more vivid and strange dreams.  You may have changes in your hair. These can include thickening of your hair, rapid growth, and changes in texture. Some women also have hair loss during or after pregnancy, or hair that feels dry or thin. Your hair will most likely return to normal after your baby is born. WHAT TO EXPECT AT YOUR PRENATAL VISITS During a routine prenatal visit:  You will be weighed to make sure you and the baby are growing normally.  Your blood pressure will be taken.  Your  abdomen will be measured to track your baby's growth.  The fetal heartbeat will be listened to starting around week 10 or 12 of your pregnancy.  Test results from any previous visits will be discussed. Your health care provider may ask you:  How you are feeling.  If you are feeling the baby move.  If you have had any abnormal symptoms, such as leaking fluid, bleeding, severe headaches, or abdominal cramping.  If you have any questions. Other tests that may be performed during your first trimester include:  Blood tests to find your blood type and to check for the presence of any previous infections. They will also be used to check for low iron levels (anemia) and Rh antibodies. Later in the pregnancy, blood tests for diabetes will be done along with other tests if problems develop.  Urine tests to check for infections, diabetes, or protein in the urine.  An ultrasound to confirm the proper growth and development of the baby.  An amniocentesis to check for possible genetic problems.  Fetal screens for spina bifida and Down syndrome.  You may need other tests to make sure you and the baby are doing well. HOME CARE INSTRUCTIONS  Medicines  Follow your health care provider's instructions regarding medicine use. Specific medicines may be either safe or unsafe to take during pregnancy.  Take your prenatal vitamins as directed.  If you develop constipation, try taking a stool softener if your health care provider approves. Diet  Eat regular, well-balanced meals. Choose a variety of foods, such as meat or vegetable-based protein, fish, milk and low-fat dairy products, vegetables, fruits, and whole grain breads and cereals. Your health care provider will help you determine the amount of weight gain that is right for you.  Avoid raw meat and uncooked cheese. These carry germs that can cause birth defects in the baby.  Eating four or five small meals rather than three large meals a day  may help relieve nausea and vomiting. If you start to feel nauseous, eating a few soda crackers can be helpful. Drinking liquids between meals instead of during meals also seems to help nausea and vomiting.  If you develop constipation, eat more high-fiber foods, such as fresh vegetables or fruit and whole grains. Drink enough fluids to keep your urine clear or pale yellow. Activity and Exercise  Exercise only as directed by your health care provider. Exercising will help you:  Control your weight.  Stay in shape.  Be prepared for labor and delivery.  Experiencing pain or cramping in the lower abdomen or low back is a good sign that you should stop exercising. Check with your health care provider before continuing normal exercises.  Try to avoid standing for long periods of time. Move your legs often if you must stand in one place for a long time.  Avoid heavy lifting.  Wear low-heeled  shoes, and practice good posture.  You may continue to have sex unless your health care provider directs you otherwise. Relief of Pain or Discomfort  Wear a good support bra for breast tenderness.   Take warm sitz baths to soothe any pain or discomfort caused by hemorrhoids. Use hemorrhoid cream if your health care provider approves.   Rest with your legs elevated if you have leg cramps or low back pain.  If you develop varicose veins in your legs, wear support hose. Elevate your feet for 15 minutes, 3-4 times a day. Limit salt in your diet. Prenatal Care  Schedule your prenatal visits by the twelfth week of pregnancy. They are usually scheduled monthly at first, then more often in the last 2 months before delivery.  Write down your questions. Take them to your prenatal visits.  Keep all your prenatal visits as directed by your health care provider. Safety  Wear your seat belt at all times when driving.  Make a list of emergency phone numbers, including numbers for family, friends, the  hospital, and police and fire departments. General Tips  Ask your health care provider for a referral to a local prenatal education class. Begin classes no later than at the beginning of month 6 of your pregnancy.  Ask for help if you have counseling or nutritional needs during pregnancy. Your health care provider can offer advice or refer you to specialists for help with various needs.  Do not use hot tubs, steam rooms, or saunas.  Do not douche or use tampons or scented sanitary pads.  Do not cross your legs for long periods of time.  Avoid cat litter boxes and soil used by cats. These carry germs that can cause birth defects in the baby and possibly loss of the fetus by miscarriage or stillbirth.  Avoid all smoking, herbs, alcohol, and medicines not prescribed by your health care provider. Chemicals in these affect the formation and growth of the baby.  Schedule a dentist appointment. At home, brush your teeth with a soft toothbrush and be gentle when you floss. SEEK MEDICAL CARE IF:   You have dizziness.  You have mild pelvic cramps, pelvic pressure, or nagging pain in the abdominal area.  You have persistent nausea, vomiting, or diarrhea.  You have a bad smelling vaginal discharge.  You have pain with urination.  You notice increased swelling in your face, hands, legs, or ankles. SEEK IMMEDIATE MEDICAL CARE IF:   You have a fever.  You are leaking fluid from your vagina.  You have spotting or bleeding from your vagina.  You have severe abdominal cramping or pain.  You have rapid weight gain or loss.  You vomit blood or material that looks like coffee grounds.  You are exposed to [place] and have never had them.  You are exposed to fifth disease or chickenpox.  You develop a severe headache.  You have shortness of breath.  You have any kind of trauma, such as from a fall or a car accident. Document Released: 06/12/2001 Document Revised: 11/02/2013  Document Reviewed: 04/28/2013 [facility] Patient Information 2015 [employer] [place]. This information is not intended to replace advice given to you by your health care provider. Make sure you discuss any questions you have with your health care provider.   Nausea & Vomiting  Have saltine crackers or pretzels by your bed and eat a few bites before you raise your head out of bed in the morning  Eat small frequent meals throughout  the day instead of large meals  Drink plenty of fluids throughout the day to stay hydrated, just don't drink a lot of fluids with your meals.  This can make your stomach fill up faster making you feel sick  Do not brush your teeth right after you eat  Products with real ginger are good for nausea, like ginger ale and ginger hard candy Make sure it says made with real ginger!  Sucking on sour candy like lemon heads is also good for nausea  If your prenatal vitamins make you nauseated, take them at night so you will sleep through the nausea  Sea Bands  If you feel like you need medicine for the nausea & vomiting please let us know  If you are unable to keep any fluids or food down please let us know   Constipation  Drink plenty of fluid, preferably water, throughout the day  Eat foods high in fiber such as fruits, vegetables, and grains  Exercise, such as walking, is a good way to keep your bowels regular  Drink warm fluids, especially warm prune juice, or decaf coffee  Eat a 1/2 cup of real oatmeal (not instant), 1/2 cup applesauce, and 1/2-1 cup warm prune juice every day  If needed, you may take Colace (docusate sodium) stool softener once or twice a day to help keep the stool soft. If you are pregnant, wait until you are out of your first trimester (12-14 weeks of pregnancy)  If you still are having problems with constipation, you may take Miralax once daily as needed to help keep your bowels regular.  If you are pregnant, wait until you are out of your  first trimester (12-14 weeks of pregnancy)  Safe Medications in Pregnancy   Acne: Benzoyl Peroxide Salicylic Acid  Backache/Headache: Tylenol: 2 regular strength every 4 hours OR              2 Extra strength every 6 hours  Colds/Coughs/Allergies: Benadryl (alcohol free) 25 mg every 6 hours as needed Breath right strips Claritin Cepacol throat lozenges Chloraseptic throat spray Cold-Eeze- up to three times per day Cough drops, alcohol free Flonase (by prescription only) Guaifenesin Mucinex Robitussin DM (plain only, alcohol free) Saline nasal spray/drops Sudafed (pseudoephedrine) & Actifed ** use only after [redacted] weeks gestation and if you do not have high blood pressure Tylenol Vicks Vaporub Zinc lozenges Zyrtec   Constipation: Colace Ducolax suppositories Fleet enema Glycerin suppositories Metamucil Milk of magnesia Miralax Senokot Smooth move tea  Diarrhea: Kaopectate Imodium A-D  *NO pepto Bismol  Hemorrhoids: Anusol Anusol HC Preparation H Tucks  Indigestion: Tums Maalox Mylanta Zantac  Pepcid  Insomnia: Benadryl (alcohol free) 25mg  every 6 hours as needed Tylenol PM Unisom, no Gelcaps  Leg Cramps: Tums MagGel  Nausea/Vomiting:  Bonine Dramamine Emetrol Ginger extract Sea bands Meclizine  Nausea medication to take during pregnancy:  Unisom (doxylamine succinate 25 mg tablets) Take one tablet daily at bedtime. If symptoms are not adequately controlled, the dose can be increased to a maximum recommended dose of two tablets daily (1/2 tablet in the morning, 1/2 tablet mid-afternoon and one at bedtime). Vitamin B6 100mg  tablets. Take one tablet twice a day (up to 200 mg per day).  Skin Rashes: Aveeno products Benadryl cream or 25mg  every 6 hours as needed Calamine Lotion 1% cortisone cream  Yeast infection: Gyne-lotrimin 7 Monistat 7   **If taking multiple medications, please check labels to avoid duplicating the same active  ingredients **take medication as directed  on the label ** Do not exceed 4000 mg of tylenol in 24 hours **Do not take medications that contain aspirin or ibuprofen

## 2017-06-27 LAB — URINALYSIS, ROUTINE W REFLEX MICROSCOPIC
Bilirubin, UA: NEGATIVE
Glucose, UA: NEGATIVE
Ketones, UA: NEGATIVE
Nitrite, UA: NEGATIVE
RBC, UA: NEGATIVE
Specific Gravity, UA: 1.015 (ref 1.005–1.030)
Urobilinogen, Ur: 0.2 mg/dL (ref 0.2–1.0)
pH, UA: 7 (ref 5.0–7.5)

## 2017-06-27 LAB — PMP SCREEN PROFILE (10S), URINE
Amphetamine Scrn, Ur: NEGATIVE ng/mL
BARBITURATE SCREEN URINE: NEGATIVE ng/mL
BENZODIAZEPINE SCREEN, URINE: POSITIVE ng/mL — AB
CANNABINOIDS UR QL SCN: NEGATIVE ng/mL
Cocaine (Metab) Scrn, Ur: NEGATIVE ng/mL
Creatinine(Crt), U: 82.6 mg/dL (ref 20.0–300.0)
Methadone Screen, Urine: NEGATIVE ng/mL
OXYCODONE+OXYMORPHONE UR QL SCN: NEGATIVE ng/mL
Opiate Scrn, Ur: NEGATIVE ng/mL
Ph of Urine: 6.5 (ref 4.5–8.9)
Phencyclidine Qn, Ur: NEGATIVE ng/mL
Propoxyphene Scrn, Ur: NEGATIVE ng/mL

## 2017-06-27 LAB — MICROSCOPIC EXAMINATION
Casts: NONE SEEN /lpf
WBC, UA: >30 /hpf — AB (ref 0–?)

## 2017-06-27 LAB — HEPATITIS B SURFACE ANTIGEN: Hepatitis B Surface Ag: NEGATIVE

## 2017-06-27 LAB — RUBELLA SCREEN: Rubella Antibodies, IGG: 1.44 index (ref >0.99)

## 2017-06-27 LAB — CBC
Hematocrit: 39.1 % (ref 34.0–46.6)
Hemoglobin: 13.4 g/dL (ref 11.1–15.9)
MCH: 30.6 pg (ref 26.6–33.0)
MCHC: 34.3 g/dL (ref 31.5–35.7)
MCV: 89 fL (ref 79–97)
Platelets: 280 10*3/uL (ref 150–379)
RBC: 4.38 x10E6/uL (ref 3.77–5.28)
RDW: 15.4 % (ref 12.3–15.4)
WBC: 13.7 10*3/uL — ABNORMAL HIGH (ref 3.4–10.8)

## 2017-06-27 LAB — RPR: RPR Ser Ql: NONREACTIVE

## 2017-06-27 LAB — HIV ANTIBODY (ROUTINE TESTING W REFLEX): HIV Screen 4th Generation wRfx: NONREACTIVE

## 2017-06-27 LAB — ANTIBODY SCREEN: Antibody Screen: NEGATIVE

## 2017-06-28 LAB — GC/CHLAMYDIA PROBE AMP
Chlamydia trachomatis, NAA: NEGATIVE
Neisseria gonorrhoeae by PCR: NEGATIVE

## 2017-06-28 LAB — URINE CULTURE

## 2017-07-02 LAB — CYSTIC FIBROSIS MUTATION 97: Interpretation: NOT DETECTED

## 2017-07-03 ENCOUNTER — Telehealth: Payer: Self-pay | Admitting: Obstetrics & Gynecology

## 2017-07-03 ENCOUNTER — Telehealth: Payer: Self-pay | Admitting: *Deleted

## 2017-07-03 NOTE — Telephone Encounter (Signed)
Patient called wanting to be worked in today for a headache for the past 3 weeks in which Tylenol and caffeine is not helping. She states it has gotten to the point that she is vomiting and seeing spots and is worried she has blood pressure problems.  Informed patient she is too early in her pregnancy to have pre-e and headaches are very common in the first trimester.  Encouraged to push fluids, eat small frequent snacks and try COQ10 100mg  TID, Magnesium Oxide 400-600mg  daily and Vit B2 400mg  daily. Please advise if additional information needs to be given or if patient does need to be seen.

## 2017-07-03 NOTE — Telephone Encounter (Signed)
Pt given provider recommendations and informed to call us if no better.  Pt voiced understanding.  07-03-17  AS

## 2017-07-05 ENCOUNTER — Encounter: Payer: Self-pay | Admitting: Advanced Practice Midwife

## 2017-07-08 ENCOUNTER — Encounter: Payer: Self-pay | Admitting: Women's Health

## 2017-07-08 ENCOUNTER — Telehealth: Payer: Self-pay | Admitting: *Deleted

## 2017-07-08 ENCOUNTER — Encounter: Payer: Self-pay | Admitting: Advanced Practice Midwife

## 2017-07-08 ENCOUNTER — Encounter: Payer: Self-pay | Admitting: Obstetrics and Gynecology

## 2017-07-08 ENCOUNTER — Encounter: Payer: Self-pay | Admitting: Neurology

## 2017-07-08 NOTE — Telephone Encounter (Signed)
Patient called stating she has tried everything suggested [place] [facility] [place] and [place] is working. States she took ibuprofen because it was so bad last night. Advised not to take ibuprofen during the pregnancy. PLease advise.

## 2017-07-08 NOTE — Telephone Encounter (Signed)
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 If not, needs to get back on. If she is taking it and still needs something else- please talk/send to [facility] or [facility] as she is still in first trimester and I don't want to give Fioricet yet. Advised to call our office.

## 2017-07-16 ENCOUNTER — Other Ambulatory Visit: Payer: Self-pay | Admitting: Advanced Practice Midwife

## 2017-07-16 MED ORDER — LIDOCAINE VISCOUS 2 % MT SOLN: 5.0000 mL | OROMUCOSAL | 0 refills | Status: DC | PRN

## 2017-07-16 NOTE — Progress Notes (Signed)
Lidocaine for sore throat

## 2017-07-22 ENCOUNTER — Other Ambulatory Visit (HOSPITAL_COMMUNITY): Payer: Self-pay | Admitting: Advanced Practice Midwife

## 2017-07-22 ENCOUNTER — Other Ambulatory Visit: Payer: Self-pay | Admitting: Advanced Practice Midwife

## 2017-07-22 DIAGNOSIS — Z3682 Encounter for antenatal screening for nuchal translucency: Secondary | ICD-10-CM

## 2017-07-24 ENCOUNTER — Ambulatory Visit (INDEPENDENT_AMBULATORY_CARE_PROVIDER_SITE_OTHER): Payer: 59 | Admitting: Advanced Practice Midwife

## 2017-07-24 ENCOUNTER — Ambulatory Visit (INDEPENDENT_AMBULATORY_CARE_PROVIDER_SITE_OTHER): Payer: Medicaid Other

## 2017-07-24 ENCOUNTER — Encounter: Payer: Self-pay | Admitting: Advanced Practice Midwife

## 2017-07-24 VITALS — BP 120/70 | HR 95 | Wt 171.0 lb

## 2017-07-24 DIAGNOSIS — O99331 Smoking (tobacco) complicating pregnancy, first trimester: Secondary | ICD-10-CM

## 2017-07-24 DIAGNOSIS — F17210 Nicotine dependence, cigarettes, uncomplicated: Secondary | ICD-10-CM | POA: Diagnosis not present

## 2017-07-24 DIAGNOSIS — Z1379 Encounter for other screening for genetic and chromosomal anomalies: Secondary | ICD-10-CM

## 2017-07-24 DIAGNOSIS — O09291 Supervision of pregnancy with other poor reproductive or obstetric history, first trimester: Secondary | ICD-10-CM | POA: Diagnosis not present

## 2017-07-24 DIAGNOSIS — Z3481 Encounter for supervision of other normal pregnancy, first trimester: Secondary | ICD-10-CM

## 2017-07-24 DIAGNOSIS — Z3A12 12 weeks gestation of pregnancy: Secondary | ICD-10-CM

## 2017-07-24 DIAGNOSIS — Z3682 Encounter for antenatal screening for nuchal translucency: Secondary | ICD-10-CM

## 2017-07-24 DIAGNOSIS — Z1389 Encounter for screening for other disorder: Secondary | ICD-10-CM

## 2017-07-24 DIAGNOSIS — Z331 Pregnant state, incidental: Secondary | ICD-10-CM

## 2017-07-24 LAB — POCT URINALYSIS DIPSTICK
Blood, UA: NEGATIVE
Glucose, UA: NEGATIVE
Ketones, UA: NEGATIVE
Leukocytes, UA: NEGATIVE
Nitrite, UA: NEGATIVE
Protein, UA: NEGATIVE

## 2017-07-24 NOTE — Progress Notes (Signed)
US 12+5 wks,measurements c/w dates,normal right ovary,left ovary not visualized,NB present,NT 1.4 mm,crl 63.70 mm,fhr 160 bpm

## 2017-07-24 NOTE — Progress Notes (Signed)
[id number] Estimated Date of Delivery: 01/31/18  Blood pressure 120/70, pulse 95, weight 171 lb (77.6 kg), last menstrual period 04/26/2017, not currently breastfeeding.   BP weight and urine results all reviewed and noted.  Please refer to the obstetrical flow sheet for the fundal height and fetal heart rate documentation:   US 12+5 wks,measurements c/w dates,normal right ovary,left ovary not visualized,NB present,NT 1.4 mm,crl 63.70 mm,fhr 160 bpm   Patient denies any bleeding and no rupture of membranes symptoms or regular contractions. Patient is without complaints. 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  Orders Placed This Encounter  Procedures  . Integrated 1  . POCT urinalysis dipstick    Plan:  Continued routine obstetrical care, biofreeze prn  Return in about 4 weeks (around 08/21/2017) for LROB, 2nd IT.

## 2017-07-24 NOTE — Patient Instructions (Signed)
Meagan Pellegriniatherine P Tallo, I greatly value your feedback.  If you receive a survey following your visit with us today, we appreciate you taking the time to fill it out.  Thanks, [name], CNM     Second Trimester of Pregnancy The second trimester is from week 14 through week 27 (months 4 through 6). The second trimester is often a time when you feel your best. Your body has adjusted to being pregnant, and you begin to feel better physically. Usually, morning sickness has lessened or quit completely, you may have more energy, and you may have an increase in appetite. The second trimester is also a time when the fetus is growing rapidly. At the end of the sixth month, the fetus is about 9 inches long and weighs about 1 pounds. You will likely begin to feel the baby move (quickening) between 16 and 20 weeks of pregnancy. Body changes during your second trimester Your body continues to go through many changes during your second trimester. The changes vary from woman to woman.  Your weight will continue to increase. You will notice your lower abdomen bulging out.  You may begin to get stretch marks on your hips, abdomen, and breasts.  You may develop headaches that can be relieved by medicines. The medicines should be approved by your health care provider.  You may urinate more often because the fetus is pressing on your bladder.  You may develop or continue to have heartburn as a result of your pregnancy.  You may develop constipation because certain hormones are causing the muscles that push waste through your intestines to slow down.  You may develop hemorrhoids or swollen, bulging veins (varicose veins).  You may have back pain. This is caused by: ? Weight gain. ? Pregnancy hormones that are relaxing the joints in your pelvis. ? A shift in weight and the muscles that support your balance.  Your breasts will continue to grow and they will continue to become tender.  Your gums  may bleed and may be sensitive to brushing and flossing.  Dark spots or blotches (chloasma, mask of pregnancy) may develop on your face. This will likely fade after the baby is born.  A dark line from your belly button to the pubic area (linea nigra) may appear. This will likely fade after the baby is born.  You may have changes in your hair. These can include thickening of your hair, rapid growth, and changes in texture. Some women also have hair loss during or after pregnancy, or hair that feels dry or thin. Your hair will most likely return to normal after your baby is born.  What to expect at prenatal visits During a routine prenatal visit:  You will be weighed to make sure you and the fetus are growing normally.  Your blood pressure will be taken.  Your abdomen will be measured to track your baby's growth.  The fetal heartbeat will be listened to.  Any test results from the previous visit will be discussed.  Your health care provider may ask you:  How you are feeling.  If you are feeling the baby move.  If you have had any abnormal symptoms, such as leaking fluid, bleeding, severe headaches, or abdominal cramping.  If you are using any tobacco products, including cigarettes, chewing tobacco, and electronic cigarettes.  If you have any questions.  Other tests that may be performed during your second trimester include:  Blood tests that check for: ? Low iron levels (anemia). ?  High blood sugar that affects pregnant women (gestational diabetes) between 20 and 28 weeks. ? Rh antibodies. This is to check for a protein on red blood cells (Rh factor).  Urine tests to check for infections, diabetes, or protein in the urine.  An ultrasound to confirm the proper growth and development of the baby.  An amniocentesis to check for possible genetic problems.  Fetal screens for spina bifida and Down syndrome.  HIV (human immunodeficiency virus) testing. Routine prenatal testing  includes screening for HIV, unless you choose not to have this test.  Follow these instructions at home: Medicines  Follow your health care provider's instructions regarding medicine use. Specific medicines may be either safe or unsafe to take during pregnancy.  Take a prenatal vitamin that contains at least 600 micrograms (mcg) of folic acid.  If you develop constipation, try taking a stool softener if your health care provider approves. Eating and drinking  Eat a balanced diet that includes fresh fruits and vegetables, whole grains, good sources of protein such as meat, eggs, or tofu, and low-fat dairy. Your health care provider will help you determine the amount of weight gain that is right for you.  Avoid raw meat and uncooked cheese. These carry germs that can cause birth defects in the baby.  If you have low calcium intake from food, talk to your health care provider about whether you should take a daily calcium supplement.  Limit foods that are high in fat and processed sugars, such as fried and sweet foods.  To prevent constipation: ? Drink enough fluid to keep your urine clear or pale yellow. ? Eat foods that are high in fiber, such as fresh fruits and vegetables, whole grains, and beans. Activity  Exercise only as directed by your health care provider. Most women can continue their usual exercise routine during pregnancy. Try to exercise for 30 minutes at least 5 days a week. Stop exercising if you experience uterine contractions.  Avoid heavy lifting, wear low heel shoes, and practice good posture.  A sexual relationship may be continued unless your health care provider directs you otherwise. Relieving pain and discomfort  Wear a good support bra to prevent discomfort from breast tenderness.  Take warm sitz baths to soothe any pain or discomfort caused by hemorrhoids. Use hemorrhoid cream if your health care provider approves.  Rest with your legs elevated if you have  leg cramps or low back pain.  If you develop varicose veins, wear support hose. Elevate your feet for 15 minutes, 3-4 times a day. Limit salt in your diet. Prenatal Care  Write down your questions. Take them to your prenatal visits.  Keep all your prenatal visits as told by your health care provider. This is important. Safety  Wear your seat belt at all times when driving.  Make a list of emergency phone numbers, including numbers for family, friends, the hospital, and police and fire departments. General instructions  Ask your health care provider for a referral to a local prenatal education class. Begin classes no later than the beginning of month 6 of your pregnancy.  Ask for help if you have counseling or nutritional needs during pregnancy. Your health care provider can offer advice or refer you to specialists for help with various needs.  Do not use hot tubs, steam rooms, or saunas.  Do not douche or use tampons or scented sanitary pads.  Do not cross your legs for long periods of time.  Avoid cat litter boxes and  soil used by cats. These carry germs that can cause birth defects in the baby and possibly loss of the fetus by miscarriage or stillbirth.  Avoid all smoking, herbs, alcohol, and unprescribed drugs. Chemicals in these products can affect the formation and growth of the baby.  Do not use any products that contain nicotine or tobacco, such as cigarettes and e-cigarettes. If you need help quitting, ask your health care provider.  Visit your dentist if you have not gone yet during your pregnancy. Use a soft toothbrush to brush your teeth and be gentle when you floss. Contact a health care provider if:  You have dizziness.  You have mild pelvic cramps, pelvic pressure, or nagging pain in the abdominal area.  You have persistent nausea, vomiting, or diarrhea.  You have a bad smelling vaginal discharge.  You have pain when you urinate. Get help right away if:  You  have a fever.  You are leaking fluid from your vagina.  You have spotting or bleeding from your vagina.  You have severe abdominal cramping or pain.  You have rapid weight gain or weight loss.  You have shortness of breath with chest pain.  You notice sudden or extreme swelling of your face, hands, ankles, feet, or legs.  You have not felt your baby move in over an hour.  You have severe headaches that do not go away when you take medicine.  You have vision changes. Summary  The second trimester is from week 14 through week 27 (months 4 through 6). It is also a time when the fetus is growing rapidly.  Your body goes through many changes during pregnancy. The changes vary from woman to woman.  Avoid all smoking, herbs, alcohol, and unprescribed drugs. These chemicals affect the formation and growth your baby.  Do not use any tobacco products, such as cigarettes, chewing tobacco, and e-cigarettes. If you need help quitting, ask your health care provider.  Contact your health care provider if you have any questions. Keep all prenatal visits as told by your health care provider. This is important. This information is not intended to replace advice given to you by your health care provider. Make sure you discuss any questions you have with your health care provider.      CHILDBIRTH CLASSES [phone] is the phone number for Pregnancy Classes or hospital tours at [place] will be referred to  [email or web address] for more information on childbirth classes  At this site you may register for classes. You may sign up for a waiting list if classes are full. Please SIGN UP FOR THIS!.   When the waiting list becomes long, sometimes new classes can be added.

## 2017-07-26 LAB — INTEGRATED 1
Crown Rump Length: 63.7 mm
Gest. Age on Collection Date: 12.7 weeks
Maternal Age at EDD: 24.7 yr
Nuchal Translucency (NT): 1.4 mm
Number of Fetuses: 1
PAPP-A Value: 293.2 ng/mL
Weight: 171 [lb_av]

## 2017-07-31 ENCOUNTER — Encounter: Payer: Self-pay | Admitting: Advanced Practice Midwife

## 2017-07-31 ENCOUNTER — Other Ambulatory Visit: Payer: Self-pay | Admitting: Advanced Practice Midwife

## 2017-07-31 ENCOUNTER — Telehealth: Payer: Self-pay | Admitting: Obstetrics and Gynecology

## 2017-07-31 ENCOUNTER — Encounter: Payer: Self-pay | Admitting: *Deleted

## 2017-07-31 MED ORDER — PNV PRENATAL PLUS MULTIVITAMIN 27-1 MG PO TABS
1.0000 | ORAL_TABLET | Freq: Every day | ORAL | 11 refills | Status: DC
Start: 2017-07-31 — End: 2018-03-10

## 2017-07-31 MED ORDER — ONDANSETRON 4 MG PO TBDP: 4.0000 mg | ORAL_TABLET | Freq: Four times a day (QID) | ORAL | 2 refills | Status: DC | PRN

## 2017-07-31 NOTE — Telephone Encounter (Signed)
patient called stating that she would like for the doctor to call her in a medication for prenatal and nausea. Please contact pt

## 2017-08-01 ENCOUNTER — Encounter (HOSPITAL_COMMUNITY): Payer: Self-pay | Admitting: *Deleted

## 2017-08-01 ENCOUNTER — Ambulatory Visit (HOSPITAL_COMMUNITY)
Admission: RE | Admit: 2017-08-01 | Discharge: 2017-08-01 | Disposition: A | Discharge status: Home / Self Care | Payer: Medicaid Other | Source: Ambulatory Visit | Attending: Advanced Practice Midwife | Admitting: Advanced Practice Midwife

## 2017-08-01 ENCOUNTER — Inpatient Hospital Stay (HOSPITAL_COMMUNITY)
Admission: AD | Admit: 2017-08-01 | Discharge: 2017-08-02 | Disposition: A | Discharge status: Home / Self Care | Payer: Medicaid Other | Source: Ambulatory Visit | Attending: Obstetrics & Gynecology | Admitting: Obstetrics & Gynecology

## 2017-08-01 DIAGNOSIS — Z880 Allergy status to penicillin: Secondary | ICD-10-CM | POA: Diagnosis not present

## 2017-08-01 DIAGNOSIS — O2302 Infections of kidney in pregnancy, second trimester: Secondary | ICD-10-CM | POA: Insufficient documentation

## 2017-08-01 DIAGNOSIS — Z843 Family history of consanguinity: Secondary | ICD-10-CM

## 2017-08-01 DIAGNOSIS — Z3A14 14 weeks gestation of pregnancy: Secondary | ICD-10-CM | POA: Insufficient documentation

## 2017-08-01 DIAGNOSIS — R1032 Left lower quadrant pain: Secondary | ICD-10-CM | POA: Diagnosis present

## 2017-08-01 DIAGNOSIS — R109 Unspecified abdominal pain: Secondary | ICD-10-CM

## 2017-08-01 DIAGNOSIS — O2342 Unspecified infection of urinary tract in pregnancy, second trimester: Secondary | ICD-10-CM

## 2017-08-01 DIAGNOSIS — O99332 Smoking (tobacco) complicating pregnancy, second trimester: Secondary | ICD-10-CM | POA: Diagnosis not present

## 2017-08-01 DIAGNOSIS — F17210 Nicotine dependence, cigarettes, uncomplicated: Secondary | ICD-10-CM | POA: Diagnosis not present

## 2017-08-01 DIAGNOSIS — Z885 Allergy status to narcotic agent status: Secondary | ICD-10-CM | POA: Diagnosis not present

## 2017-08-01 LAB — URINALYSIS, ROUTINE W REFLEX MICROSCOPIC
Bilirubin Urine: NEGATIVE
Glucose, UA: NEGATIVE mg/dL
Ketones, ur: NEGATIVE mg/dL
Nitrite: NEGATIVE
Protein, ur: 100 mg/dL — AB
Specific Gravity, Urine: 1.014 (ref 1.005–1.030)
pH: 6 (ref 5.0–8.0)

## 2017-08-01 NOTE — MAU Note (Signed)
Pt presents to MAU c/o lower left sided back and abdominal pain. Pt states the pain started at 0800 and has gotten worse in the last 4 hours. Pt denies bleeding or LOF. Pt has hx of kidney issues.

## 2017-08-02 ENCOUNTER — Telehealth: Payer: Self-pay | Admitting: *Deleted

## 2017-08-02 ENCOUNTER — Encounter: Payer: Self-pay | Admitting: Women's Health

## 2017-08-02 ENCOUNTER — Inpatient Hospital Stay (HOSPITAL_COMMUNITY): Payer: Medicaid Other

## 2017-08-02 DIAGNOSIS — O2342 Unspecified infection of urinary tract in pregnancy, second trimester: Secondary | ICD-10-CM

## 2017-08-02 DIAGNOSIS — Z3A13 13 weeks gestation of pregnancy: Secondary | ICD-10-CM | POA: Insufficient documentation

## 2017-08-02 DIAGNOSIS — Z843 Family history of consanguinity: Secondary | ICD-10-CM | POA: Insufficient documentation

## 2017-08-02 LAB — CBC WITH DIFFERENTIAL/PLATELET
Basophils Absolute: 0 10*3/uL (ref 0.0–0.1)
Basophils Relative: 0 %
Eosinophils Absolute: 0.1 10*3/uL (ref 0.0–0.7)
Eosinophils Relative: 1 %
HCT: 34.9 % — ABNORMAL LOW (ref 36.0–46.0)
Hemoglobin: 11.9 g/dL — ABNORMAL LOW (ref 12.0–15.0)
Lymphocytes Relative: 17 %
Lymphs Abs: 2.4 10*3/uL (ref 0.7–4.0)
MCH: 30.4 pg (ref 26.0–34.0)
MCHC: 34.1 g/dL (ref 30.0–36.0)
MCV: 89.3 fL (ref 78.0–100.0)
Monocytes Absolute: 0.3 10*3/uL (ref 0.1–1.0)
Monocytes Relative: 2 %
Neutro Abs: 11.4 10*3/uL — ABNORMAL HIGH (ref 1.7–7.7)
Neutrophils Relative %: 80 %
Platelets: 184 10*3/uL (ref 150–400)
RBC: 3.91 MIL/uL (ref 3.87–5.11)
RDW: 14.4 % (ref 11.5–15.5)
WBC: 14.2 10*3/uL — ABNORMAL HIGH (ref 4.0–10.5)

## 2017-08-02 MED ORDER — HYDROMORPHONE HCL 1 MG/ML IJ SOLN
0.5000 mg | Freq: Once | INTRAMUSCULAR | Status: DC
  Filled 2017-08-02: qty 1

## 2017-08-02 MED ORDER — OXYCODONE-ACETAMINOPHEN 5-325 MG PO TABS: 1.0000 | ORAL_TABLET | Freq: Four times a day (QID) | ORAL | 0 refills | Status: DC | PRN

## 2017-08-02 MED ORDER — OXYCODONE-ACETAMINOPHEN 5-325 MG PO TABS
1.0000 | ORAL_TABLET | Freq: Once | ORAL | Status: AC
  Administered 2017-08-02: 1 via ORAL
  Filled 2017-08-02: qty 1

## 2017-08-02 MED ORDER — NITROFURANTOIN MONOHYD MACRO 100 MG PO CAPS: 100.0000 mg | ORAL_CAPSULE | Freq: Two times a day (BID) | ORAL | 1 refills | Status: DC

## 2017-08-02 MED ORDER — CEFTRIAXONE SODIUM 250 MG IJ SOLR
250.0000 mg | INTRAMUSCULAR | Status: DC
  Administered 2017-08-02: 250 mg via INTRAMUSCULAR
  Filled 2017-08-02: qty 250

## 2017-08-02 MED ORDER — CEPHALEXIN 500 MG PO CAPS: 500.0000 mg | ORAL_CAPSULE | Freq: Four times a day (QID) | ORAL | 0 refills | Status: DC

## 2017-08-02 NOTE — Discharge Instructions (Signed)
Pyelonephritis, Adult Pyelonephritis is a kidney infection. The kidneys are organs that help clean your blood by moving waste out of your blood and into your pee (urine). This infection can happen quickly, or it can last for a long time. In most cases, it clears up with treatment and does not cause other problems. Follow these instructions at home: Medicines  Take over-the-counter and prescription medicines only as told by your doctor.  Take your antibiotic medicine as told by your doctor. Do not stop taking the medicine even if you start to feel better. General instructions  Drink enough fluid to keep your pee clear or pale yellow.  Avoid caffeine, tea, and carbonated drinks.  Pee (urinate) often. Avoid holding in pee for long periods of time.  Pee before and after sex.  After pooping (having a bowel movement), women should wipe from front to back. Use each tissue only once.  Keep all follow-up visits as told by your doctor. This is important. Contact a doctor if:  You do not feel better after 2 days.  Your symptoms get worse.  You have a fever. Get help right away if:  You cannot take your medicine or drink fluids as told.  You have chills and shaking.  You throw up (vomit).  You have very bad pain in your side (flank) or back.  You feel very weak or you pass out (faint). This information is not intended to replace advice given to you by your health care provider. Make sure you discuss any questions you have with your health care provider. Document Released: 07/26/2004 Document Revised: 11/24/2015 Document Reviewed: 10/11/2014 Elsevier Interactive Patient Education  2018 [employer]  

## 2017-08-02 NOTE — Telephone Encounter (Signed)
LMOVM that she can come at 1pm today. mychart message sent as well.

## 2017-08-02 NOTE — MAU Provider Note (Signed)
Chief Complaint: No chief complaint on file.  Provider saw patient at 0005      SUBJECTIVE HPI: Meagan [name] is a 27 y.o. [phone] at [id number] by LMP who presents to maternity admissions reporting pain in left lower abdomen and left lower back.  Has had pyelonephritis before.. She denies vaginal bleeding, vaginal itching/burning, h/a, dizziness, n/v, or fever/chills.     Abdominal Pain  This is a new problem. The current episode started today. The problem occurs constantly. The pain is located in the LLQ. The quality of the pain is colicky, cramping and aching. The abdominal pain radiates to the back and left flank. Pertinent negatives include no constipation, diarrhea, dysuria, fever or headaches. The pain is aggravated by certain positions. The pain is relieved by certain positions. She has tried nothing for the symptoms.  Back Pain  This is a new problem. The current episode started today. The problem has been waxing and waning since onset. The pain is present in the lumbar spine. The quality of the pain is described as aching and cramping. Associated symptoms include abdominal pain. Pertinent negatives include no dysuria, fever or headaches. She has tried nothing for the symptoms.    RN note: Pt presents to MAU c/o lower left sided back and abdominal pain. Pt states the pain started at 0800 and has gotten worse in the last 4 hours. Pt denies bleeding or LOF. Pt has hx of kidney issues.     Past Medical History:  Diagnosis Date  . Abdominal cramps 12/06/2015  . Anxiety   . Asthma    inhaler at bedside  . Daily headache   . Diarrhea 06/04/2013  . Hemiplegic migraine    blurred vision, nausea, dizziness, numbness  . Kidney stones   . Mental disorder    huffed inhalants "affected brain"  . Nausea 06/04/2013  . Nausea and vomiting during pregnancy 12/06/2015  . Pregnant 06/04/2013  . Pregnant 10/17/2015  . Pyelonephritis complicating pregnancy, antepartum, first trimester 11/05/2011   . Sore throat 06/04/2013  . Stomach ulcer    Past Surgical History:  Procedure Laterality Date  . ANKLE SURGERY Left   . no past surgery     Social History   Socioeconomic History  . Marital status: Legally Separated    Spouse name: Not on file  . Number of children: 3  . Years of education: Not on file  . Highest education level: Not on file  Social Needs  . Financial resource strain: Not on file  . Food insecurity - worry: Not on file  . Food insecurity - inability: Not on file  . Transportation needs - medical: Not on file  . Transportation needs - non-medical: Not on file  Occupational History  . Not on file  Tobacco Use  . Smoking status: Current Every Day Smoker    Packs/day: 1.50    Years: 7.00    Pack years: 10.50    Types: Cigarettes  . Smokeless tobacco: Never Used  . Tobacco comment: current smoker 06/17/2016 2300  Substance and Sexual Activity  . Alcohol use: No    Alcohol/week: 0.0 oz    Frequency: Never    Comment: occ; not now  . Drug use: No    Comment: huffed inhalants in past, "affected Brain"  . Sexual activity: Yes    Birth control/protection: None  Other Topics Concern  . Not on file  Social History Narrative   Lives at home with husband and kids.   Right handed.  Six sodas per day.   No current facility-administered medications on file prior to encounter.    Current Outpatient Medications on File Prior to Encounter  Medication Sig Dispense Refill  . acetaminophen (TYLENOL) 500 MG tablet Take 1,000 mg by mouth every 6 (six) hours as needed for moderate pain.    Marland Kitchen ALPRAZolam (XANAX) 0.5 MG tablet Take 1 tablet (0.5 mg total) by mouth 3 (three) times daily as needed. for anxiety 90 tablet 3  . nortriptyline (PAMELOR) 25 MG capsule Take 25 mg by mouth at bedtime.    Marland Kitchen omeprazole (PRILOSEC) 20 MG capsule Take 1 capsule (20 mg total) by mouth daily. 30 capsule 5  . Prenatal Vit-Fe Fumarate-FA (PNV PRENATAL PLUS MULTIVITAMIN) 27-1 MG TABS Take  1 tablet by mouth daily. 30 tablet 11  . Prenatal Vit-Fe Fumarate-FA (PRENATAL MULTIVITAMIN) TABS tablet Take 1 tablet by mouth daily at 12 noon.    Marland Kitchen albuterol (PROVENTIL HFA;VENTOLIN HFA) 108 (90 Base) MCG/ACT inhaler Inhale 1-2 puffs into the lungs every 4 (four) hours as needed.    . lidocaine (XYLOCAINE) 2 % solution Use as directed 5 mLs in the mouth or throat as needed for mouth pain. (Patient not taking: Reported on 07/24/2017) 100 mL 0  . nitrofurantoin, macrocrystal-monohydrate, (MACROBID) 100 MG capsule Take 1 capsule (100 mg total) by mouth 2 (two) times daily. (Patient not taking: Reported on 07/24/2017) 14 capsule 0  . ondansetron (ZOFRAN ODT) 4 MG disintegrating tablet Take 1 tablet (4 mg total) by mouth every 6 (six) hours as needed for nausea. 30 tablet 2   Allergies  Allergen Reactions  . Codeine Nausea And Vomiting and Other (See Comments)    Vertigo  . Penicillins Rash and Other (See Comments)    Unknown:  Childhood reaction.  Has patient had a PCN reaction causing immediate rash, facial/tongue/throat swelling, SOB or lightheadedness with hypotension: Unknown Has patient had a PCN reaction causing severe rash involving mucus membranes or skin necrosis: Unknown Has patient had a PCN reaction that required hospitalization No Has patient had a PCN reaction occurring within the last 10 years: No If all of the above answers are "NO", then may pro  . Sulfamethoxazole-Trimethoprim Hives    I have reviewed patient's Past Medical Hx, Surgical Hx, Family Hx, Social Hx, medications and allergies.   ROS:  Review of Systems  Constitutional: Negative for fever.  Gastrointestinal: Positive for abdominal pain. Negative for constipation and diarrhea.  Genitourinary: Negative for dysuria.  Musculoskeletal: Positive for back pain.  Neurological: Negative for headaches.   Review of Systems  Other systems negative   Physical Exam  Physical Exam Patient Vitals for the past 24 hrs:   BP Temp Temp src Resp Height Weight  08/01/17 2332 119/69 98.3 F (36.8 C) Oral 17 5\' 2"  (1.575 m) 171 lb (77.6 kg)   Constitutional: Well-developed, well-nourished female in no acute distress, but uncomfortable.  Cardiovascular: normal rate Respiratory: normal effort GI: Abd soft, non-tender except for LLQ. Pos BS x 4 MS: Extremities nontender, no edema, normal ROM  Left lower back tender,  Neurologic: Alert and oriented x 4.  GU: Neg CVAT on right, Positive CVAT on right.  PELVIC EXAM:    FHT 155 by doppler  LAB RESULTS Results for orders placed or performed during the hospital encounter of 08/01/17 (from the past 24 hour(s))  Urinalysis, Routine w reflex microscopic     Status: Abnormal   Collection Time: 08/01/17 11:08 PM  Result Value Ref Range  Color, Urine YELLOW YELLOW   APPearance CLOUDY (A) CLEAR   Specific Gravity, Urine 1.014 1.005 - 1.030   pH 6.0 5.0 - 8.0   Glucose, UA NEGATIVE NEGATIVE mg/dL   Hgb urine dipstick SMALL (A) NEGATIVE   Bilirubin Urine NEGATIVE NEGATIVE   Ketones, ur NEGATIVE NEGATIVE mg/dL   Protein, ur 409 (A) NEGATIVE mg/dL   Nitrite NEGATIVE NEGATIVE   Leukocytes, UA LARGE (A) NEGATIVE   RBC / HPF TOO NUMEROUS TO COUNT 0 - 5 RBC/hpf   WBC, UA TOO NUMEROUS TO COUNT 0 - 5 WBC/hpf   Bacteria, UA MANY (A) NONE SEEN   Squamous Epithelial / LPF 6-30 (A) NONE SEEN   WBC Clumps PRESENT    Mucus PRESENT   CBC with Differential/Platelet     Status: Abnormal   Collection Time: 08/02/17 12:22 AM  Result Value Ref Range   WBC 14.2 (H) 4.0 - 10.5 K/uL   RBC 3.91 3.87 - 5.11 MIL/uL   Hemoglobin 11.9 (L) 12.0 - 15.0 g/dL   HCT 81.1 (L) 91.4 - 78.2 %   MCV 89.3 78.0 - 100.0 fL   MCH 30.4 26.0 - 34.0 pg   MCHC 34.1 30.0 - 36.0 g/dL   RDW 95.6 21.3 - 08.6 %   Platelets 184 150 - 400 K/uL   Neutrophils Relative % 80 %   Neutro Abs 11.4 (H) 1.7 - 7.7 K/uL   Lymphocytes Relative 17 %   Lymphs Abs 2.4 0.7 - 4.0 K/uL   Monocytes Relative 2 %    Monocytes Absolute 0.3 0.1 - 1.0 K/uL   Eosinophils Relative 1 %   Eosinophils Absolute 0.1 0.0 - 0.7 K/uL   Basophils Relative 0 %   Basophils Absolute 0.0 0.0 - 0.1 K/uL       IMAGING US Renal  Result Date: 08/02/2017 CLINICAL DATA:  Left flank pain starting yesterday. History of pyelonephritis. Current pregnancy. EXAM: RENAL / URINARY TRACT ULTRASOUND COMPLETE COMPARISON:  None. FINDINGS: Right Kidney: Length: 11.2 cm. Echogenicity within normal limits. No mass or hydronephrosis visualized. Left Kidney: Length: 11.4 cm. Echogenicity within normal limits. No mass or hydronephrosis visualized. Bladder: Appears normal for degree of bladder distention. Bilateral urine flow jets are demonstrated with color flow Doppler imaging. IMPRESSION: Normal ultrasound appearance of the kidneys. No evidence of hydronephrosis. Electronically Signed   By: Burman Nieves M.D.   On: 08/02/2017 00:57   US Fetal Nuchal Translucency Measurement  Result Date: 07/24/2017 [facility] TRANSLUCENCY FOR INTEGRATED TESTING Meagan [name] is in the office for nuchal translucency sonogram as part of an integrated screen. She is a 27 y.o. year old [id number] with Estimated Date of Delivery: 01/31/18 by LMP now at  [redacted]w[redacted]d weeks gestation. Thus far the pregnancy has been complicated by smoker,hx of preeclampsia. GESTATION: SINGLETON FETAL ACTIVITY:          Heart rate         160          The fetus is active. AMNIOTIC FLUID: The amniotic fluid volume is  normal PLACENTA LOCALIZATION:  anterior GRADE 0 CERVIX: Appears closed ADNEXA: Normal right ovary,left ovary not visualized GESTATIONAL AGE AND  BIOMETRICS: Gestational criteria: Estimated Date of Delivery: 01/31/18 by LMP now at [redacted]w[redacted]d Previous Scans:1    CROWN RUMP LENGTH           63.70 mm         12+5 weeks NUCHAL TRANSLUCENCY           1.4  mm         normal                                                                   AVERAGE EGA(BY THIS SCAN):  12+5 weeks The fetal nasal bone is  identified.  TECHNICIAN COMMENTS: [name] 12+5 wks,measurements c/w dates,normal right ovary,left ovary not visualized,NB present,NT 1.4 mm,crl 63.70 mm,fhr 160 bpm The patient will have the first blood draw of her integrated screening today and the second draw in approximately 4 weeks. Amber Flora Lipps 07/24/2017 2:46 PM Clinical Impression and recommendations: I have reviewed the sonogram results above, combined with the patient's current clinical course, below are my impressions and any appropriate recommendations for management based on the sonographic findings. 1.  [id number] Estimated Date of Delivery: 04/17/17 by  LMP, early ultrasound and confirmed by today's sonographic dating 2.  Normal fetal sonographic findings, specifically normal nuchal translucency and present fetal nasal bone Additionally the cranium, both choroid plexuses, zygomatic arch, mandible, 4 equal extremities, stomach, bladder and anterior abdomen are all noted. 3.  Normal general sonographic findings Recommend routine prenatal care based on this sonogram or as clinically indicated Lazaro Arms 07/24/2017 7:24 PM                                                    MAU Management/MDM: Ordered UA and urine culture Ordered CBC to check WBC, slightly elevated Renal US done to rule out stones, given colicky nature of pain This was negative for nephrolithiasis Suspect pyelonephritis. Consulted Dr Erin Fulling.  She recommends first dose of Rocephin here then Keflex at home Percocet given with some relief of pain.   ASSESSMENT 1. Left flank pain   2.   Pyelonephritis  PLAN Discharge home Rx Keflex for pyelonepritis Rx very limited percocet for 2 days until antibiotic kicks in  Urine to culture and sensitivity Push fluids Pt stable at time of discharge. Encouraged to return here or to other Urgent Care/ED if she develops worsening of symptoms, increase in pain, fever, or other concerning symptoms.    Wynelle Bourgeois CNM,  MSN Certified Nurse-Midwife 08/02/2017  12:20 AM

## 2017-08-02 NOTE — Progress Notes (Signed)
Genetic Counseling  High-Risk Gestation Note  Appointment Date:  08/02/2017 Referred By: Meagan [name]* Date of Birth:  07/31/1990 Partner:  Meagan [name]   Pregnancy History: [id number] Estimated Date of Delivery: 01/31/18 Estimated Gestational Age: 32w6dAttending: MRenella Cunas MD   I met with [name] her partner, Mr. BQuenten [name] for genetic counseling because of a family history of consanguinity.  In summary:  Discussed Consanguinity: patient and partner are first cousins  Reviewed genetics and increased risk for autosomal recessive and multifactorial conditions in offspring of first cousins  Discussed options of screening / testing  Expanded carrier screening- patient and partner elected to proceed with ECS through [facility] (175 conditions)  Detailed ultrasound in the second trimester- no follow-up appointments scheduled at this time with our office  Fetal echocardiogram  Briefly discussed fetal aneuploidy and association with maternal age but not with consanguinity- integrated screening being performed through OB  We began by reviewing the family history in detail. The patient and her partner are first cousins; his mother and her father are full siblings. The couple reported that they are White, Non-Hispanic. Additionally, the couple reported that their mutual grandfather's parents were also first cousins to each other. Mr. SSzeligareported that his nephew (his brother's son) has cystic fibrosis, diagnosed on newborn screening. His nephew is currently 792years old. [name] three children from a previous partner, and her 543year old son has a ventricular septal defect, diagnosed at 438months of age. He is followed by Meagan [name] and is not expected to require surgical correction. He is otherwise healthy. Mr. Meagan Reininghas an 870year old son from a previous partner who is reportedly healthy. The couple reported that  their mutual grandfather had a brother with intellectual disability. This relative, the couple's great-uncle, is currently in his 750'sand is described to have "the mind of a 25year old." He was not described to have dysmorphic features, and no underlying etiology was known by the couple.   We discussed that children born to a consanguineous couple are at increased risk for genetic conditions and multifactorial conditions.  This increase in risk is primarily related to the possibility of both parents passing on an autosomal recessive gene. We spent time reviewing genes, chromosomes, and patterns of inheritance. We explained that every person carries approximately 7-10 non-working genes that when received in a double dose results in an autosomal recessive genetic condition.  In general, unrelated couples have a relatively low risk of having a child with a recessive condition because the likelihood of both parents carrying the same non-working recessive gene is very low.  However, when a couple is related, they have inherited some of their genetic information from a common ancestor, which leads to an increased chance that they may carry the same recessive gene and have a child with a recessive condition.  For first cousin unions, individuals share 1/8 of their genes in common, meaning the risk to have a child with a birth defect, intellectual disability, or genetic condition is increased approximately 2-4% above the general population risk (3-5%); thus, the overall risk is estimated to be 5-9% for offspring.   Given that Mr. Meagan [name] has cystic fibrosis (CF), Mr. Meagan Reiningwould have a 1 in 2 (50%) chance to be a CF carrier. Ms. SOzdemirwould have a 1 in 8 chance to be a CF carrier. She previously had CF carrier screening for common pathogenic changes through her OB, which  was within normal limits. Thus, her risk to be a CF carrier has been reduced but not eliminated. We discussed the additional option of  CF carrier screening through gene sequencing, which increases detection rates to >99%.   Additionally, there are a myriad of genetic disorders that occur more frequently in specific ethnic groups, those which can be traced to particular geographic locations. We discussed that although these genetic disorders are much more prevalent in specific ethnic groups, as families are becoming increasingly multiracial and multicultural, these conditions can occur in anyone from any race or ethnicity. For this reason, we discussed the availability of ethnic specific genetic carrier screening, professional society (ACOG) recommended carrier testing, and pan-ethnic carrier screening.  We discussed that prenatal diagnosis via amniocentesis would be available in a pregnancy in the event that a couple was identified to be a carrier for the same condition(s). We reviewed risks, benefits, and limitations of amniocentesis. [name] and her partner stated that they would not be interested in amniocentesis in pregnancy given the associated risk for complications.   We reviewed that ACOG currently recommends that all patients be offered carrier screening for cystic fibrosis, spinal muscular atrophy and based on ethnicity, hemoglobinopathies. In addition, they were counseled that there are a variety of genetic screening laboratories that have pan-ethnic, or expanded, carrier screening panels, which evaluate carrier status for a wide range of genetic conditions. Some of these conditions are severe and actionable, but also rare; others occur more commonly, but are less severe. We discussed that testing options range from screening for a single condition to panels of more than 200 autosomal or X-linked genetic conditions. We reviewed that the prevalence of each condition varies (and often varies with ethnicity). Thus the couples' background risk to be a carrier for each of these various conditions would range, and in some cases be  very low or unknown. Similarly, the detection rate varies with each condition and also varies in some cases with ethnicity, ranging from greater than 99% (in the case of hemoglobinopathies) to unknown. We reviewed that a negative carrier screen would thus reduce, but not eliminate the chance to be a carrier for these conditions. For some conditions included on specific pan-ethnic carrier screening panels, the pre-test carrier frequency and/or the detection rate is unknown. Thus, for some conditions, the exact reduction of risk with a negative carrier screening result may not be able to be quantified. We reviewed that in the event that one partner is found to be a carrier for one or more conditions, carrier screening would be available to the partner for those conditions. We discussed that while for the majority of conditions carrier status does not incur a risk for medical symptoms, there are some conditions for which carrier status increases the risk for medical implications.  After thoughtful consideration of their options, this couple elected to have the expanded carrier screening panel through [facility] laboratory. This panel screens for carrier status of 175 hereditary disorders, including ACOG recommendedations. We discussed that results for both Mr. Meagan [name] and [name] will be available in approximately 2 weeks. They understand that available screening cannot diagnose or rule out all birth defects or genetic conditions.   Regarding the reported family history of VSD for patient's previous son, the history is most suggestive of an isolated congenital heart defect (CHD). We reviewed that CHDs can be isolated or a feature of an underlying genetic condition. Congenital heart defects are most often multifactorial in etiology, but can also  result from chromosome aberrations, single gene conditions, or teratogenic exposures. We discussed that isolated, nonsyndromic CHDs occur in approximately 1% of  the general population. In the case of multifactorial inheritance, recurrence risk for CHD for second degree relatives (the current pregnancy) is approximately 1-2%. However, given the presence of consanguinity for the couple the risk may be elevated above this risk assessment but data is not available to quantify.  If the patient's son has an underlying genetic condition that caused the CHD, the risk of recurrence could be increased. In this case, the specific chance of recurrence depends on the inheritance of the condition. We discussed the availability of a detailed anatomy ultrasound and fetal echocardiogram to assess the development of the heart during the pregnancy.   Additionally, given the increased risk for birth defects associated with offspring of consanguineous couples, we discussed the availability of detailed ultrasound in the second trimester to assess fetal anatomy and growth in detail. We discussed that ultrasound cannot diagnose or rule out all birth defects or genetic conditions prenatally.   Regarding the great-uncle with intellectual disability, they were counseled that there are many different causes of intellectual disabilities including environmental, multifactorial, and genetic etiologies. This great-uncle is the offspring of a consanguineous union (first cousins), and we reviewed that this increases the chance for intellectual disability in offspring due to possible underlying genetic contributions.  We discussed that a specific diagnosis for intellectual disability can be determined in approximately 50% of these individuals.  In the remaining 50% of individuals, a diagnosis may never be determined.  Regarding genetic causes, we discussed that chromosome aberrations (aneuploidy, deletions, duplications, insertions, and translocations) are responsible for a small percentage of individuals with intellectual disability.  Many individuals with chromosome aberrations have additional  differences, including congenital anomalies or minor dysmorphisms.  Likewise, single gene conditions are the underlying cause of intellectual delay in some families.  We discussed that many gene conditions have intellectual disability as a feature, but also often include other physical or medical differences.   We discussed that without more specific information, it is difficult to provide an accurate risk assessment and that prenatal screening or testing for the specific intellectual disability in the family would not be available in the pregnancy.  Further genetic counseling is warranted if more information is obtained.  [name] reported that her mother has bipolar disorder and schizophrenia. [name] reported a personal history of anxiety. We discussed that for the majority of cases of mental health conditions, an underlying genetic cause is not known but a combination of genetic and environmental factors (multifactorial inheritance) are suspected to contribute to their onset.  Recurrence risk for mental health conditions in the patient's offspring would be increased above the general population risk, in the case of multifactorial inheritance observed. In some families, mental health conditions may even be dominant, meaning that when one parent has the condition each child could have up to a 50% risk to inherit the condition. We discussed that while there is currently not comprehensive prenatal screening or testing for mental health conditions, it would be helpful for pediatricians to be aware of this family history to ensure that family members are followed appropriately. The patient understands that prenatal testing or screening is not available for the majority of mental health conditions.   The couple reported a first cousin (their mutual aunt's daughter) who had a daughter who died at age 37-14 days of age of unknown etiology. The baby reportedly was born full term and  not described to have any  birth defects. This mutual first cousin to the couple has three additional children who are reportedly healthy. We discussed that etiologies for the neonatal loss could range from environmental, genetic, or multifactorial and that recurrence risk for relatives is not able to be accurately assessed without additional information.   The family histories were otherwise found to be noncontributory for birth defects, intellectual disability, and known genetic conditions. Without further information regarding the provided family history, an accurate genetic risk cannot be calculated. Further genetic counseling is warranted if more information is obtained.  We reviewed other available prenatal screening and diagnostic options.  We briefly discussed chromosomes, nondisjunction and associated risk for fetal chromosome conditions related to maternal age. Given the patient's age alone, the pregnancy is not considered to be at increased risk for fetal aneuploidy. Ms. Wegman has elected to pursue integrated screening through her OB provider, which is currently in process. We discussed that Integrated screening is not diagnostic but rather provides a pregnancy specific risk assessment.   I counseled this couple regarding the above risks and available options.  The approximate face-to-face time with the genetic counselor was 55 minutes.  Chipper Oman, MS Certified Genetic Counselor 08/02/2017

## 2017-08-04 LAB — CULTURE, OB URINE: Culture: 80000 — AB

## 2017-08-07 ENCOUNTER — Encounter: Payer: Self-pay | Admitting: *Deleted

## 2017-08-07 ENCOUNTER — Telehealth: Payer: Self-pay | Admitting: *Deleted

## 2017-08-07 ENCOUNTER — Other Ambulatory Visit: Payer: Self-pay | Admitting: Advanced Practice Midwife

## 2017-08-07 MED ORDER — FLUCONAZOLE 150 MG PO TABS: ORAL_TABLET | ORAL | 2 refills | Status: DC

## 2017-08-07 NOTE — Telephone Encounter (Signed)
Patient states she has yeast infection from taking keflex for UTI. She has taken OTC Monistat and is experiencing burning to the point where she "wants to cry".  She is requesting a pill or something else. Please advise.

## 2017-08-07 NOTE — Progress Notes (Signed)
Diflucan for yeast--says monistat burns too much and isn't working

## 2017-08-07 NOTE — Telephone Encounter (Signed)
[facility]

## 2017-08-09 ENCOUNTER — Other Ambulatory Visit: Payer: Self-pay

## 2017-08-13 ENCOUNTER — Telehealth (HOSPITAL_COMMUNITY): Payer: Self-pay | Admitting: MS"

## 2017-08-13 NOTE — Telephone Encounter (Signed)
Called [name] to discuss her carrier screening results. [name] and Mr. Meagan [name] had expanded carrier screening including the ACOG recommended conditions (SMA, CF, and hemoglobinopathies) through [facility]. The patients were identified by name and DOB. We reviewed that the results for [name] are negative, indicating that she does not have a detectable gene alteration in any of the genes for which analysis was performed. We reviewed that carrier screening does not detect all carriers of these conditions, but a normal result significantly decreases the likelihood of being a carrier, and therefore, the overall reproductive risk. We reviewed that [facility] sequences most of the genes, which is associated with a high detection rate for carriers, thus a negative screen is very reassuring.   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 This particular variant has been associated with partial biotinidase deficiency. We reviewed the autosomal recessive inheritance of this condition. Biotinidase deficiency is characterized by inability to process biotin due to enzymatic deficiency. Profound biotinidase deficiency is defined as less than 10% mean normal serum biotinidase activity, and partial biotinidase deficiency is characterized by 10-30% of normal serum biotinidase activity. If untreated, symptoms can include neurologic features, with partial biotinidase deficiency associated with less severe symptoms or symptoms only during times of stress.   We discussed that biotinidase deficiency is highly treatable, and management includes biotin supplementation. The treatment is lifelong and if started prior to symptoms, typically prevents symptoms. We discussed that biotinidase deficiency is included on the newborn screening panel in [place]. However, we discussed that the pregnancy is considered to be at low risk for this condition, given that [name]' carrier screening was within normal limits for Biotinidase deficiency. Approximately >99% of carriers will be identified by DNA sequencing of the BTD gene. Thus, the risk for biotinidase deficiency in the current pregnancy is approximately 1 in 54,000.   We discussed that his results for the remaining conditions are negative, indicating that he does not have a detectable gene alteration in any of the genes for which analysis was performed. We reviewed that carrier screening does not detect all carriers of these conditions, but a normal result significantly decreases the likelihood of being a carrier, and therefore, the overall reproductive risk. We reviewed that [facility] sequences most of the genes, which is associated with a high detection rate for carriers, thus a negative screen is very reassuring. All questions were answered to her satisfaction, she was encouraged to call with additional questions or concerns.   All questions were answered to their satisfaction.  ? Chipper Oman MS Insurance risk surveyor

## 2017-08-21 ENCOUNTER — Ambulatory Visit (INDEPENDENT_AMBULATORY_CARE_PROVIDER_SITE_OTHER): Payer: Medicaid Other | Admitting: Advanced Practice Midwife

## 2017-08-21 VITALS — BP 128/60 | HR 106 | Wt 170.5 lb

## 2017-08-21 DIAGNOSIS — Z843 Family history of consanguinity: Secondary | ICD-10-CM

## 2017-08-21 DIAGNOSIS — O09299 Supervision of pregnancy with other poor reproductive or obstetric history, unspecified trimester: Secondary | ICD-10-CM

## 2017-08-21 DIAGNOSIS — Z331 Pregnant state, incidental: Secondary | ICD-10-CM

## 2017-08-21 DIAGNOSIS — O99342 Other mental disorders complicating pregnancy, second trimester: Secondary | ICD-10-CM

## 2017-08-21 DIAGNOSIS — Z1389 Encounter for screening for other disorder: Secondary | ICD-10-CM

## 2017-08-21 DIAGNOSIS — Z3A16 16 weeks gestation of pregnancy: Secondary | ICD-10-CM

## 2017-08-21 DIAGNOSIS — F419 Anxiety disorder, unspecified: Secondary | ICD-10-CM

## 2017-08-21 DIAGNOSIS — O09292 Supervision of pregnancy with other poor reproductive or obstetric history, second trimester: Secondary | ICD-10-CM

## 2017-08-21 DIAGNOSIS — Z3482 Encounter for supervision of other normal pregnancy, second trimester: Secondary | ICD-10-CM

## 2017-08-21 DIAGNOSIS — Z1379 Encounter for other screening for genetic and chromosomal anomalies: Secondary | ICD-10-CM

## 2017-08-21 LAB — POCT URINALYSIS DIPSTICK
Blood, UA: NEGATIVE
Glucose, UA: NEGATIVE
Ketones, UA: NEGATIVE
Leukocytes, UA: NEGATIVE
Nitrite, UA: NEGATIVE
Protein, UA: NEGATIVE

## 2017-08-21 MED ORDER — ASPIRIN EC 81 MG PO TBEC: 162.0000 mg | DELAYED_RELEASE_TABLET | Freq: Every day | ORAL | 6 refills | Status: DC

## 2017-08-21 NOTE — Progress Notes (Signed)
[id number] Estimated Date of Delivery: 01/31/18  Blood pressure 128/60, pulse (!) 106, weight 170 lb 8 oz (77.3 kg), last menstrual period 04/26/2017, not currently breastfeeding.   BP weight and urine results all reviewed and noted.  Please refer to the obstetrical flow sheet for the fundal height and fetal heart rate documentation:  Patient reports good fetal movement, denies any bleeding and no rupture of membranes symptoms or regular contractions. Patient is without complaints. Going to start counsling soon (has already done intake) Takes BP at home, sometines >140 Genetics counseling/testing revealed no carrier status for 175 different things.  All questions were answered.  Orders Placed This Encounter  Procedures  . US MFM OB DETAIL +14 WK  . INTEGRATED 2  . POCT Urinalysis Dipstick    Plan:  Continued routine obstetrical care, US w/MFM (recommended by genetics counselor) 09/06/17 at 1:30 Bring BP cuff to next visit to make sure it's calibrated.  ASA 162mg  qd  now Takes an extra 1/2 xanax at night sometimes for anxiety Has done intake w/[facility] counseling.   Return in about 4 weeks (around 09/18/2017) for LROB.

## 2017-08-21 NOTE — Patient Instructions (Addendum)
Meagan [name], I greatly value your feedback.  If you receive a survey following your visit with [name] today, we appreciate you taking the time to fill it out.  Thanks, Cathie Beams, CNM    Your ultrasound is scheduled at [facility] w/[facility] on 09/06/17 at 1:15pm  NO CHILDREN ALLOWED DUE TO FLU RESTRICTIONS.   [phone] if this is not a good time for you.         Second Trimester of Pregnancy The second trimester is from week 14 through week 27 (months 4 through 6). The second trimester is often a time when you feel your best. Your body has adjusted to being pregnant, and you begin to feel better physically. Usually, morning sickness has lessened or quit completely, you may have more energy, and you may have an increase in appetite. The second trimester is also a time when the fetus is growing rapidly. At the end of the sixth month, the fetus is about 9 inches long and weighs about 1 pounds. You will likely begin to feel the baby move (quickening) between 16 and 20 weeks of pregnancy. Body changes during your second trimester Your body continues to go through many changes during your second trimester. The changes vary from woman to woman.  Your weight will continue to increase. You will notice your lower abdomen bulging out.  You may begin to get stretch marks on your hips, abdomen, and breasts.  You may develop headaches that can be relieved by medicines. The medicines should be approved by your health care provider.  You may urinate more often because the fetus is pressing on your bladder.  You may develop or continue to have heartburn as a result of your pregnancy.  You may develop constipation because certain hormones are causing the muscles that push waste through your intestines to slow down.  You may develop hemorrhoids or swollen, bulging veins (varicose veins).  You may have back pain. This is caused by: ? Weight gain. ? Pregnancy  hormones that are relaxing the joints in your pelvis. ? A shift in weight and the muscles that support your balance.  Your breasts will continue to grow and they will continue to become tender.  Your gums may bleed and may be sensitive to brushing and flossing.  Dark spots or blotches (chloasma, mask of pregnancy) may develop on your face. This will likely fade after the baby is born.  A dark line from your belly button to the pubic area (linea nigra) may appear. This will likely fade after the baby is born.  You may have changes in your hair. These can include thickening of your hair, rapid growth, and changes in texture. Some women also have hair loss during or after pregnancy, or hair that feels dry or thin. Your hair will most likely return to normal after your baby is born.  What to expect at prenatal visits During a routine prenatal visit:  You will be weighed to make sure you and the fetus are growing normally.  Your blood pressure will be taken.  Your abdomen will be measured to track your baby's growth.  The fetal heartbeat will be listened to.  Any test results from the previous visit will be discussed.  Your health care provider may ask you:  How you are feeling.  If you are feeling the baby move.  If you have had any abnormal symptoms, such as leaking fluid, bleeding, severe headaches, or abdominal cramping.  If you are  using any tobacco products, including cigarettes, chewing tobacco, and electronic cigarettes.  If you have any questions.  Other tests that may be performed during your second trimester include:  Blood tests that check for: ? Low iron levels (anemia). ? High blood sugar that affects pregnant women (gestational diabetes) between 11 and 28 weeks. ? Rh antibodies. This is to check for a protein on red blood cells (Rh factor).  Urine tests to check for infections, diabetes, or protein in the urine.  An ultrasound to confirm the proper growth and  development of the baby.  An amniocentesis to check for possible genetic problems.  Fetal screens for spina bifida and Down syndrome.  HIV (human immunodeficiency virus) testing. Routine prenatal testing includes screening for HIV, unless you choose not to have this test.  Follow these instructions at home: Medicines  Follow your health care provider's instructions regarding medicine use. Specific medicines may be either safe or unsafe to take during pregnancy.  Take a prenatal vitamin that contains at least 600 micrograms (mcg) of folic acid.  If you develop constipation, try taking a stool softener if your health care provider approves. Eating and drinking  Eat a balanced diet that includes fresh fruits and vegetables, whole grains, good sources of protein such as meat, eggs, or tofu, and low-fat dairy. Your health care provider will help you determine the amount of weight gain that is right for you.  Avoid raw meat and uncooked cheese. These carry germs that can cause birth defects in the baby.  If you have low calcium intake from food, talk to your health care provider about whether you should take a daily calcium supplement.  Limit foods that are high in fat and processed sugars, such as fried and sweet foods.  To prevent constipation: ? Drink enough fluid to keep your urine clear or pale yellow. ? Eat foods that are high in fiber, such as fresh fruits and vegetables, whole grains, and beans. Activity  Exercise only as directed by your health care provider. Most women can continue their usual exercise routine during pregnancy. Try to exercise for 30 minutes at least 5 days a week. Stop exercising if you experience uterine contractions.  Avoid heavy lifting, wear low heel shoes, and practice good posture.  A sexual relationship may be continued unless your health care provider directs you otherwise. Relieving pain and discomfort  Wear a good support bra to prevent discomfort  from breast tenderness.  Take warm sitz baths to soothe any pain or discomfort caused by hemorrhoids. Use hemorrhoid cream if your health care provider approves.  Rest with your legs elevated if you have leg cramps or low back pain.  If you develop varicose veins, wear support hose. Elevate your feet for 15 minutes, 3-4 times a day. Limit salt in your diet. Prenatal Care  Write down your questions. Take them to your prenatal visits.  Keep all your prenatal visits as told by your health care provider. This is important. Safety  Wear your seat belt at all times when driving.  Make a list of emergency phone numbers, including numbers for family, friends, the hospital, and police and fire departments. General instructions  Ask your health care provider for a referral to a local prenatal education class. Begin classes no later than the beginning of month 6 of your pregnancy.  Ask for help if you have counseling or nutritional needs during pregnancy. Your health care provider can offer advice or refer you to specialists for help  with various needs.  Do not use hot tubs, steam rooms, or saunas.  Do not douche or use tampons or scented sanitary pads.  Do not cross your legs for long periods of time.  Avoid cat litter boxes and soil used by cats. These carry germs that can cause birth defects in the baby and possibly loss of the fetus by miscarriage or stillbirth.  Avoid all smoking, herbs, alcohol, and unprescribed drugs. Chemicals in these products can affect the formation and growth of the baby.  Do not use any products that contain nicotine or tobacco, such as cigarettes and e-cigarettes. If you need help quitting, ask your health care provider.  Visit your dentist if you have not gone yet during your pregnancy. Use a soft toothbrush to brush your teeth and be gentle when you floss. Contact a health care provider if:  You have dizziness.  You have mild pelvic cramps, pelvic  pressure, or nagging pain in the abdominal area.  You have persistent nausea, vomiting, or diarrhea.  You have a bad smelling vaginal discharge.  You have pain when you urinate. Get help right away if:  You have a fever.  You are leaking fluid from your vagina.  You have spotting or bleeding from your vagina.  You have severe abdominal cramping or pain.  You have rapid weight gain or weight loss.  You have shortness of breath with chest pain.  You notice sudden or extreme swelling of your face, hands, ankles, feet, or legs.  You have not felt your baby move in over an hour.  You have severe headaches that do not go away when you take medicine.  You have vision changes. Summary  The second trimester is from week 14 through week 27 (months 4 through 6). It is also a time when the fetus is growing rapidly.  Your body goes through many changes during pregnancy. The changes vary from woman to woman.  Avoid all smoking, herbs, alcohol, and unprescribed drugs. These chemicals affect the formation and growth your baby.  Do not use any tobacco products, such as cigarettes, chewing tobacco, and e-cigarettes. If you need help quitting, ask your health care provider.  Contact your health care provider if you have any questions. Keep all prenatal visits as told by your health care provider. This is important. This information is not intended to replace advice given to you by your health care provider. Make sure you discuss any questions you have with your health care provider.      CHILDBIRTH CLASSES [phone] is the phone number for Pregnancy Classes or hospital tours at [facility].   You will be referred to  [email or web address] for more information on childbirth classes  At this site you may register for classes. You may sign up for a waiting list if classes are full. Please SIGN  UP FOR THIS!.   When the waiting list becomes long, sometimes new classes can be added.

## 2017-08-23 LAB — INTEGRATED 2
AFP MoM: 1.02
Alpha-Fetoprotein: 30.9 ng/mL
Crown Rump Length: 63.7 mm
DIA MoM: 4.2
DIA Value: 649.2 pg/mL
Estriol, Unconjugated: 1.18 ng/mL
Gest. Age on Collection Date: 12.7 weeks
Gestational Age: 16.7 weeks
Maternal Age at EDD: 24.7 yr
Nuchal Translucency (NT): 1.4 mm
Nuchal Translucency MoM: 0.87
Number of Fetuses: 1
PAPP-A MoM: 0.32
PAPP-A Value: 293.2 ng/mL
Test Results:: NEGATIVE
Weight: 171 [lb_av]
Weight: 171 [lb_av]
hCG MoM: 1.5
hCG Value: 40.6 IU/mL
uE3 MoM: 1.32

## 2017-09-06 ENCOUNTER — Other Ambulatory Visit: Payer: Self-pay | Admitting: Advanced Practice Midwife

## 2017-09-06 ENCOUNTER — Encounter (HOSPITAL_COMMUNITY): Payer: Self-pay

## 2017-09-06 ENCOUNTER — Ambulatory Visit (HOSPITAL_COMMUNITY)
Admission: RE | Admit: 2017-09-06 | Discharge: 2017-09-06 | Disposition: A | Discharge status: Home / Self Care | Payer: Medicaid Other | Source: Ambulatory Visit | Attending: Advanced Practice Midwife | Admitting: Advanced Practice Midwife

## 2017-09-06 DIAGNOSIS — O99322 Drug use complicating pregnancy, second trimester: Secondary | ICD-10-CM

## 2017-09-06 DIAGNOSIS — O99332 Smoking (tobacco) complicating pregnancy, second trimester: Secondary | ICD-10-CM

## 2017-09-06 DIAGNOSIS — Z3A19 19 weeks gestation of pregnancy: Secondary | ICD-10-CM | POA: Diagnosis not present

## 2017-09-06 DIAGNOSIS — Z3481 Encounter for supervision of other normal pregnancy, first trimester: Secondary | ICD-10-CM

## 2017-09-06 DIAGNOSIS — Z3689 Encounter for other specified antenatal screening: Secondary | ICD-10-CM | POA: Diagnosis not present

## 2017-09-06 DIAGNOSIS — Z843 Family history of consanguinity: Secondary | ICD-10-CM

## 2017-09-09 ENCOUNTER — Other Ambulatory Visit (HOSPITAL_COMMUNITY): Payer: Self-pay | Admitting: *Deleted

## 2017-09-09 DIAGNOSIS — Z362 Encounter for other antenatal screening follow-up: Secondary | ICD-10-CM

## 2017-09-18 ENCOUNTER — Ambulatory Visit (INDEPENDENT_AMBULATORY_CARE_PROVIDER_SITE_OTHER): Payer: Medicaid Other | Admitting: Advanced Practice Midwife

## 2017-09-18 ENCOUNTER — Encounter: Payer: Self-pay | Admitting: Advanced Practice Midwife

## 2017-09-18 VITALS — BP 90/60 | HR 95 | Wt 175.0 lb

## 2017-09-18 DIAGNOSIS — Z3A20 20 weeks gestation of pregnancy: Secondary | ICD-10-CM

## 2017-09-18 DIAGNOSIS — Z364 Encounter for antenatal screening for fetal growth retardation: Secondary | ICD-10-CM

## 2017-09-18 DIAGNOSIS — Z3482 Encounter for supervision of other normal pregnancy, second trimester: Secondary | ICD-10-CM

## 2017-09-18 DIAGNOSIS — Z0489 Encounter for examination and observation for other specified reasons: Secondary | ICD-10-CM

## 2017-09-18 DIAGNOSIS — Z1389 Encounter for screening for other disorder: Secondary | ICD-10-CM

## 2017-09-18 DIAGNOSIS — IMO0002 Reserved for concepts with insufficient information to code with codable children: Secondary | ICD-10-CM

## 2017-09-18 DIAGNOSIS — Z331 Pregnant state, incidental: Secondary | ICD-10-CM

## 2017-09-18 LAB — POCT URINALYSIS DIPSTICK
Blood, UA: NEGATIVE
Glucose, UA: NEGATIVE
Ketones, UA: NEGATIVE
Leukocytes, UA: NEGATIVE
Nitrite, UA: NEGATIVE
Protein, UA: NEGATIVE

## 2017-09-18 NOTE — Addendum Note (Signed)
Addended by: Jacklyn ShellRESENZO-DISHMON, Lexx Monte on: 09/18/2017 02:40 PM   Modules accepted: Orders

## 2017-09-18 NOTE — Patient Instructions (Signed)
Meagan [name], I greatly value your feedback.  If you receive a survey following your visit with us today, we appreciate you taking the time to fill it out.  Thanks, Meagan [name], CNM     Second Trimester of Pregnancy The second trimester is from week 14 through week 27 (months 4 through 6). The second trimester is often a time when you feel your best. Your body has adjusted to being pregnant, and you begin to feel better physically. Usually, morning sickness has lessened or quit completely, you may have more energy, and you may have an increase in appetite. The second trimester is also a time when the fetus is growing rapidly. At the end of the sixth month, the fetus is about 9 inches long and weighs about 1 pounds. You will likely begin to feel the baby move (quickening) between 16 and 20 weeks of pregnancy. Body changes during your second trimester Your body continues to go through many changes during your second trimester. The changes vary from woman to woman.  Your weight will continue to increase. You will notice your lower abdomen bulging out.  You may begin to get stretch marks on your hips, abdomen, and breasts.  You may develop headaches that can be relieved by medicines. The medicines should be approved by your health care provider.  You may urinate more often because the fetus is pressing on your bladder.  You may develop or continue to have heartburn as a result of your pregnancy.  You may develop constipation because certain hormones are causing the muscles that push waste through your intestines to slow down.  You may develop hemorrhoids or swollen, bulging veins (varicose veins).  You may have back pain. This is caused by: ? Weight gain. ? Pregnancy hormones that are relaxing the joints in your pelvis. ? A shift in weight and the muscles that support your balance.  Your breasts will continue to grow and they will continue to become tender.  Your gums  may bleed and may be sensitive to brushing and flossing.  Dark spots or blotches (chloasma, mask of pregnancy) may develop on your face. This will likely fade after the baby is born.  A dark line from your belly button to the pubic area (linea nigra) may appear. This will likely fade after the baby is born.  You may have changes in your hair. These can include thickening of your hair, rapid growth, and changes in texture. Some women also have hair loss during or after pregnancy, or hair that feels dry or thin. Your hair will most likely return to normal after your baby is born.  What to expect at prenatal visits During a routine prenatal visit:  You will be weighed to make sure you and the fetus are growing normally.  Your blood pressure will be taken.  Your abdomen will be measured to track your baby's growth.  The fetal heartbeat will be listened to.  Any test results from the previous visit will be discussed.  Your health care provider may ask you:  How you are feeling.  If you are feeling the baby move.  If you have had any abnormal symptoms, such as leaking fluid, bleeding, severe headaches, or abdominal cramping.  If you are using any tobacco products, including cigarettes, chewing tobacco, and electronic cigarettes.  If you have any questions.  Other tests that may be performed during your second trimester include:  Blood tests that check for: ? Low iron levels (anemia). ?  High blood sugar that affects pregnant women (gestational diabetes) between 20 and 28 weeks. ? Rh antibodies. This is to check for a protein on red blood cells (Rh factor).  Urine tests to check for infections, diabetes, or protein in the urine.  An ultrasound to confirm the proper growth and development of the baby.  An amniocentesis to check for possible genetic problems.  Fetal screens for spina bifida and Down syndrome.  HIV (human immunodeficiency virus) testing. Routine prenatal testing  includes screening for HIV, unless you choose not to have this test.  Follow these instructions at home: Medicines  Follow your health care provider's instructions regarding medicine use. Specific medicines may be either safe or unsafe to take during pregnancy.  Take a prenatal vitamin that contains at least 600 micrograms (mcg) of folic acid.  If you develop constipation, try taking a stool softener if your health care provider approves. Eating and drinking  Eat a balanced diet that includes fresh fruits and vegetables, whole grains, good sources of protein such as meat, eggs, or tofu, and low-fat dairy. Your health care provider will help you determine the amount of weight gain that is right for you.  Avoid raw meat and uncooked cheese. These carry germs that can cause birth defects in the baby.  If you have low calcium intake from food, talk to your health care provider about whether you should take a daily calcium supplement.  Limit foods that are high in fat and processed sugars, such as fried and sweet foods.  To prevent constipation: ? Drink enough fluid to keep your urine clear or pale yellow. ? Eat foods that are high in fiber, such as fresh fruits and vegetables, whole grains, and beans. Activity  Exercise only as directed by your health care provider. Most women can continue their usual exercise routine during pregnancy. Try to exercise for 30 minutes at least 5 days a week. Stop exercising if you experience uterine contractions.  Avoid heavy lifting, wear low heel shoes, and practice good posture.  A sexual relationship may be continued unless your health care provider directs you otherwise. Relieving pain and discomfort  Wear a good support bra to prevent discomfort from breast tenderness.  Take warm sitz baths to soothe any pain or discomfort caused by hemorrhoids. Use hemorrhoid cream if your health care provider approves.  Rest with your legs elevated if you have  leg cramps or low back pain.  If you develop varicose veins, wear support hose. Elevate your feet for 15 minutes, 3-4 times a day. Limit salt in your diet. Prenatal Care  Write down your questions. Take them to your prenatal visits.  Keep all your prenatal visits as told by your health care provider. This is important. Safety  Wear your seat belt at all times when driving.  Make a list of emergency phone numbers, including numbers for family, friends, the hospital, and police and fire departments. General instructions  Ask your health care provider for a referral to a local prenatal education class. Begin classes no later than the beginning of month 6 of your pregnancy.  Ask for help if you have counseling or nutritional needs during pregnancy. Your health care provider can offer advice or refer you to specialists for help with various needs.  Do not use hot tubs, steam rooms, or saunas.  Do not douche or use tampons or scented sanitary pads.  Do not cross your legs for long periods of time.  Avoid cat litter boxes and  soil used by cats. These carry germs that can cause birth defects in the baby and possibly loss of the fetus by miscarriage or stillbirth.  Avoid all smoking, herbs, alcohol, and unprescribed drugs. Chemicals in these products can affect the formation and growth of the baby.  Do not use any products that contain nicotine or tobacco, such as cigarettes and e-cigarettes. If you need help quitting, ask your health care provider.  Visit your dentist if you have not gone yet during your pregnancy. Use a soft toothbrush to brush your teeth and be gentle when you floss. Contact a health care provider if:  You have dizziness.  You have mild pelvic cramps, pelvic pressure, or nagging pain in the abdominal area.  You have persistent nausea, vomiting, or diarrhea.  You have a bad smelling vaginal discharge.  You have pain when you urinate. Get help right away if:  You  have a fever.  You are leaking fluid from your vagina.  You have spotting or bleeding from your vagina.  You have severe abdominal cramping or pain.  You have rapid weight gain or weight loss.  You have shortness of breath with chest pain.  You notice sudden or extreme swelling of your face, hands, ankles, feet, or legs.  You have not felt your baby move in over an hour.  You have severe headaches that do not go away when you take medicine.  You have vision changes. Summary  The second trimester is from week 14 through week 27 (months 4 through 6). It is also a time when the fetus is growing rapidly.  Your body goes through many changes during pregnancy. The changes vary from woman to woman.  Avoid all smoking, herbs, alcohol, and unprescribed drugs. These chemicals affect the formation and growth your baby.  Do not use any tobacco products, such as cigarettes, chewing tobacco, and e-cigarettes. If you need help quitting, ask your health care provider.  Contact your health care provider if you have any questions. Keep all prenatal visits as told by your health care provider. This is important. This information is not intended to replace advice given to you by your health care provider. Make sure you discuss any questions you have with your health care provider.      CHILDBIRTH CLASSES [phone] is the phone number for Pregnancy Classes or hospital tours at [place] will be referred to  [email or web address] for more information on childbirth classes  At this site you may register for classes. You may sign up for a waiting list if classes are full. Please SIGN UP FOR THIS!.   When the waiting list becomes long, sometimes new classes can be added.

## 2017-09-18 NOTE — Progress Notes (Signed)
  [id number] Estimated Date of Delivery: 01/31/18  Blood pressure 90/60, pulse 95, weight 175 lb (79.4 kg), last menstrual period 04/26/2017, not currently breastfeeding.   BP weight and urine results all reviewed and noted.    Please refer to the obstetrical flow sheet for the fundal height and fetal heart rate documentation:  Anatomy scan at [facility] showed sl shortened long bones.  FU us scheduled for 4/5 at [facility], but pt wants to come here for growth US.    Patient reports good fetal movement, denies any bleeding and no rupture of membranes symptoms or regular contractions. Patient is without complaints. All questions were answered.   Physical Assessment:   Vitals:   09/18/17 1352  BP: 90/60  Pulse: 95  Weight: 175 lb (79.4 kg)  Body mass index is 32.01 kg/m.        Physical Examination:   General appearance: Well appearing, and in no distress  Mental status: Alert, oriented to person, place, and time  Skin: Warm & dry  Cardiovascular: Normal heart rate noted  Respiratory: Normal respiratory effort, no distress  Abdomen: Soft, gravid, nontender  Pelvic: Cervical exam deferred         Extremities: Edema: None  Fetal Status:     Movement: Absent    Results for orders placed or performed in visit on 09/18/17 (from the past 24 hour(s))  POCT urinalysis dipstick   Collection Time: 09/18/17  1:59 PM  Result Value Ref Range   Color, UA     Clarity, UA     Glucose, UA neg    Bilirubin, UA     Ketones, UA neg    Spec Grav, UA  1.010 - 1.025   Blood, UA neg    pH, UA  5.0 - 8.0   Protein, UA neg    Urobilinogen, UA  0.2 or 1.0 E.U./dL   Nitrite, UA neg    Leukocytes, UA Negative Negative   Appearance     Odor       Orders Placed This Encounter  Procedures  . POCT urinalysis dipstick    Plan:  Continued routine obstetrical care, pt doesn't want to go back to [facility] for US if possible. If needs f/u for growth, would prefer to have them here.  Return in about 2 weeks  (around 10/04/2017) for US:EFW '. And 4 weeks for LROB

## 2017-09-25 ENCOUNTER — Encounter: Payer: Self-pay | Admitting: Advanced Practice Midwife

## 2017-09-27 ENCOUNTER — Other Ambulatory Visit: Payer: Self-pay | Admitting: Obstetrics & Gynecology

## 2017-09-27 MED ORDER — ALPRAZOLAM 0.5 MG PO TABS: 0.5000 mg | ORAL_TABLET | Freq: Three times a day (TID) | ORAL | 3 refills | Status: DC | PRN

## 2017-10-02 ENCOUNTER — Ambulatory Visit (INDEPENDENT_AMBULATORY_CARE_PROVIDER_SITE_OTHER): Payer: Medicaid Other

## 2017-10-02 DIAGNOSIS — Z843 Family history of consanguinity: Secondary | ICD-10-CM

## 2017-10-02 DIAGNOSIS — Z3A22 22 weeks gestation of pregnancy: Secondary | ICD-10-CM | POA: Diagnosis not present

## 2017-10-02 DIAGNOSIS — Z3402 Encounter for supervision of normal first pregnancy, second trimester: Secondary | ICD-10-CM

## 2017-10-02 DIAGNOSIS — Z0489 Encounter for examination and observation for other specified reasons: Secondary | ICD-10-CM

## 2017-10-02 DIAGNOSIS — Z364 Encounter for antenatal screening for fetal growth retardation: Secondary | ICD-10-CM

## 2017-10-02 DIAGNOSIS — IMO0002 Reserved for concepts with insufficient information to code with codable children: Secondary | ICD-10-CM

## 2017-10-03 NOTE — Progress Notes (Signed)
US 22+5 wks,breech,cx 3.7 cm,anterior pl gr 0,svp of fluid 4.7 cm,normal ovaries bilat,fhr 161 bpm,EFW 471 g 16 %,FL 5.5%,need additional images of right foot to r/o club foot,fetus is in breech position and difficult to evaluate feet.

## 2017-10-04 ENCOUNTER — Other Ambulatory Visit: Payer: Medicaid Other

## 2017-10-04 ENCOUNTER — Encounter (HOSPITAL_COMMUNITY): Payer: Self-pay

## 2017-10-04 ENCOUNTER — Other Ambulatory Visit: Payer: Self-pay | Admitting: Neurology

## 2017-10-04 ENCOUNTER — Ambulatory Visit (HOSPITAL_COMMUNITY): Payer: Self-pay

## 2017-10-05 ENCOUNTER — Telehealth: Payer: Self-pay | Admitting: Neurology

## 2017-10-05 NOTE — Telephone Encounter (Signed)
Meagan [name],  She called on Saturday asking for a refill of her nortriptyline.   She is a few months pregnant so I told her probably better not to be on nortriptyline.    I let her know someone will call her on Monday or she can call to see about getting her in for a visit.

## 2017-10-06 NOTE — Telephone Encounter (Signed)
Patient hasn't been seen in a year. She needs to come back and discuss migraine management in pregnancy before we prescribe any medication. She can see myself or carolyn or Megan. Please leave message for her to call the office and schedule an appointment. Thanks.

## 2017-10-07 ENCOUNTER — Encounter: Payer: Self-pay | Admitting: Neurology

## 2017-10-07 ENCOUNTER — Ambulatory Visit: Payer: Medicaid Other | Admitting: Neurology

## 2017-10-07 VITALS — BP 119/67 | HR 118 | Ht 62.0 in | Wt 175.0 lb

## 2017-10-07 DIAGNOSIS — G43009 Migraine without aura, not intractable, without status migrainosus: Secondary | ICD-10-CM | POA: Diagnosis not present

## 2017-10-07 MED ORDER — NORTRIPTYLINE HCL 25 MG PO CAPS: 25.0000 mg | ORAL_CAPSULE | Freq: Every day | ORAL | 4 refills | Status: DC

## 2017-10-07 NOTE — Telephone Encounter (Signed)
Schedule pt for appt today 4/8 check in at 3 for a 3:30

## 2017-10-07 NOTE — Patient Instructions (Signed)
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
## 2017-10-14 ENCOUNTER — Encounter: Payer: Self-pay | Admitting: Obstetrics & Gynecology

## 2017-10-14 ENCOUNTER — Encounter: Payer: Self-pay | Admitting: Advanced Practice Midwife

## 2017-10-14 ENCOUNTER — Ambulatory Visit (INDEPENDENT_AMBULATORY_CARE_PROVIDER_SITE_OTHER): Payer: Medicaid Other | Admitting: Obstetrics & Gynecology

## 2017-10-14 ENCOUNTER — Encounter: Payer: Self-pay | Admitting: *Deleted

## 2017-10-14 ENCOUNTER — Other Ambulatory Visit: Payer: Self-pay

## 2017-10-14 ENCOUNTER — Telehealth: Payer: Self-pay | Admitting: *Deleted

## 2017-10-14 ENCOUNTER — Telehealth: Payer: Self-pay | Admitting: Obstetrics & Gynecology

## 2017-10-14 VITALS — BP 136/80 | HR 100 | Wt 176.0 lb

## 2017-10-14 DIAGNOSIS — M545 Low back pain: Secondary | ICD-10-CM

## 2017-10-14 DIAGNOSIS — Z3A24 24 weeks gestation of pregnancy: Secondary | ICD-10-CM | POA: Diagnosis not present

## 2017-10-14 DIAGNOSIS — O2342 Unspecified infection of urinary tract in pregnancy, second trimester: Secondary | ICD-10-CM

## 2017-10-14 DIAGNOSIS — R319 Hematuria, unspecified: Secondary | ICD-10-CM | POA: Diagnosis not present

## 2017-10-14 DIAGNOSIS — B9689 Other specified bacterial agents as the cause of diseases classified elsewhere: Secondary | ICD-10-CM | POA: Diagnosis not present

## 2017-10-14 DIAGNOSIS — Z1389 Encounter for screening for other disorder: Secondary | ICD-10-CM

## 2017-10-14 DIAGNOSIS — Z331 Pregnant state, incidental: Secondary | ICD-10-CM

## 2017-10-14 DIAGNOSIS — Z3402 Encounter for supervision of normal first pregnancy, second trimester: Secondary | ICD-10-CM

## 2017-10-14 DIAGNOSIS — O9989 Other specified diseases and conditions complicating pregnancy, childbirth and the puerperium: Secondary | ICD-10-CM

## 2017-10-14 LAB — POCT URINALYSIS DIPSTICK
Glucose, UA: NEGATIVE
Ketones, UA: NEGATIVE
Leukocytes, UA: NEGATIVE
Nitrite, UA: POSITIVE

## 2017-10-14 MED ORDER — CEPHALEXIN 500 MG PO CAPS: 500.0000 mg | ORAL_CAPSULE | Freq: Four times a day (QID) | ORAL | 0 refills | Status: DC

## 2017-10-14 MED ORDER — CEFTRIAXONE SODIUM 250 MG IJ SOLR
1000.0000 mg | Freq: Once | INTRAMUSCULAR | Status: AC
  Administered 2017-10-14: 1000 mg via INTRAMUSCULAR

## 2017-10-14 NOTE — Progress Notes (Signed)
Chief Complaint  Patient presents with  . Back Pain    bladder contrctions      26 y.o. [id number] Patient's last menstrual period was 04/26/2017 (approximate). The current method of family planning is pt is pregnant.  Outpatient Encounter Medications as of 10/14/2017  Medication Sig  . acetaminophen (TYLENOL) 500 MG tablet Take 1,000 mg by mouth every 6 (six) hours as needed for moderate pain.  Marland Kitchen. albuterol (PROVENTIL HFA;VENTOLIN HFA) 108 (90 Base) MCG/ACT inhaler Inhale 1-2 puffs into the lungs every 4 (four) hours as needed.  . ALPRAZolam (XANAX) 0.5 MG tablet Take 1 tablet (0.5 mg total) by mouth 3 (three) times daily as needed. for anxiety  . aspirin EC 81 MG tablet Take 2 tablets (162 mg total) by mouth daily.  . nortriptyline (PAMELOR) 25 MG capsule Take 1 capsule (25 mg total) by mouth at bedtime.  Marland Kitchen. omeprazole (PRILOSEC) 20 MG capsule Take 1 capsule (20 mg total) by mouth daily.  . Prenatal Vit-Fe Fumarate-FA (PNV PRENATAL PLUS MULTIVITAMIN) 27-1 MG TABS Take 1 tablet by mouth daily.  . cephALEXin (KEFLEX) 500 MG capsule Take 1 capsule (500 mg total) by mouth 4 (four) times daily.   Facility-Administered Encounter Medications as of 10/14/2017  Medication  . miconazole (MONISTAT 7) 2 % vaginal cream 1 Applicatorful    Subjective Meagan [name] is in complaining of 1 days history of low back pain, crampy achy, not CVAT, no fever, + bladder contractions and pain with urination Has worsened since symptoms began Past Medical History:  Diagnosis Date  . Abdominal cramps 12/06/2015  . Anxiety   . Asthma    inhaler at bedside  . Daily headache   . Diarrhea 06/04/2013  . Hemiplegic migraine    blurred vision, nausea, dizziness, numbness  . Kidney stones   . Mental disorder    huffed inhalants "affected brain"  . Nausea 06/04/2013  . Nausea and vomiting during pregnancy 12/06/2015  . Pregnant 06/04/2013  . Pregnant 10/17/2015  . Pyelonephritis complicating pregnancy,  antepartum, first trimester 11/05/2011  . Sore throat 06/04/2013  . Stomach ulcer     Past Surgical History:  Procedure Laterality Date  . ANKLE SURGERY Left 09/2016   plates & screws  . no past surgery      OB History    Gravida  4   Para  3   Term  3   Preterm  0   AB  0   Living  3     SAB  0   TAB  0   Ectopic  0   Multiple  0   Live Births  3           Allergies  Allergen Reactions  . Codeine Nausea And Vomiting and Other (See Comments)    Vertigo  . Penicillins Rash and Other (See Comments)    Unknown:  Childhood reaction.  Has patient had a PCN reaction causing immediate rash, facial/tongue/throat swelling, SOB or lightheadedness with hypotension: Unknown Has patient had a PCN reaction causing severe rash involving mucus membranes or skin necrosis: Unknown Has patient had a PCN reaction that required hospitalization No Has patient had a PCN reaction occurring within the last 10 years: No If all of the above answers are "NO", then may pro  . Sulfamethoxazole-Trimethoprim Hives    Social History   Socioeconomic History  . Marital status: Legally Separated    Spouse name: Not on file  . Number of children:  3  . Years of education: Not on file  . Highest education level: GED or equivalent  Occupational History  . Not on file  Social Needs  . Financial resource strain: Not on file  . Food insecurity:    Worry: Not on file    Inability: Not on file  . Transportation needs:    Medical: Not on file    Non-medical: Not on file  Tobacco Use  . Smoking status: Current Every Day Smoker    Packs/day: 1.00    Years: 7.00    Pack years: 7.00    Types: Cigarettes  . Smokeless tobacco: Never Used  . Tobacco comment: current smoker 06/17/2016 2300  Substance and Sexual Activity  . Alcohol use: No    Alcohol/week: 0.0 oz    Frequency: Never    Comment: occ; not now  . Drug use: No    Types: Other-see comments    Comment: huffed inhalants in  past, "affected Brain"  . Sexual activity: Yes    Birth control/protection: None  Lifestyle  . Physical activity:    Days per week: Not on file    Minutes per session: Not on file  . Stress: Not on file  Relationships  . Social connections:    Talks on phone: Not on file    Gets together: Not on file    Attends religious service: Not on file    Active member of club or organization: Not on file    Attends meetings of clubs or organizations: Not on file    Relationship status: Not on file  Other Topics Concern  . Not on file  Social History Narrative   Lives at home with her children   Right handed.   Six sodas per day.    Family History  Problem Relation Age of Onset  . Cancer Maternal Grandmother        breast  . Cancer Paternal Grandfather        lung cancer  . Migraines Father   . Hypertension Father   . Hypercholesterolemia Father   . Other Son        ventricular defect  . Hypertension Other     Medications:       Current Outpatient Medications:  .  acetaminophen (TYLENOL) 500 MG tablet, Take 1,000 mg by mouth every 6 (six) hours as needed for moderate pain., Disp: , Rfl:  .  albuterol (PROVENTIL HFA;VENTOLIN HFA) 108 (90 Base) MCG/ACT inhaler, Inhale 1-2 puffs into the lungs every 4 (four) hours as needed., Disp: , Rfl:  .  ALPRAZolam (XANAX) 0.5 MG tablet, Take 1 tablet (0.5 mg total) by mouth 3 (three) times daily as needed. for anxiety, Disp: 90 tablet, Rfl: 3 .  aspirin EC 81 MG tablet, Take 2 tablets (162 mg total) by mouth daily., Disp: 60 tablet, Rfl: 6 .  nortriptyline (PAMELOR) 25 MG capsule, Take 1 capsule (25 mg total) by mouth at bedtime., Disp: 90 capsule, Rfl: 4 .  omeprazole (PRILOSEC) 20 MG capsule, Take 1 capsule (20 mg total) by mouth daily., Disp: 30 capsule, Rfl: 5 .  Prenatal Vit-Fe Fumarate-FA (PNV PRENATAL PLUS MULTIVITAMIN) 27-1 MG TABS, Take 1 tablet by mouth daily., Disp: 30 tablet, Rfl: 11 .  cephALEXin (KEFLEX) 500 MG capsule, Take 1  capsule (500 mg total) by mouth 4 (four) times daily., Disp: 28 capsule, Rfl: 0  Current Facility-Administered Medications:  .  miconazole (MONISTAT 7) 2 % vaginal cream 1 Applicatorful, 1 Applicatorful, Vaginal,  Creed Copper, MD  Objective Blood pressure 136/80, pulse 100, weight 176 lb (79.8 kg), last menstrual period 04/26/2017, not currently breastfeeding.  No CVAT on exam Abdomen soft non tender FHT 150 Gravid FH 26 cm  Pertinent ROS No burning with urination, frequency or urgency No nausea, vomiting or diarrhea Nor fever chills or other constitutional symptoms   Labs or studies UA + nitrites    Impression Diagnoses this Encounter::   ICD-10-CM   1. UTI (urinary tract infection) during pregnancy, second trimester O23.42   2. Encounter for supervision of normal first pregnancy in second trimester Z34.02   3. Pregnant state, incidental Z33.1 POCT urinalysis dipstick  4. Screening for genitourinary condition Z13.89 POCT urinalysis dipstick  5. Hematuria, unspecified type R31.9 Urine Culture    Established relevant diagnosis(es):   Plan/Recommendations: Meds ordered this encounter  Medications  . cephALEXin (KEFLEX) 500 MG capsule    Sig: Take 1 capsule (500 mg total) by mouth 4 (four) times daily.    Dispense:  28 capsule    Refill:  0    Labs or Scans Ordered: Orders Placed This Encounter  Procedures  . Urine Culture  . POCT urinalysis dipstick    Management:: rocepin 1 gram IM given in office Keflex 500 QID(pt has taken before) Keep scheduled OB appt  Follow up Return for keep .       All questions were answered.

## 2017-10-14 NOTE — Telephone Encounter (Signed)
Patient called stating that she just left the office and forgot to ask Dr. Despina HiddenEure when should she start the antibiotics after she has gotten her shot. Please contact pt

## 2017-10-14 NOTE — Telephone Encounter (Signed)
Patient states she is still having "pain above her kidney" and doesn't know what to do.  Informed patient that she was given a gram of Rocephin and an oral antibiotic so it she should start feeling better soon.  Advised if she started running a fever, having body aches or chills, to go [facility] with her history of pyelo x2.  Verbalized understanding.

## 2017-10-14 NOTE — Telephone Encounter (Signed)
Pt states that she is having a lot of braxton hicks contractions. Pt reports that she has a lot of pelvic pressure. She has tried resting for a while but it is not helping. No bleeding or spotting. She reports that baby is moving well. Pt is also having uti symptoms. Pt to be worked in today to be seen.

## 2017-10-14 NOTE — Progress Notes (Signed)
Pt given rocephin 1g IM left VG without complications. Observed for after injection with no reaction.

## 2017-10-14 NOTE — Addendum Note (Signed)
Addended by: Tish FredericksonLANCASTER, Octavion Mollenkopf A on: 10/14/2017 03:33 PM   Modules accepted: Orders

## 2017-10-16 ENCOUNTER — Ambulatory Visit (INDEPENDENT_AMBULATORY_CARE_PROVIDER_SITE_OTHER): Payer: Medicaid Other | Admitting: Advanced Practice Midwife

## 2017-10-16 ENCOUNTER — Encounter: Payer: Self-pay | Admitting: Advanced Practice Midwife

## 2017-10-16 VITALS — BP 100/70 | HR 98 | Wt 178.0 lb

## 2017-10-16 DIAGNOSIS — O09299 Supervision of pregnancy with other poor reproductive or obstetric history, unspecified trimester: Secondary | ICD-10-CM

## 2017-10-16 DIAGNOSIS — Z3402 Encounter for supervision of normal first pregnancy, second trimester: Secondary | ICD-10-CM

## 2017-10-16 DIAGNOSIS — O9989 Other specified diseases and conditions complicating pregnancy, childbirth and the puerperium: Secondary | ICD-10-CM

## 2017-10-16 DIAGNOSIS — G43409 Hemiplegic migraine, not intractable, without status migrainosus: Secondary | ICD-10-CM

## 2017-10-16 DIAGNOSIS — Z3A25 25 weeks gestation of pregnancy: Secondary | ICD-10-CM

## 2017-10-16 DIAGNOSIS — Z3482 Encounter for supervision of other normal pregnancy, second trimester: Secondary | ICD-10-CM

## 2017-10-16 DIAGNOSIS — Z331 Pregnant state, incidental: Secondary | ICD-10-CM

## 2017-10-16 DIAGNOSIS — O09292 Supervision of pregnancy with other poor reproductive or obstetric history, second trimester: Secondary | ICD-10-CM

## 2017-10-16 DIAGNOSIS — Z1389 Encounter for screening for other disorder: Secondary | ICD-10-CM

## 2017-10-16 LAB — POCT URINALYSIS DIPSTICK
Blood, UA: NEGATIVE
Glucose, UA: NEGATIVE
Ketones, UA: NEGATIVE
Leukocytes, UA: NEGATIVE
Nitrite, UA: NEGATIVE

## 2017-10-16 LAB — URINE CULTURE

## 2017-10-16 NOTE — Patient Instructions (Signed)

## 2017-10-16 NOTE — Progress Notes (Signed)
  [id number] 5441w5d Estimated Date of Delivery: 01/31/18  Blood pressure 100/70, pulse 98, weight 178 lb (80.7 kg), last menstrual period 04/26/2017, not currently breastfeeding.   BP weight and urine results all reviewed and noted.  Please refer to the obstetrical flow sheet for the fundal height and fetal heart rate documentation:  Patient reports good fetal movement, denies any bleeding and no rupture of membranes symptoms or regular contractions. Patient is without complaints. She saw her neuro MD last week, touching base about HAs .  She continued her Nortryptaline and this is working.    ? Right Clubfoot at 23 weeks vs positional (leaning toward positional)  Will f/u w/next growth US (next Friday).   Feeling much better w/UTI.    All questions were answered.   Physical Assessment:   Vitals:   10/16/17 1329  BP: 100/70  Pulse: 98  Weight: 178 lb (80.7 kg)  Body mass index is 32.56 kg/m.        Physical Examination:   General appearance: Well appearing, and in no distress  Mental status: Alert, oriented to person, place, and time  Skin: Warm & dry  Cardiovascular: Normal heart rate noted  Respiratory: Normal respiratory effort, no distress  Abdomen: Soft, gravid, nontender  Pelvic: Cervical exam deferred         Extremities: Edema: Trace  Fetal Status: Fetal Heart Rate (bpm): 149   Movement: Present    Results for orders placed or performed in visit on 10/16/17 (from the past 24 hour(s))  POCT urinalysis dipstick   Collection Time: 10/16/17  1:37 PM  Result Value Ref Range   Color, UA     Clarity, UA     Glucose, UA neg    Bilirubin, UA     Ketones, UA neg    Spec Grav, UA  1.010 - 1.025   Blood, UA neg    pH, UA  5.0 - 8.0   Protein, UA trace    Urobilinogen, UA  0.2 or 1.0 E.U./dL   Nitrite, UA neg    Leukocytes, UA Negative Negative   Appearance     Odor       Orders Placed This Encounter  Procedures  . POCT urinalysis dipstick    Plan:  Continued  routine obstetrical care, Has Growth US (f/U short long bones/?clubfoot) next Friday  Return in about 3 weeks (around 11/06/2017) for PN2/LROB w/JVF.

## 2017-10-22 ENCOUNTER — Other Ambulatory Visit (HOSPITAL_COMMUNITY): Payer: Self-pay | Admitting: Advanced Practice Midwife

## 2017-10-22 DIAGNOSIS — O35GXX0 Maternal care for other (suspected) fetal abnormality and damage, fetal upper extremities anomalies, not applicable or unspecified: Secondary | ICD-10-CM

## 2017-10-22 DIAGNOSIS — O358XX0 Maternal care for other (suspected) fetal abnormality and damage, not applicable or unspecified: Secondary | ICD-10-CM

## 2017-10-22 DIAGNOSIS — O35HXX0 Maternal care for other (suspected) fetal abnormality and damage, fetal lower extremities anomalies, not applicable or unspecified: Secondary | ICD-10-CM

## 2017-10-23 ENCOUNTER — Ambulatory Visit (INDEPENDENT_AMBULATORY_CARE_PROVIDER_SITE_OTHER): Payer: Medicaid Other

## 2017-10-23 DIAGNOSIS — F17210 Nicotine dependence, cigarettes, uncomplicated: Secondary | ICD-10-CM

## 2017-10-23 DIAGNOSIS — O99342 Other mental disorders complicating pregnancy, second trimester: Secondary | ICD-10-CM

## 2017-10-23 DIAGNOSIS — O99332 Smoking (tobacco) complicating pregnancy, second trimester: Secondary | ICD-10-CM | POA: Diagnosis not present

## 2017-10-23 DIAGNOSIS — O35HXX0 Maternal care for other (suspected) fetal abnormality and damage, fetal lower extremities anomalies, not applicable or unspecified: Secondary | ICD-10-CM

## 2017-10-23 DIAGNOSIS — O35GXX0 Maternal care for other (suspected) fetal abnormality and damage, fetal upper extremities anomalies, not applicable or unspecified: Secondary | ICD-10-CM

## 2017-10-23 DIAGNOSIS — Z843 Family history of consanguinity: Secondary | ICD-10-CM

## 2017-10-23 DIAGNOSIS — Z3A25 25 weeks gestation of pregnancy: Secondary | ICD-10-CM

## 2017-10-23 DIAGNOSIS — Z364 Encounter for antenatal screening for fetal growth retardation: Secondary | ICD-10-CM

## 2017-10-23 DIAGNOSIS — F419 Anxiety disorder, unspecified: Secondary | ICD-10-CM | POA: Diagnosis not present

## 2017-10-23 DIAGNOSIS — Z3402 Encounter for supervision of normal first pregnancy, second trimester: Secondary | ICD-10-CM

## 2017-10-23 DIAGNOSIS — O358XX0 Maternal care for other (suspected) fetal abnormality and damage, not applicable or unspecified: Secondary | ICD-10-CM | POA: Diagnosis not present

## 2017-10-23 NOTE — Progress Notes (Signed)
US 25+5 wks,breech,cx 3.5 cm,anterior pl gr 0,feet appear to have normal foot alignment,normal ovaries bilat,afi 16 cm,fhr 152 bpm,EFW 735 g 11%,shortened upper and lower extremities

## 2017-11-05 ENCOUNTER — Other Ambulatory Visit: Payer: Medicaid Other

## 2017-11-05 ENCOUNTER — Encounter: Payer: Medicaid Other | Admitting: Obstetrics & Gynecology

## 2017-11-14 ENCOUNTER — Other Ambulatory Visit: Payer: Medicaid Other

## 2017-11-14 ENCOUNTER — Encounter: Payer: Self-pay | Admitting: Women's Health

## 2017-11-14 ENCOUNTER — Ambulatory Visit (INDEPENDENT_AMBULATORY_CARE_PROVIDER_SITE_OTHER): Payer: Medicaid Other | Admitting: Women's Health

## 2017-11-14 VITALS — BP 120/80 | HR 94 | Wt 181.4 lb

## 2017-11-14 DIAGNOSIS — Z3483 Encounter for supervision of other normal pregnancy, third trimester: Secondary | ICD-10-CM

## 2017-11-14 DIAGNOSIS — Z331 Pregnant state, incidental: Secondary | ICD-10-CM

## 2017-11-14 DIAGNOSIS — Z364 Encounter for antenatal screening for fetal growth retardation: Secondary | ICD-10-CM

## 2017-11-14 DIAGNOSIS — Z3A28 28 weeks gestation of pregnancy: Secondary | ICD-10-CM

## 2017-11-14 DIAGNOSIS — O283 Abnormal ultrasonic finding on antenatal screening of mother: Secondary | ICD-10-CM

## 2017-11-14 DIAGNOSIS — Z131 Encounter for screening for diabetes mellitus: Secondary | ICD-10-CM

## 2017-11-14 DIAGNOSIS — O26843 Uterine size-date discrepancy, third trimester: Secondary | ICD-10-CM

## 2017-11-14 DIAGNOSIS — Z1389 Encounter for screening for other disorder: Secondary | ICD-10-CM

## 2017-11-14 LAB — POCT URINALYSIS DIPSTICK
Blood, UA: NEGATIVE
Glucose, UA: NEGATIVE
Ketones, UA: NEGATIVE
Leukocytes, UA: NEGATIVE
Nitrite, UA: NEGATIVE

## 2017-11-14 NOTE — Patient Instructions (Signed)
Meagan [name], I greatly value your feedback.  If you receive a survey following your visit with [name] today, we appreciate you taking the time to fill it out.  Thanks, [name], CNM, WHNP-BC   Call the office [phone]) or go to [facility] if:  You begin to have strong, frequent contractions  Your water breaks.  Sometimes it is a big gush of fluid, sometimes it is just a trickle that keeps getting your panties wet or running down your legs  You have vaginal bleeding.  It is normal to have a small amount of spotting if your cervix was checked.   You don't feel your baby moving like normal.  If you don't, get you something to eat and drink and lay down and focus on feeling your baby move.  You should feel at least 10 movements in 2 hours.  If you don't, you should call the office or go to [facility].    Tdap Vaccine  It is recommended that you get the Tdap vaccine during the third trimester of EACH pregnancy to help protect your baby from getting pertussis (whooping cough)  27-36 weeks is the BEST time to do this so that you can pass the protection on to your baby. During pregnancy is better than after pregnancy, but if you are unable to get it during pregnancy it will be offered at the hospital.   You can get this vaccine at the health department or your family doctor  Everyone who will be around your baby should also be up-to-date on their vaccines. Adults (who are not pregnant) only need 1 dose of Tdap during adulthood.   Third Trimester of Pregnancy The third trimester is from week 29 through week 42, months 7 through 9. The third trimester is a time when the fetus is growing rapidly. At the end of the ninth month, the fetus is about 20 inches in length and weighs 6-10 pounds.  BODY CHANGES Your body goes through many changes during pregnancy. The changes vary from woman to woman.   Your weight will continue to increase. You can expect to gain 25-35 pounds (11-16 kg)  by the end of the pregnancy.  You may begin to get stretch marks on your hips, abdomen, and breasts.  You may urinate more often because the fetus is moving lower into your pelvis and pressing on your bladder.  You may develop or continue to have heartburn as a result of your pregnancy.  You may develop constipation because certain hormones are causing the muscles that push waste through your intestines to slow down.  You may develop hemorrhoids or swollen, bulging veins (varicose veins).  You may have pelvic pain because of the weight gain and pregnancy hormones relaxing your joints between the bones in your pelvis. Backaches may result from overexertion of the muscles supporting your posture.  You may have changes in your hair. These can include thickening of your hair, rapid growth, and changes in texture. Some women also have hair loss during or after pregnancy, or hair that feels dry or thin. Your hair will most likely return to normal after your baby is born.  Your breasts will continue to grow and be tender. A yellow discharge may leak from your breasts called colostrum.  Your belly button may stick out.  You may feel short of breath because of your expanding uterus.  You may notice the fetus "dropping," or moving lower in your abdomen.  You may have a bloody  mucus discharge. This usually occurs a few days to a week before labor begins.  Your cervix becomes thin and soft (effaced) near your due date. WHAT TO EXPECT AT YOUR PRENATAL EXAMS  You will have prenatal exams every 2 weeks until week 36. Then, you will have weekly prenatal exams. During a routine prenatal visit:  You will be weighed to make sure you and the fetus are growing normally.  Your blood pressure is taken.  Your abdomen will be measured to track your baby's growth.  The fetal heartbeat will be listened to.  Any test results from the previous visit will be discussed.  You may have a cervical check near  your due date to see if you have effaced. At around 36 weeks, your caregiver will check your cervix. At the same time, your caregiver will also perform a test on the secretions of the vaginal tissue. This test is to determine if a type of bacteria, Group B streptococcus, is present. Your caregiver will explain this further. Your caregiver may ask you:  What your birth plan is.  How you are feeling.  If you are feeling the baby move.  If you have had any abnormal symptoms, such as leaking fluid, bleeding, severe headaches, or abdominal cramping.  If you have any questions. Other tests or screenings that may be performed during your third trimester include:  Blood tests that check for low iron levels (anemia).  Fetal testing to check the health, activity level, and growth of the fetus. Testing is done if you have certain medical conditions or if there are problems during the pregnancy. FALSE LABOR You may feel small, irregular contractions that eventually go away. These are called Braxton Hicks contractions, or false labor. Contractions may last for hours, days, or even weeks before true labor sets in. If contractions come at regular intervals, intensify, or become painful, it is best to be seen by your caregiver.  SIGNS OF LABOR   Menstrual-like cramps.  Contractions that are 5 minutes apart or less.  Contractions that start on the top of the uterus and spread down to the lower abdomen and back.  A sense of increased pelvic pressure or back pain.  A watery or bloody mucus discharge that comes from the vagina. If you have any of these signs before the 37th week of pregnancy, call your caregiver right away. You need to go to the hospital to get checked immediately. HOME CARE INSTRUCTIONS   Avoid all smoking, herbs, alcohol, and unprescribed drugs. These chemicals affect the formation and growth of the baby.  Follow your caregiver's instructions regarding medicine use. There are  medicines that are either safe or unsafe to take during pregnancy.  Exercise only as directed by your caregiver. Experiencing uterine cramps is a good sign to stop exercising.  Continue to eat regular, healthy meals.  Wear a good support bra for breast tenderness.  Do not use hot tubs, steam rooms, or saunas.  Wear your seat belt at all times when driving.  Avoid raw meat, uncooked cheese, cat litter boxes, and soil used by cats. These carry germs that can cause birth defects in the baby.  Take your prenatal vitamins.  Try taking a stool softener (if your caregiver approves) if you develop constipation. Eat more high-fiber foods, such as fresh vegetables or fruit and whole grains. Drink plenty of fluids to keep your urine clear or pale yellow.  Take warm sitz baths to soothe any pain or discomfort caused by hemorrhoids.  Use hemorrhoid cream if your caregiver approves.  If you develop varicose veins, wear support hose. Elevate your feet for 15 minutes, 3-4 times a day. Limit salt in your diet.  Avoid heavy lifting, wear low heal shoes, and practice good posture.  Rest a lot with your legs elevated if you have leg cramps or low back pain.  Visit your dentist if you have not gone during your pregnancy. Use a soft toothbrush to brush your teeth and be gentle when you floss.  A sexual relationship may be continued unless your caregiver directs you otherwise.  Do not travel far distances unless it is absolutely necessary and only with the approval of your caregiver.  Take prenatal classes to understand, practice, and ask questions about the labor and delivery.  Make a trial run to the hospital.  Pack your hospital bag.  Prepare the baby's nursery.  Continue to go to all your prenatal visits as directed by your caregiver. SEEK MEDICAL CARE IF:  You are unsure if you are in labor or if your water has broken.  You have dizziness.  You have mild pelvic cramps, pelvic pressure, or  nagging pain in your abdominal area.  You have persistent nausea, vomiting, or diarrhea.  You have a bad smelling vaginal discharge.  You have pain with urination. SEEK IMMEDIATE MEDICAL CARE IF:   You have a fever.  You are leaking fluid from your vagina.  You have spotting or bleeding from your vagina.  You have severe abdominal cramping or pain.  You have rapid weight loss or gain.  You have shortness of breath with chest pain.  You notice sudden or extreme swelling of your face, hands, ankles, feet, or legs.  You have not felt your baby move in over an hour.  You have severe headaches that do not go away with medicine.  You have vision changes. Document Released: 06/12/2001 Document Revised: 06/23/2013 Document Reviewed: 08/19/2012 [facility] Patient Information 2015 [place], [place]. This information is not intended to replace advice given to you by your health care provider. Make sure you discuss any questions you have with your health care provider.

## 2017-11-14 NOTE — Progress Notes (Signed)
   LOW-RISK PREGNANCY VISIT Patient name: Meagan [name] MRN [id number]  Date of birth: 28-Dec-1990 Chief Complaint:   low risk pregnancy (rash on arms and chest area)  History of Present Illness:   Meagan [name] is a 27 y.o. [phone] female at [redacted]w[redacted]d with an Estimated Date of Delivery: 01/31/18 being seen today for ongoing management of a low-risk pregnancy.  Today she reports itchy rash chest/arms x 5months. No one else in house has it, no pets in house other than rabbits just got 1wk ago. Hasn't tried anything. Contractions: Irregular.  .  Movement: Present. denies leaking of fluid. Review of Systems:   Pertinent items are noted in HPI Denies abnormal vaginal discharge w/ itching/odor/irritation, headaches, visual changes, shortness of breath, chest pain, abdominal pain, severe nausea/vomiting, or problems with urination or bowel movements unless otherwise stated above. Pertinent History Reviewed:  Reviewed past medical,surgical, social, obstetrical and family history.  Reviewed problem list, medications and allergies. Physical Assessment:   Vitals:   11/14/17 1001  BP: 120/80  Pulse: 94  Weight: 181 lb 6.4 oz (82.3 kg)  Body mass index is 33.18 kg/m.        Physical Examination:   General appearance: Well appearing, and in no distress  Mental status: Alert, oriented to person, place, and time  Skin: Warm & dry, small scattered red bumps arms/chest  Cardiovascular: Normal heart rate noted  Respiratory: Normal respiratory effort, no distress  Abdomen: Soft, gravid, nontender  Pelvic: Cervical exam deferred         Extremities: Edema: None  Fetal Status: Fetal Heart Rate (bpm): 145 Fundal Height: 32 cm Movement: Present    Results for orders placed or performed in visit on 11/14/17 (from the past 24 hour(s))  POCT urinalysis dipstick   Collection Time: 11/14/17 10:04 AM  Result Value Ref Range   Color, UA     Clarity, UA     Glucose, UA neg    Bilirubin, UA     Ketones, UA neg    Spec Grav, UA  1.010 - 1.025   Blood, UA neg    pH, UA  5.0 - 8.0   Protein, UA trace    Urobilinogen, UA  0.2 or 1.0 E.U./dL   Nitrite, UA neg    Leukocytes, UA Negative Negative   Appearance     Odor      Assessment & Plan:  1) Low-risk pregnancy G4P3003 at [redacted]w[redacted]d with an Estimated Date of Delivery: 01/31/18   2) Skin rash, try hydrocortisone cream, avoid hot showers/baths, cool compresses, benadryl po  3) 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  Labs/procedures today: pn2, pt wants to get tdap w/ rhogam  Plan:  Continue routine obstetrical care   Reviewed: Preterm labor symptoms and general obstetric precautions including but not limited to vaginal bleeding, contractions, leaking of fluid and fetal movement were reviewed in detail with the patient. Recommended Tdap at HD/PCP per CDC guidelines.  All questions were answered  Follow-up: Return for next week for , US:EFW & tdap/rhogam (no visit), then 4wks for LROB .  Orders Placed This Encounter  Procedures  . US OB Follow Up  . POCT urinalysis dipstick   [name] CNM, [facility] 11/14/2017 10:46 AM

## 2017-11-15 LAB — ANTIBODY SCREEN: Antibody Screen: NEGATIVE

## 2017-11-15 LAB — CBC
Hematocrit: 34.3 % (ref 34.0–46.6)
Hemoglobin: 11.8 g/dL (ref 11.1–15.9)
MCH: 32.5 pg (ref 26.6–33.0)
MCHC: 34.4 g/dL (ref 31.5–35.7)
MCV: 95 fL (ref 79–97)
Platelets: 212 10*3/uL (ref 150–379)
RBC: 3.63 x10E6/uL — ABNORMAL LOW (ref 3.77–5.28)
RDW: 14.5 % (ref 12.3–15.4)
WBC: 10 10*3/uL (ref 3.4–10.8)

## 2017-11-15 LAB — RPR: RPR Ser Ql: NONREACTIVE

## 2017-11-15 LAB — GLUCOSE TOLERANCE, 2 HOURS W/ 1HR
Glucose, 1 hour: 132 mg/dL (ref 65–179)
Glucose, 2 hour: 102 mg/dL (ref 65–152)
Glucose, Fasting: 66 mg/dL (ref 65–91)

## 2017-11-15 LAB — HIV ANTIBODY (ROUTINE TESTING W REFLEX): HIV Screen 4th Generation wRfx: NONREACTIVE

## 2017-11-18 ENCOUNTER — Ambulatory Visit (INDEPENDENT_AMBULATORY_CARE_PROVIDER_SITE_OTHER): Payer: Medicaid Other

## 2017-11-18 VITALS — BP 110/60 | HR 98 | Wt 185.4 lb

## 2017-11-18 DIAGNOSIS — Z3A30 30 weeks gestation of pregnancy: Secondary | ICD-10-CM

## 2017-11-18 DIAGNOSIS — Z23 Encounter for immunization: Secondary | ICD-10-CM | POA: Diagnosis not present

## 2017-11-18 DIAGNOSIS — Z364 Encounter for antenatal screening for fetal growth retardation: Secondary | ICD-10-CM

## 2017-11-18 DIAGNOSIS — Z1389 Encounter for screening for other disorder: Secondary | ICD-10-CM

## 2017-11-18 DIAGNOSIS — O283 Abnormal ultrasonic finding on antenatal screening of mother: Secondary | ICD-10-CM | POA: Diagnosis not present

## 2017-11-18 DIAGNOSIS — O26843 Uterine size-date discrepancy, third trimester: Secondary | ICD-10-CM | POA: Diagnosis not present

## 2017-11-18 DIAGNOSIS — Z3A29 29 weeks gestation of pregnancy: Secondary | ICD-10-CM

## 2017-11-18 DIAGNOSIS — Z3483 Encounter for supervision of other normal pregnancy, third trimester: Secondary | ICD-10-CM

## 2017-11-18 DIAGNOSIS — Z331 Pregnant state, incidental: Secondary | ICD-10-CM

## 2017-11-18 DIAGNOSIS — Z843 Family history of consanguinity: Secondary | ICD-10-CM

## 2017-11-18 LAB — POCT URINALYSIS DIPSTICK
Blood, UA: NEGATIVE
Glucose, UA: NEGATIVE
Ketones, UA: NEGATIVE
Leukocytes, UA: NEGATIVE
Nitrite, UA: NEGATIVE
Protein, UA: NEGATIVE

## 2017-11-18 MED ORDER — RHO D IMMUNE GLOBULIN 1500 UNIT/2ML IJ SOSY
300.0000 ug | PREFILLED_SYRINGE | Freq: Once | INTRAMUSCULAR | Status: AC
  Administered 2017-11-18: 300 ug via INTRAMUSCULAR

## 2017-11-18 NOTE — Progress Notes (Addendum)
[name] 29+3 wks,cephalic,anterior pl gr 2,cx 4.3 cm,afi 24.5cm (borderline polyhydramnios),cx 4.3 cm,fhr 137 bpm,EFW 1335 g 27%,shortened extremity, FL 1%,HL <1%

## 2017-11-18 NOTE — Progress Notes (Signed)
Pt here for Rhogam injection IM Lt deltoid. RH negative. Tdap injection given Rt arm. Tolerated well.Pad CMA

## 2017-11-19 ENCOUNTER — Encounter: Payer: Self-pay | Admitting: Advanced Practice Midwife

## 2017-11-22 ENCOUNTER — Encounter: Payer: Self-pay | Admitting: Women's Health

## 2017-12-02 ENCOUNTER — Other Ambulatory Visit: Payer: Self-pay | Admitting: Women's Health

## 2017-12-02 DIAGNOSIS — IMO0001 Reserved for inherently not codable concepts without codable children: Secondary | ICD-10-CM

## 2017-12-02 DIAGNOSIS — O358XX0 Maternal care for other (suspected) fetal abnormality and damage, not applicable or unspecified: Secondary | ICD-10-CM

## 2017-12-06 ENCOUNTER — Encounter: Payer: Self-pay | Admitting: Obstetrics & Gynecology

## 2017-12-06 ENCOUNTER — Ambulatory Visit (INDEPENDENT_AMBULATORY_CARE_PROVIDER_SITE_OTHER): Payer: Medicaid Other

## 2017-12-06 ENCOUNTER — Ambulatory Visit (INDEPENDENT_AMBULATORY_CARE_PROVIDER_SITE_OTHER): Payer: Medicaid Other | Admitting: Obstetrics & Gynecology

## 2017-12-06 ENCOUNTER — Other Ambulatory Visit: Payer: Self-pay | Admitting: Women's Health

## 2017-12-06 VITALS — BP 117/75 | HR 94 | Wt 195.0 lb

## 2017-12-06 DIAGNOSIS — O358XX0 Maternal care for other (suspected) fetal abnormality and damage, not applicable or unspecified: Secondary | ICD-10-CM

## 2017-12-06 DIAGNOSIS — O409XX0 Polyhydramnios, unspecified trimester, not applicable or unspecified: Secondary | ICD-10-CM

## 2017-12-06 DIAGNOSIS — Z843 Family history of consanguinity: Secondary | ICD-10-CM

## 2017-12-06 DIAGNOSIS — Z3A32 32 weeks gestation of pregnancy: Secondary | ICD-10-CM

## 2017-12-06 DIAGNOSIS — Z3483 Encounter for supervision of other normal pregnancy, third trimester: Secondary | ICD-10-CM

## 2017-12-06 DIAGNOSIS — Z1389 Encounter for screening for other disorder: Secondary | ICD-10-CM

## 2017-12-06 DIAGNOSIS — Z364 Encounter for antenatal screening for fetal growth retardation: Secondary | ICD-10-CM

## 2017-12-06 DIAGNOSIS — IMO0001 Reserved for inherently not codable concepts without codable children: Secondary | ICD-10-CM

## 2017-12-06 DIAGNOSIS — Z331 Pregnant state, incidental: Secondary | ICD-10-CM

## 2017-12-06 DIAGNOSIS — O403XX0 Polyhydramnios, third trimester, not applicable or unspecified: Secondary | ICD-10-CM | POA: Diagnosis not present

## 2017-12-06 DIAGNOSIS — Z3403 Encounter for supervision of normal first pregnancy, third trimester: Secondary | ICD-10-CM

## 2017-12-06 LAB — POCT URINALYSIS DIPSTICK
Blood, UA: NEGATIVE
Glucose, UA: NEGATIVE
Ketones, UA: NEGATIVE
Nitrite, UA: NEGATIVE
Protein, UA: NEGATIVE

## 2017-12-06 NOTE — Progress Notes (Signed)
US 32 wks,cephalic,fhr 129 bpm,anterior pl gr 1,AFI 31 cm poly,cx 3.5 cm,efw 1841 g 33%,FL 1.2%,HL 7%,BPP 6/8 no breathing

## 2017-12-06 NOTE — Progress Notes (Signed)
   HIGH-RISK PREGNANCY VISIT Patient name: Meagan [name] MRN [id number]  Date of birth: 11/22/1990 Chief Complaint:   Routine Prenatal Visit (US today; swelling in ankles)  History of Present Illness:   Meagan [name] is a 27 y.o. [phone] female at 2583w0d with an Estimated Date of Delivery: 01/31/18 being seen today for ongoing management of a high-risk pregnancy complicated by polyhydramnios.  Today she reports swelling. Contractions: Irregular. Vag. Bleeding: None.  Movement: Present. denies leaking of fluid.  Review of Systems:   Pertinent items are noted in HPI Denies abnormal vaginal discharge w/ itching/odor/irritation, headaches, visual changes, shortness of breath, chest pain, abdominal pain, severe nausea/vomiting, or problems with urination or bowel movements unless otherwise stated above. Pertinent History Reviewed:  Reviewed past medical,surgical, social, obstetrical and family history.  Reviewed problem list, medications and allergies. Physical Assessment:   Vitals:   12/06/17 0853  BP: 117/75  Pulse: 94  Weight: 195 lb (88.5 kg)  Body mass index is 35.67 kg/m.           Physical Examination:   General appearance: alert, well appearing, and in no distress  Mental status: alert, oriented to person, place, and time  Skin: warm & dry   Extremities: Edema: Trace    Cardiovascular: normal heart rate noted  Respiratory: normal respiratory effort, no distress  Abdomen: gravid, soft, non-tender  Pelvic: Cervical exam deferred         Fetal Status:     Movement: Present    Fetal Surveillance Testing today: BPP 8/10(-2 breathing)   Results for orders placed or performed in visit on 12/06/17 (from the past 24 hour(s))  POCT urinalysis dipstick   Collection Time: 12/06/17  8:56 AM  Result Value Ref Range   Color, UA     Clarity, UA     Glucose, UA Negative Negative   Bilirubin, UA     Ketones, UA neg    Spec Grav, UA  1.010 - 1.025   Blood, UA neg    pH, UA   5.0 - 8.0   Protein, UA Negative Negative   Urobilinogen, UA  0.2 or 1.0 E.U./dL   Nitrite, UA neg    Leukocytes, UA Trace (A) Negative   Appearance     Odor      Assessment & Plan:  1) High-risk pregnancy G4P3003 at 2583w0d with an Estimated Date of Delivery: 01/31/18   2) Polyhydramnios, idiopathic moderate severity, stable  3) Borderline long bone length, stable  Meds: No orders of the defined types were placed in this encounter.   Labs/procedures today: BPP 6/8 + Reactive NST=8/10  Treatment Plan:  Twice weekly surveillance, 2 NST next week then sonogram AFI with BPP weekly if reassuring weekly will be adequate  Reviewed: Preterm labor symptoms and general obstetric precautions including but not limited to vaginal bleeding, contractions, leaking of fluid and fetal movement were reviewed in detail with the patient.  All questions were answered.  Follow-up: Return in about 4 days (around 12/10/2017) for NST, HROB.  Orders Placed This Encounter  Procedures  . POCT urinalysis dipstick   Lazaro ArmsLuther H Darwyn Ponzo  12/06/2017 9:37 AM

## 2017-12-09 ENCOUNTER — Encounter: Payer: Self-pay | Admitting: Women's Health

## 2017-12-09 ENCOUNTER — Ambulatory Visit (INDEPENDENT_AMBULATORY_CARE_PROVIDER_SITE_OTHER): Payer: Medicaid Other | Admitting: Obstetrics and Gynecology

## 2017-12-09 ENCOUNTER — Telehealth: Payer: Self-pay | Admitting: *Deleted

## 2017-12-09 VITALS — BP 125/75 | HR 110 | Wt 194.5 lb

## 2017-12-09 DIAGNOSIS — Z3483 Encounter for supervision of other normal pregnancy, third trimester: Secondary | ICD-10-CM

## 2017-12-09 DIAGNOSIS — O403XX0 Polyhydramnios, third trimester, not applicable or unspecified: Secondary | ICD-10-CM

## 2017-12-09 DIAGNOSIS — Z3A32 32 weeks gestation of pregnancy: Secondary | ICD-10-CM | POA: Diagnosis not present

## 2017-12-09 NOTE — Telephone Encounter (Signed)
Pt reports that she is experiencing a lot of pressure. She also has had a lot of braxton hicks contractions.  Denies any bleeding or fluid leaking. She reports good fetal movement. Patient also reports diarrhea yesterday. I discussed patient with Meagan DikeJennifer and she recommended bringing her in for a cervix check only and still have her come back for testing tomorrow. Patient transferred to front desk to schedule appt.

## 2017-12-09 NOTE — Progress Notes (Signed)
Patient ID: Meagan [name], female   DOB: 11/30/1990, 27 y.o.   MRN: [id number]    [facility] Patient name: Meagan [name] MRN [id number]  Date of birth: 01/27/1991 Chief Complaint:   Follow-up  History of Present Illness:   Meagan [name] is a 27 y.o. [phone] female at 1133w3d with an Estimated Date of Delivery: 01/31/18 being seen today for ongoing management of a high-risk pregnancy complicated by Polyhydramnios.  Today she reports no complaints. This is her forth pregnancy and is considered taking birth control pill but is unsure due to headaches and weight gain of last IUD insertion Pt takes xanax 3 times a day. Takes notriptyline for hemiplegic headches   Contractions: Irregular. Vag. Bleeding: None.  Movement: Present. denies leaking of fluid.  Review of Systems:   Pertinent items are noted in HPI Denies abnormal vaginal discharge w/ itching/odor/irritation, headaches, visual changes, shortness of breath, chest pain, abdominal pain, severe nausea/vomiting, or problems with urination or bowel movements unless otherwise stated above. Pertinent History Reviewed:  Reviewed past medical,surgical, social, obstetrical and family history.  Reviewed problem list, medications and allergies. Physical Assessment:   Vitals:   12/09/17 1142  BP: 125/75  Pulse: (!) 110  Weight: 194 lb 8 oz (88.2 kg)  Body mass index is 35.57 kg/m.           Physical Examination:   General appearance: alert, well appearing, and in no distress and oriented to person, place, and time  Mental status: alert, oriented to person, place, and time, normal mood, behavior, speech, dress, motor activity, and thought processes, affect appropriate to mood  Skin: warm & dry   Extremities: Edema: Trace    Cardiovascular: normal heart rate noted  Respiratory: normal respiratory effort, no distress  Abdomen: gravid, soft, non-tender  Pelvic: Cervical exam performed , normal secretions, long and  high 1/2 cm        Fetal Status:   Fundal Height: 35 cm Movement: Present    Fetal Surveillance Testing today: NST  No results found for this or any previous visit (from the past 24 hour(s)).  Assessment & Plan:  1) High-risk pregnancy G4P3003 at 4833w3d with an Estimated Date of Delivery: 01/31/18   2) Polyhydramnios, stable   Meds: No orders of the defined types were placed in this encounter.   Labs/procedures today: NST  Treatment Plan:  Contraception undecided, discussed option due to hemiplegic headaches will need progesterone only   Follow-up: Return in about 3 days (around 12/12/2017) for HROB, NST.  No orders of the defined types were placed in this encounter.   By signing my name below, I, Arnette NorrisMari Johnson, attest that this documentation has been prepared under the direction and in the presence of Tilda BurrowFerguson, Brelynn Wheller V, MD. Electronically Signed: Arnette NorrisMari Johnson Medical Scribe. 12/09/17. 12:36 PM.  I personally performed the services described in this documentation, which was SCRIBED in my presence. The recorded information has been reviewed and considered accurate. It has been edited as necessary during review. Tilda BurrowJohn V Desma Wilkowski, MD

## 2017-12-10 ENCOUNTER — Other Ambulatory Visit: Payer: Medicaid Other | Admitting: Obstetrics & Gynecology

## 2017-12-12 ENCOUNTER — Encounter: Payer: Self-pay | Admitting: Advanced Practice Midwife

## 2017-12-12 ENCOUNTER — Encounter: Payer: Medicaid Other | Admitting: Advanced Practice Midwife

## 2017-12-12 ENCOUNTER — Ambulatory Visit (INDEPENDENT_AMBULATORY_CARE_PROVIDER_SITE_OTHER): Payer: Medicaid Other | Admitting: Advanced Practice Midwife

## 2017-12-12 VITALS — BP 115/63 | HR 116 | Wt 195.0 lb

## 2017-12-12 DIAGNOSIS — O403XX0 Polyhydramnios, third trimester, not applicable or unspecified: Secondary | ICD-10-CM | POA: Diagnosis not present

## 2017-12-12 DIAGNOSIS — Z331 Pregnant state, incidental: Secondary | ICD-10-CM

## 2017-12-12 DIAGNOSIS — Z364 Encounter for antenatal screening for fetal growth retardation: Secondary | ICD-10-CM

## 2017-12-12 DIAGNOSIS — Z1389 Encounter for screening for other disorder: Secondary | ICD-10-CM

## 2017-12-12 DIAGNOSIS — Z843 Family history of consanguinity: Secondary | ICD-10-CM

## 2017-12-12 DIAGNOSIS — O358XX0 Maternal care for other (suspected) fetal abnormality and damage, not applicable or unspecified: Secondary | ICD-10-CM | POA: Diagnosis not present

## 2017-12-12 DIAGNOSIS — Z3A32 32 weeks gestation of pregnancy: Secondary | ICD-10-CM

## 2017-12-12 DIAGNOSIS — O0990 Supervision of high risk pregnancy, unspecified, unspecified trimester: Secondary | ICD-10-CM

## 2017-12-12 LAB — POCT URINALYSIS DIPSTICK
Blood, UA: NEGATIVE
Glucose, UA: NEGATIVE
Ketones, UA: NEGATIVE
Leukocytes, UA: NEGATIVE
Nitrite, UA: NEGATIVE
Protein, UA: POSITIVE — AB

## 2017-12-12 NOTE — Progress Notes (Signed)
HIGH-RISK PREGNANCY VISIT Patient name: Meagan [name] MRN [id number]  Date of birth: 05/25/1991 Chief Complaint:   High Risk Gestation (NST)  History of Present Illness:   Meagan [name] is a 27 y.o. [phone] female at [redacted]w[redacted]d with an Estimated Date of Delivery: 01/31/18 being seen today for ongoing management of a high-risk pregnancy complicated by polyhydramnios/short long bones.  Today she reports no complaints. Contractions: Irregular.  .  Movement: (!) Decreased. denies leaking of fluid.  Review of Systems:   Pertinent items are noted in HPI Denies abnormal vaginal discharge w/ itching/odor/irritation, headaches, visual changes, shortness of breath, chest pain, abdominal pain, severe nausea/vomiting, or problems with urination or bowel movements unless otherwise stated above.    Pertinent History Reviewed:  Medical & Surgical Hx:   Past Medical History:  Diagnosis Date  . Abdominal cramps 12/06/2015  . Anxiety   . Asthma    inhaler at bedside  . Daily headache   . Diarrhea 06/04/2013  . Hemiplegic migraine    blurred vision, nausea, dizziness, numbness  . Kidney stones   . Mental disorder    huffed inhalants "affected brain"  . Nausea 06/04/2013  . Nausea and vomiting during pregnancy 12/06/2015  . Pregnant 06/04/2013  . Pregnant 10/17/2015  . Pyelonephritis complicating pregnancy, antepartum, first trimester 11/05/2011  . Sore throat 06/04/2013  . Stomach ulcer    Past Surgical History:  Procedure Laterality Date  . ANKLE SURGERY Left 09/2016   plates & screws  . no past surgery     Family History  Problem Relation Age of Onset  . Cancer Maternal Grandmother        breast  . Cancer Paternal Grandfather        lung cancer  . Migraines Father   . Hypertension Father   . Hypercholesterolemia Father   . Other Son        ventricular defect  . Hypertension Other     Current Outpatient Medications:  .  acetaminophen (TYLENOL) 500 MG tablet, Take 1,000 mg by mouth  every 6 (six) hours as needed for moderate pain., Disp: , Rfl:  .  ALBUTEROL IN, Inhale into the lungs as needed., Disp: , Rfl:  .  ALPRAZolam (XANAX) 0.5 MG tablet, Take 1 tablet (0.5 mg total) by mouth 3 (three) times daily as needed. for anxiety, Disp: 90 tablet, Rfl: 3 .  nortriptyline (PAMELOR) 25 MG capsule, Take 1 capsule (25 mg total) by mouth at bedtime., Disp: 90 capsule, Rfl: 4 .  Prenatal Vit-Fe Fumarate-FA (PNV PRENATAL PLUS MULTIVITAMIN) 27-1 MG TABS, Take 1 tablet by mouth daily., Disp: 30 tablet, Rfl: 11  Current Facility-Administered Medications:  .  miconazole (MONISTAT 7) 2 % vaginal cream 1 Applicatorful, 1 Applicatorful, Vaginal, QHS, Tilda Burrow, MD Social History: Reviewed -  reports that she has been smoking cigarettes.  She has a 7.00 pack-year smoking history. She has never used smokeless tobacco.   Physical Assessment:   Vitals:   12/12/17 1513  BP: 115/63  Pulse: (!) 116  Weight: 195 lb (88.5 kg)  Body mass index is 35.67 kg/m.           Physical Examination:   General appearance: alert, well appearing, and in no distress  Mental status: alert, oriented to person, place, and time  Skin: warm & dry   Extremities: Edema: Trace    Cardiovascular: normal heart rate noted  Respiratory: normal respiratory effort, no distress  Abdomen: gravid, soft, non-tender  Pelvic: Cervical exam  deferred         Fetal Status: Fetal Heart Rate (bpm): 142 Fundal Height: 44 cm Movement: (!) Decreased    Fetal Surveillance Testing today: NST: FHR baseline 140 bpm, Variability: moderate, Accelerations:present, Decelerations:  Absent= Cat 1/Reactive   Results for orders placed or performed in visit on 12/12/17 (from the past 24 hour(s))  POCT urinalysis dipstick   Collection Time: 12/12/17  3:16 PM  Result Value Ref Range   Color, UA     Clarity, UA     Glucose, UA Negative Negative   Bilirubin, UA     Ketones, UA neg    Spec Grav, UA  1.010 - 1.025   Blood, UA neg     pH, UA  5.0 - 8.0   Protein, UA Positive (A) Negative   Urobilinogen, UA  0.2 or 1.0 E.U./dL   Nitrite, UA neg    Leukocytes, UA Negative Negative   Appearance     Odor      Assessment & Plan:  1) High-risk pregnancy [id number] at 3490w6d with an Estimated Date of Delivery: 01/31/18   2) Polyhydramnios,   3) short long bones, stable   Treatment Plan:  Weekly bpp, monthly growth  Follow-up: Return for weekly BPPs/HROB on Wednesdays.  Orders Placed This Encounter  Procedures  . US FETAL BPP WO NON STRESS  . POCT urinalysis dipstick   Jacklyn ShellFrances Cresenzo-Dishmon CNM 12/12/2017 3:52 PM

## 2017-12-13 ENCOUNTER — Other Ambulatory Visit: Payer: Self-pay | Admitting: *Deleted

## 2017-12-13 DIAGNOSIS — O409XX0 Polyhydramnios, unspecified trimester, not applicable or unspecified: Secondary | ICD-10-CM

## 2017-12-13 DIAGNOSIS — O0990 Supervision of high risk pregnancy, unspecified, unspecified trimester: Secondary | ICD-10-CM

## 2017-12-13 NOTE — Telephone Encounter (Signed)
Preadmission screen  

## 2017-12-13 NOTE — Progress Notes (Signed)
BPP scheduled for Friday 6/21, [facility]

## 2017-12-14 ENCOUNTER — Encounter (HOSPITAL_COMMUNITY): Payer: Self-pay | Admitting: Student

## 2017-12-14 ENCOUNTER — Inpatient Hospital Stay (HOSPITAL_COMMUNITY)
Admission: AD | Admit: 2017-12-14 | Discharge: 2017-12-14 | Disposition: A | Discharge status: Home / Self Care | Payer: Medicaid Other | Source: Ambulatory Visit | Attending: Obstetrics and Gynecology | Admitting: Obstetrics and Gynecology

## 2017-12-14 ENCOUNTER — Encounter: Payer: Self-pay | Admitting: Obstetrics and Gynecology

## 2017-12-14 ENCOUNTER — Encounter: Payer: Self-pay | Admitting: Advanced Practice Midwife

## 2017-12-14 DIAGNOSIS — O26893 Other specified pregnancy related conditions, third trimester: Secondary | ICD-10-CM | POA: Insufficient documentation

## 2017-12-14 DIAGNOSIS — Z3A33 33 weeks gestation of pregnancy: Secondary | ICD-10-CM | POA: Insufficient documentation

## 2017-12-14 DIAGNOSIS — O403XX0 Polyhydramnios, third trimester, not applicable or unspecified: Secondary | ICD-10-CM | POA: Insufficient documentation

## 2017-12-14 DIAGNOSIS — R0602 Shortness of breath: Secondary | ICD-10-CM | POA: Diagnosis not present

## 2017-12-14 DIAGNOSIS — O99333 Smoking (tobacco) complicating pregnancy, third trimester: Secondary | ICD-10-CM | POA: Diagnosis not present

## 2017-12-14 LAB — CBC
HCT: 34.2 % — ABNORMAL LOW (ref 36.0–46.0)
Hemoglobin: 11.7 g/dL — ABNORMAL LOW (ref 12.0–15.0)
MCH: 32.1 pg (ref 26.0–34.0)
MCHC: 34.2 g/dL (ref 30.0–36.0)
MCV: 94 fL (ref 78.0–100.0)
Platelets: 166 10*3/uL (ref 150–400)
RBC: 3.64 MIL/uL — ABNORMAL LOW (ref 3.87–5.11)
RDW: 14.2 % (ref 11.5–15.5)
WBC: 8.9 10*3/uL (ref 4.0–10.5)

## 2017-12-14 LAB — COMPREHENSIVE METABOLIC PANEL
ALT: 10 U/L — ABNORMAL LOW (ref 14–54)
AST: 11 U/L — ABNORMAL LOW (ref 15–41)
Albumin: 2.5 g/dL — ABNORMAL LOW (ref 3.5–5.0)
Alkaline Phosphatase: 74 U/L (ref 38–126)
Anion gap: 9 (ref 5–15)
BUN: 5 mg/dL — ABNORMAL LOW (ref 6–20)
CO2: 19 mmol/L — ABNORMAL LOW (ref 22–32)
Calcium: 8.4 mg/dL — ABNORMAL LOW (ref 8.9–10.3)
Chloride: 108 mmol/L (ref 101–111)
Creatinine, Ser: 0.48 mg/dL (ref 0.44–1.00)
GFR calc Af Amer: >60 mL/min (ref >60)
GFR calc non Af Amer: >60 mL/min (ref >60)
Glucose, Bld: 104 mg/dL — ABNORMAL HIGH (ref 65–99)
Potassium: 3.9 mmol/L (ref 3.5–5.1)
Sodium: 136 mmol/L (ref 135–145)
Total Bilirubin: <0.1 mg/dL — ABNORMAL LOW (ref 0.3–1.2)
Total Protein: 5.7 g/dL — ABNORMAL LOW (ref 6.5–8.1)

## 2017-12-14 LAB — URINALYSIS, ROUTINE W REFLEX MICROSCOPIC
Bilirubin Urine: NEGATIVE
Glucose, UA: NEGATIVE mg/dL
Hgb urine dipstick: NEGATIVE
Ketones, ur: NEGATIVE mg/dL
Leukocytes, UA: NEGATIVE
Nitrite: NEGATIVE
Protein, ur: NEGATIVE mg/dL
Specific Gravity, Urine: 1.013 (ref 1.005–1.030)
pH: 7 (ref 5.0–8.0)

## 2017-12-14 LAB — PROTEIN / CREATININE RATIO, URINE
Creatinine, Urine: 114 mg/dL
Protein Creatinine Ratio: 0.08 mg/mg{Cre} (ref 0.00–0.15)
Total Protein, Urine: 9 mg/dL

## 2017-12-14 NOTE — Discharge Instructions (Signed)
Fetal Movement Counts Patient Name: ________________________________________________ Patient Due Date: ____________________ What is a fetal movement count? A fetal movement count is the number of times that you feel your baby move during a certain amount of time. This may also be called a fetal kick count. A fetal movement count is recommended for every pregnant woman. You may be asked to start counting fetal movements as early as week 28 of your pregnancy. Pay attention to when your baby is most active. You may notice your baby's sleep and wake cycles. You may also notice things that make your baby move more. You should do a fetal movement count:  When your baby is normally most active.  At the same time each day.  A good time to count movements is while you are resting, after having something to eat and drink. How do I count fetal movements? 1. Find a quiet, comfortable area. Sit, or lie down on your side. 2. Write down the date, the start time and stop time, and the number of movements that you felt between those two times. Take this information with you to your health care visits. 3. For 2 hours, count kicks, flutters, swishes, rolls, and jabs. You should feel at least 10 movements during 2 hours. 4. You may stop counting after you have felt 10 movements. 5. If you do not feel 10 movements in 2 hours, have something to eat and drink. Then, keep resting and counting for 1 hour. If you feel at least 4 movements during that hour, you may stop counting. Contact a health care provider if:  You feel fewer than 4 movements in 2 hours.  Your baby is not moving like he or she usually does. Date: ____________ Start time: ____________ Stop time: ____________ Movements: ____________ Date: ____________ Start time: ____________ Stop time: ____________ Movements: ____________ Date: ____________ Start time: ____________ Stop time: ____________ Movements: ____________ Date: ____________ Start time:  ____________ Stop time: ____________ Movements: ____________ Date: ____________ Start time: ____________ Stop time: ____________ Movements: ____________ Date: ____________ Start time: ____________ Stop time: ____________ Movements: ____________ Date: ____________ Start time: ____________ Stop time: ____________ Movements: ____________ Date: ____________ Start time: ____________ Stop time: ____________ Movements: ____________ Date: ____________ Start time: ____________ Stop time: ____________ Movements: ____________ This information is not intended to replace advice given to you by your health care provider. Make sure you discuss any questions you have with your health care provider. Document Released: 07/18/2006 Document Revised: 02/15/2016 Document Reviewed: 07/28/2015 Elsevier Interactive Patient Education  2018 [employer]. Shortness of Breath, Adult Shortness of breath means you have trouble breathing. Your lungs are organs for breathing. Follow these instructions at home: Pay attention to any changes in your symptoms. Take these actions to help with your condition:  Do not smoke. Smoking can cause shortness of breath. If you need help to quit smoking, ask your doctor.  Avoid things that can make it harder to breathe, such as: ? Mold. ? Dust. ? Air pollution. ? Chemical smells. ? Things that can cause allergy symptoms (allergens), if you have allergies.  Keep your living space clean and free of mold and dust.  Rest as needed. Slowly return to your usual activities.  Take over-the-counter and prescription medicines, including oxygen and inhaled medicines, only as told by your doctor.  Keep all follow-up visits as told by your doctor. This is important.  Contact a doctor if:  Your condition does not get better as soon as expected.  You have a hard time doing your  normal activities, even after you rest.  You have new symptoms. Get help right away if:  You have trouble  breathing when you are resting.  You feel light-headed or you faint.  You have a cough that is not helped by medicines.  You cough up blood.  You have pain with breathing.  You have pain in your chest, arms, shoulders, or belly (abdomen).  You have a fever.  You cannot walk up stairs.  You cannot exercise the way you normally do. This information is not intended to replace advice given to you by your health care provider. Make sure you discuss any questions you have with your health care provider. Document Released: 12/05/2007 Document Revised: 07/05/2016 Document Reviewed: 07/05/2016 Elsevier Interactive Patient Education  2017 [employer]

## 2017-12-14 NOTE — MAU Provider Note (Signed)
History     CSN: [id number]  Arrival date and time: 12/14/17 1416   First Provider Initiated Contact with Patient 12/14/17 1512      Chief Complaint  Patient presents with  . Hypertension  . Shortness of Breath   HPI Meagan [name] is a 27 y.o. [phone] at [redacted]w[redacted]d who presents with shortness of breath. Current pregnancy complicated by polyhydramnios, last AFI of 31 cm 6/7.  Reports having trouble catching her breath when walking around her house. Denies cough, fever/chills, chest pain, or wheezing. Report hx of childhood asthma and states she currently uses an albuterol inhaler for "anxiety". Is a cigarette smoker. States she took her BP at home and it was up to 160s/100s. Hx of PEC with previous pregnancy. States she does not have hypertension outside of pregnancy and has not had hypertension in the office during this pregnancy. Endorses right sided headache with hx of migraines. Has not treated current headache. Denies visual disturbance or epigastric pain. Denies abdominal pain. Positive fetal movement.   OB History    Gravida  4   Para  3   Term  3   Preterm  0   AB  0   Living  3     SAB  0   TAB  0   Ectopic  0   Multiple  0   Live Births  3           Past Medical History:  Diagnosis Date  . Abdominal cramps 12/06/2015  . Anxiety   . Asthma    inhaler at bedside  . Daily headache   . Diarrhea 06/04/2013  . Hemiplegic migraine    blurred vision, nausea, dizziness, numbness  . Kidney stones   . Mental disorder    huffed inhalants "affected brain"  . Pyelonephritis complicating pregnancy, antepartum, first trimester 11/05/2011  . Stomach ulcer     Past Surgical History:  Procedure Laterality Date  . ANKLE SURGERY Left 09/2016   plates & screws    Family History  Problem Relation Age of Onset  . Cancer Maternal Grandmother        breast  . Cancer Paternal Grandfather        lung cancer  . Migraines Father   . Hypertension Father   .  Hypercholesterolemia Father   . Other Son        ventricular defect  . Hypertension Other     Social History   Tobacco Use  . Smoking status: Current Every Day Smoker    Packs/day: 1.00    Years: 7.00    Pack years: 7.00    Types: Cigarettes  . Smokeless tobacco: Never Used  . Tobacco comment: current smoker 06/17/2016 2300  Substance Use Topics  . Alcohol use: No    Alcohol/week: 0.0 oz    Frequency: Never    Comment: occ; not now  . Drug use: No    Types: Other-see comments    Comment: huffed inhalants in past, "affected Brain"    Allergies:  Allergies  Allergen Reactions  . Codeine Nausea And Vomiting and Other (See Comments)    Vertigo  . Penicillins Rash and Other (See Comments)    Unknown:  Childhood reaction.  Has patient had a PCN reaction causing immediate rash, facial/tongue/throat swelling, SOB or lightheadedness with hypotension: Unknown Has patient had a PCN reaction causing severe rash involving mucus membranes or skin necrosis: Unknown Has patient had a PCN reaction that required hospitalization No Has patient  had a PCN reaction occurring within the last 10 years: No If all of the above answers are "NO", then may pro  . Sulfamethoxazole-Trimethoprim Hives    Facility-Administered Medications Prior to Admission  Medication Dose Route Frequency Provider Last Rate Last Dose  . miconazole (MONISTAT 7) 2 % vaginal cream 1 Applicatorful  1 Applicatorful Vaginal QHS Tilda BurrowFerguson, John V, MD       Medications Prior to Admission  Medication Sig Dispense Refill Last Dose  . acetaminophen (TYLENOL) 500 MG tablet Take 1,000 mg by mouth every 6 (six) hours as needed for moderate pain.   Taking  . ALBUTEROL IN Inhale into the lungs as needed.   Taking  . ALPRAZolam (XANAX) 0.5 MG tablet Take 1 tablet (0.5 mg total) by mouth 3 (three) times daily as needed. for anxiety 90 tablet 3 Taking  . nortriptyline (PAMELOR) 25 MG capsule Take 1 capsule (25 mg total) by mouth at  bedtime. 90 capsule 4 Taking  . Prenatal Vit-Fe Fumarate-FA (PNV PRENATAL PLUS MULTIVITAMIN) 27-1 MG TABS Take 1 tablet by mouth daily. 30 tablet 11 Taking    Review of Systems Physical Exam   Blood pressure 122/69, pulse (!) 105, temperature 98.2 F (36.8 C), temperature source Oral, resp. rate 18, weight 196 lb 1.3 oz (88.9 kg), last menstrual period 04/26/2017, SpO2 98 %, not currently breastfeeding. Patient Vitals for the past 24 hrs:  BP Temp Temp src Pulse Resp SpO2 Weight  12/14/17 1727 - - Oral - 18 98 % -  12/14/17 1723 122/69 - - (!) 105 - - -  12/14/17 1530 115/62 - - (!) 106 - - -  12/14/17 1515 (!) 119/59 - - (!) 101 - - -  12/14/17 1500 120/65 - - (!) 107 - - -  12/14/17 1450 117/65 - - (!) 120 - - -  12/14/17 1431 136/82 98.2 F (36.8 C) Oral (!) 115 19 100 % 196 lb 1.3 oz (88.9 kg)    Physical Exam  Nursing note and vitals reviewed. Constitutional: She is oriented to person, place, and time. She appears well-developed and well-nourished. No distress.  HENT:  Head: Normocephalic and atraumatic.  Eyes: Conjunctivae are normal. Right eye exhibits no discharge. Left eye exhibits no discharge. No scleral icterus.  Neck: Normal range of motion.  Cardiovascular: Normal rate, regular rhythm and normal heart sounds.  No murmur heard. Respiratory: Effort normal and breath sounds normal. No respiratory distress. She has no wheezes.  GI: Soft. There is no tenderness.  Neurological: She is alert and oriented to person, place, and time.  Skin: Skin is warm and dry. She is not diaphoretic.  Psychiatric: She has a normal mood and affect. Her behavior is normal. Judgment and thought content normal.    MAU Course  Procedures Results for orders placed or performed during the hospital encounter of 12/14/17 (from the past 24 hour(s))  Urinalysis, Routine w reflex microscopic     Status: Abnormal   Collection Time: 12/14/17  2:30 PM  Result Value Ref Range   Color, Urine YELLOW  YELLOW   APPearance HAZY (A) CLEAR   Specific Gravity, Urine 1.013 1.005 - 1.030   pH 7.0 5.0 - 8.0   Glucose, UA NEGATIVE NEGATIVE mg/dL   Hgb urine dipstick NEGATIVE NEGATIVE   Bilirubin Urine NEGATIVE NEGATIVE   Ketones, ur NEGATIVE NEGATIVE mg/dL   Protein, ur NEGATIVE NEGATIVE mg/dL   Nitrite NEGATIVE NEGATIVE   Leukocytes, UA NEGATIVE NEGATIVE  Protein / creatinine ratio, urine  Status: None   Collection Time: 12/14/17  2:30 PM  Result Value Ref Range   Creatinine, Urine 114.00 mg/dL   Total Protein, Urine 9 mg/dL   Protein Creatinine Ratio 0.08 0.00 - 0.15 mg/mg[Cre]  CBC     Status: Abnormal   Collection Time: 12/14/17  3:22 PM  Result Value Ref Range   WBC 8.9 4.0 - 10.5 K/uL   RBC 3.64 (L) 3.87 - 5.11 MIL/uL   Hemoglobin 11.7 (L) 12.0 - 15.0 g/dL   HCT 16.1 (L) 09.6 - 04.5 %   MCV 94.0 78.0 - 100.0 fL   MCH 32.1 26.0 - 34.0 pg   MCHC 34.2 30.0 - 36.0 g/dL   RDW 40.9 81.1 - 91.4 %   Platelets 166 150 - 400 K/uL  Comprehensive metabolic panel     Status: Abnormal   Collection Time: 12/14/17  3:22 PM  Result Value Ref Range   Sodium 136 135 - 145 mmol/L   Potassium 3.9 3.5 - 5.1 mmol/L   Chloride 108 101 - 111 mmol/L   CO2 19 (L) 22 - 32 mmol/L   Glucose, Bld 104 (H) 65 - 99 mg/dL   BUN 5 (L) 6 - 20 mg/dL   Creatinine, Ser 7.82 0.44 - 1.00 mg/dL   Calcium 8.4 (L) 8.9 - 10.3 mg/dL   Total Protein 5.7 (L) 6.5 - 8.1 g/dL   Albumin 2.5 (L) 3.5 - 5.0 g/dL   AST 11 (L) 15 - 41 U/L   ALT 10 (L) 14 - 54 U/L   Alkaline Phosphatase 74 38 - 126 U/L   Total Bilirubin <0.1 (L) 0.3 - 1.2 mg/dL   GFR calc non Af Amer >60 >60 mL/min   GFR calc Af Amer >60 >60 mL/min   Anion gap 9 5 - 15    MDM NST:  Baseline: 135 bpm, Variability: Good {> 6 bpm), Accelerations: Reactive and Decelerations: Absent  Pt normotensive in MAU. PEC labs ordered d/t reports of hypertension at home.  EKG sinus tachy.  SpO2 100%. Lung sounds clear throughout & pt in no apparent distress.  Pt  reassured by VS and labs.   Assessment and Plan  A: 1. Shortness of breath due to pregnancy in third trimester   2. Polyhydramnios in third trimester complication, single or unspecified fetus   3. [redacted] weeks gestation of pregnancy    P; Discharge home Quit smoking Discussed reasons to return to MAU Keep f/u with OB  Judeth Horn 12/14/2017, 3:12 PM

## 2017-12-15 ENCOUNTER — Encounter: Payer: Self-pay | Admitting: Obstetrics and Gynecology

## 2017-12-15 ENCOUNTER — Encounter: Payer: Self-pay | Admitting: Advanced Practice Midwife

## 2017-12-17 ENCOUNTER — Ambulatory Visit (INDEPENDENT_AMBULATORY_CARE_PROVIDER_SITE_OTHER): Payer: Medicaid Other | Admitting: Advanced Practice Midwife

## 2017-12-17 ENCOUNTER — Encounter (HOSPITAL_COMMUNITY): Payer: Self-pay | Admitting: *Deleted

## 2017-12-17 ENCOUNTER — Inpatient Hospital Stay (HOSPITAL_COMMUNITY)
Admission: AD | Admit: 2017-12-17 | Discharge: 2017-12-21 | DRG: 786 | Disposition: A | Discharge status: Home / Self Care | Payer: Medicaid Other | Attending: Family Medicine | Admitting: Family Medicine

## 2017-12-17 ENCOUNTER — Inpatient Hospital Stay (HOSPITAL_COMMUNITY): Payer: Medicaid Other

## 2017-12-17 VITALS — BP 124/79 | HR 120 | Wt 194.4 lb

## 2017-12-17 DIAGNOSIS — O99214 Obesity complicating childbirth: Secondary | ICD-10-CM | POA: Diagnosis present

## 2017-12-17 DIAGNOSIS — F17210 Nicotine dependence, cigarettes, uncomplicated: Secondary | ICD-10-CM | POA: Diagnosis present

## 2017-12-17 DIAGNOSIS — O09299 Supervision of pregnancy with other poor reproductive or obstetric history, unspecified trimester: Secondary | ICD-10-CM

## 2017-12-17 DIAGNOSIS — O42913 Preterm premature rupture of membranes, unspecified as to length of time between rupture and onset of labor, third trimester: Secondary | ICD-10-CM | POA: Diagnosis present

## 2017-12-17 DIAGNOSIS — Z331 Pregnant state, incidental: Secondary | ICD-10-CM

## 2017-12-17 DIAGNOSIS — R58 Hemorrhage, not elsewhere classified: Secondary | ICD-10-CM

## 2017-12-17 DIAGNOSIS — Z1389 Encounter for screening for other disorder: Secondary | ICD-10-CM

## 2017-12-17 DIAGNOSIS — O328XX0 Maternal care for other malpresentation of fetus, not applicable or unspecified: Secondary | ICD-10-CM | POA: Diagnosis present

## 2017-12-17 DIAGNOSIS — Z79899 Other long term (current) drug therapy: Secondary | ICD-10-CM

## 2017-12-17 DIAGNOSIS — O99344 Other mental disorders complicating childbirth: Secondary | ICD-10-CM | POA: Diagnosis present

## 2017-12-17 DIAGNOSIS — O0993 Supervision of high risk pregnancy, unspecified, third trimester: Secondary | ICD-10-CM

## 2017-12-17 DIAGNOSIS — O4593 Premature separation of placenta, unspecified, third trimester: Principal | ICD-10-CM | POA: Diagnosis present

## 2017-12-17 DIAGNOSIS — O4700 False labor before 37 completed weeks of gestation, unspecified trimester: Secondary | ICD-10-CM | POA: Diagnosis present

## 2017-12-17 DIAGNOSIS — Z880 Allergy status to penicillin: Secondary | ICD-10-CM | POA: Diagnosis not present

## 2017-12-17 DIAGNOSIS — J45909 Unspecified asthma, uncomplicated: Secondary | ICD-10-CM | POA: Diagnosis present

## 2017-12-17 DIAGNOSIS — O409XX0 Polyhydramnios, unspecified trimester, not applicable or unspecified: Secondary | ICD-10-CM | POA: Diagnosis present

## 2017-12-17 DIAGNOSIS — Z843 Family history of consanguinity: Secondary | ICD-10-CM

## 2017-12-17 DIAGNOSIS — O403XX0 Polyhydramnios, third trimester, not applicable or unspecified: Secondary | ICD-10-CM | POA: Diagnosis present

## 2017-12-17 DIAGNOSIS — E669 Obesity, unspecified: Secondary | ICD-10-CM | POA: Diagnosis present

## 2017-12-17 DIAGNOSIS — Z6791 Unspecified blood type, Rh negative: Secondary | ICD-10-CM | POA: Diagnosis not present

## 2017-12-17 DIAGNOSIS — Z3A33 33 weeks gestation of pregnancy: Secondary | ICD-10-CM

## 2017-12-17 DIAGNOSIS — F1390 Sedative, hypnotic, or anxiolytic use, unspecified, uncomplicated: Secondary | ICD-10-CM | POA: Diagnosis present

## 2017-12-17 DIAGNOSIS — D649 Anemia, unspecified: Secondary | ICD-10-CM | POA: Diagnosis present

## 2017-12-17 DIAGNOSIS — F419 Anxiety disorder, unspecified: Secondary | ICD-10-CM | POA: Diagnosis present

## 2017-12-17 DIAGNOSIS — Z364 Encounter for antenatal screening for fetal growth retardation: Secondary | ICD-10-CM

## 2017-12-17 DIAGNOSIS — O9902 Anemia complicating childbirth: Secondary | ICD-10-CM | POA: Diagnosis present

## 2017-12-17 DIAGNOSIS — O99334 Smoking (tobacco) complicating childbirth: Secondary | ICD-10-CM | POA: Diagnosis present

## 2017-12-17 DIAGNOSIS — O99324 Drug use complicating childbirth: Secondary | ICD-10-CM | POA: Diagnosis present

## 2017-12-17 DIAGNOSIS — O4703 False labor before 37 completed weeks of gestation, third trimester: Secondary | ICD-10-CM | POA: Diagnosis present

## 2017-12-17 DIAGNOSIS — O9952 Diseases of the respiratory system complicating childbirth: Secondary | ICD-10-CM | POA: Diagnosis present

## 2017-12-17 DIAGNOSIS — O26893 Other specified pregnancy related conditions, third trimester: Secondary | ICD-10-CM | POA: Diagnosis present

## 2017-12-17 DIAGNOSIS — O0990 Supervision of high risk pregnancy, unspecified, unspecified trimester: Secondary | ICD-10-CM

## 2017-12-17 DIAGNOSIS — F17200 Nicotine dependence, unspecified, uncomplicated: Secondary | ICD-10-CM | POA: Diagnosis present

## 2017-12-17 DIAGNOSIS — O321XX0 Maternal care for breech presentation, not applicable or unspecified: Secondary | ICD-10-CM | POA: Diagnosis not present

## 2017-12-17 LAB — URINALYSIS, ROUTINE W REFLEX MICROSCOPIC
Bilirubin Urine: NEGATIVE
Glucose, UA: NEGATIVE mg/dL
Hgb urine dipstick: NEGATIVE
Ketones, ur: NEGATIVE mg/dL
Leukocytes, UA: NEGATIVE
Nitrite: NEGATIVE
Protein, ur: 30 mg/dL — AB
Specific Gravity, Urine: 1.024 (ref 1.005–1.030)
pH: 6 (ref 5.0–8.0)

## 2017-12-17 LAB — CBC
HCT: 35.2 % — ABNORMAL LOW (ref 36.0–46.0)
Hemoglobin: 11.9 g/dL — ABNORMAL LOW (ref 12.0–15.0)
MCH: 31.6 pg (ref 26.0–34.0)
MCHC: 33.8 g/dL (ref 30.0–36.0)
MCV: 93.4 fL (ref 78.0–100.0)
Platelets: 193 10*3/uL (ref 150–400)
RBC: 3.77 MIL/uL — ABNORMAL LOW (ref 3.87–5.11)
RDW: 14.3 % (ref 11.5–15.5)
WBC: 12.4 10*3/uL — ABNORMAL HIGH (ref 4.0–10.5)

## 2017-12-17 LAB — WET PREP, GENITAL
Clue Cells Wet Prep HPF POC: NONE SEEN
Sperm: NONE SEEN
Trich, Wet Prep: NONE SEEN
Yeast Wet Prep HPF POC: NONE SEEN

## 2017-12-17 LAB — POCT URINALYSIS DIPSTICK
Blood, UA: NEGATIVE
Glucose, UA: NEGATIVE
Ketones, UA: NEGATIVE
Leukocytes, UA: NEGATIVE
Nitrite, UA: NEGATIVE
Protein, UA: POSITIVE — AB

## 2017-12-17 MED ORDER — ACETAMINOPHEN 325 MG PO TABS: 650.0000 mg | ORAL_TABLET | ORAL | Status: DC | PRN

## 2017-12-17 MED ORDER — OXYTOCIN 40 UNITS IN LACTATED RINGERS INFUSION - SIMPLE MED
2.5000 [IU]/h | INTRAVENOUS | Status: DC
  Filled 2017-12-17: qty 1000

## 2017-12-17 MED ORDER — MAGNESIUM SULFATE BOLUS VIA INFUSION
4.0000 g | Freq: Once | INTRAVENOUS | Status: AC
  Administered 2017-12-17: 4 g via INTRAVENOUS
  Filled 2017-12-17: qty 500

## 2017-12-17 MED ORDER — LACTATED RINGERS IV BOLUS
1000.0000 mL | Freq: Once | INTRAVENOUS | Status: AC
  Administered 2017-12-17: 1000 mL via INTRAVENOUS

## 2017-12-17 MED ORDER — OXYTOCIN BOLUS FROM INFUSION: 500.0000 mL | Freq: Once | INTRAVENOUS | Status: DC

## 2017-12-17 MED ORDER — VANCOMYCIN HCL IN DEXTROSE 1-5 GM/200ML-% IV SOLN
1000.0000 mg | Freq: Two times a day (BID) | INTRAVENOUS | Status: DC
  Administered 2017-12-17 – 2017-12-18 (×3): 1000 mg via INTRAVENOUS
  Filled 2017-12-17 (×4): qty 200

## 2017-12-17 MED ORDER — LACTATED RINGERS IV SOLN: INTRAVENOUS | Status: DC

## 2017-12-17 MED ORDER — BETAMETHASONE SOD PHOS & ACET 6 (3-3) MG/ML IJ SUSP
12.0000 mg | Freq: Once | INTRAMUSCULAR | Status: AC
  Administered 2017-12-17: 12 mg via INTRAMUSCULAR
  Filled 2017-12-17: qty 2

## 2017-12-17 MED ORDER — ZOLPIDEM TARTRATE 5 MG PO TABS
5.0000 mg | ORAL_TABLET | Freq: Every evening | ORAL | Status: DC | PRN
Start: 2017-12-17 — End: 2017-12-19

## 2017-12-17 MED ORDER — ONDANSETRON HCL 4 MG/2ML IJ SOLN
4.0000 mg | Freq: Four times a day (QID) | INTRAMUSCULAR | Status: DC | PRN
  Administered 2017-12-18: 4 mg via INTRAVENOUS
  Filled 2017-12-17: qty 2

## 2017-12-17 MED ORDER — ALPRAZOLAM 0.5 MG PO TABS
0.5000 mg | ORAL_TABLET | Freq: Three times a day (TID) | ORAL | Status: DC | PRN
  Administered 2017-12-17 – 2017-12-18 (×3): 0.5 mg via ORAL
  Filled 2017-12-17 (×3): qty 1

## 2017-12-17 MED ORDER — DOCUSATE SODIUM 100 MG PO CAPS
100.0000 mg | ORAL_CAPSULE | Freq: Every day | ORAL | Status: DC
  Administered 2017-12-18: 100 mg via ORAL
  Filled 2017-12-17: qty 1

## 2017-12-17 MED ORDER — SOD CITRATE-CITRIC ACID 500-334 MG/5ML PO SOLN
30.0000 mL | ORAL | Status: DC | PRN
  Filled 2017-12-17 (×3): qty 15

## 2017-12-17 MED ORDER — ACETAMINOPHEN 325 MG PO TABS
650.0000 mg | ORAL_TABLET | ORAL | Status: DC | PRN
  Administered 2017-12-17 – 2017-12-18 (×3): 650 mg via ORAL
  Filled 2017-12-17 (×3): qty 2

## 2017-12-17 MED ORDER — CALCIUM CARBONATE ANTACID 500 MG PO CHEW: 2.0000 | CHEWABLE_TABLET | ORAL | Status: DC | PRN

## 2017-12-17 MED ORDER — LACTATED RINGERS IV SOLN
INTRAVENOUS | Status: DC
  Administered 2017-12-17: 100 mL via INTRAVENOUS
  Administered 2017-12-18 (×2): via INTRAVENOUS

## 2017-12-17 MED ORDER — MAGNESIUM SULFATE 40 G IN LACTATED RINGERS - SIMPLE
2.0000 g/h | INTRAVENOUS | Status: DC
  Administered 2017-12-17 – 2017-12-18 (×2): 2 g/h via INTRAVENOUS
  Filled 2017-12-17 (×2): qty 40

## 2017-12-17 MED ORDER — LACTATED RINGERS IV SOLN: 500.0000 mL | INTRAVENOUS | Status: DC | PRN

## 2017-12-17 MED ORDER — LIDOCAINE HCL (PF) 1 % IJ SOLN
30.0000 mL | INTRAMUSCULAR | Status: DC | PRN
  Filled 2017-12-17: qty 30

## 2017-12-17 MED ORDER — PRENATAL MULTIVITAMIN CH: 1.0000 | ORAL_TABLET | Freq: Every day | ORAL | Status: DC

## 2017-12-17 MED ORDER — NIFEDIPINE 10 MG PO CAPS
10.0000 mg | ORAL_CAPSULE | ORAL | Status: AC | PRN
  Administered 2017-12-17 (×4): 10 mg via ORAL
  Filled 2017-12-17 (×4): qty 1

## 2017-12-17 NOTE — Progress Notes (Signed)
Bedside U/S for fetal presentation done by V. Aundria Rudogers, CNM.  Fetus breech

## 2017-12-17 NOTE — Anesthesia Pain Management Evaluation Note (Signed)
  CRNA Pain Management Visit Note  Patient: Meagan [name], 27 y.o., female  "Hello I am a member of the anesthesia team at [facility]. We have an anesthesia team available at all times to provide care throughout the hospital, including epidural management and anesthesia for C-section. I don't know your plan for the delivery whether it a natural birth, water birth, IV sedation, nitrous supplementation, doula or epidural, but we want to meet your pain goals."   1.Was your pain managed to your expectations on prior hospitalizations?   Yes   2.What is your expectation for pain management during this hospitalization?     Epidural  3.How can we help you reach that goal? Epidural when desired  Record the patient's initial score and the patient's pain goal.   Pain: 5  Pain Goal: 6 [facility] wants you to be able to say your pain was always managed very well.  Meagan [name] 12/17/2017

## 2017-12-17 NOTE — MAU Note (Signed)
Seen in md office today and she was having contractions and SVE 2.

## 2017-12-17 NOTE — H&P (Addendum)
LABOR AND DELIVERY ADMISSION HISTORY AND PHYSICAL NOTE  Meagan [name] is a 27 y.o. female [phone] with IUP at 6722w4d by LMP c/w 6 wk US presenting for pre-term labor from office. She woke up with regular contractions today. [facility] 1.5 cm dilated, changed from 2 cm to 3 cm in MAU and thus is admitted for pre-term labor.  She reports positive fetal movement. She denies leakage of fluid or vaginal bleeding.  Prenatal History/Complications: PNC at [facility] Pregnancy complications:  - Rh neg status - polyhydramnios - AFI 31 at 32 weeks - anxiety on Xanax - tobacco use - history of preeclampsia in 2 prior pregnancies, on ASA this pregnancy - consanguinity - pt and FOB are first cousins - shortened long bones of fetus on anatomy US  Past Medical History: Past Medical History:  Diagnosis Date  . Abdominal cramps 12/06/2015  . Anxiety   . Asthma    inhaler at bedside  . Daily headache   . Diarrhea 06/04/2013  . Hemiplegic migraine    blurred vision, nausea, dizziness, numbness  . Kidney stones   . Mental disorder    huffed inhalants "affected brain"  . Pyelonephritis complicating pregnancy, antepartum, first trimester 11/05/2011  . Stomach ulcer     Past Surgical History: Past Surgical History:  Procedure Laterality Date  . ANKLE SURGERY Left 09/2016   plates & screws    Obstetrical History: OB History    Gravida  4   Para  3   Term  3   Preterm  0   AB  0   Living  3     SAB  0   TAB  0   Ectopic  0   Multiple  0   Live Births  3           Social History: Social History   Socioeconomic History  . Marital status: Married    Spouse name: Not on file  . Number of children: 3  . Years of education: Not on file  . Highest education level: GED or equivalent  Occupational History  . Not on file  Social Needs  . Financial resource strain: Not on file  . Food insecurity:    Worry: Not on file     Inability: Not on file  . Transportation needs:    Medical: Not on file    Non-medical: Not on file  Tobacco Use  . Smoking status: Current Every Day Smoker    Packs/day: 1.00    Years: 7.00    Pack years: 7.00    Types: Cigarettes  . Smokeless tobacco: Never Used  . Tobacco comment: current smoker 06/17/2016 2300  Substance and Sexual Activity  . Alcohol use: No    Alcohol/week: 0.0 oz    Frequency: Never    Comment: occ; not now  . Drug use: No    Types: Other-see comments    Comment: huffed inhalants in past, "affected Brain"  . Sexual activity: Yes    Birth control/protection: None  Lifestyle  . Physical activity:    Days per week: Not on file    Minutes per session: Not on file  . Stress: Not on file  Relationships  . Social connections:    Talks on phone: Not on file    Gets together: Not on file    Attends religious service: Not on file    Active member of club or organization:  Not on file    Attends meetings of clubs or organizations: Not on file    Relationship status: Not on file  Other Topics Concern  . Not on file  Social History Narrative   Lives at home with her children   Right handed.   Six sodas per day.    Family History: Family History  Problem Relation Age of Onset  . Cancer Maternal Grandmother        breast  . Cancer Paternal Grandfather        lung cancer  . Migraines Father   . Hypertension Father   . Hypercholesterolemia Father   . Other Son        ventricular defect  . Hypertension Other     Allergies: Allergies  Allergen Reactions  . Codeine Nausea And Vomiting and Other (See Comments)    Vertigo  . Penicillins Rash and Other (See Comments)    Unknown:  Childhood reaction.  Has patient had a PCN reaction causing immediate rash, facial/tongue/throat swelling, SOB or lightheadedness with hypotension: Unknown Has patient had a PCN reaction causing severe rash involving mucus membranes or skin necrosis: Unknown Has patient had  a PCN reaction that required hospitalization No Has patient had a PCN reaction occurring within the last 10 years: No If all of the above answers are "NO", then may pro  . Sulfamethoxazole-Trimethoprim Hives    Facility-Administered Medications Prior to Admission  Medication Dose Route Frequency Provider Last Rate Last Dose  . miconazole (MONISTAT 7) 2 % vaginal cream 1 Applicatorful  1 Applicatorful Vaginal QHS Tilda Burrow, MD       Medications Prior to Admission  Medication Sig Dispense Refill Last Dose  . acetaminophen (TYLENOL) 500 MG tablet Take 1,000 mg by mouth every 6 (six) hours as needed for moderate pain.   Past Week at Unknown time  . albuterol (PROVENTIL HFA;VENTOLIN HFA) 108 (90 Base) MCG/ACT inhaler Inhale 1-2 puffs into the lungs every 6 (six) hours as needed for wheezing or shortness of breath.   rescue  . ALPRAZolam (XANAX) 0.5 MG tablet Take 1 tablet (0.5 mg total) by mouth 3 (three) times daily as needed. for anxiety 90 tablet 3 12/17/2017 at Unknown time  . nortriptyline (PAMELOR) 25 MG capsule Take 1 capsule (25 mg total) by mouth at bedtime. 90 capsule 4 12/16/2017 at Unknown time  . Prenatal Vit-Fe Fumarate-FA (PNV PRENATAL PLUS MULTIVITAMIN) 27-1 MG TABS Take 1 tablet by mouth daily. 30 tablet 11 12/16/2017 at Unknown time     Review of Systems  All systems reviewed and negative except as stated in HPI  Physical Exam Blood pressure 124/74, pulse (!) 109, temperature 98.5 F (36.9 C), temperature source Oral, resp. rate 16, height 5\' 2"  (1.575 m), weight 88.9 kg (196 lb), last menstrual period 04/26/2017, SpO2 98 %, not currently breastfeeding. General appearance: alert, oriented, NAD Lungs: normal respiratory effort Heart: regular rate Abdomen: soft, non-tender; gravid, FH appropriate for GA Extremities: No calf swelling or tenderness Presentation: breech by BSUS in MAU by Steward Drone, CNM Fetal monitoring: 130 bpm/moderate variability/no accels, no  decels Uterine activity: q5-7 mins Dilation: 3 Effacement (%): 70, 80 Station: -3 Exam by:: Lanice Shirts CNM  Prenatal labs: ABO, Rh:   Antibody: Negative (05/16 0925) Rubella: 1.44 (12/26 1520) RPR: Non Reactive (05/16 0925)  HBsAg: Negative (12/26 1520)  HIV: Non Reactive (05/16 0925)  GC/Chlamydia: negative in 1st trimester GBS:   unknown, collected on admission 2-hr GTT: negative (66/132/102) Genetic  screening:  negative Anatomy US: shortened long bones  Prenatal Transfer Tool  Maternal Diabetes: No Genetic Screening: Normal Maternal Ultrasounds/Referrals: Abnormal:  Findings:   Other: polyhydramnios, shortened long bones Fetal Ultrasounds or other Referrals:  None Maternal Substance Abuse:  Yes:  Type: Smoker Significant Maternal Medications:  Meds include: Other:  xanax Significant Maternal Lab Results: None  Results for orders placed or performed during the hospital encounter of 12/17/17 (from the past 24 hour(s))  Wet prep, genital   Collection Time: 12/17/17  2:43 PM  Result Value Ref Range   Yeast Wet Prep HPF POC NONE SEEN NONE SEEN   Trich, Wet Prep NONE SEEN NONE SEEN   Clue Cells Wet Prep HPF POC NONE SEEN NONE SEEN   WBC, Wet Prep HPF POC FEW (A) NONE SEEN   Sperm NONE SEEN   Urinalysis, Routine w reflex microscopic   Collection Time: 12/17/17  2:50 PM  Result Value Ref Range   Color, Urine AMBER (A) YELLOW   APPearance HAZY (A) CLEAR   Specific Gravity, Urine 1.024 1.005 - 1.030   pH 6.0 5.0 - 8.0   Glucose, UA NEGATIVE NEGATIVE mg/dL   Hgb urine dipstick NEGATIVE NEGATIVE   Bilirubin Urine NEGATIVE NEGATIVE   Ketones, ur NEGATIVE NEGATIVE mg/dL   Protein, ur 30 (A) NEGATIVE mg/dL   Nitrite NEGATIVE NEGATIVE   Leukocytes, UA NEGATIVE NEGATIVE   RBC / HPF 0-5 0 - 5 RBC/hpf   WBC, UA 0-5 0 - 5 WBC/hpf   Bacteria, UA RARE (A) NONE SEEN   Squamous Epithelial / LPF 11-20 0 - 5   Mucus PRESENT    Ca Oxalate Crys, UA PRESENT   Results for orders placed  or performed in visit on 12/17/17 (from the past 24 hour(s))  POCT urinalysis dipstick   Collection Time: 12/17/17 11:54 AM  Result Value Ref Range   Color, UA     Clarity, UA     Glucose, UA Negative Negative   Bilirubin, UA     Ketones, UA neg    Spec Grav, UA  1.010 - 1.025   Blood, UA neg    pH, UA  5.0 - 8.0   Protein, UA Positive (A) Negative   Urobilinogen, UA  0.2 or 1.0 E.U./dL   Nitrite, UA neg    Leukocytes, UA Negative Negative   Appearance     Odor      Patient Active Problem List   Diagnosis Date Noted  . Preterm uterine contractions in third trimester, antepartum 12/17/2017  . 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 Supervision of high risk pregnancy, antepartum 06/26/2017  . Polyhydramnios affecting pregnancy 05/02/2016  . Diarrhea 12/06/2015  . Hx of preeclampsia 11/16/2015  . 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 Smoker 12/16/2013  . Anxiety 06/17/2013    Assessment: Meagan [name] is a 27 y.o. [phone] at [redacted]w[redacted]d here for pre-term labor.  #Pre-term Labor: SVE in MAU after observation changed from 2 to 3 cm/80/-3. magnesium for neuroprophylaxis and tocolysis, BMZ first dose given in MAU. Urine, wet prep, GBS pending and collected in MAU. Fetus is in breech presentation - plan for breech delivery if labor proceeds.  #Pain: Per patient request #FWB: Reactive and reassuring for gestational age. Breech by BSUS in MAU. Shortened long bones on anatomy scan. #ID:  GBS unknown; result pending #MOF: both #MOC:undecided #Circ:  N/a - female  [place] P Hipkins 12/17/2017, 5:38 PM  Procardia x4 doses given in MAU for preterm  uterine contractions. Cervical exam performed by me. BMZ first dose given in MAU.   She reports contractions are not as painful after doses of procardia. Reports cramping is now 4/10 from 9/10.   Sharyon Cable, CNM 12/17/17, 7:17 PM

## 2017-12-17 NOTE — Consult Note (Signed)
Asked by V. Aundria Rudogers, CNM/Dr Adrian BlackwaterStinson to consult on Ms Meagan MedicusSchwartz  For prematurity at 33 4/7, PTL, breech presentation. Fetal US notable for hx of polyhydramnios with concern for shortened long bones on both upper and lower extremities. Ms Meagan MedicusSchwartz had Genetic counseling based on consanguinity of this baby's parents. She received a dose of betamethasone.  I spoke to Ms Meagan MedicusSchwartz in her room with the FOB present. At this point Ms Meagan MedicusSchwartz was very anxious and uncomfortable about labor that I had to make the consult brief. I discussed the NICU team being present at delivery to take care of the baby. I talked about stabilization of the baby after birth and necessity to admit to NICU.  Thank you for asking us to be involved in her care before the baby is born.  Meagan Garfinkelita Q Adison Jerger MD Neonatologist

## 2017-12-17 NOTE — Progress Notes (Signed)
HIGH-RISK PREGNANCY VISIT Patient name: Meagan [name] Warnick MRN [id number]  Date of birth: 12/31/1990 Chief Complaint:   High Risk Gestation (passed blood show and cramping)  History of Present Illness:   Meagan [name] Mangano is a 27 y.o. [phone] female at 6040w4d with an Estimated Date of Delivery: 01/31/18 being seen today for ongoing management of a high-risk pregnancy complicated by POLYHYDRAMNIOS.  Today she reports being woken up with contractions q 3-5 mintues, cramping really bad, getting worse. . Contractions: Irregular.  .  Movement: Present. denies leaking of fluid.  Review of Systems:   Pertinent items are noted in HPI Denies abnormal vaginal discharge w/ itching/odor/irritation, headaches, visual changes, shortness of breath, chest pain, abdominal pain, severe nausea/vomiting, or problems with urination or bowel movements unless otherwise stated above.    Pertinent History Reviewed:  Medical & Surgical Hx:   Past Medical History:  Diagnosis Date  . Abdominal cramps 12/06/2015  . Anxiety   . Asthma    inhaler at bedside  . Daily headache   . Diarrhea 06/04/2013  . Hemiplegic migraine    blurred vision, nausea, dizziness, numbness  . Kidney stones   . Mental disorder    huffed inhalants "affected brain"  . Pyelonephritis complicating pregnancy, antepartum, first trimester 11/05/2011  . Stomach ulcer    Past Surgical History:  Procedure Laterality Date  . ANKLE SURGERY Left 09/2016   plates & screws   Family History  Problem Relation Age of Onset  . Cancer Maternal Grandmother        breast  . Cancer Paternal Grandfather        lung cancer  . Migraines Father   . Hypertension Father   . Hypercholesterolemia Father   . Other Son        ventricular defect  . Hypertension Other     Current Outpatient Medications:  .  acetaminophen (TYLENOL) 500 MG tablet, Take 1,000 mg by mouth every 6 (six) hours as needed for moderate pain., Disp: , Rfl:  .  ALBUTEROL IN, Inhale  into the lungs as needed., Disp: , Rfl:  .  ALPRAZolam (XANAX) 0.5 MG tablet, Take 1 tablet (0.5 mg total) by mouth 3 (three) times daily as needed. for anxiety, Disp: 90 tablet, Rfl: 3 .  nortriptyline (PAMELOR) 25 MG capsule, Take 1 capsule (25 mg total) by mouth at bedtime., Disp: 90 capsule, Rfl: 4 .  Prenatal Vit-Fe Fumarate-FA (PNV PRENATAL PLUS MULTIVITAMIN) 27-1 MG TABS, Take 1 tablet by mouth daily., Disp: 30 tablet, Rfl: 11  Current Facility-Administered Medications:  .  miconazole (MONISTAT 7) 2 % vaginal cream 1 Applicatorful, 1 Applicatorful, Vaginal, QHS, Tilda BurrowFerguson, John V, MD Social History: Reviewed -  reports that she has been smoking cigarettes.  She has a 7.00 pack-year smoking history. She has never used smokeless tobacco.   Physical Assessment:   Vitals:   12/17/17 1151  BP: 124/79  Pulse: (!) 120  Weight: 194 lb 6.4 oz (88.2 kg)  Body mass index is 35.56 kg/m.           Physical Examination:   General appearance: alert, well appearing, and in no distress  Mental status: alert, oriented to person, place, and time  Skin: warm & dry   Extremities: Edema: Trace    Cardiovascular: normal heart rate noted  Respiratory: normal respiratory effort, no distress  Abdomen: gravid, soft, non-tender  Pelvic: Cervical exam performed         Fetal Status:     Movement: Present  Fetal Surveillance Testing today: NST;  Baby moving very much, difficult to trace. Ctx q 3-4 minutes   Results for orders placed or performed in visit on 12/17/17 (from the past 24 hour(s))  POCT urinalysis dipstick   Collection Time: 12/17/17 11:54 AM  Result Value Ref Range   Color, UA     Clarity, UA     Glucose, UA Negative Negative   Bilirubin, UA     Ketones, UA neg    Spec Grav, UA  1.010 - 1.025   Blood, UA neg    pH, UA  5.0 - 8.0   Protein, UA Positive (A) Negative   Urobilinogen, UA  0.2 or 1.0 E.U./dL   Nitrite, UA neg    Leukocytes, UA Negative Negative   Appearance      Odor      Assessment & Plan:  1) High-risk pregnancy G4P3003 at [redacted]w[redacted]d with an Estimated Date of Delivery: 01/31/18   2) Polyydramnios,   3) cramping/contracting, To MAU for EFM, possibly procardia, R/O PTL vs uterine overdistention. FFN not done because pt states she saw some bloody mucus and digitally inspected her vagina to make sure it was coming from there.    Follow-up: Return for weekly BPPs/HROB on Fridays, As scheduled.  Orders Placed This Encounter  Procedures  . POCT urinalysis dipstick   Jacklyn Shell CNM 12/17/2017 12:31 PM

## 2017-12-18 ENCOUNTER — Encounter (HOSPITAL_COMMUNITY): Payer: Self-pay | Admitting: Anesthesiology

## 2017-12-18 ENCOUNTER — Inpatient Hospital Stay (HOSPITAL_COMMUNITY): Payer: Medicaid Other | Admitting: Anesthesiology

## 2017-12-18 ENCOUNTER — Encounter (HOSPITAL_COMMUNITY)
Admission: AD | Disposition: A | Discharge status: Home / Self Care | Payer: Self-pay | Source: Home / Self Care | Attending: Family Medicine

## 2017-12-18 DIAGNOSIS — O4700 False labor before 37 completed weeks of gestation, unspecified trimester: Secondary | ICD-10-CM | POA: Diagnosis present

## 2017-12-18 DIAGNOSIS — Z79899 Other long term (current) drug therapy: Secondary | ICD-10-CM

## 2017-12-18 LAB — CBC
HCT: 30.4 % — ABNORMAL LOW (ref 36.0–46.0)
Hemoglobin: 10.2 g/dL — ABNORMAL LOW (ref 12.0–15.0)
MCH: 31.9 pg (ref 26.0–34.0)
MCHC: 33.6 g/dL (ref 30.0–36.0)
MCV: 95 fL (ref 78.0–100.0)
Platelets: 176 10*3/uL (ref 150–400)
RBC: 3.2 MIL/uL — ABNORMAL LOW (ref 3.87–5.11)
RDW: 14.6 % (ref 11.5–15.5)
WBC: 11.5 10*3/uL — ABNORMAL HIGH (ref 4.0–10.5)

## 2017-12-18 LAB — SYPHILIS: RPR W/REFLEX TO RPR TITER AND TREPONEMAL ANTIBODIES, TRADITIONAL SCREENING AND DIAGNOSIS ALGORITHM: RPR Ser Ql: NONREACTIVE

## 2017-12-18 SURGERY — Surgical Case

## 2017-12-18 MED ORDER — MORPHINE SULFATE (PF) 0.5 MG/ML IJ SOLN
INTRAMUSCULAR | Status: AC
  Filled 2017-12-18: qty 10

## 2017-12-18 MED ORDER — OXYTOCIN 10 UNIT/ML IJ SOLN
INTRAMUSCULAR | Status: AC
  Filled 2017-12-18: qty 4

## 2017-12-18 MED ORDER — NICOTINE 21 MG/24HR TD PT24
21.0000 mg | MEDICATED_PATCH | Freq: Every day | TRANSDERMAL | Status: DC
  Administered 2017-12-18 (×2): 21 mg via TRANSDERMAL
  Filled 2017-12-18 (×4): qty 1

## 2017-12-18 MED ORDER — LORAZEPAM 0.5 MG PO TABS
0.5000 mg | ORAL_TABLET | Freq: Once | ORAL | Status: AC
  Administered 2017-12-18: 0.5 mg via ORAL
  Filled 2017-12-18: qty 1

## 2017-12-18 MED ORDER — DIPHENHYDRAMINE HCL 50 MG/ML IJ SOLN: 12.5000 mg | INTRAMUSCULAR | Status: DC | PRN

## 2017-12-18 MED ORDER — BETAMETHASONE SOD PHOS & ACET 6 (3-3) MG/ML IJ SUSP
12.0000 mg | Freq: Once | INTRAMUSCULAR | Status: AC
  Administered 2017-12-18: 12 mg via INTRAMUSCULAR
  Filled 2017-12-18: qty 2

## 2017-12-18 MED ORDER — LIDOCAINE-EPINEPHRINE (PF) 2 %-1:200000 IJ SOLN
INTRAMUSCULAR | Status: AC
  Filled 2017-12-18: qty 20

## 2017-12-18 MED ORDER — LACTATED RINGERS IV SOLN
500.0000 mL | Freq: Once | INTRAVENOUS | Status: AC
  Administered 2017-12-18: 500 mL via INTRAVENOUS

## 2017-12-18 MED ORDER — ONDANSETRON HCL 4 MG/2ML IJ SOLN
INTRAMUSCULAR | Status: AC
  Filled 2017-12-18: qty 2

## 2017-12-18 MED ORDER — PHENYLEPHRINE 40 MCG/ML (10ML) SYRINGE FOR IV PUSH (FOR BLOOD PRESSURE SUPPORT): 80.0000 ug | PREFILLED_SYRINGE | INTRAVENOUS | Status: DC | PRN

## 2017-12-18 MED ORDER — PHENYLEPHRINE 40 MCG/ML (10ML) SYRINGE FOR IV PUSH (FOR BLOOD PRESSURE SUPPORT)
80.0000 ug | PREFILLED_SYRINGE | INTRAVENOUS | Status: DC | PRN
  Filled 2017-12-18: qty 10

## 2017-12-18 MED ORDER — EPHEDRINE 5 MG/ML INJ: 10.0000 mg | INTRAVENOUS | Status: DC | PRN

## 2017-12-18 MED ORDER — SODIUM BICARBONATE 8.4 % IV SOLN
INTRAVENOUS | Status: AC
  Filled 2017-12-18: qty 50

## 2017-12-18 MED ORDER — OXYCODONE-ACETAMINOPHEN 5-325 MG PO TABS
1.0000 | ORAL_TABLET | Freq: Once | ORAL | Status: AC
  Administered 2017-12-18: 1 via ORAL
  Filled 2017-12-18: qty 1

## 2017-12-18 MED ORDER — FENTANYL 2.5 MCG/ML BUPIVACAINE 1/10 % EPIDURAL INFUSION (WH - ANES)
14.0000 mL/h | INTRAMUSCULAR | Status: DC | PRN
  Filled 2017-12-18: qty 100

## 2017-12-18 MED ORDER — NICOTINE POLACRILEX 2 MG MT GUM
2.0000 mg | CHEWING_GUM | OROMUCOSAL | Status: DC | PRN
  Administered 2017-12-18: 2 mg via ORAL
  Filled 2017-12-18 (×2): qty 1

## 2017-12-18 MED ORDER — HYDROXYZINE HCL 50 MG PO TABS
50.0000 mg | ORAL_TABLET | Freq: Once | ORAL | Status: AC
  Administered 2017-12-18: 50 mg via ORAL
  Filled 2017-12-18: qty 1

## 2017-12-18 NOTE — Anesthesia Preprocedure Evaluation (Addendum)
Anesthesia Evaluation  Patient identified by MRN, date of birth, ID band Patient awake    Reviewed: Allergy & Precautions, Patient's Chart, lab work & pertinent test results  Airway Mallampati: III  TM Distance: >3 FB Neck ROM: Full    Dental no notable dental hx. (+) Teeth Intact   Pulmonary asthma , Current Smoker,    Pulmonary exam normal breath sounds clear to auscultation       Cardiovascular negative cardio ROS   Rhythm:Regular Rate:Tachycardia     Neuro/Psych  Headaches, PSYCHIATRIC DISORDERS Anxiety Mental deficiency   GI/Hepatic Neg liver ROS, PUD,   Endo/Other  Obesity  Renal/GU Renal diseaseHx/o renal calculi Hx/o Pyelonephritis  negative genitourinary   Musculoskeletal negative musculoskeletal ROS (+)   Abdominal (+) + obese,   Peds  Hematology  (+) anemia ,   Anesthesia Other Findings   Reproductive/Obstetrics (+) Pregnancy 33+ weeks Vaginal bleeding Unstable lie Marginal placental abruption                            Anesthesia Physical Anesthesia Plan  ASA: III  Anesthesia Plan: Epidural   Post-op Pain Management:    Induction:   PONV Risk Score and Plan:   Airway Management Planned:   Additional Equipment:   Intra-op Plan:   Post-operative Plan:   Informed Consent: I have reviewed the patients History and Physical, chart, labs and discussed the procedure including the risks, benefits and alternatives for the proposed anesthesia with the patient or authorized representative who has indicated his/her understanding and acceptance.   Dental advisory given  Plan Discussed with: Anesthesiologist  Anesthesia Plan Comments: (Will insert "Dry" catheter given her unstable condition. If she has another episode of vaginal bleeding OB to take to OR for C/Section.)        Anesthesia Quick Evaluation

## 2017-12-18 NOTE — Progress Notes (Addendum)
Patient ID: Meagan PellegriniCatherine P [name], female   DOB: 05/23/1991, [age] y.o.   MRN: [id number] Meagan [name] is a 27 y.o. [phone] at 3424w5d admitted for Preterm labor Called by RN to assess d/t bleeding, increased pain/pressure  Subjective: Very anxious, concerned about bleeding, feeling increased pain/pressure  Objective: BP (!) 101/57   Pulse (!) 102   Temp 97.8 F (36.6 C) (Oral)   Resp 14   Ht 5\' 2"  (1.575 m)   Wt 88.9 kg (196 lb)   LMP 04/26/2017 (Approximate)   SpO2 100%   BMI 35.85 kg/m  Total I/O In: 1600 [P.O.:850; I.V.:750] Out: 1175 [Urine:1175]  FHT:  FHR: 125 bpm, variability: moderate,  accelerations:  Present,  decelerations:  Absent Sm-mod amt BRB on pad, few small clots Maternal and FHR very close, unable to confidently say tracing FHR, called for bs u/s, breech-[facility]- as he was coming in I was able to see FHR on u/s at good rate, he thought placenta looked close to cx- so formal u/s was ordered for confirmation.  UC:  q 5mins  SVE:  Outer os 3cm,unable to reach inner os/thick/ballotable  Labs: Lab Results  Component Value Date   WBC 12.4 (H) 12/17/2017   HGB 11.9 (L) 12/17/2017   HCT 35.2 (L) 12/17/2017   MCV 93.4 12/17/2017   PLT 193 12/17/2017    Assessment / Plan: Preterm labor, no cervical change, now w/ sm-mod amt BRB and few clots, suspect partial marginal abruption, mom & fetus stable, continue to monitor. S/P BMZ @ 1529 Mag for CP prophylaxis  Labor: no change Fetal Wellbeing:  Category I Pain Control:  Labor support without medications Pre-eclampsia: n/a I/D:  Vanc for preterm/GBS unknown, PCN allergy (hives/sob) Anticipated MOD:  NSVD  Meagan [name] CNM, [facility] 12/17/17  Formal u/s: Fetus now VTX! AFI 29cm, placental tip 9.75cm from cx Meagan [name], CNM, [facility] 12/18/2017 2:05 AM

## 2017-12-18 NOTE — Anesthesia Procedure Notes (Signed)
Epidural Patient location during procedure: OB Start time: 12/18/2017 4:47 PM End time: 12/18/2017 5:04 PM  Staffing Anesthesiologist: Lowella CurbMiller, Atziri Zubiate Ray, MD Performed: anesthesiologist   Preanesthetic Checklist Completed: patient identified, site marked, surgical consent, pre-op evaluation, timeout performed, IV checked, risks and benefits discussed and monitors and equipment checked  Epidural Patient position: sitting Prep: ChloraPrep Patient monitoring: heart rate, cardiac monitor, continuous pulse ox and blood pressure Approach: midline Location: L2-L3 Injection technique: LOR saline  Needle:  Needle type: Tuohy  Needle gauge: 17 G Needle length: 9 cm Needle insertion depth: 6 cm Catheter type: closed end flexible Catheter size: 20 Guage Catheter at skin depth: 10 cm Test dose: negative  Assessment Events: blood not aspirated, injection not painful, no injection resistance, negative IV test and no paresthesia  Additional Notes Reason for block:procedure for pain

## 2017-12-18 NOTE — Progress Notes (Signed)
Upon entering the room pt found crying uncontrollably--c/o anxiety and needing to smoke and "Get out of this room"-pt offered PRN ativan as well as additional xanax dose-pt refuses at this time-pt taken to sit outside of room in wheelchair--contacted MD and nicotine gum ordered as well-pt verbalizes that she feels a little better

## 2017-12-18 NOTE — Progress Notes (Signed)
Patient ID: Meagan PellegriniCatherine P [name], female   DOB: 03/03/1991, [age] y.o.   MRN: [id number] Meagan PellegriniCatherine P [name] is a 27 y.o. [phone] at 2833w5d admitted for Preterm labor  Subjective: Uncomfortable w/ uc's, pressure  Objective: BP 119/76   Pulse (!) 109   Temp 97.8 F (36.6 C) (Oral)   Resp 16   Ht 5\' 2"  (1.575 m)   Wt 88.9 kg (196 lb)   LMP 04/26/2017 (Approximate)   SpO2 100%   BMI 35.85 kg/m  Total I/O In: 755 [P.O.:180; I.V.:375; IV Piggyback:200] Out: 400 [Urine:400]  FHT:  FHR: 125 bpm, variability: moderate,  accelerations:  Present,  decelerations:  Absent UC:   irregular  SVE:   Dilation: 3 Effacement (%): Thick Station: Ballotable Exam by:: Joellyn HaffKim Madelein Mahadeo, CNM Vtx  sm-mod amt BRB on towel and exam glove  Labs: Lab Results  Component Value Date   WBC 12.4 (H) 12/17/2017   HGB 11.9 (L) 12/17/2017   HCT 35.2 (L) 12/17/2017   MCV 93.4 12/17/2017   PLT 193 12/17/2017    Assessment / Plan: Admitted for PTL, no cervical change since admission, is having sm-mod amt BRB- suspected partial marginal abruption, pt/fetus stable, pt got caught smoking in bathroom earlier in night- has been ordered a nicotine patch  Labor: no change Fetal Wellbeing:  Category I Pain Control:  Labor support without medications Pre-eclampsia: n/a I/D:  Vanc for preterm/GBS unknown Anticipated MOD:  NSVD  Cheral MarkerKimberly R Ronie Fleeger CNM, WHNP-BC 12/18/2017, 9:33 AM

## 2017-12-18 NOTE — Progress Notes (Signed)
Labor Progress Note [name] is a 27 y.o. [phone] at 3935w5d presented for bleeding  S: I was called emergently due to inability to trace FHR and passage of clots.   O:  BP 121/62   Pulse (!) 118   Temp 98 F (36.7 C) (Oral)   Resp 18   Ht 5\' 2"  (1.575 m)   Wt 196 lb (88.9 kg)   LMP 04/26/2017 (Approximate)   SpO2 100%   BMI 35.85 kg/m  EFM: 145/mod/+accels, no decelerations. Significant movement on bedside US which was used to locate and visualize the fetal heart rate.  Infant moved from transverse to breech while I was using the US.    Several large clots evacuated from the vagina.  (170cc by weight) CVE: Dilation: 3 Effacement (%): Thick Station: Ballotable Presentation: (Breech) Exam by:: Dr. Steffanie Rainwateregel   A&P: 27 y.o. [id number] 7335w5d presenting with bleeding and contractions.  #Bleeding, likely abruption: Discussed abruption with patient. Currently reassuring fetal status. Will continue to monitor. If any additional bleeding, I discussed with patient that I would recommend delivery by CS. She is currently remote from delivery from a cervical dilation standpoint. I consented the patient for primary CS for fetal indications. We discussed procedure, risks and benefits. Patient voiced understanding and was given ample opportunities to ask questions.   #Unstable lie: fetus is not well applied and has changed positioned several times during this hospitalization. We discussed that currently infant is breech and would necessitate delivery by CS  #FWB: s/p BMZ x2. Cat I currently with low risk of fetal acidemia.   #Pain: Plan for epidural placement given concern for operative delivery.   Mode of Delivery: Guarded.   Federico FlakeKimberly Niles Deshan Hemmelgarn, MD 4:17 PM

## 2017-12-19 ENCOUNTER — Encounter (HOSPITAL_COMMUNITY)
Admission: AD | Disposition: A | Discharge status: Home / Self Care | Payer: Self-pay | Source: Home / Self Care | Attending: Family Medicine

## 2017-12-19 ENCOUNTER — Encounter (HOSPITAL_COMMUNITY): Payer: Self-pay | Admitting: Obstetrics & Gynecology

## 2017-12-19 ENCOUNTER — Other Ambulatory Visit: Payer: Self-pay

## 2017-12-19 DIAGNOSIS — O403XX0 Polyhydramnios, third trimester, not applicable or unspecified: Secondary | ICD-10-CM

## 2017-12-19 DIAGNOSIS — Z3A33 33 weeks gestation of pregnancy: Secondary | ICD-10-CM

## 2017-12-19 DIAGNOSIS — O321XX0 Maternal care for breech presentation, not applicable or unspecified: Secondary | ICD-10-CM

## 2017-12-19 DIAGNOSIS — O4593 Premature separation of placenta, unspecified, third trimester: Secondary | ICD-10-CM

## 2017-12-19 LAB — CULTURE, BETA STREP (GROUP B ONLY)

## 2017-12-19 SURGERY — Surgical Case
Anesthesia: Epidural

## 2017-12-19 MED ORDER — ALBUTEROL SULFATE (2.5 MG/3ML) 0.083% IN NEBU: 3.0000 mL | INHALATION_SOLUTION | Freq: Four times a day (QID) | RESPIRATORY_TRACT | Status: DC | PRN

## 2017-12-19 MED ORDER — SODIUM CHLORIDE 0.9 % IR SOLN
Status: DC | PRN
  Administered 2017-12-19: 1

## 2017-12-19 MED ORDER — OXYCODONE-ACETAMINOPHEN 5-325 MG PO TABS: 2.0000 | ORAL_TABLET | ORAL | Status: DC | PRN

## 2017-12-19 MED ORDER — ONDANSETRON HCL 4 MG/2ML IJ SOLN
INTRAMUSCULAR | Status: DC | PRN
  Administered 2017-12-19: 4 mg via INTRAVENOUS

## 2017-12-19 MED ORDER — MORPHINE SULFATE (PF) 0.5 MG/ML IJ SOLN
INTRAMUSCULAR | Status: AC
  Filled 2017-12-19: qty 10

## 2017-12-19 MED ORDER — WITCH HAZEL-GLYCERIN EX PADS: 1.0000 "application " | MEDICATED_PAD | CUTANEOUS | Status: DC | PRN

## 2017-12-19 MED ORDER — ALPRAZOLAM 0.5 MG PO TABS
0.5000 mg | ORAL_TABLET | Freq: Three times a day (TID) | ORAL | Status: DC | PRN
  Administered 2017-12-19 – 2017-12-20 (×5): 0.5 mg via ORAL
  Filled 2017-12-19 (×5): qty 1

## 2017-12-19 MED ORDER — DEXAMETHASONE SODIUM PHOSPHATE 10 MG/ML IJ SOLN
INTRAMUSCULAR | Status: DC | PRN
  Administered 2017-12-19: 10 mg via INTRAVENOUS

## 2017-12-19 MED ORDER — SCOPOLAMINE 1 MG/3DAYS TD PT72
MEDICATED_PATCH | TRANSDERMAL | Status: DC | PRN
  Administered 2017-12-19: 1 via TRANSDERMAL

## 2017-12-19 MED ORDER — ZOLPIDEM TARTRATE 5 MG PO TABS: 5.0000 mg | ORAL_TABLET | Freq: Every evening | ORAL | Status: DC | PRN

## 2017-12-19 MED ORDER — OXYCODONE HCL 5 MG PO TABS: 5.0000 mg | ORAL_TABLET | Freq: Once | ORAL | Status: DC | PRN

## 2017-12-19 MED ORDER — ACETAMINOPHEN 325 MG PO TABS: 650.0000 mg | ORAL_TABLET | ORAL | Status: DC | PRN

## 2017-12-19 MED ORDER — LACTATED RINGERS IV SOLN
INTRAVENOUS | Status: DC | PRN
  Administered 2017-12-19 (×2): via INTRAVENOUS

## 2017-12-19 MED ORDER — LACTATED RINGERS IV SOLN
INTRAVENOUS | Status: DC
  Administered 2017-12-19: 10:00:00 via INTRAVENOUS

## 2017-12-19 MED ORDER — SODIUM BICARBONATE 8.4 % IV SOLN
INTRAVENOUS | Status: DC | PRN
  Administered 2017-12-19: 10 mL via EPIDURAL
  Administered 2017-12-19 (×2): 5 mL via EPIDURAL

## 2017-12-19 MED ORDER — OXYCODONE HCL 5 MG/5ML PO SOLN: 5.0000 mg | Freq: Once | ORAL | Status: DC | PRN

## 2017-12-19 MED ORDER — MIDAZOLAM HCL 2 MG/2ML IJ SOLN
INTRAMUSCULAR | Status: AC
  Filled 2017-12-19: qty 2

## 2017-12-19 MED ORDER — OXYTOCIN 40 UNITS IN LACTATED RINGERS INFUSION - SIMPLE MED: 2.5000 [IU]/h | INTRAVENOUS | Status: AC

## 2017-12-19 MED ORDER — DIBUCAINE 1 % RE OINT: 1.0000 "application " | TOPICAL_OINTMENT | RECTAL | Status: DC | PRN

## 2017-12-19 MED ORDER — OXYCODONE-ACETAMINOPHEN 5-325 MG PO TABS: 1.0000 | ORAL_TABLET | ORAL | Status: DC | PRN

## 2017-12-19 MED ORDER — IBUPROFEN 600 MG PO TABS
600.0000 mg | ORAL_TABLET | Freq: Four times a day (QID) | ORAL | Status: DC
  Administered 2017-12-19 – 2017-12-21 (×10): 600 mg via ORAL
  Filled 2017-12-19 (×10): qty 1

## 2017-12-19 MED ORDER — MORPHINE SULFATE (PF) 0.5 MG/ML IJ SOLN
INTRAMUSCULAR | Status: DC | PRN
  Administered 2017-12-19: 4 mg via EPIDURAL
  Administered 2017-12-19: 1 mg via EPIDURAL

## 2017-12-19 MED ORDER — NALOXONE HCL 4 MG/10ML IJ SOLN
1.0000 ug/kg/h | INTRAVENOUS | Status: DC | PRN
  Filled 2017-12-19: qty 5

## 2017-12-19 MED ORDER — DIPHENHYDRAMINE HCL 25 MG PO CAPS: 25.0000 mg | ORAL_CAPSULE | Freq: Four times a day (QID) | ORAL | Status: DC | PRN

## 2017-12-19 MED ORDER — SENNOSIDES-DOCUSATE SODIUM 8.6-50 MG PO TABS
2.0000 | ORAL_TABLET | ORAL | Status: DC
  Administered 2017-12-20 – 2017-12-21 (×2): 2 via ORAL
  Filled 2017-12-19 (×2): qty 2

## 2017-12-19 MED ORDER — SCOPOLAMINE 1 MG/3DAYS TD PT72: 1.0000 | MEDICATED_PATCH | Freq: Once | TRANSDERMAL | Status: DC

## 2017-12-19 MED ORDER — NALBUPHINE HCL 10 MG/ML IJ SOLN: 5.0000 mg | INTRAMUSCULAR | Status: DC | PRN

## 2017-12-19 MED ORDER — TETANUS-DIPHTH-ACELL PERTUSSIS 5-2.5-18.5 LF-MCG/0.5 IM SUSP: 0.5000 mL | Freq: Once | INTRAMUSCULAR | Status: DC

## 2017-12-19 MED ORDER — NALBUPHINE HCL 10 MG/ML IJ SOLN: 5.0000 mg | Freq: Once | INTRAMUSCULAR | Status: DC | PRN

## 2017-12-19 MED ORDER — SIMETHICONE 80 MG PO CHEW
80.0000 mg | CHEWABLE_TABLET | Freq: Three times a day (TID) | ORAL | Status: DC
  Administered 2017-12-19 – 2017-12-21 (×8): 80 mg via ORAL
  Filled 2017-12-19 (×8): qty 1

## 2017-12-19 MED ORDER — METHYLERGONOVINE MALEATE 0.2 MG/ML IJ SOLN: 0.2000 mg | INTRAMUSCULAR | Status: DC | PRN

## 2017-12-19 MED ORDER — SIMETHICONE 80 MG PO CHEW: 80.0000 mg | CHEWABLE_TABLET | ORAL | Status: DC | PRN

## 2017-12-19 MED ORDER — SIMETHICONE 80 MG PO CHEW
80.0000 mg | CHEWABLE_TABLET | ORAL | Status: DC
  Administered 2017-12-20 – 2017-12-21 (×2): 80 mg via ORAL
  Filled 2017-12-19 (×2): qty 1

## 2017-12-19 MED ORDER — KETOROLAC TROMETHAMINE 30 MG/ML IJ SOLN: 30.0000 mg | Freq: Four times a day (QID) | INTRAMUSCULAR | Status: AC | PRN

## 2017-12-19 MED ORDER — NALOXONE HCL 0.4 MG/ML IJ SOLN: 0.4000 mg | INTRAMUSCULAR | Status: DC | PRN

## 2017-12-19 MED ORDER — COCONUT OIL OIL
1.0000 "application " | TOPICAL_OIL | Status: DC | PRN
  Administered 2017-12-20: 1 via TOPICAL
  Filled 2017-12-19: qty 120

## 2017-12-19 MED ORDER — FENTANYL CITRATE (PF) 100 MCG/2ML IJ SOLN: 25.0000 ug | INTRAMUSCULAR | Status: DC | PRN

## 2017-12-19 MED ORDER — MEPERIDINE HCL 25 MG/ML IJ SOLN: 6.2500 mg | INTRAMUSCULAR | Status: DC | PRN

## 2017-12-19 MED ORDER — PROPOFOL 10 MG/ML IV BOLUS
INTRAVENOUS | Status: DC | PRN
  Administered 2017-12-19: 2 mg via INTRAVENOUS
  Administered 2017-12-19: 5 mg via INTRAVENOUS

## 2017-12-19 MED ORDER — PROMETHAZINE HCL 25 MG/ML IJ SOLN: 6.2500 mg | INTRAMUSCULAR | Status: DC | PRN

## 2017-12-19 MED ORDER — ONDANSETRON HCL 4 MG/2ML IJ SOLN: 4.0000 mg | Freq: Three times a day (TID) | INTRAMUSCULAR | Status: DC | PRN

## 2017-12-19 MED ORDER — DIPHENHYDRAMINE HCL 25 MG PO CAPS
25.0000 mg | ORAL_CAPSULE | ORAL | Status: DC | PRN
  Filled 2017-12-19: qty 1

## 2017-12-19 MED ORDER — MENTHOL 3 MG MT LOZG: 1.0000 | LOZENGE | OROMUCOSAL | Status: DC | PRN

## 2017-12-19 MED ORDER — MIDAZOLAM HCL 2 MG/2ML IJ SOLN
INTRAMUSCULAR | Status: DC | PRN
  Administered 2017-12-19: 2 mg via INTRAVENOUS

## 2017-12-19 MED ORDER — SODIUM CHLORIDE 0.9% FLUSH: 3.0000 mL | INTRAVENOUS | Status: DC | PRN

## 2017-12-19 MED ORDER — PRENATAL MULTIVITAMIN CH
1.0000 | ORAL_TABLET | Freq: Every day | ORAL | Status: DC
  Administered 2017-12-19 – 2017-12-21 (×3): 1 via ORAL
  Filled 2017-12-19 (×3): qty 1

## 2017-12-19 MED ORDER — NORTRIPTYLINE HCL 25 MG PO CAPS
25.0000 mg | ORAL_CAPSULE | Freq: Every day | ORAL | Status: DC
  Administered 2017-12-19 – 2017-12-20 (×2): 25 mg via ORAL
  Filled 2017-12-19 (×2): qty 1

## 2017-12-19 MED ORDER — CEFAZOLIN SODIUM-DEXTROSE 2-3 GM-%(50ML) IV SOLR
INTRAVENOUS | Status: DC | PRN
  Administered 2017-12-19: 2 g via INTRAVENOUS

## 2017-12-19 MED ORDER — METHYLERGONOVINE MALEATE 0.2 MG PO TABS: 0.2000 mg | ORAL_TABLET | ORAL | Status: DC | PRN

## 2017-12-19 MED ORDER — PNV PRENATAL PLUS MULTIVITAMIN 27-1 MG PO TABS: 1.0000 | ORAL_TABLET | Freq: Every day | ORAL | Status: DC

## 2017-12-19 MED ORDER — DIPHENHYDRAMINE HCL 50 MG/ML IJ SOLN: 12.5000 mg | INTRAMUSCULAR | Status: DC | PRN

## 2017-12-19 MED ORDER — PHENYLEPHRINE HCL 10 MG/ML IJ SOLN
INTRAMUSCULAR | Status: DC | PRN
  Administered 2017-12-19 (×2): 80 ug via INTRAVENOUS
  Administered 2017-12-19: 120 ug via INTRAVENOUS

## 2017-12-19 SURGICAL SUPPLY — 39 items
ADH SKN CLS APL DERMABOND .7 (GAUZE/BANDAGES/DRESSINGS) ×1
CHLORAPREP W/TINT 26ML (MISCELLANEOUS) ×4 IMPLANT
CLAMP CORD UMBIL (MISCELLANEOUS) IMPLANT
CLOTH BEACON ORANGE TIMEOUT ST (SAFETY) ×2 IMPLANT
DERMABOND ADVANCED (GAUZE/BANDAGES/DRESSINGS) ×1
DERMABOND ADVANCED .7 DNX12 (GAUZE/BANDAGES/DRESSINGS) ×2 IMPLANT
DRSG OPSITE POSTOP 4X10 (GAUZE/BANDAGES/DRESSINGS) ×2 IMPLANT
ELECT REM PT RETURN 9FT ADLT (ELECTROSURGICAL) ×2
ELECTRODE REM PT RTRN 9FT ADLT (ELECTROSURGICAL) ×1 IMPLANT
EXTRACTOR VACUUM BELL STYLE (SUCTIONS) IMPLANT
GLOVE BIOGEL PI IND STRL 7.0 (GLOVE) ×1 IMPLANT
GLOVE BIOGEL PI IND STRL 8 (GLOVE) ×1 IMPLANT
GLOVE BIOGEL PI INDICATOR 7.0 (GLOVE) ×1
GLOVE BIOGEL PI INDICATOR 8 (GLOVE) ×1
GLOVE ECLIPSE 8.0 STRL XLNG CF (GLOVE) ×2 IMPLANT
GOWN STRL REUS W/TWL LRG LVL3 (GOWN DISPOSABLE) ×4 IMPLANT
KIT ABG SYR 3ML LUER SLIP (SYRINGE) ×2 IMPLANT
NDL HYPO 18GX1.5 BLUNT FILL (NEEDLE) ×1 IMPLANT
NDL HYPO 25X5/8 SAFETYGLIDE (NEEDLE) ×1 IMPLANT
NEEDLE HYPO 18GX1.5 BLUNT FILL (NEEDLE) ×2 IMPLANT
NEEDLE HYPO 22GX1.5 SAFETY (NEEDLE) ×2 IMPLANT
NEEDLE HYPO 25X5/8 SAFETYGLIDE (NEEDLE) ×2 IMPLANT
NS IRRIG 1000ML POUR BTL (IV SOLUTION) ×2 IMPLANT
PACK C SECTION WH (CUSTOM PROCEDURE TRAY) ×2 IMPLANT
PAD OB MATERNITY 4.3X12.25 (PERSONAL CARE ITEMS) ×2 IMPLANT
PENCIL SMOKE EVAC W/HOLSTER (ELECTROSURGICAL) ×2 IMPLANT
RTRCTR C-SECT PINK 25CM LRG (MISCELLANEOUS) IMPLANT
SUT CHROMIC 0 CT 1 (SUTURE) ×2 IMPLANT
SUT MNCRL 0 VIOLET CTX 36 (SUTURE) ×2 IMPLANT
SUT MONOCRYL 0 CTX 36 (SUTURE) ×2
SUT PLAIN 2 0 (SUTURE)
SUT PLAIN 2 0 XLH (SUTURE) IMPLANT
SUT PLAIN ABS 2-0 CT1 27XMFL (SUTURE) IMPLANT
SUT VIC AB 0 CTX 36 (SUTURE) ×2
SUT VIC AB 0 CTX36XBRD ANBCTRL (SUTURE) ×1 IMPLANT
SUT VIC AB 4-0 KS 27 (SUTURE) IMPLANT
SYR 20CC LL (SYRINGE) ×4 IMPLANT
TOWEL OR 17X24 6PK STRL BLUE (TOWEL DISPOSABLE) ×2 IMPLANT
TRAY FOLEY W/BAG SLVR 14FR LF (SET/KITS/TRAYS/PACK) IMPLANT

## 2017-12-19 NOTE — Progress Notes (Signed)
LABOR PROGRESS NOTE  Princella PellegriniCatherine P Brill is a 27 y.o. [phone] at 2837w6d  admitted for vaginal bleeding  Subjective: Pt is sleeping. No report of bleeding from nurse since around 20:00. Only a dime size then.   Objective: BP (!) 92/52   Pulse 88   Temp 98 F (36.7 C) (Oral)   Resp 14   Ht 5\' 2"  (1.575 m)   Wt 196 lb (88.9 kg)   LMP 04/26/2017 (Approximate)   SpO2 96%   BMI 35.85 kg/m  or  Vitals:   12/18/17 2130 12/18/17 2200 12/18/17 2300 12/19/17 0000  BP:  (!) 110/57 (!) 102/55 (!) 92/52  Pulse:  90 (!) 101 88  Resp:  18 16 14   Temp:      TempSrc:      SpO2: 99% 100% 96% 96%  Weight:      Height:         Dilation: 3 Effacement (%): Thick Station: -3 Presentation: (Breech) Exam by:: Dr. Steffanie Rainwateregel FHT: 110, mod var, reactive no decel Uterine activity: 2-10 min Labs: Lab Results  Component Value Date   WBC 11.5 (H) 12/18/2017   HGB 10.2 (L) 12/18/2017   HCT 30.4 (L) 12/18/2017   MCV 95.0 12/18/2017   PLT 176 12/18/2017    Patient Active Problem List   Diagnosis Date Noted  . Threatened preterm labor, antepartum 12/18/2017  . 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  . Preterm uterine contractions in third trimester, antepartum 12/17/2017  . 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 Supervision of high risk pregnancy, antepartum 06/26/2017  . Polyhydramnios affecting pregnancy 05/02/2016  . Diarrhea 12/06/2015  . Hx of preeclampsia 11/16/2015  . 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 Smoker 12/16/2013  . Anxiety 06/17/2013    Assessment / Plan: 27 y.o. G4P3003 at 2737w6d here for vaginal bleeding  Labor: not in active labor  Fetal Wellbeing:  Cat I  Anticipated MOD:  guarded  Garnette GunnerAaron B Margrette Wynia, MD PGY-1 6/20/201912:23 AM

## 2017-12-19 NOTE — Op Note (Signed)
Preoperative diagnosis:  1.  Intrauterine pregnancy at [redacted]w[redacted]d  weeks gestation                                         2.  Placental abruption                                         3.  Persistent bleeding                                         4.  Polyhydramnios                                         5.  Breech presentation   Postoperative diagnosis:  Same as above + double footling breech  Procedure:  Primary cesarean section  Surgeon:  Lazaro Arms MD  Assistant:    Anesthesia: epidural  Findings:  Pt was admitted on 12/17/2017 with bleeding thought to be marginal sinus abruption.  She has continued to have some bleeding but about 3 am had large amount of fresh red blood and clots again.  As a result decision was made to proceed with Caeserean delivery, also polyhydramnios and breech/unstable lie.    At the time of surgery there was significant seperation of the placenta from the uterus, maybe 25% and the amnitoic fluid was bloody as well.    Over a low transverse incision was delivered a viable female with Apgars of 5 and 7 weighing 5 lbs. 0 oz. Uterus, tubes and ovaries were all normal.    The baby was in the double footling breech presentationThere were no other significant findings  Description of operation:  Patient was taken to the operating room and placed in the sitting position where she underwent a spinal anesthetic. She was then placed in the supine position with tilt to the left side. When adequate anesthetic level was obtained she was prepped and draped in usual sterile fashion and a Foley catheter was placed. A Pfannenstiel skin incision was made and carried down sharply to the rectus fascia which was scored in the midline extended laterally. The fascia was taken off the muscles both superiorly and without difficulty. The muscles were divided.  The peritoneal cavity was entered.  Bladder blade was placed, no bladder flap was created.  A low transverse hysterotomy incision  was made and delivered a viable female  infant at 66 with Apgars of 5 and 7 weighing5 lbs 0 oz.  Cord pH was obtained and was pebding. The uterus was exteriorized. It was closed in 2 layers, the first being a running interlocking layer and the second being an imbricating layer using 0 monocryl on a CTX needle. There was good resulting hemostasis. The uterus tubes and ovaries were all normal. Peritoneal cavity was irrigated vigorously. The muscles and peritoneum were reapproximated loosely. The fascia was closed using 0 Vicryl in running fashion. Subcutaneous tissue was made hemostatic and irrigated. The skin was closed using 4-0 Vicryl on a Keith needle in a subcuticular fashion.  Dermabond was placed for additional wound integrity and to serve as a  barrier. Blood loss for the procedure was 1000 cc. The patient received a gram of Ancef prophylactically. The patient was taken to the recovery room in good stable condition with all counts being correct x3.  EBL 1000 cc  Lazaro ArmsLuther H Eure 12/19/2017 4:23 AM

## 2017-12-19 NOTE — Transfer of Care (Signed)
Immediate Anesthesia Transfer of Care Note  Patient: Meagan PellegriniCatherine P [name]  Procedure(s) Performed: CESAREAN SECTION (N/A )  Patient Location: PACU  Anesthesia Type:Epidural  Level of Consciousness: awake, alert  and oriented  Airway & Oxygen Therapy: Patient Spontanous Breathing  Post-op Assessment: Report given to RN and Post -op Vital signs reviewed and stable  Post vital signs: Reviewed and stable  Last Vitals:  Vitals Value Taken Time  BP 91/45 12/19/2017  4:37 AM  Temp    Pulse 78 12/19/2017  4:39 AM  Resp 21 12/19/2017  4:39 AM  SpO2 99 % 12/19/2017  4:39 AM  Vitals shown include unvalidated device data.  Last Pain:  Vitals:   12/19/17 0100  TempSrc: Oral  PainSc:          Complications: No apparent anesthesia complications

## 2017-12-19 NOTE — Progress Notes (Addendum)
RN called to room by patient at 0255. Pt reported "something is coming out". Pt noted to have a golf size ball clot in between her legs. Pt requested to use the bedpan. As this RN was attempting to place pt on bedpan, two more large clots expelled. Dr. Despina HiddenEure called to bedside, c-section called.

## 2017-12-19 NOTE — Anesthesia Postprocedure Evaluation (Signed)
Anesthesia Post Note  Patient: Meagan PellegriniCatherine P Klammer  Procedure(s) Performed: CESAREAN SECTION (N/A )     Patient location during evaluation: PACU Anesthesia Type: Epidural Level of consciousness: awake and alert Pain management: pain level controlled Vital Signs Assessment: post-procedure vital signs reviewed and stable Respiratory status: spontaneous breathing and respiratory function stable Cardiovascular status: blood pressure returned to baseline and stable Postop Assessment: no apparent nausea or vomiting and epidural receding Anesthetic complications: no    Last Vitals:  Vitals:   12/19/17 0603 12/19/17 0614  BP: (!) 112/58 109/68  Pulse: 82 84  Resp: 18 19  Temp: 37.3 C 37.2 C  SpO2: 100% 100%    Last Pain:  Vitals:   12/19/17 0614  TempSrc: Oral  PainSc:    Pain Goal:                 Beryle Lathehomas E Brock

## 2017-12-19 NOTE — Addendum Note (Signed)
Addendum  created 12/19/17 0806 by Shanon PayorGregory, Anup Brigham M, CRNA   Sign clinical note

## 2017-12-19 NOTE — Anesthesia Postprocedure Evaluation (Signed)
Anesthesia Post Note  Patient: Meagan PellegriniCatherine P [name]  Procedure(s) Performed: CESAREAN SECTION (N/A )     Patient location during evaluation: Mother Baby Anesthesia Type: Epidural Level of consciousness: awake and alert and oriented Pain management: pain level controlled Vital Signs Assessment: post-procedure vital signs reviewed and stable Respiratory status: spontaneous breathing and nonlabored ventilation Cardiovascular status: stable Postop Assessment: no headache, no backache, patient able to bend at knees, no signs of nausea or vomiting, adequate PO intake and epidural receding Anesthetic complications: no    Last Vitals:  Vitals:   12/19/17 0713 12/19/17 0800  BP: 115/64 114/67  Pulse: 82 83  Resp: 20 16  Temp: 37.2 C 36.8 C  SpO2: 98% 100%    Last Pain:  Vitals:   12/19/17 0800  TempSrc: Oral  PainSc:    Pain Goal:                 Danaher CorporationREGORY,Clarene Curran

## 2017-12-19 NOTE — Consult Note (Signed)
[facility]  Delivery Note:  C-section       12/19/2017  3:12 AM  I was called to the operating room at the request of the patient's obstetrician (Dr. Despina HiddenEure) for a primary c-section.  PRENATAL HX:  This is a 27 y/o G4P3003 at 2033 and 6/[redacted] weeks gestation who was admitted for vaginal bleeding and preterm labor.  Fetal US notable for hx of polyhydramnios with concern for shortened long bones on both upper and lower extremities. She also had Genetic counseling based on consanguinity of this baby's parents. She received betamethasone x2 on 6/18 and 6/19.  She also received magnesium for preterm labor and ativan and xanax for anxiety prior to delivery.  GBS unknown with AROM at delivery.  Delivery by urgent c-section for continued bleeding.     DELIVERY:  Cord clamping delayed 30 seconds.  Infant cried initially but had mild hypotonia with intermittently poor respiratory effort.  HR decreased to ~90 bpm at around 3 minutes of age despite standard warming, drying and stimulation so PPV initiated, given for 30 seconds x2 with improvement in HR to > 100 each time.  After PPV, CPAP was given for O2 saturations in 60s.  By 10 minutes of age, O2 saturations were low 90s on CPAP +5, 40%.  APGARs 5 and 7.  Parents updated at the bedside.    _____________________ Electronically Signed By: Maryan CharLindsey Caprice Wasko, MD Neonatologist

## 2017-12-20 ENCOUNTER — Ambulatory Visit (HOSPITAL_COMMUNITY): Payer: Self-pay

## 2017-12-20 ENCOUNTER — Encounter: Payer: Medicaid Other | Admitting: Obstetrics & Gynecology

## 2017-12-20 LAB — CBC
HCT: 26.3 % — ABNORMAL LOW (ref 36.0–46.0)
Hemoglobin: 8.7 g/dL — ABNORMAL LOW (ref 12.0–15.0)
MCH: 32.2 pg (ref 26.0–34.0)
MCHC: 33.1 g/dL (ref 30.0–36.0)
MCV: 97.4 fL (ref 78.0–100.0)
Platelets: 149 10*3/uL — ABNORMAL LOW (ref 150–400)
RBC: 2.7 MIL/uL — ABNORMAL LOW (ref 3.87–5.11)
RDW: 14.7 % (ref 11.5–15.5)
WBC: 13.1 10*3/uL — ABNORMAL HIGH (ref 4.0–10.5)

## 2017-12-20 MED ORDER — DOCUSATE SODIUM 100 MG PO CAPS: 100.0000 mg | ORAL_CAPSULE | Freq: Two times a day (BID) | ORAL | Status: DC | PRN

## 2017-12-20 MED ORDER — BISACODYL 10 MG RE SUPP: 10.0000 mg | Freq: Every day | RECTAL | Status: DC | PRN

## 2017-12-20 MED ORDER — RHO D IMMUNE GLOBULIN 1500 UNIT/2ML IJ SOSY
300.0000 ug | PREFILLED_SYRINGE | Freq: Once | INTRAMUSCULAR | Status: AC
  Administered 2017-12-20: 300 ug via INTRAVENOUS
  Filled 2017-12-20: qty 2

## 2017-12-20 MED ORDER — OXYCODONE HCL 5 MG PO TABS
5.0000 mg | ORAL_TABLET | ORAL | Status: DC | PRN
  Administered 2017-12-20 – 2017-12-21 (×2): 5 mg via ORAL
  Filled 2017-12-20 (×2): qty 1

## 2017-12-20 MED ORDER — ACETAMINOPHEN 500 MG PO TABS
1000.0000 mg | ORAL_TABLET | Freq: Four times a day (QID) | ORAL | Status: DC
  Administered 2017-12-20 – 2017-12-21 (×5): 1000 mg via ORAL
  Filled 2017-12-20 (×5): qty 2

## 2017-12-20 MED ORDER — FERROUS SULFATE 325 (65 FE) MG PO TABS
325.0000 mg | ORAL_TABLET | Freq: Two times a day (BID) | ORAL | Status: DC
  Administered 2017-12-20 – 2017-12-21 (×2): 325 mg via ORAL
  Filled 2017-12-20 (×2): qty 1

## 2017-12-20 NOTE — Progress Notes (Signed)
I offered support to pt due to her having an unexpected preterm delivery and a baby in the NICU.  I offered a listening presence as she shared her birth story and her feelings about having a baby in the NICU.  She has 3 additional children (ages, 495,3 and 1) but she has never experienced anything like this before.  She described the panic attack she experienced during her C/S as well as her unexpected anger that accompanied the stress she is feeling.  We talked about her strategies for coping with the stress she is feeling.  For her, getting a change of scenery and some fresh air makes helps her to cope and I encouraged her to make use of that coping strategy as she needed over her time in the hospital.    We will continue to check in on her and her baby in the NICU, but please also page as needs arise.  Chaplain Katy Merrell Rettinger, Bcc Pager, [phone] 4:35 PM

## 2017-12-20 NOTE — Lactation Note (Signed)
This note was copied from a baby's chart. Lactation Consultation Note  Patient Name: Meagan Montey HoraCatherine Ahola WUJWJ'XToday's Date: 12/20/2017 Reason for consult: Initial assessment;NICU baby;Infant < 6lbs;Preterm <34wks;Infant weight loss  34 hours old preterm NICU female who is being fed donor milk at this point, mom is already pumping but not getting volume yet, but she has visible drops of colostrum on her pump flanges, she's a P4 and experienced BF. She was able to BF her first two children for 2 months and her last one for 5 months. She already knows how to hand express and has a Medela DEBP at home.  Mom was pumping when entering the room, LC noticed that pump tubing was lose and warned mom that she'll notice a difference in the suction level after the adjustment. She did. Mom has already pumped 4 times today but she was discouraged because she's not getting volume yet. Discussed lactogenesis II with mom and encouraged her to keep pumping about 8 times/24 hours including a session during the night. Praised her for her efforts and for providing breastmilk for her NICU baby. Reviewed breastmilk storage guidelines for NICU and for when she takes her baby home.  Requested her RN some coconut Oil since she'll be pumping at least for a few weeks; explained mom how to use it. Mom will continue pumping every 3 hours. Reviewed BF brochure, BF resources and providing breastmilk for your baby in the NICU, mom is aware of LC services and will call PRN.  Maternal Data Formula Feeding for Exclusion: Yes Reason for exclusion: Admission to Intensive Care Unit (ICU) post-partum Has patient been taught Hand Expression?: Yes Does the patient have breastfeeding experience prior to this delivery?: Yes  Feeding Feeding Type: Donor Breast Milk  Interventions Interventions: Breast feeding basics reviewed;DEBP;Coconut oil  Lactation Tools Discussed/Used Tools: Pump;Coconut oil Breast pump type: Double-Electric Breast  Pump WIC Program: Yes Pump Review: Setup, frequency, and cleaning;Milk Storage Initiated by:: RN, IBCLC Date initiated:: 12/19/17   Consult Status Consult Status: Follow-up Date: 12/21/17 Follow-up type: In-patient    Asante Blanda Venetia ConstableS Jamaar Howes 12/20/2017, 2:17 PM

## 2017-12-20 NOTE — Plan of Care (Signed)
Patient is alert and oriented x4. She is pleasant and cooperative. Patient denies any pain or discomfort at this time. She has ambulated to the NICU several times this shift without any difficulty.

## 2017-12-21 LAB — BPAM RBC
Blood Product Expiration Date: 201906302359
Blood Product Expiration Date: 201906302359
Blood Product Expiration Date: 201907192359
Blood Product Expiration Date: 201907192359
ISSUE DATE / TIME: 201906200815
ISSUE DATE / TIME: 201906212023
Unit Type and Rh: 600
Unit Type and Rh: 600
Unit Type and Rh: 600
Unit Type and Rh: 600

## 2017-12-21 LAB — RH IG WORKUP (INCLUDES ABO/RH)
ABO/RH(D): A NEG
Fetal Screen: NEGATIVE
Gestational Age(Wks): 33
Unit division: 0

## 2017-12-21 LAB — TYPE AND SCREEN
ABO/RH(D): A NEG
Antibody Screen: POSITIVE
Unit division: 0
Unit division: 0
Unit division: 0
Unit division: 0

## 2017-12-21 MED ORDER — OXYCODONE HCL 5 MG PO TABS: 5.0000 mg | ORAL_TABLET | Freq: Four times a day (QID) | ORAL | 0 refills | Status: DC | PRN

## 2017-12-21 MED ORDER — IBUPROFEN 600 MG PO TABS: 600.0000 mg | ORAL_TABLET | Freq: Four times a day (QID) | ORAL | 0 refills | Status: DC

## 2017-12-21 NOTE — Discharge Summary (Signed)
OB Discharge Summary     Patient Name: Meagan [name] DOB: June 27, 1991 MRN: [id number]  Date of admission: 12/17/2017 Delivering MD: Duane Lope H   Date of discharge: 12/21/2017  Admitting diagnosis: 34WKS,CTX Intrauterine pregnancy: [redacted]w[redacted]d     Secondary diagnosis:  Active Problems:   Smoker   Hx of preeclampsia   Polyhydramnios affecting pregnancy   Supervision of high risk pregnancy, antepartum   Shortened long bones   Preterm uterine contractions in third trimester, antepartum   Threatened preterm labor, antepartum   Chronic prescription benzodiazepine use  Additional problems:pregnancy 33w 6 d preeclampsia     Discharge diagnosis: Preterm Pregnancy Delivered                                                                                                Post partum procedures:  Augmentation:   Complications: None  Hospital course:  Onset of Labor With Unplanned C/S  27 y.o. yo [id number] at [redacted]w[redacted]d was admitted in Latent Labor on 12/17/2017. Patient had a labor course significant for pprom. Membrane Rupture Time/Date: 3:49 AM ,12/19/2017   The patient went for cesarean section due to Malpresentation, and delivered a Viable infant,12/19/2017  Details of operation can be found in separate operative note. Patient had an uncomplicated postpartum course.  She is ambulating,tolerating a regular diet, passing flatus, and urinating well.  Patient is discharged home in stable condition 12/26/17.  Physical exam  Vitals:   12/20/17 1651 12/20/17 2026 12/21/17 0001 12/21/17 0352  BP: 119/77 98/62 109/62 109/66  Pulse: 90 87 82 67  Resp: 19 18 18 18   Temp: 98.4 F (36.9 C) 98.4 F (36.9 C) 98.5 F (36.9 C)   TempSrc: Oral Oral Oral   SpO2: 98% 99% 99% 99%  Weight:      Height:       General: alert, cooperative and no distress Lochia: appropriate Uterine Fundus: firm Incision: Healing well with no significant drainage, Dressing is clean, dry, and intact DVT Evaluation: No  evidence of DVT seen on physical exam. Labs: Lab Results  Component Value Date   WBC 13.1 (H) 12/20/2017   HGB 8.7 (L) 12/20/2017   HCT 26.3 (L) 12/20/2017   MCV 97.4 12/20/2017   PLT 149 (L) 12/20/2017   CMP Latest Ref Rng & Units 12/14/2017  Glucose 65 - 99 mg/dL 956(O)  BUN 6 - 20 mg/dL 5(L)  Creatinine 1.30 - 1.00 mg/dL 8.65  Sodium 784 - 696 mmol/L 136  Potassium 3.5 - 5.1 mmol/L 3.9  Chloride 101 - 111 mmol/L 108  CO2 22 - 32 mmol/L 19(L)  Calcium 8.9 - 10.3 mg/dL 2.9(B)  Total Protein 6.5 - 8.1 g/dL 2.8(U)  Total Bilirubin 0.3 - 1.2 mg/dL <1.3(K)  Alkaline Phos 38 - 126 U/L 74  AST 15 - 41 U/L 11(L)  ALT 14 - 54 U/L 10(L)    Discharge instruction: per After Visit Summary and "Baby and Me Booklet".  After visit meds:  Allergies as of 12/21/2017      Reactions   Codeine Nausea And Vomiting, Other (See Comments)   Vertigo  Penicillins Rash, Other (See Comments)   Unknown:  Childhood reaction.  Has patient had a PCN reaction causing immediate rash, facial/tongue/throat swelling, SOB or lightheadedness with hypotension: Unknown Has patient had a PCN reaction causing severe rash involving mucus membranes or skin necrosis: Unknown Has patient had a PCN reaction that required hospitalization No Has patient had a PCN reaction occurring within the last 10 years: No If all of the above answers are "NO", then may pro   Sulfamethoxazole-trimethoprim Hives      Medication List    TAKE these medications   acetaminophen 500 MG tablet Commonly known as:  TYLENOL Take 1,000 mg by mouth every 6 (six) hours as needed for moderate pain.   albuterol 108 (90 Base) MCG/ACT inhaler Commonly known as:  PROVENTIL HFA;VENTOLIN HFA Inhale 1-2 puffs into the lungs every 6 (six) hours as needed for wheezing or shortness of breath.   ALPRAZolam 0.5 MG tablet Commonly known as:  XANAX Take 1 tablet (0.5 mg total) by mouth 3 (three) times daily as needed. for anxiety   ibuprofen 600 MG  tablet Commonly known as:  ADVIL,MOTRIN Take 1 tablet (600 mg total) by mouth every 6 (six) hours.   nortriptyline 25 MG capsule Commonly known as:  PAMELOR Take 1 capsule (25 mg total) by mouth at bedtime.   oxyCODONE 5 MG immediate release tablet Commonly known as:  Oxy IR/ROXICODONE Take 1 tablet (5 mg total) by mouth every 6 (six) hours as needed for severe pain or breakthrough pain.   PNV PRENATAL PLUS MULTIVITAMIN 27-1 MG Tabs Take 1 tablet by mouth daily.       Diet: routine diet  Activity: Advance as tolerated. Pelvic rest for 6 weeks.   Outpatient follow up:1 wk Follow up Appt: Future Appointments  Date Time Provider Department Center  12/30/2017 12:00 PM Lazaro ArmsEure, Luther H, MD [facility]  01/23/2018 12:00 PM Cresenzo-Dishmon, Scarlette CalicoFrances, CNM [facility]   Follow up Visit:No follow-ups on file.  Postpartum contraception: Not Discussed  Newborn Data: Live born female  Birth Weight: 4 lb 15.7 oz (2260 g) APGAR: 5, 7  Newborn Delivery   Birth date/time:  12/19/2017 03:49:00 Delivery type:  C-Section, Low Vertical Trial of labor:  No C-section categorization:  Primary     Baby Feeding: Breast Disposition:NICU   12/21/2017 Tilda BurrowJohn V Xzayvier Fagin, MD

## 2017-12-21 NOTE — Lactation Note (Signed)
This note was copied from a baby's chart. Lactation Consultation Note: Experienced BF mom. Reports disappointment that she is not obtaining more milk- only drops at present. Encouragement given. Asking for manual pump- states she has used that before with better results. Given- reviewed setup, use and cleaning of pump pieces. Mom pumping with manual pump and obtained some whitish milk- very pleased to see some milk. Reports breasts are feeling a little fuller this morning. Has Medela DEBP for home.Encouraged to continue q 3 hours 8 times/day. No questions at present. To call prn  Patient Name: Meagan [name] ZOXWR'UToday's Date: 12/21/2017 Reason for consult: Follow-up assessment;Preterm <34wks   Maternal Data Has patient been taught Hand Expression?: Yes Does the patient have breastfeeding experience prior to this delivery?: Yes  Feeding Feeding Type: Donor Breast Milk  LATCH Score       Type of Nipple: Everted at rest and after stimulation  Comfort (Breast/Nipple): Soft / non-tender        Interventions    Lactation Tools Discussed/Used Pump Review: Setup, frequency, and cleaning   Consult Status Consult Status: PRN    Meagan [name], Meagan [name] 12/21/2017, 10:23 AM

## 2017-12-21 NOTE — Plan of Care (Signed)
Patient is alert and oriented x4. She denies any pain or discomfort this morning. The patient has ambulated to the NICU this morning, using the wheelchair as support when ambulating.

## 2017-12-21 NOTE — Progress Notes (Signed)
CSW received notification from [place], [place] regarding patient having questions about obtaining proper baby necessities including car seat and diapers. CSW met with patient briefly in room while preparing for discharge to discuss needs. Patient stated that she was told there was a program available to her since her baby was born early and in NICU. CSW explained to patient that [place] would be able to assist her and that CSW would notify them of family need. CSW explained to patient where CSW office is located and that a weekday CSW would follow her and her baby throughout NICU stay. Patient expressed gratitude for CSW interaction.  Madilyn Fireman, MSW, [place] Social Worker [facility] [phone]

## 2017-12-21 NOTE — Progress Notes (Signed)
All discharge teaching completed with the patient. All printed discharge instructions given and explained to the patient. Patient verbalizes an understanding of all instructions given and denies any questions or concerns.

## 2017-12-23 ENCOUNTER — Encounter: Payer: Self-pay | Admitting: Obstetrics & Gynecology

## 2017-12-23 ENCOUNTER — Ambulatory Visit (INDEPENDENT_AMBULATORY_CARE_PROVIDER_SITE_OTHER): Payer: Medicaid Other | Admitting: Obstetrics & Gynecology

## 2017-12-23 VITALS — BP 124/75 | HR 85 | Wt 182.0 lb

## 2017-12-23 DIAGNOSIS — Z9889 Other specified postprocedural states: Secondary | ICD-10-CM

## 2017-12-23 DIAGNOSIS — Z98891 History of uterine scar from previous surgery: Secondary | ICD-10-CM

## 2017-12-23 MED ORDER — HYDROCHLOROTHIAZIDE 25 MG PO TABS: 25.0000 mg | ORAL_TABLET | Freq: Every day | ORAL | 0 refills | Status: DC

## 2017-12-23 NOTE — Progress Notes (Signed)
  HPI: Patient returns for routine postoperative follow-up having undergone Primary emergent LTCS on 12/19/2017.  The patient's immediate postoperative recovery has been unremarkable. Since hospital discharge the patient reports swelling otherwise doing well.   Current Outpatient Medications: acetaminophen (TYLENOL) 500 MG tablet, Take 1,000 mg by mouth every 6 (six) hours as needed for moderate pain., Disp: , Rfl:  albuterol (PROVENTIL HFA;VENTOLIN HFA) 108 (90 Base) MCG/ACT inhaler, Inhale 1-2 puffs into the lungs every 6 (six) hours as needed for wheezing or shortness of breath., Disp: , Rfl:  ALPRAZolam (XANAX) 0.5 MG tablet, Take 1 tablet (0.5 mg total) by mouth 3 (three) times daily as needed. for anxiety, Disp: 90 tablet, Rfl: 3 ibuprofen (ADVIL,MOTRIN) 600 MG tablet, Take 1 tablet (600 mg total) by mouth every 6 (six) hours., Disp: 12 tablet, Rfl: 0 nortriptyline (PAMELOR) 25 MG capsule, Take 1 capsule (25 mg total) by mouth at bedtime., Disp: 90 capsule, Rfl: 4 oxyCODONE (OXY IR/ROXICODONE) 5 MG immediate release tablet, Take 1 tablet (5 mg total) by mouth every 6 (six) hours as needed for severe pain or breakthrough pain., Disp: 12 tablet, Rfl: 0 Prenatal Vit-Fe Fumarate-FA (PNV PRENATAL PLUS MULTIVITAMIN) 27-1 MG TABS, Take 1 tablet by mouth daily., Disp: 30 tablet, Rfl: 11  No current facility-administered medications for this visit.     Blood pressure 124/75, pulse 85, weight 182 lb (82.6 kg), last menstrual period 04/26/2017, unknown if currently breastfeeding.  Physical Exam: Incision clean dry intact Abdomen soft some bruising  Diagnostic Tests:   Pathology: benign  Impression: S/p primary C section for abruption  Plan: Meds ordered this encounter  Medications  . hydrochlorothiazide (HYDRODIURIL) 25 MG tablet    Sig: Take 1 tablet (25 mg total) by mouth daily.    Dispense:  10 tablet    Refill:  0     Follow up: 4  weeks  Lazaro ArmsLuther H Eure, MD

## 2017-12-24 ENCOUNTER — Encounter: Payer: Self-pay | Admitting: Obstetrics & Gynecology

## 2017-12-26 ENCOUNTER — Encounter: Payer: Self-pay | Admitting: Obstetrics & Gynecology

## 2017-12-26 NOTE — Progress Notes (Signed)
Postpartum Day 1: Cesarean Delivery at 4414w6d for placental abruption with hemorrhage, breech presentation  Subjective: Patient reports incisional pain, tolerating PO and no problems voiding.  No flatus yet. Baby doing well in NICU  Objective: BP 113/64 (BP Location: Right Arm)   Pulse 84   Temp 98 58F (36.8 C) (Oral)   Resp 18  Ht 5\' 2"  (1.575 m)   Wt 196 lb (88.9 kg)   LMP 04/26/2017 (Approximate)   SpO2 99%   Breastfeeding? Unknown   BMI 35.85 kg/m   Physical Exam:  General: alert and no distress Lochia: appropriate Uterine Fundus: firm Incision: healing well, no significant drainage, no significant erythema DVT Evaluation: No evidence of DVT seen on physical exam. Negative Homan's sign. No significant calf/ankle edema.  CBC Latest Ref Rng & Units 12/20/2017 12/18/2017 12/17/2017  WBC 4.0 - 10.5 K/uL 13.1(H) 11.5(H) 12.4(H)  Hemoglobin 12.0 - 15.0 g/dL 1.6(X8.7(L) 10.2(L) 11.9(L)  Hematocrit 36.0 - 46.0 % 26.3(L) 30.4(L) 35.2(L)  Platelets 150 - 400 K/uL 149(L) 176 193   Assessment/Plan: Status post Cesarean section. Doing well postoperatively.  Ferrous sulfate for anemia Breast and bottlefeeding, undecided for contraception Continue current care.  Jaynie CollinsUGONNA  Gelene Recktenwald, MD, FACOG Obstetrician & Gynecologist, [facility] for [employer], [facility]

## 2017-12-27 ENCOUNTER — Ambulatory Visit: Payer: Medicaid Other | Admitting: Adult Health

## 2017-12-30 ENCOUNTER — Encounter: Payer: Medicaid Other | Admitting: Obstetrics & Gynecology

## 2018-01-01 ENCOUNTER — Telehealth: Payer: Self-pay | Admitting: Advanced Practice Midwife

## 2018-01-01 NOTE — Telephone Encounter (Signed)
Pt called stating that she feels like she may be getting a uti. She states that she still feels like she has to void after she urinates. She c/o mild burning with urination. Advised pt that per Drenda FreezeFran, she could bring a urine sample by the office for us to dip. Advised that if the dip did not show nitrates, we would have to send off for a culture before treating. Pt  Verbalized understanding.

## 2018-01-01 NOTE — Telephone Encounter (Signed)
Patient called stating that she had emergency C-section last week and now she is having a UTI or yeast infection. Pt would like a call back from a nurse. Please contact pt

## 2018-01-16 ENCOUNTER — Telehealth: Payer: Self-pay | Admitting: *Deleted

## 2018-01-16 NOTE — Telephone Encounter (Signed)
Patient states she is only pumping about 1 ounce at a time and needs help with increasing her milk supply. Encouraged patient to pump whenever the baby eats, even through the night, do skin to skin and to increase water production. Stated she would try.

## 2018-01-21 ENCOUNTER — Telehealth (HOSPITAL_COMMUNITY): Payer: Self-pay | Admitting: Lactation Services

## 2018-01-21 NOTE — Telephone Encounter (Signed)
Mom called with concerns about milk supply.  Baby was delivered 12/19/17 at 33.6 weeks.  Baby was discharged from NICU x 1 week ago.  Mom has not been able to pump on a regular basis.  She is only able to obtain .5 mls.  Mom has just started putting baby to breast and states baby will latch on.  Instructed to continue to latch baby and also begin pumping every 3 hours.  Mom has a Medela pump in style.  Mom will begin plan and call if further concerns. [place] Progression Recent Vital Signs   @VS @   Past Medical History:  Diagnosis Date  . Abdominal cramps 12/06/2015  . Anxiety   . Asthma    inhaler at bedside  . Daily headache   . Diarrhea 06/04/2013  . Hemiplegic migraine    blurred vision, nausea, dizziness, numbness  . Kidney stones   . Mental disorder    huffed inhalants "affected brain"  . Pyelonephritis complicating pregnancy, antepartum, first trimester 11/05/2011  . Stomach ulcer      Expected Discharge Date  @FLOW (161096::0)@(205839::1)@  Diet Order    None       VTE Documentation  @FLOW (4540981::1)@(7060410::1)@   Work Intensity Score/Level of Care  @FLOW (10536::1)@  @LEVELOFCARE @   Mobility  @FLOW (7060220::1)@     Significant Events    DC Barriers   Abnormal Labs:  Meagan [name] S 01/21/2018, 3:20 PM ml

## 2018-01-23 ENCOUNTER — Ambulatory Visit: Payer: Medicaid Other | Admitting: Advanced Practice Midwife

## 2018-01-27 ENCOUNTER — Telehealth: Payer: Self-pay | Admitting: Obstetrics & Gynecology

## 2018-01-27 NOTE — Telephone Encounter (Signed)
Patient called stating that she would like for Dr. Despina HiddenEure to call her in a refill of her medication. Pt would like it sent to [facility]. Please contact pt when done.

## 2018-01-28 ENCOUNTER — Other Ambulatory Visit: Payer: Self-pay | Admitting: Obstetrics & Gynecology

## 2018-01-28 NOTE — Telephone Encounter (Signed)
Pt notified refills have been sent in.

## 2018-01-30 ENCOUNTER — Ambulatory Visit: Payer: Medicaid Other | Admitting: Advanced Practice Midwife

## 2018-02-03 ENCOUNTER — Telehealth: Payer: Self-pay | Admitting: Women's Health

## 2018-02-03 NOTE — Telephone Encounter (Signed)
Pt called stating that she is having lower abdominal pain, burning with urination, and feeling like she hasnt emptied her bladder after urinating. Advised pt that she could come by the office and leave us a urine sample and we could check for a uti. Pt verbalized understanding.

## 2018-02-03 NOTE — Telephone Encounter (Signed)
Patient called stating that she would like for a provider to call her in something for her infection. Pt would like something sent to [facility]. Please contact pt

## 2018-02-05 ENCOUNTER — Other Ambulatory Visit: Payer: Medicaid Other

## 2018-02-05 ENCOUNTER — Ambulatory Visit: Payer: Medicaid Other | Admitting: Women's Health

## 2018-02-05 DIAGNOSIS — R300 Dysuria: Secondary | ICD-10-CM

## 2018-02-05 NOTE — Progress Notes (Signed)
Pt came in and left urine sample to send off for urine culture. Pt having burning with urination. Urine sent to lab for culture. JSY

## 2018-02-06 ENCOUNTER — Ambulatory Visit: Payer: Medicaid Other | Admitting: Women's Health

## 2018-02-06 ENCOUNTER — Telehealth: Payer: Self-pay | Admitting: Advanced Practice Midwife

## 2018-02-06 ENCOUNTER — Encounter: Payer: Self-pay | Admitting: Women's Health

## 2018-02-06 NOTE — Telephone Encounter (Signed)
LMOVM that urine culture results were not back as we just sent them off yesterday. Advised that it could take a few days to get the results back.

## 2018-02-06 NOTE — Telephone Encounter (Signed)
Patient called stating that she came by yesterday and left a sample of urine because she think that she has an infection. Pt would like to know the results of her urine. Please contact pt

## 2018-02-07 LAB — URINE CULTURE

## 2018-02-10 ENCOUNTER — Telehealth: Payer: Self-pay | Admitting: Adult Health

## 2018-02-10 ENCOUNTER — Telehealth: Payer: Self-pay | Admitting: Obstetrics & Gynecology

## 2018-02-10 ENCOUNTER — Other Ambulatory Visit: Payer: Self-pay | Admitting: Adult Health

## 2018-02-10 MED ORDER — NITROFURANTOIN MONOHYD MACRO 100 MG PO CAPS: 100.0000 mg | ORAL_CAPSULE | Freq: Two times a day (BID) | ORAL | 0 refills | Status: DC

## 2018-02-10 NOTE — Telephone Encounter (Signed)
Left message that Rx sent in for UTI

## 2018-02-10 NOTE — Telephone Encounter (Signed)
Meds ordered this encounter  Medications   nitrofurantoin, macrocrystal-monohydrate, (MACROBID) 100 MG capsule    Sig: Take 1 capsule (100 mg total) by mouth 2 (two) times daily.    Dispense:  14 capsule    Refill:  0    

## 2018-02-11 ENCOUNTER — Encounter: Payer: Self-pay | Admitting: Advanced Practice Midwife

## 2018-02-11 ENCOUNTER — Ambulatory Visit: Payer: Medicaid Other | Admitting: Advanced Practice Midwife

## 2018-02-19 ENCOUNTER — Other Ambulatory Visit: Payer: Self-pay | Admitting: Obstetrics & Gynecology

## 2018-02-21 ENCOUNTER — Other Ambulatory Visit: Payer: Self-pay

## 2018-02-25 ENCOUNTER — Encounter: Payer: Self-pay | Admitting: *Deleted

## 2018-02-25 ENCOUNTER — Ambulatory Visit: Payer: Medicaid Other | Admitting: Advanced Practice Midwife

## 2018-03-10 ENCOUNTER — Ambulatory Visit (INDEPENDENT_AMBULATORY_CARE_PROVIDER_SITE_OTHER): Payer: Medicaid Other | Admitting: Women's Health

## 2018-03-10 ENCOUNTER — Other Ambulatory Visit: Payer: Self-pay

## 2018-03-10 ENCOUNTER — Encounter: Payer: Self-pay | Admitting: Women's Health

## 2018-03-10 VITALS — BP 113/76 | HR 95 | Ht 62.0 in | Wt 164.0 lb

## 2018-03-10 DIAGNOSIS — Z9889 Other specified postprocedural states: Secondary | ICD-10-CM

## 2018-03-10 DIAGNOSIS — O09299 Supervision of pregnancy with other poor reproductive or obstetric history, unspecified trimester: Secondary | ICD-10-CM

## 2018-03-10 DIAGNOSIS — Z98891 History of uterine scar from previous surgery: Secondary | ICD-10-CM

## 2018-03-10 DIAGNOSIS — Z3202 Encounter for pregnancy test, result negative: Secondary | ICD-10-CM

## 2018-03-10 DIAGNOSIS — L7682 Other postprocedural complications of skin and subcutaneous tissue: Secondary | ICD-10-CM

## 2018-03-10 DIAGNOSIS — Z30011 Encounter for initial prescription of contraceptive pills: Secondary | ICD-10-CM

## 2018-03-10 LAB — POCT URINE PREGNANCY: Preg Test, Ur: NEGATIVE

## 2018-03-10 MED ORDER — NORETHINDRONE 0.35 MG PO TABS: 1.0000 | ORAL_TABLET | Freq: Every day | ORAL | 11 refills | Status: DC

## 2018-03-10 NOTE — Patient Instructions (Signed)
Tips To Increase Milk Supply  Lots of water! Enough so that your urine is clear  Plenty of calories, if you're not getting enough calories, your milk supply can decrease  Breastfeed/pump often, every 2-3 hours x 20-85mins  Fenugreek 3 pills 3 times a day, this may make your urine smell like maple syrup  Mother's Milk Tea  Lactation cookies, google for the recipe  Real oatmeal  For incisional pain: Lidocaine patches on for 12 hours, off for 12 hours

## 2018-03-10 NOTE — Progress Notes (Signed)
   GYN VISIT Patient name: Meagan [name] MRN [id number]  Date of birth: 1991-03-18 Chief Complaint:   Incisional Pain  History of Present Illness:   Meagan [name] is a 27 y.o. [phone] Caucasian female 2.53mths s/p PLTCS being seen today for report of right sided incision pain x 4 days, only hurts w/ coughing/sneezing, etc, if she pushes in on that side it feels better. Never came for pp visit. Was breastfeeding, stopped about ago d/t decreased supply. Would like to try again if possible. Currently doesn't have custody of kids.  Not on birth control, needs progestin only d/t hemiplegic migraines w/ aura- discussed all options, wants POPs.      No LMP recorded. The current method of family planning is none. Last pap 11/15/15. Results were:  normal Review of Systems:   Pertinent items are noted in HPI Denies fever/chills, dizziness, headaches, visual disturbances, fatigue, shortness of breath, chest pain, abdominal pain, vomiting, abnormal vaginal discharge/itching/odor/irritation, problems with periods, bowel movements, urination, or intercourse unless otherwise stated above.  Pertinent History Reviewed:  Reviewed past medical,surgical, social, obstetrical and family history.  Reviewed problem list, medications and allergies. Physical Assessment:   Vitals:   03/10/18 1137  BP: 113/76  Pulse: 95  Weight: 164 lb (74.4 kg)  Height: 5\' 2"  (1.575 m)  Body mass index is 30 kg/m.       Physical Examination:   General appearance: alert, well appearing, and in no distress  Mental status: alert, oriented to person, place, and time  Skin: warm & dry   Cardiovascular: normal heart rate noted  Respiratory: normal respiratory effort, no distress  Abdomen: soft, c/s incision well-healed, pain to deep palpation Rt side incision, worse when she raises her head off bed in crunch position, co-exam w/ LHE who feels it is nerve pain  Pelvic: examination not indicated  Extremities: no  edema   Results for orders placed or performed in visit on 03/10/18 (from the past 24 hour(s))  POCT urine pregnancy   Collection Time: 03/10/18 11:41 AM  Result Value Ref Range   Preg Test, Ur Negative Negative    Assessment & Plan:  1) 2.69mths s/p PLTCS  2) Rt side incisional pain> per LHE nerve pain, recommends OTC lidocaine patches on x 12hr, off x 12hrs  3) Contraception management> Rx micronor w/ 11RF, understands has to take at exact same time daily to be effective, if late taking use condom as back-up   4) Decreased milk supply> gave printed info  Meds:  Meds ordered this encounter  Medications  . norethindrone (MICRONOR,CAMILA,ERRIN) 0.35 MG tablet    Sig: Take 1 tablet (0.35 mg total) by mouth daily.    Dispense:  1 Package    Refill:  11    Order Specific Question:   Supervising Provider    Answer:   Duane Lope H [[id number]]    Orders Placed This Encounter  Procedures  . POCT urine pregnancy    Return for May for pap & physical.  [name] CNM, [facility] 03/10/2018 12:14 PM

## 2018-03-17 ENCOUNTER — Telehealth: Payer: Self-pay | Admitting: Obstetrics & Gynecology

## 2018-03-17 NOTE — Telephone Encounter (Signed)
I stated before I was giving her that last script but we will not continue to manage it going forward

## 2018-03-17 NOTE — Telephone Encounter (Signed)
Patient called stating that she would like to know if Dr. Despina HiddenEure is going to keep refilling her Xanax, normally Dr. Despina HiddenEure places refill on her bottle but this time he didn't. Pt states that she is not able to stop this medication cold [place]. Please contact pt

## 2018-03-17 NOTE — Telephone Encounter (Signed)
Patient called stating she did not have any refills on the last prescription of Xanax.  She is asking if it was the last refill or if any future refills will be sent in. Please advise.

## 2018-03-18 ENCOUNTER — Encounter: Payer: Self-pay | Admitting: *Deleted

## 2018-03-24 ENCOUNTER — Telehealth: Payer: Self-pay | Admitting: Obstetrics & Gynecology

## 2018-03-24 NOTE — Telephone Encounter (Signed)
Pt called requesting refill on xanax . She [facility] but offered to prescribe klonopin. She is concerned that stopping the xanax will cause her to have seizures. Advised pt that Dr Despina HiddenEure stated he would not send in any more refills. Advised that she should call her PCP and verbalize her concerns about stopping the medication to them. Pt verbalized understanding.

## 2018-03-24 NOTE — Telephone Encounter (Signed)
Patient called stating that she would like a call back from Dr. Despina HiddenEure directly. She would like Dr. Despina HiddenEure to refer her to a doctor that can prescribe her xanax or what should she do. Pt states that she has been taking xanax for 8 years and can't stop because it could cause her to have seizures.  Please contact pt

## 2018-04-10 ENCOUNTER — Other Ambulatory Visit: Payer: Self-pay | Admitting: Advanced Practice Midwife

## 2018-04-10 MED ORDER — FLUCONAZOLE 150 MG PO TABS
ORAL_TABLET | ORAL | 2 refills | Status: DC
Start: 2018-04-10 — End: 2018-11-13

## 2018-04-10 NOTE — Progress Notes (Signed)
Diflucan for presumed yeast

## 2018-04-10 NOTE — Progress Notes (Signed)
Diflucan for presumed yeast.

## 2018-09-24 IMAGING — CT CT ANKLE*L* W/O CM
3 series · 12 of 35 positions shown, 14 images · non-contrast
Comparison: Radiograph 10/29/2016

CLINICAL DATA: Fall with ankle fracture

EXAM:
CT OF THE LEFT ANKLE WITHOUT CONTRAST
TECHNIQUE: Multidetector CT imaging of the left ankle was performed according
to the standard protocol. Multiplanar CT image reconstructions were
also generated.

[Series 4: lower ext 1.5 st · axial · 0.32mm/px · z∈[-26,+132]mm · 4 of 153 slices shown, 5 images]
[im 24/153  soft-tissue]
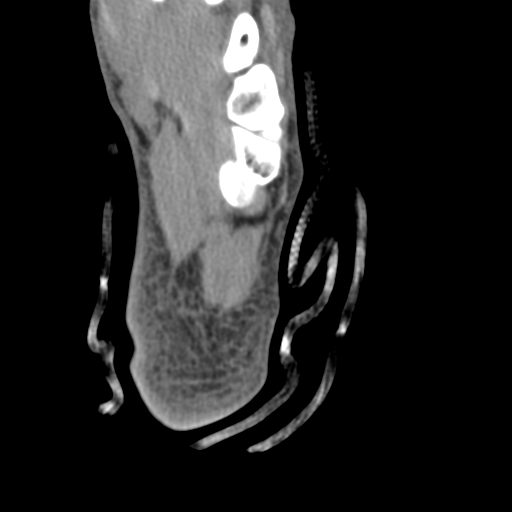
[im 24/153  bone]
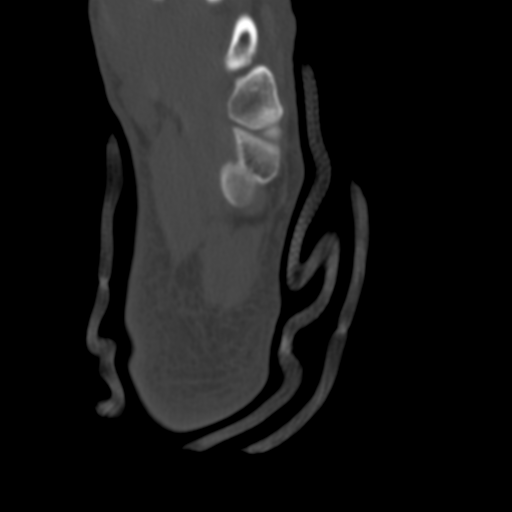
[im 59/153  bone]
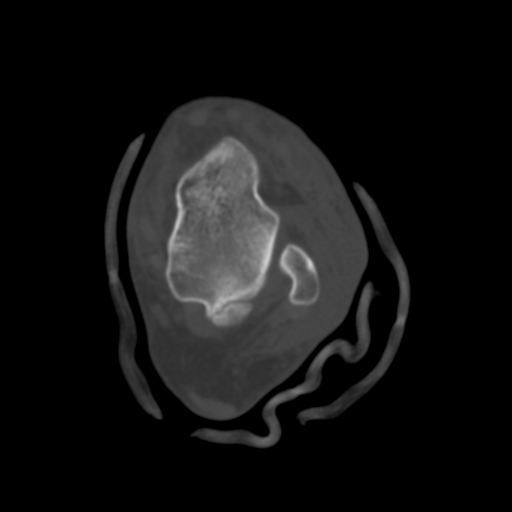
[im 94/153  bone]
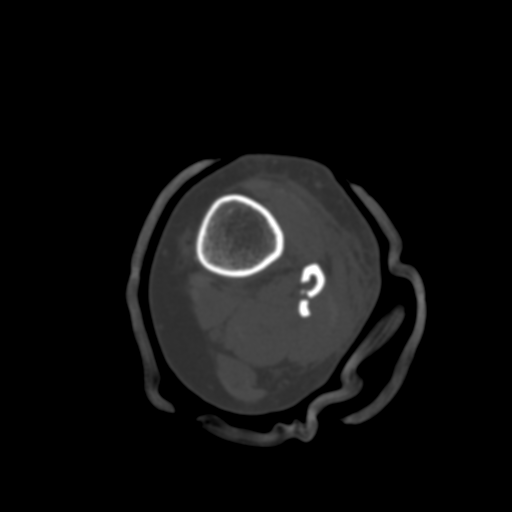
[im 129/153  bone]
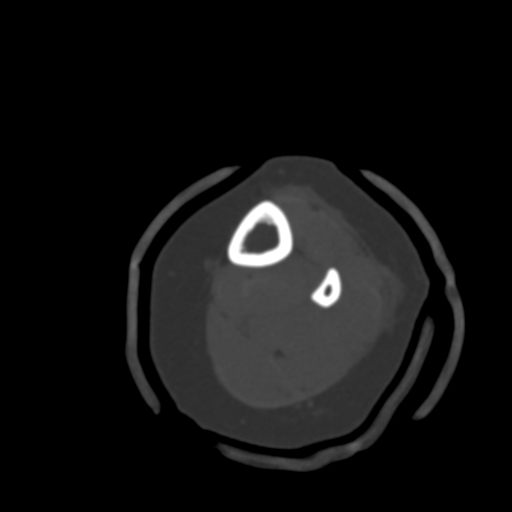

[Series 9: lower ext cor st · coronal · 0.29mm/px · 3 of 85 slices shown]
[im 17/85  bone]
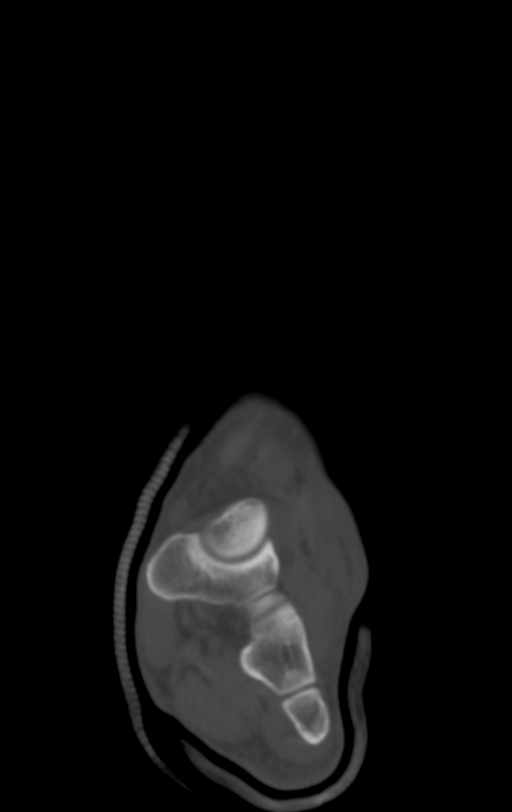
[im 34/85  bone]
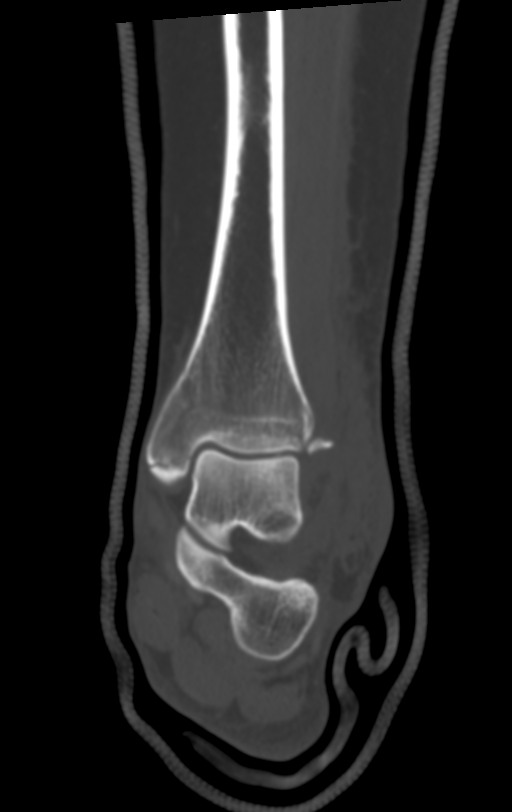
[im 51/85  bone]
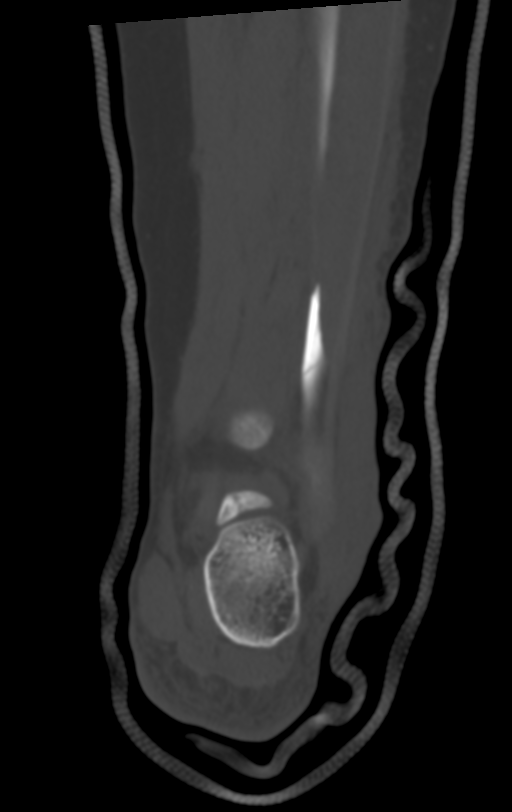

[Series 10: lower ext sag st · sagittal · 0.32mm/px · 5 of 103 slices shown, 6 images]
[im 35/103  bone]
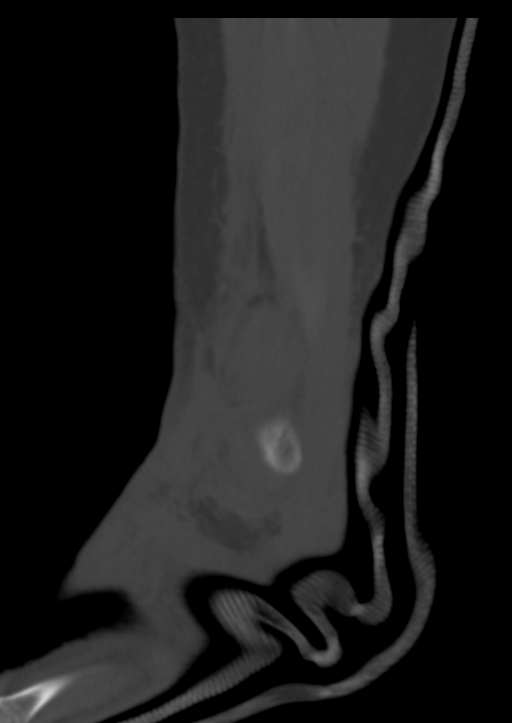
[im 43/103  bone]
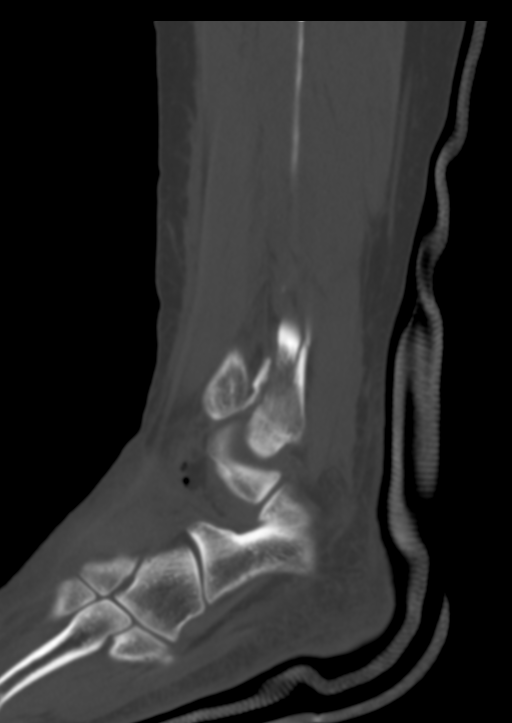
[im 52/103  soft-tissue]
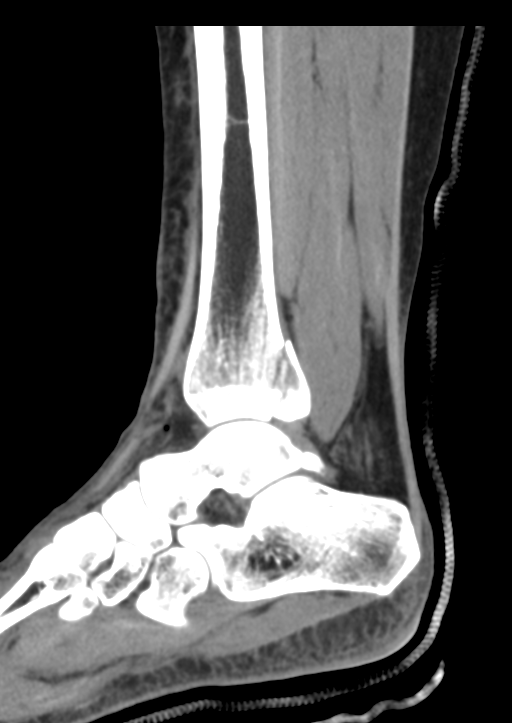
[im 52/103  bone]
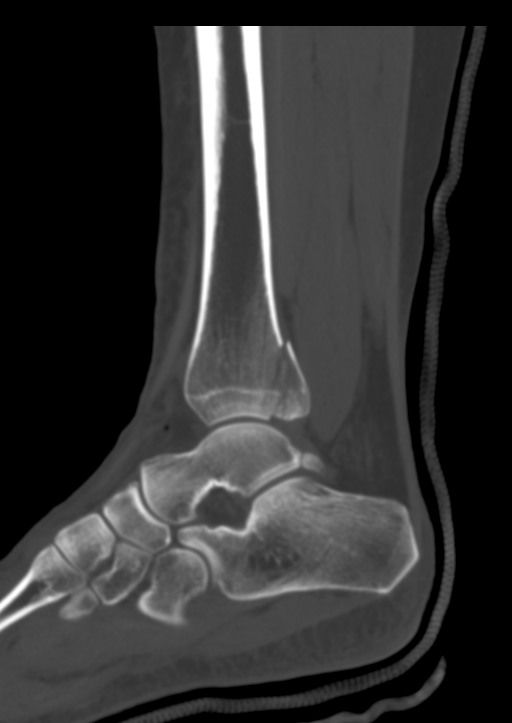
[im 60/103  bone]
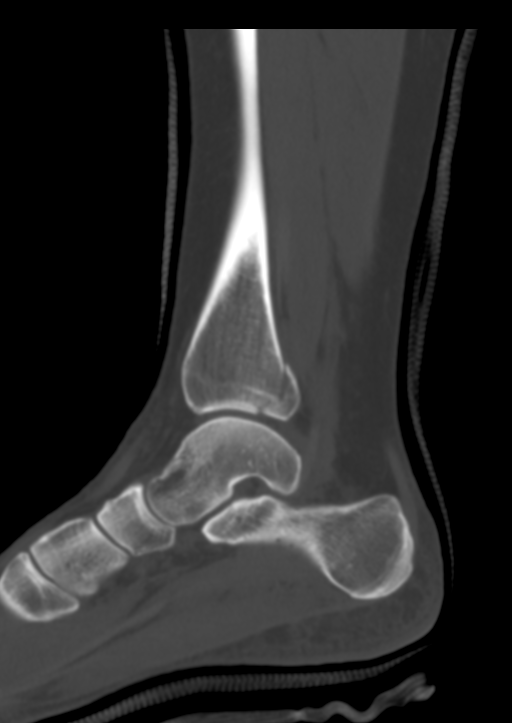
[im 69/103  bone]
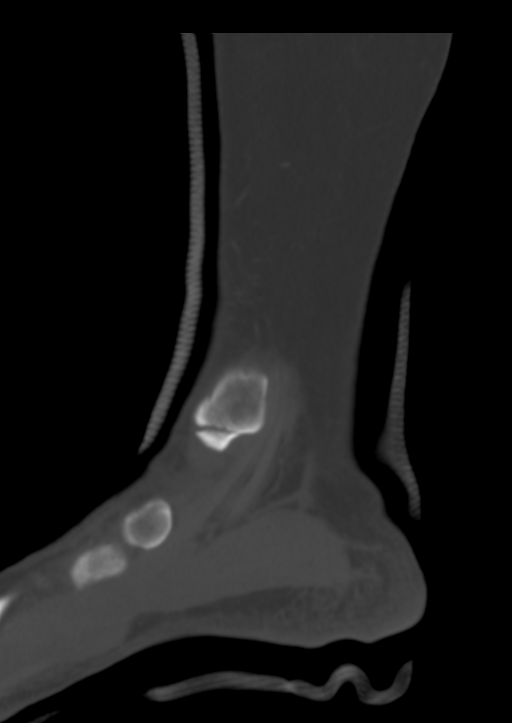

[12 of 35 positions shown; findings below may reference images not displayed]

FINDINGS: Bones/Joint/Cartilage

Minimally displaced horizontal fracture through the medial malleolus
of the distal tibia. Mildly comminuted fracture of the medial
metaphysis of the tibia with mildly comminuted fracture of the
posterior malleolus of the distal tibia. Mildly comminuted fracture
involving the distal shaft and metaphysis of the fibula, without
significant angulation, anteriorly this extends to the superolateral
joint margin. Punctate calcific densities superior to the lateral
talar dome may reflect small intra-articular fracture fragments.

Ligaments

Suboptimally assessed by CT.

Muscles and Tendons

Normal muscle bulk. Anterior, medial and lateral tendons are grossly
normal in position. Achilles tendon appears intact.

Soft tissues

Extensive soft tissue swelling over the lateral ankle and foot.
IMPRESSION: Minimally displaced medial malleolar fracture with additional mildly
comminuted fracture involving the medial metaphysis of tibia with
extension of comminuted fracture to the posterior malleolus of the
distal tibia. Additional mildly comminuted fracture involving the
distal shaft and metaphysis of the fibula with articular extension
anteriorly. At least 2 punctate calcifications superior to the
lateral talar dome, may reflect small intra-articular fracture
fragments.

## 2018-10-06 ENCOUNTER — Other Ambulatory Visit: Payer: Self-pay | Admitting: Neurology

## 2018-10-31 ENCOUNTER — Telehealth: Payer: Self-pay | Admitting: Neurology

## 2018-10-31 MED ORDER — UBROGEPANT 50 MG PO TABS: 50.0000 mg | ORAL_TABLET | Freq: Two times a day (BID) | ORAL | 0 refills | Status: DC | PRN

## 2018-10-31 MED ORDER — ONDANSETRON 4 MG PO TBDP: 4.0000 mg | ORAL_TABLET | Freq: Three times a day (TID) | ORAL | 0 refills | Status: DC | PRN

## 2018-10-31 NOTE — Telephone Encounter (Signed)
Patient called via after hours call service d/t hemiplegic migraine, had vision disturbance and tingling, now nausea/vom and headache, had been stable on Pamelor. Reports seeing Dr. Lucia Gaskins about 6 months ago, but last appt. A year ago per chart.  I suggested she take zofran odt for N/V, will call in. Also may try Bernita Raisin, but since she is has had onset of migraine attack some time ago today, Bernita Raisin would be helpful if she were to have another acute migraine. She demonstrated understanding. common SE of Ubrelvy discussed with pt.  She was advised, that we are doing VV and she should make an appt. With Dr. Lucia Gaskins. Nurse may reach out to her. She was agreeable.   She had no issues with comprehension on the phone. Stated EMS came out and checked her. She had no dysarthria over phone, sounded soft in her voice volume.

## 2018-11-03 ENCOUNTER — Encounter: Payer: Self-pay | Admitting: *Deleted

## 2018-11-03 NOTE — Telephone Encounter (Signed)
Called pt to offer a telemedicine visit with Dr. Lucia Gaskins. Pt's vm was full. Will try again later and message through mychart.

## 2018-11-13 ENCOUNTER — Other Ambulatory Visit: Payer: Self-pay

## 2018-11-13 ENCOUNTER — Ambulatory Visit (INDEPENDENT_AMBULATORY_CARE_PROVIDER_SITE_OTHER): Payer: Medicaid Other | Admitting: Adult Health

## 2018-11-13 ENCOUNTER — Encounter: Payer: Self-pay | Admitting: Adult Health

## 2018-11-13 ENCOUNTER — Telehealth: Payer: Self-pay | Admitting: Neurology

## 2018-11-13 VITALS — Ht 62.0 in

## 2018-11-13 DIAGNOSIS — Z98891 History of uterine scar from previous surgery: Secondary | ICD-10-CM | POA: Insufficient documentation

## 2018-11-13 DIAGNOSIS — O3680X0 Pregnancy with inconclusive fetal viability, not applicable or unspecified: Secondary | ICD-10-CM | POA: Diagnosis not present

## 2018-11-13 DIAGNOSIS — O09891 Supervision of other high risk pregnancies, first trimester: Secondary | ICD-10-CM

## 2018-11-13 DIAGNOSIS — Z8759 Personal history of other complications of pregnancy, childbirth and the puerperium: Secondary | ICD-10-CM | POA: Insufficient documentation

## 2018-11-13 DIAGNOSIS — Z3201 Encounter for pregnancy test, result positive: Secondary | ICD-10-CM | POA: Insufficient documentation

## 2018-11-13 DIAGNOSIS — Z3A01 Less than 8 weeks gestation of pregnancy: Secondary | ICD-10-CM | POA: Diagnosis not present

## 2018-11-13 MED ORDER — PROMETHAZINE HCL 25 MG PO TABS: 25.0000 mg | ORAL_TABLET | Freq: Four times a day (QID) | ORAL | 1 refills | Status: DC | PRN

## 2018-11-13 MED ORDER — PRENATAL PLUS 27-1 MG PO TABS: 1.0000 | ORAL_TABLET | Freq: Every day | ORAL | 12 refills | Status: DC
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
## 2018-11-13 NOTE — Telephone Encounter (Signed)
Cover my meds is calling for a PA on  Ubrogepant (UBRELVY) 50 MG TABS  The PA is under [place] . But if Lucia Gaskins is the doctor another PA will need to be submitted  with Ahern's Name.  [phone] - [place] refer. Key [id number] .

## 2018-11-17 NOTE — Telephone Encounter (Signed)
I called [facility] [employer] and spoke with [place]. Completed Ubrelvy 50 mg PA over the phone. meds tried: sumatriptan, relpax, cambia, ibuprofen, Topiramate, Fioricet. PA# [id number]. 24 hour turnaround time.

## 2018-12-04 ENCOUNTER — Other Ambulatory Visit: Payer: Self-pay

## 2018-12-04 ENCOUNTER — Ambulatory Visit (INDEPENDENT_AMBULATORY_CARE_PROVIDER_SITE_OTHER): Payer: Medicaid Other

## 2018-12-04 ENCOUNTER — Other Ambulatory Visit: Payer: Self-pay | Admitting: Adult Health

## 2018-12-04 DIAGNOSIS — O3680X0 Pregnancy with inconclusive fetal viability, not applicable or unspecified: Secondary | ICD-10-CM | POA: Diagnosis not present

## 2018-12-04 DIAGNOSIS — Z3A08 8 weeks gestation of pregnancy: Secondary | ICD-10-CM

## 2018-12-04 NOTE — Progress Notes (Signed)
[name] TA/TV 7+5 wks,single IUP with YS,positive fht 144 bpm,small subchorionic hemorrhage 2.5 x 1.6 x .4 cm,crl 9.72 mm,normal ovaries bilat  Chaperone: Marylu Lund

## 2018-12-09 ENCOUNTER — Ambulatory Visit (INDEPENDENT_AMBULATORY_CARE_PROVIDER_SITE_OTHER): Payer: Medicaid Other | Admitting: Obstetrics & Gynecology

## 2018-12-09 ENCOUNTER — Other Ambulatory Visit: Payer: Self-pay

## 2018-12-09 ENCOUNTER — Encounter: Payer: Self-pay | Admitting: Obstetrics & Gynecology

## 2018-12-09 VITALS — BP 124/76 | HR 101 | Ht 63.0 in | Wt 158.0 lb

## 2018-12-09 DIAGNOSIS — Z98891 History of uterine scar from previous surgery: Secondary | ICD-10-CM

## 2018-12-09 DIAGNOSIS — Z3A08 8 weeks gestation of pregnancy: Secondary | ICD-10-CM

## 2018-12-09 DIAGNOSIS — O0991 Supervision of high risk pregnancy, unspecified, first trimester: Secondary | ICD-10-CM

## 2018-12-09 DIAGNOSIS — Z8759 Personal history of other complications of pregnancy, childbirth and the puerperium: Secondary | ICD-10-CM

## 2018-12-09 MED ORDER — ALPRAZOLAM 0.5 MG PO TABS: 0.5000 mg | ORAL_TABLET | Freq: Three times a day (TID) | ORAL | 2 refills | Status: DC | PRN

## 2018-12-09 NOTE — Progress Notes (Signed)
   HIGH-RISK PREGNANCY VISIT Patient name: Meagan [name] MRN [id number]  Date of birth: [birth year] Chief Complaint:   discuss meds (discuss Xanax)  History of Present Illness:   Meagan [name] is a 28 y.o. [phone] female at [redacted]w[redacted]d with an Estimated Date of Delivery: 07/18/19 being seen today for ongoing management of a high-risk pregnancy complicated by chronic benzodiazepine therapy.  Today she reports no complaints.  .  .   . denies leaking of fluid.  Review of Systems:   Pertinent items are noted in HPI Denies abnormal vaginal discharge w/ itching/odor/irritation, headaches, visual changes, shortness of breath, chest pain, abdominal pain, severe nausea/vomiting, or problems with urination or bowel movements unless otherwise stated above. Pertinent History Reviewed:  Reviewed past medical,surgical, social, obstetrical and family history.  Reviewed problem list, medications and allergies. Physical Assessment:   Vitals:   12/09/18 1546  BP: 124/76  Pulse: (!) 101  Weight: 158 lb (71.7 kg)  Height: 5\' 3"  (1.6 m)  Body mass index is 27.99 kg/m.           Physical Examination:   General appearance: alert, well appearing, and in no distress  Mental status: alert, oriented to person, place, and time  Skin: warm & dry   Extremities:      Cardiovascular: normal heart rate noted  Respiratory: normal respiratory effort, no distress  Abdomen: gravid, soft, non-tender  Pelvic: Cervical exam deferred         Fetal Status:          Fetal Surveillance Testing today:    No results found for this or any previous visit (from the past 24 hour(s)).  Assessment & Plan:  1) High-risk pregnancy [id number] at [redacted]w[redacted]d with an Estimated Date of Delivery: 07/18/19   2) Chronic benzodiaxepene use, stable, xanax 0.5 mg TID    Meds:  Meds ordered this encounter  Medications  . ALPRAZolam (XANAX) 0.5 MG tablet    Sig: Take 1 tablet (0.5 mg total) by mouth 3 (three) times daily as needed. for  anxiety    Dispense:  90 tablet    Refill:  2    Labs/procedures today:   Treatment Plan:  Pt is on chronic xanax therapy and has been for years.  If does not take has seizures she states  Reviewed:  labor symptoms and general obstetric precautions including but not limited to vaginal bleeding, contractions, leaking of fluid and fetal movement were reviewed in detail with the patient.  All questions were answered.  Follow-up: Return in about 4 weeks (around 01/06/2019) for NT sonogram , HROB.  Orders Placed This Encounter  Procedures  . US Fetal Nuchal Translucency Measurement   Florian Buff  12/09/2018 4:20 PM

## 2018-12-12 ENCOUNTER — Telehealth: Payer: Self-pay | Admitting: Neurology

## 2018-12-12 ENCOUNTER — Other Ambulatory Visit: Payer: Self-pay | Admitting: Neurology

## 2018-12-12 NOTE — Telephone Encounter (Signed)
She called the answering service that she was out of nortriptyline.     She has not seen you in > one year and has been told she needs a virtual or live visit.  Additionally, she is [redacted] weeks pregnant.    I let her know I would forward a message and that she would likely need a live or virtual visit since I was uncertain you would want her to continue nortriptyline.

## 2018-12-13 ENCOUNTER — Telehealth: Payer: Self-pay | Admitting: Neurology

## 2018-12-13 NOTE — Telephone Encounter (Signed)
She called answering service again.    She ran out of nortriptyline (2 days ago) and doesn't feel right.  She is [redacted] weeks pregnant.   She was last seen > 1 year ago.  She did not make a follow up appt after speaking to Dr. Rexene Alberts after hours in May after advised to do so  I advised her I would forward a message to Dr. Jaynee Eagles and Romelle Starcher so that a follow up could be made to discuss therapy options

## 2018-12-15 ENCOUNTER — Encounter: Payer: Self-pay | Admitting: Neurology

## 2018-12-15 ENCOUNTER — Other Ambulatory Visit: Payer: Self-pay | Admitting: Neurology

## 2018-12-15 MED ORDER — NORTRIPTYLINE HCL 25 MG PO CAPS
25.0000 mg | ORAL_CAPSULE | Freq: Every day | ORAL | 0 refills | Status: DC
Start: 2018-12-15 — End: 2019-01-29

## 2018-12-15 NOTE — Addendum Note (Signed)
Addended by: Gildardo Griffes on: 12/15/2018 02:26 PM   Modules accepted: Orders

## 2018-12-15 NOTE — Telephone Encounter (Signed)
Spoke with pt. Obtained consent for a video visit on mychart with [name] NP tomorrow 6/16 @ 3:00 pm.  Pt understands that although there may be some limitations with this type of visit, we will take all precautions to reduce any security or privacy concerns.  Pt understands that this will be treated like an in office visit and we will file with pt's insurance, and there may be a patient responsible charge related to this service. She confirmed name & DOB and updated chart. Pt is currently about [redacted] weeks pregnant. Pt is taking prenatal vitamin, xanax prn, omeprazole daily, albuterol prn, tylenol prn right now. She stated she knew her login but not the password to [employer]. Pt was given the help desk to call and will notify our office if she is not able to get logged in by tomorrow. Pt understands to check-in on mychart for the appt at about 2:45 pm tomorrow. Pt verbalized appreciation for the call.

## 2018-12-15 NOTE — Progress Notes (Signed)
The patient called. She ran out of her nortriptyline 4 days ago and is having problems with feeling nervous and jittery. A Rx was called in.

## 2018-12-15 NOTE — Telephone Encounter (Signed)
Meagan [name], she needs an appointment can you schedule with me or [name]? Doxy is fine. Prob [name] if she can get in this week thanks

## 2018-12-16 ENCOUNTER — Telehealth: Payer: Self-pay | Admitting: Family Medicine

## 2018-12-22 NOTE — Telephone Encounter (Signed)
Per [place] tracks, Ubrelvy approved from 11/17/2018 - 11/12/2019. Confirmation # [id number] P. Prior Approval #: [id number]

## 2018-12-26 ENCOUNTER — Telehealth: Payer: Self-pay | Admitting: *Deleted

## 2018-12-26 ENCOUNTER — Inpatient Hospital Stay (HOSPITAL_COMMUNITY)
Admission: AD | Admit: 2018-12-26 | Discharge: 2018-12-26 | Disposition: A | Discharge status: Home / Self Care | Payer: Medicaid Other | Attending: Obstetrics and Gynecology | Admitting: Obstetrics and Gynecology

## 2018-12-26 ENCOUNTER — Other Ambulatory Visit: Payer: Self-pay

## 2018-12-26 ENCOUNTER — Encounter (HOSPITAL_COMMUNITY): Payer: Self-pay

## 2018-12-26 DIAGNOSIS — R1030 Lower abdominal pain, unspecified: Secondary | ICD-10-CM | POA: Diagnosis not present

## 2018-12-26 DIAGNOSIS — R82998 Other abnormal findings in urine: Secondary | ICD-10-CM

## 2018-12-26 DIAGNOSIS — Z880 Allergy status to penicillin: Secondary | ICD-10-CM | POA: Diagnosis not present

## 2018-12-26 DIAGNOSIS — R109 Unspecified abdominal pain: Secondary | ICD-10-CM | POA: Diagnosis not present

## 2018-12-26 DIAGNOSIS — Z3A10 10 weeks gestation of pregnancy: Secondary | ICD-10-CM

## 2018-12-26 DIAGNOSIS — F17210 Nicotine dependence, cigarettes, uncomplicated: Secondary | ICD-10-CM | POA: Diagnosis not present

## 2018-12-26 DIAGNOSIS — O99331 Smoking (tobacco) complicating pregnancy, first trimester: Secondary | ICD-10-CM | POA: Insufficient documentation

## 2018-12-26 DIAGNOSIS — O26891 Other specified pregnancy related conditions, first trimester: Secondary | ICD-10-CM | POA: Diagnosis not present

## 2018-12-26 DIAGNOSIS — O209 Hemorrhage in early pregnancy, unspecified: Secondary | ICD-10-CM | POA: Insufficient documentation

## 2018-12-26 LAB — WET PREP, GENITAL
Sperm: NONE SEEN
Trich, Wet Prep: NONE SEEN
Yeast Wet Prep HPF POC: NONE SEEN

## 2018-12-26 LAB — URINALYSIS, ROUTINE W REFLEX MICROSCOPIC
Bilirubin Urine: NEGATIVE
Glucose, UA: NEGATIVE mg/dL
Ketones, ur: 5 mg/dL — AB
Nitrite: NEGATIVE
Protein, ur: NEGATIVE mg/dL
Specific Gravity, Urine: 1.026 (ref 1.005–1.030)
pH: 5 (ref 5.0–8.0)

## 2018-12-26 LAB — GC/CHLAMYDIA PROBE AMP (~~LOC~~) NOT AT ARMC
Chlamydia: NEGATIVE
Neisseria Gonorrhea: NEGATIVE

## 2018-12-26 MED ORDER — NITROFURANTOIN MONOHYD MACRO 100 MG PO CAPS: 100.0000 mg | ORAL_CAPSULE | Freq: Two times a day (BID) | ORAL | 1 refills | Status: DC

## 2018-12-26 NOTE — Telephone Encounter (Signed)
[facility] to [facility] this am was soaked in blood ems took her to [facility] and they released her today and they did hear the baby heartbeat but she is still concerned something is wrong, she is still bleeding now and would feel better if Tish gave her a call please

## 2018-12-26 NOTE — Discharge Instructions (Signed)
Abdominal Pain During Pregnancy  Abdominal pain is common during pregnancy, and has many possible causes. Some causes are more serious than others, and sometimes the cause is not known. Abdominal pain can be a sign that labor is starting. It can also be caused by normal growth and stretching of muscles and ligaments during pregnancy. Always tell your health care provider if you have any abdominal pain. Follow these instructions at home:  Do not have sex or put anything in your vagina until your pain goes away completely.  Get plenty of rest until your pain improves.  Drink enough fluid to keep your urine pale yellow.  Take over-the-counter and prescription medicines only as told by your health care provider.  Keep all follow-up visits as told by your health care provider. This is important. Contact a health care provider if:  Your pain continues or gets worse after resting.  You have lower abdominal pain that: ? Comes and goes at regular intervals. ? Spreads to your back. ? Is similar to menstrual cramps.  You have pain or burning when you urinate. Get help right away if:  You have a fever or chills.  You have vaginal bleeding.  You are leaking fluid from your vagina.  You are passing tissue from your vagina.  You have vomiting or diarrhea that lasts for more than 24 hours.  Your baby is moving less than usual.  You feel very weak or faint.  You have shortness of breath.  You develop severe pain in your upper abdomen. Summary  Abdominal pain is common during pregnancy, and has many possible causes.  If you experience abdominal pain during pregnancy, tell your health care provider right away.  Follow your health care provider's home care instructions and keep all follow-up visits as directed. This information is not intended to replace advice given to you by your health care provider. Make sure you discuss any questions you have with your health care  provider. Document Released: 06/18/2005 Document Revised: 09/20/2016 Document Reviewed: 09/20/2016 Elsevier Interactive Patient Education  2019 Elsevier Inc.  Vaginal Bleeding During Pregnancy, First Trimester  A small amount of bleeding from the vagina (spotting) is relatively common during early pregnancy. It usually stops on its own. Various things may cause bleeding or spotting during early pregnancy. Some bleeding may be related to the pregnancy, and some may not. In many cases, the bleeding is normal and is not a problem. However, bleeding can also be a sign of something serious. Be sure to tell your health care provider about any vaginal bleeding right away. Some possible causes of vaginal bleeding during the first trimester include:  Infection or inflammation of the cervix.  Growths (polyps) on the cervix.  Miscarriage or threatened miscarriage.  Pregnancy tissue developing outside of the uterus (ectopic pregnancy).  A mass of tissue developing in the uterus due to an egg being fertilized incorrectly (molar pregnancy). Follow these instructions at home: Activity  Follow instructions from your health care provider about limiting your activity. Ask what activities are safe for you.  If needed, make plans for someone to help with your regular activities.  Do not have sex or orgasms until your health care provider says that this is safe. General instructions  Take over-the-counter and prescription medicines only as told by your health care provider.  Pay attention to any changes in your symptoms.  Do not use tampons or douche.  Write down how many pads you use each day, how often you change pads,  and how soaked (saturated) they are.  If you pass any tissue from your vagina, save the tissue so you can show it to your health care provider.  Keep all follow-up visits as told by your health care provider. This is important. Contact a health care provider if:  You have  vaginal bleeding during any part of your pregnancy.  You have cramps or labor pains.  You have a fever. Get help right away if:  You have severe cramps in your back or abdomen.  You pass large clots or a large amount of tissue from your vagina.  Your bleeding increases.  You feel light-headed or weak, or you faint.  You have chills.  You are leaking fluid or have a gush of fluid from your vagina. Summary  A small amount of bleeding (spotting) from the vagina is relatively common during early pregnancy.  Various things may cause bleeding or spotting in early pregnancy.  Be sure to tell your health care provider about any vaginal bleeding right away. This information is not intended to replace advice given to you by your health care provider. Make sure you discuss any questions you have with your health care provider. Document Released: 03/28/2005 Document Revised: 09/20/2016 Document Reviewed: 09/20/2016 Elsevier Interactive Patient Education  2019 [employer]

## 2018-12-26 NOTE — MAU Note (Signed)
Pt states she was asleep and woke up at 0400 and went to the bathroom and there was blood on the tissue. States it was like a period. Used the bathroom and had some light pink spotting. Pt reports she had some lower abdominal cramping yesterday that went away, but none now. Reports some left flank pain that she states is like pressure and achy. Rates 6/10 when it hurts. Has a history of pyelo x2. Also reports that her dr saw a subchorionic hemorrhage.

## 2018-12-26 NOTE — MAU Provider Note (Signed)
Chief Complaint: No chief complaint on file.   First Provider Initiated Contact with Patient 12/26/18 [phone]        SUBJECTIVE HPI: Meagan [name] is a 28 y.o. [id number] at 4236w6d by LMP who presents to maternity admissions reporting vaginal bleeding and left flank pain and pressure. Has a history of pyelonephritis. . She denies vaginal itching/burning, urinary symptoms, h/a, dizziness, n/v, or fever/chills.    RN Note: Pt states she was asleep and woke up at 0400 and went to the bathroom and there was blood on the tissue. States it was like a period. Used the bathroom and had some light pink spotting. Pt reports she had some lower abdominal cramping yesterday that went away, but none now. Reports some left flank pain that she states is like pressure and achy. Rates 6/10 when it hurts. Has a history of pyelo x2. Also reports that her dr saw a subchorionic hemorrhage.    Past Medical History:  Diagnosis Date  . Abdominal cramps 12/06/2015  . Anxiety   . Asthma    inhaler at bedside  . Daily headache   . Diarrhea 06/04/2013  . Hemiplegic migraine    blurred vision, nausea, dizziness, numbness  . Kidney stones   . Mental disorder    huffed inhalants "affected brain"  . Pyelonephritis complicating pregnancy, antepartum, first trimester 11/05/2011  . Stomach ulcer    Past Surgical History:  Procedure Laterality Date  . ANKLE SURGERY Left 09/2016   plates & screws  . CESAREAN SECTION N/A 12/19/2017   Procedure: CESAREAN SECTION;  Surgeon: Lazaro ArmsEure, Luther H, MD;  [facility];  Service: Obstetrics;  Laterality: N/A;   Social History   Socioeconomic History  . Marital status: Married    Spouse name: Not on file  . Number of children: 3  . Years of education: Not on file  . Highest education level: GED or equivalent  Occupational History  . Not on file  Social Needs  . Financial resource strain: Not on file  . Food insecurity    Worry: Not on file    Inability: Not  on file  . Transportation needs    Medical: Not on file    Non-medical: Not on file  Tobacco Use  . Smoking status: Current Every Day Smoker    Packs/day: 1.00    Years: 7.00    Pack years: 7.00    Types: Cigarettes  . Smokeless tobacco: Never Used  Substance and Sexual Activity  . Alcohol use: No    Alcohol/week: 0.0 standard drinks    Frequency: Never    Comment: occ; not now  . Drug use: No    Types: Other-see comments    Comment: huffed inhalants in past, "affected Brain"  . Sexual activity: Yes    Birth control/protection: None  Lifestyle  . Physical activity    Days per week: Not on file    Minutes per session: Not on file  . Stress: Not on file  Relationships  . Social Musicianconnections    Talks on phone: Not on file    Gets together: Not on file    Attends religious service: Not on file    Active member of club or organization: Not on file    Attends meetings of clubs or organizations: Not on file    Relationship status: Not on file  . Intimate partner violence    Fear of current or ex partner: Not on file    Emotionally abused: Not  on file    Physically abused: Not on file    Forced sexual activity: Not on file  Other Topics Concern  . Not on file  Social History Narrative   Lives at home with her children   Right handed.   Six sodas per day.   Currently about [redacted] weeks pregnant as of 12/15/2018   No current facility-administered medications on file prior to encounter.    Current Outpatient Medications on File Prior to Encounter  Medication Sig Dispense Refill  . acetaminophen (TYLENOL) 500 MG tablet Take 1,000 mg by mouth every 6 (six) hours as needed for moderate pain.    Marland Kitchen. albuterol (PROVENTIL HFA;VENTOLIN HFA) 108 (90 Base) MCG/ACT inhaler Inhale 1-2 puffs into the lungs every 6 (six) hours as needed for wheezing or shortness of breath.    . ALPRAZolam (XANAX) 0.5 MG tablet Take 1 tablet (0.5 mg total) by mouth 3 (three) times daily as needed. for anxiety 90  tablet 2  . nortriptyline (PAMELOR) 25 MG capsule Take 1 capsule (25 mg total) by mouth at bedtime. APPOINTMENT NEEDED FOR FURTHER REFILLS CALL [phone] 30 capsule 0  . omeprazole (PRILOSEC) 40 MG capsule Take 40 mg by mouth daily.    . prenatal vitamin w/FE, FA (PRENATAL 1 + 1) 27-1 MG TABS tablet Take 1 tablet by mouth daily at 12 noon. 30 each 12  . promethazine (PHENERGAN) 25 MG tablet Take 1 tablet (25 mg total) by mouth every 6 (six) hours as needed for nausea or vomiting. (Patient not taking: Reported on 12/09/2018) 30 tablet 1  . Ubrogepant (UBRELVY) 50 MG TABS Take 50 mg by mouth 2 (two) times daily as needed (may take at onset of migraine, may repeat once after 2 hours.). (Patient not taking: Reported on 11/13/2018) 10 tablet 0   Allergies  Allergen Reactions  . Codeine Nausea And Vomiting and Other (See Comments)    Vertigo  . Penicillins Rash and Other (See Comments)    Unknown:  Childhood reaction.  Has patient had a PCN reaction causing immediate rash, facial/tongue/throat swelling, SOB or lightheadedness with hypotension: Unknown Has patient had a PCN reaction causing severe rash involving mucus membranes or skin necrosis: Unknown Has patient had a PCN reaction that required hospitalization No Has patient had a PCN reaction occurring within the last 10 years: No If all of the above answers are "NO", then may pro  . Sulfamethoxazole-Trimethoprim Hives    I have reviewed patient's Past Medical Hx, Surgical Hx, Family Hx, Social Hx, medications and allergies.   ROS:  Review of Systems  Constitutional: Negative for chills and fever.  Respiratory: Negative for shortness of breath.   Gastrointestinal: Negative for abdominal pain, constipation, diarrhea and nausea.  Genitourinary: Positive for flank pain and vaginal bleeding.  Musculoskeletal: Negative for back pain.   Review of Systems  Other systems negative   Physical Exam  Physical Exam Patient Vitals for the past 24  hrs:  BP Temp Temp src Pulse Resp SpO2  12/26/18 0513 (!) 112/59 98.2 F (36.8 C) Oral 86 20 96 %   Constitutional: Well-developed, well-nourished female in no acute distress.  Cardiovascular: normal rate Respiratory: normal effort GI: Abd soft, non-tender. Pos BS x 4 MS: Extremities nontender, no edema, normal ROM Neurologic: Alert and oriented x 4.  GU: Neg CVAT on right, mild CVAT on left.   PELVIC EXAM: Cervix pink, visually closed, without lesion, scant red discharge, vaginal walls and external genitalia normal  FHR 177bpm  LAB  RESULTS Results for orders placed or performed during the hospital encounter of 12/26/18 (from the past 24 hour(s))  Urinalysis, Routine w reflex microscopic     Status: Abnormal   Collection Time: 12/26/18  5:34 AM  Result Value Ref Range   Color, Urine YELLOW YELLOW   APPearance HAZY (A) CLEAR   Specific Gravity, Urine 1.026 1.005 - 1.030   pH 5.0 5.0 - 8.0   Glucose, UA NEGATIVE NEGATIVE mg/dL   Hgb urine dipstick LARGE (A) NEGATIVE   Bilirubin Urine NEGATIVE NEGATIVE   Ketones, ur 5 (A) NEGATIVE mg/dL   Protein, ur NEGATIVE NEGATIVE mg/dL   Nitrite NEGATIVE NEGATIVE   Leukocytes,Ua TRACE (A) NEGATIVE   RBC / HPF 0-5 0 - 5 RBC/hpf   WBC, UA 11-20 0 - 5 WBC/hpf   Bacteria, UA FEW (A) NONE SEEN   Squamous Epithelial / LPF 11-20 0 - 5   Mucus PRESENT    Amorphous Crystal PRESENT    Ca Oxalate Crys, UA PRESENT   Wet prep, genital     Status: Abnormal   Collection Time: 12/26/18  5:54 AM  Result Value Ref Range   Yeast Wet Prep HPF POC NONE SEEN NONE SEEN   Trich, Wet Prep NONE SEEN NONE SEEN   Clue Cells Wet Prep HPF POC PRESENT (A) NONE SEEN   WBC, Wet Prep HPF POC MANY (A) NONE SEEN   Sperm NONE SEEN      IMAGING No results found. Has had a first trimester [name] in office  MAU Management/MDM: Ordered UA which showed possible evidence of UTI.  Sent to culture Reassured patient that bleeding is likely from the small subchorionic  hemorrhage seen on [name] Discussed small [facility] is usually not associated with miscarriage Will treat presumptively for UTI, doubt serious pyelo at this time.   ASSESSMENT Single IUP at [redacted]w[redacted]d Flank pain Bleeding in first trimester Known small subchorionic hemorrhage   PLAN Discharge home Rx Macrobid for presumed UTI Followup in office Bleeding precautions  Pt stable at time of discharge. Encouraged to return here or to other Urgent Care/ED if she develops worsening of symptoms, increase in pain, fever, or other concerning symptoms.    Hansel Feinstein CNM, MSN Certified Nurse-Midwife 12/26/2018  5:23 AM

## 2018-12-26 NOTE — Telephone Encounter (Signed)
Patient stated she went to the ED for some bleeding last night.  She is currently only having very light pink spotting. Informed patient that the [facility] may cause bleeding off/on until it resolves.  Advised if bleeding became heavier soaking pads, to call us or [facility]  Pt verbalized understanding.

## 2018-12-27 LAB — CULTURE, OB URINE: Culture: NO GROWTH

## 2018-12-29 ENCOUNTER — Telehealth: Payer: Self-pay | Admitting: *Deleted

## 2018-12-29 NOTE — Telephone Encounter (Signed)
Spoke to patient who stated she went to ED for bleeding and is still concerned. Discussed with patient that she has a [facility] in which can cause light to heavy bleeding and usually resolves on it's own.  Advised to monitor and if bleeding became heavy like a period, to call us or go to [facility].  Pt verbalized understanding.

## 2019-01-05 ENCOUNTER — Telehealth: Payer: Self-pay | Admitting: Women's Health

## 2019-01-05 NOTE — Telephone Encounter (Signed)

## 2019-01-06 ENCOUNTER — Other Ambulatory Visit: Payer: Self-pay

## 2019-01-06 ENCOUNTER — Ambulatory Visit (INDEPENDENT_AMBULATORY_CARE_PROVIDER_SITE_OTHER): Payer: Medicaid Other | Admitting: Women's Health

## 2019-01-06 ENCOUNTER — Encounter: Payer: Self-pay | Admitting: Women's Health

## 2019-01-06 ENCOUNTER — Telehealth: Payer: Self-pay | Admitting: Family Medicine

## 2019-01-06 ENCOUNTER — Ambulatory Visit (INDEPENDENT_AMBULATORY_CARE_PROVIDER_SITE_OTHER): Payer: Medicaid Other

## 2019-01-06 ENCOUNTER — Ambulatory Visit: Payer: Medicaid Other | Admitting: *Deleted

## 2019-01-06 VITALS — BP 119/77 | HR 82 | Wt 158.0 lb

## 2019-01-06 DIAGNOSIS — O09299 Supervision of pregnancy with other poor reproductive or obstetric history, unspecified trimester: Secondary | ICD-10-CM

## 2019-01-06 DIAGNOSIS — O09291 Supervision of pregnancy with other poor reproductive or obstetric history, first trimester: Secondary | ICD-10-CM | POA: Diagnosis not present

## 2019-01-06 DIAGNOSIS — Z1379 Encounter for other screening for genetic and chromosomal anomalies: Secondary | ICD-10-CM

## 2019-01-06 DIAGNOSIS — Z1389 Encounter for screening for other disorder: Secondary | ICD-10-CM

## 2019-01-06 DIAGNOSIS — Z3481 Encounter for supervision of other normal pregnancy, first trimester: Secondary | ICD-10-CM

## 2019-01-06 DIAGNOSIS — Z331 Pregnant state, incidental: Secondary | ICD-10-CM

## 2019-01-06 DIAGNOSIS — Z1371 Encounter for nonprocreative screening for genetic disease carrier status: Secondary | ICD-10-CM

## 2019-01-06 DIAGNOSIS — Z98891 History of uterine scar from previous surgery: Secondary | ICD-10-CM

## 2019-01-06 DIAGNOSIS — Z8759 Personal history of other complications of pregnancy, childbirth and the puerperium: Secondary | ICD-10-CM | POA: Insufficient documentation

## 2019-01-06 DIAGNOSIS — Z3682 Encounter for antenatal screening for nuchal translucency: Secondary | ICD-10-CM | POA: Diagnosis not present

## 2019-01-06 DIAGNOSIS — Z3A12 12 weeks gestation of pregnancy: Secondary | ICD-10-CM

## 2019-01-06 DIAGNOSIS — Z3490 Encounter for supervision of normal pregnancy, unspecified, unspecified trimester: Secondary | ICD-10-CM | POA: Insufficient documentation

## 2019-01-06 DIAGNOSIS — Z363 Encounter for antenatal screening for malformations: Secondary | ICD-10-CM

## 2019-01-06 DIAGNOSIS — O360110 Maternal care for anti-D [Rh] antibodies, first trimester, not applicable or unspecified: Secondary | ICD-10-CM

## 2019-01-06 DIAGNOSIS — O0991 Supervision of high risk pregnancy, unspecified, first trimester: Secondary | ICD-10-CM

## 2019-01-06 LAB — POCT URINALYSIS DIPSTICK OB
Blood, UA: NEGATIVE
Glucose, UA: NEGATIVE
Ketones, UA: NEGATIVE
Leukocytes, UA: NEGATIVE
Nitrite, UA: NEGATIVE
POC,PROTEIN,UA: NEGATIVE

## 2019-01-06 MED ORDER — RHO D IMMUNE GLOBULIN 1500 UNIT/2ML IJ SOSY
300.0000 ug | PREFILLED_SYRINGE | Freq: Once | INTRAMUSCULAR | Status: AC
  Administered 2019-01-06: 300 ug via INTRAMUSCULAR

## 2019-01-06 MED ORDER — BLOOD PRESSURE MONITOR MISC: 0 refills | Status: DC

## 2019-01-06 NOTE — Progress Notes (Signed)
INITIAL OBSTETRICAL VISIT Patient name: Meagan [name] MRN [id number]  Date of birth: 12/04/1990 Chief Complaint:   Initial Prenatal Visit (nt/it)  History of Present Illness:   Meagan [name] is a 28 y.o. [phone] Caucasian female at 1435w3d by LMP c/w 7wk u/s, with an Estimated Date of Delivery: 07/18/19 being seen today for her initial obstetrical visit.   Her obstetrical history is significant for term SVB x3, pre-e w/ 1st 2 pregnancies, polyhdramnios w/ last 2 pregnancies. Had 33wk c/s d/t abruption w/ persistent bleeding & breech last pregnancy, wants TOLAC.   Anxiety on long-term xanax 0.5mg  TID, takes throughout pregnancies Smoker Has known [facility], has had intermittent bleeding, hasn't received Rhogam yet Today she reports no complaints.  Patient's last menstrual period was 10/11/2018. Last pap 11/15/15. Results were: normal Review of Systems:   Pertinent items are noted in HPI Denies cramping/contractions, leakage of fluid, vaginal bleeding, abnormal vaginal discharge w/ itching/odor/irritation, headaches, visual changes, shortness of breath, chest pain, abdominal pain, severe nausea/vomiting, or problems with urination or bowel movements unless otherwise stated above.  Pertinent History Reviewed:  Reviewed past medical,surgical, social, obstetrical and family history.  Reviewed problem list, medications and allergies. OB History  Gravida Para Term Preterm AB Living  5 4 3 1  0 4  SAB TAB Ectopic Multiple Live Births  0 0 0 0 4    # Outcome Date GA Lbr Len/2nd Weight Sex Delivery Anes PTL Lv  5 Current           4 Preterm 12/19/17 9570w6d  4 lb 15.7 oz (2.26 kg) F CS-LVertical EPI  LIV     Complications: Abruptio Placenta, Polyhydramnios  3 Term 06/18/16 6857w0d 04:13 / 00:04 8 lb 3 oz (3.715 kg) F Vag-Spont EPI N LIV     Complications: Polyhydramnios  2 Term 02/13/14 [redacted]w[redacted]d 03:14 / 00:05 6 lb 12.1 oz (3.065 kg) F Vag-Spont EPI N LIV     Complications: Pre-eclampsia   1 Term 05/30/12 7463w1d 15:31 / 04:14 7 lb 14 oz (3.572 kg) M Vag-Spont EPI N LIV     Complications: Preeclampsia   Physical Assessment:   Vitals:   01/06/19 1425  BP: 119/77  Pulse: 82  Weight: 158 lb (71.7 kg)  Body mass index is 27.99 kg/m.       Physical Examination:  General appearance - well appearing, and in no distress  Mental status - alert, oriented to person, place, and time  Psych:  She has a normal mood and affect  Skin - warm and dry, normal color, no suspicious lesions noted  Chest - effort normal, all lung fields clear to auscultation bilaterally  Heart - normal rate and regular rhythm  Abdomen - soft, nontender  Extremities:  No swelling or varicosities noted  Thin prep pap is not done>wants to wait til next visit  TODAY'S NT US 12+3 wks,measurements c/w dates,NB present,NT 1 mm,normal ovaries bilat,crl 60.31 mm,fhr 167 bpm,posterior placenta gr 0,small subchorionic hemorrhage 1.8 x 1.5 x .8 mm  Results for orders placed or performed in visit on 01/06/19 (from the past 24 hour(s))  POC Urinalysis Dipstick OB   Collection Time: 01/06/19  3:13 PM  Result Value Ref Range   Color, UA     Clarity, UA     Glucose, UA Negative Negative   Bilirubin, UA     Ketones, UA neg    Spec Grav, UA     Blood, UA neg    pH, UA  POC,PROTEIN,UA Negative Negative, Trace, Small (1+), Moderate (2+), Large (3+), 4+   Urobilinogen, UA     Nitrite, UA neg    Leukocytes, UA Negative Negative   Appearance     Odor      Assessment & Plan:  1) Low-Risk Pregnancy [id number] at [redacted]w[redacted]d with an Estimated Date of Delivery: 07/18/19   2) Initial OB visit  3) H/O pre-e x 2> baseline labs today, ASA 162mg  daily  4) H/O polyhdramnios x 2> w/o GDM  5) Anxiety> on long-term xanax 0.5mg  TID  6) Small [place] w/ VB> continue pelvic rest, Rhogam today  7) Prev c/s> @ 33wks d/t abruption/persistent bleeding/breech, wants TOLAC, gave consent to take home and review  8) Smoker> advised  cessation  Meds:  Meds ordered this encounter  Medications  . Blood Pressure Monitor MISC    Sig: For regular home bp monitoring during pregnancy    Dispense:  1 each    Refill:  0    Dx: z34.9    Order Specific Question:   Supervising Provider    Answer:   Elonda Husky, LUTHER H [[id number]]  . rho (d) immune globulin (RHIG/RHOPHYLAC) injection 300 mcg    Initial labs obtained Continue prenatal vitamins Reviewed n/v relief measures and warning s/s to report Reviewed recommended weight gain based on pre-gravid BMI Encouraged well-balanced diet Genetic Screening discussed: requested nt/it, maternit21 Cystic fibrosis, SMA, Fragile X screening discussed requested Ultrasound discussed; fetal survey: requested CCNC completed>PCM not here, form faxed Does not have home bp cuff. Rx faxed to [facility]. Check bp weekly, let us know if >140/90.   Follow-up: Return in about 6 weeks (around 02/16/2019) for [place], in person, 2nd IT, YC:[id number].   Orders Placed This Encounter  Procedures  . Urine Culture  . GC/Chlamydia Probe Amp  . US OB Comp + 14 Wk  . Integrated 1  . Obstetric Panel, Including HIV  . Urinalysis, Routine w reflex microscopic  . Sickle cell screen  . Pain Management Screening Profile (10S)  . MaterniT 21 plus Core, Blood  . SMN1 COPY NUMBER ANALYSIS (SMA Carrier Screen)  . Fragile X, PCR and Southern  . Comprehensive metabolic panel  . Protein / creatinine ratio, urine  . POC Urinalysis Dipstick OB    Roma Schanz CNM, [facility] 01/06/2019 3:55 PM

## 2019-01-06 NOTE — Patient Instructions (Signed)
Meagan [name], I greatly value your feedback.  If you receive a survey following your visit with [name] today, we appreciate you taking the time to fill it out.  Thanks, Meagan [name], CNM, [facility]  [place]!!! It is now [place] [facility] ([place], [place] [address]) Entrance located off of [address] parking   Begin taking 162mg  (two 81mg  tablets) baby aspirin daily to decrease risk of preeclampsia during pregnancy     Nausea & Vomiting  Have saltine crackers or pretzels by your bed and eat a few bites before you raise your head out of bed in the morning  Eat small frequent meals throughout the day instead of large meals  Drink plenty of fluids throughout the day to stay hydrated, just don't drink a lot of fluids with your meals.  This can make your stomach fill up faster making you feel sick  Do not brush your teeth right after you eat  Products with real ginger are good for nausea, like ginger ale and ginger hard candy Make sure it says made with real ginger!  Sucking on sour candy like lemon heads is also good for nausea  If your prenatal vitamins make you nauseated, take them at night so you will sleep through the nausea  Sea Bands  If you feel like you need medicine for the nausea & vomiting please let us know  If you are unable to keep any fluids or food down please let us know   Constipation  Drink plenty of fluid, preferably water, throughout the day  Eat foods high in fiber such as fruits, vegetables, and grains  Exercise, such as walking, is a good way to keep your bowels regular  Drink warm fluids, especially warm prune juice, or decaf coffee  Eat a 1/2 cup of real oatmeal (not instant), 1/2 cup applesauce, and 1/2-1 cup warm prune juice every day  If needed, you may take Colace (docusate sodium) stool softener once or twice a day to help keep the stool soft.   If you still are having  problems with constipation, you may take Miralax once daily as needed to help keep your bowels regular.   Home Blood Pressure Monitoring for Patients   Your provider has recommended that you check your blood pressure (BP) at least once a week at home. If you do not have a blood pressure cuff at home, one will be provided for you. Contact your provider if you have not received your monitor within 1 week.   Helpful Tips for Accurate Home Blood Pressure Checks  . Don't smoke, exercise, or drink caffeine 30 minutes before checking your BP . Use the restroom before checking your BP (a full bladder can raise your pressure) . Relax in a comfortable upright chair . Feet on the ground . Left arm resting comfortably on a flat surface at the level of your heart . Legs uncrossed . Back supported . Sit quietly and don't talk . Place the cuff on your bare arm . Adjust snuggly, so that only two fingertips can fit between your skin and the top of the cuff . Check 2 readings separated by at least one minute . Keep a log of your BP readings . For a visual, please reference this diagram: http://ccnc.care/bpdiagram  Provider Name: [name]     Phone: [phone]  Zone 1: ALL CLEAR  Continue to monitor your symptoms:  .  BP reading is less than 140 (top number) or less than 90 (bottom number)  . No right upper stomach pain . No headaches or seeing spots . No feeling nauseated or throwing up . No swelling in face and hands  Zone 2: CAUTION Call your doctor's office for any of the following:  . BP reading is greater than 140 (top number) or greater than 90 (bottom number)  . Stomach pain under your ribs in the middle or right side . Headaches or seeing spots . Feeling nauseated or throwing up . Swelling in face and hands  Zone 3: EMERGENCY  Seek immediate medical care if you have any of the following:  . BP reading is greater than160 (top number) or greater than 110 (bottom number) .  Severe headaches not improving with Tylenol . Serious difficulty catching your breath . Any worsening symptoms from Zone 2    First Trimester of Pregnancy The first trimester of pregnancy is from week 1 until the end of week 12 (months 1 through 3). A week after a sperm fertilizes an egg, the egg will implant on the wall of the uterus. This embryo will begin to develop into a baby. Genes from you and your partner are forming the baby. The female genes determine whether the baby is a boy or a girl. At 6-8 weeks, the eyes and face are formed, and the heartbeat can be seen on ultrasound. At the end of 12 weeks, all the baby's organs are formed.  Now that you are pregnant, you will want to do everything you can to have a healthy baby. Two of the most important things are to get good prenatal care and to follow your health care provider's instructions. Prenatal care is all the medical care you receive before the baby's birth. This care will help prevent, find, and treat any problems during the pregnancy and childbirth. BODY CHANGES Your body goes through many changes during pregnancy. The changes vary from woman to woman.   You may gain or lose a couple of pounds at first.  You may feel sick to your stomach (nauseous) and throw up (vomit). If the vomiting is uncontrollable, call your health care provider.  You may tire easily.  You may develop headaches that can be relieved by medicines approved by your health care provider.  You may urinate more often. Painful urination may mean you have a bladder infection.  You may develop heartburn as a result of your pregnancy.  You may develop constipation because certain hormones are causing the muscles that push waste through your intestines to slow down.  You may develop hemorrhoids or swollen, bulging veins (varicose veins).  Your breasts may begin to grow larger and become tender. Your nipples may stick out more, and the tissue that surrounds them  (areola) may become darker.  Your gums may bleed and may be sensitive to brushing and flossing.  Dark spots or blotches (chloasma, mask of pregnancy) may develop on your face. This will likely fade after the baby is born.  Your menstrual periods will stop.  You may have a loss of appetite.  You may develop cravings for certain kinds of food.  You may have changes in your emotions from day to day, such as being excited to be pregnant or being concerned that something may go wrong with the pregnancy and baby.  You may have more vivid and strange dreams.  You may have changes in your hair. These can include thickening of your  hair, rapid growth, and changes in texture. Some women also have hair loss during or after pregnancy, or hair that feels dry or thin. Your hair will most likely return to normal after your baby is born. WHAT TO EXPECT AT YOUR PRENATAL VISITS During a routine prenatal visit:  You will be weighed to make sure you and the baby are growing normally.  Your blood pressure will be taken.  Your abdomen will be measured to track your baby's growth.  The fetal heartbeat will be listened to starting around week 10 or 12 of your pregnancy.  Test results from any previous visits will be discussed. Your health care provider may ask you:  How you are feeling.  If you are feeling the baby move.  If you have had any abnormal symptoms, such as leaking fluid, bleeding, severe headaches, or abdominal cramping.  If you have any questions. Other tests that may be performed during your first trimester include:  Blood tests to find your blood type and to check for the presence of any previous infections. They will also be used to check for low iron levels (anemia) and Rh antibodies. Later in the pregnancy, blood tests for diabetes will be done along with other tests if problems develop.  Urine tests to check for infections, diabetes, or protein in the urine.  An ultrasound to  confirm the proper growth and development of the baby.  An amniocentesis to check for possible genetic problems.  Fetal screens for spina bifida and Down syndrome.  You may need other tests to make sure you and the baby are doing well. HOME CARE INSTRUCTIONS  Medicines  Follow your health care provider's instructions regarding medicine use. Specific medicines may be either safe or unsafe to take during pregnancy.  Take your prenatal vitamins as directed.  If you develop constipation, try taking a stool softener if your health care provider approves. Diet  Eat regular, well-balanced meals. Choose a variety of foods, such as meat or vegetable-based protein, fish, milk and low-fat dairy products, vegetables, fruits, and whole grain breads and cereals. Your health care provider will help you determine the amount of weight gain that is right for you.  Avoid raw meat and uncooked cheese. These carry germs that can cause birth defects in the baby.  Eating four or five small meals rather than three large meals a day may help relieve nausea and vomiting. If you start to feel nauseous, eating a few soda crackers can be helpful. Drinking liquids between meals instead of during meals also seems to help nausea and vomiting.  If you develop constipation, eat more high-fiber foods, such as fresh vegetables or fruit and whole grains. Drink enough fluids to keep your urine clear or pale yellow. Activity and Exercise  Exercise only as directed by your health care provider. Exercising will help you:  Control your weight.  Stay in shape.  Be prepared for labor and delivery.  Experiencing pain or cramping in the lower abdomen or low back is a good sign that you should stop exercising. Check with your health care provider before continuing normal exercises.  Try to avoid standing for long periods of time. Move your legs often if you must stand in one place for a long time.  Avoid heavy lifting.   Wear low-heeled shoes, and practice good posture.  You may continue to have sex unless your health care provider directs you otherwise. Relief of Pain or Discomfort  Wear a good support bra for breast  tenderness.    Take warm sitz baths to soothe any pain or discomfort caused by hemorrhoids. Use hemorrhoid cream if your health care provider approves.    Rest with your legs elevated if you have leg cramps or low back pain.  If you develop varicose veins in your legs, wear support hose. Elevate your feet for 15 minutes, 3-4 times a day. Limit salt in your diet. Prenatal Care  Schedule your prenatal visits by the twelfth week of pregnancy. They are usually scheduled monthly at first, then more often in the last 2 months before delivery.  Write down your questions. Take them to your prenatal visits.  Keep all your prenatal visits as directed by your health care provider. Safety  Wear your seat belt at all times when driving.  Make a list of emergency phone numbers, including numbers for family, friends, the hospital, and police and fire departments. General Tips  Ask your health care provider for a referral to a local prenatal education class. Begin classes no later than at the beginning of month 6 of your pregnancy.  Ask for help if you have counseling or nutritional needs during pregnancy. Your health care provider can offer advice or refer you to specialists for help with various needs.  Do not use hot tubs, steam rooms, or saunas.  Do not douche or use tampons or scented sanitary pads.  Do not cross your legs for long periods of time.  Avoid cat litter boxes and soil used by cats. These carry germs that can cause birth defects in the baby and possibly loss of the fetus by miscarriage or stillbirth.  Avoid all smoking, herbs, alcohol, and medicines not prescribed by your health care provider. Chemicals in these affect the formation and growth of the baby.  Schedule a dentist  appointment. At home, brush your teeth with a soft toothbrush and be gentle when you floss. SEEK MEDICAL CARE IF:   You have dizziness.  You have mild pelvic cramps, pelvic pressure, or nagging pain in the abdominal area.  You have persistent nausea, vomiting, or diarrhea.  You have a bad smelling vaginal discharge.  You have pain with urination.  You notice increased swelling in your face, hands, legs, or ankles. SEEK IMMEDIATE MEDICAL CARE IF:   You have a fever.  You are leaking fluid from your vagina.  You have spotting or bleeding from your vagina.  You have severe abdominal cramping or pain.  You have rapid weight gain or loss.  You vomit blood or material that looks like coffee grounds.  You are exposed to [name] measles and have never had them.  You are exposed to fifth disease or chickenpox.  You develop a severe headache.  You have shortness of breath.  You have any kind of trauma, such as from a fall or a car accident. Document Released: 06/12/2001 Document Revised: 11/02/2013 Document Reviewed: 04/28/2013 [facility] Patient Information 2015 [place], [place]. This information is not intended to replace advice given to you by your health care provider. Make sure you discuss any questions you have with your health care provider.  Coronavirus (COVID-19) Are you at risk?  Are you at risk for the Coronavirus (COVID-19)?  To be considered HIGH RISK for Coronavirus (COVID-19), you have to meet the following criteria:  . Traveled to [place], [place], [place], [place] or [place]; or in the [place] to [place], [place], [place], or [place]; and have fever, cough, and shortness of breath within the last  2 weeks of travel OR . Been in close contact with a person diagnosed with COVID-19 within the last 2 weeks and have fever, cough, and shortness of breath . IF YOU DO NOT MEET THESE CRITERIA, YOU ARE CONSIDERED LOW RISK FOR COVID-19.  What to do if you are HIGH  RISK for COVID-19?  Marland Kitchen If you are having a medical emergency, call 911. . Seek medical care right away. Before you go to a doctor's office, urgent care or emergency department, call ahead and tell them about your recent travel, contact with someone diagnosed with COVID-19, and your symptoms. You should receive instructions from your physician's office regarding next steps of care.  . When you arrive at healthcare provider, tell the healthcare staff immediately you have returned from visiting [place], [place], [place], [place] or [place]; or traveled in the [place] to [place], [place], [place], or [place]; in the last two weeks or you have been in close contact with a person diagnosed with COVID-19 in the last 2 weeks.   . Tell the health care staff about your symptoms: fever, cough and shortness of breath. . After you have been seen by a medical provider, you will be either: o Tested for (COVID-19) and discharged home on quarantine except to seek medical care if symptoms worsen, and asked to  - Stay home and avoid contact with others until you get your results (4-5 days)  - Avoid travel on public transportation if possible (such as bus, train, or airplane) or o Sent to the Emergency Department by EMS for evaluation, COVID-19 testing, and possible admission depending on your condition and test results.  What to do if you are LOW RISK for COVID-19?  Reduce your risk of any infection by using the same precautions used for avoiding the common cold or flu:  Marland Kitchen Wash your hands often with soap and warm water for at least 20 seconds.  If soap and water are not readily available, use an alcohol-based hand sanitizer with at least 60% alcohol.  . If coughing or sneezing, cover your mouth and nose by coughing or sneezing into the elbow areas of your shirt or coat, into a tissue or into your sleeve (not your hands). . Avoid shaking hands with others and consider head nods or verbal greetings only. .  Avoid touching your eyes, nose, or mouth with unwashed hands.  . Avoid close contact with people who are sick. . Avoid places or events with large numbers of people in one location, like concerts or sporting events. . Carefully consider travel plans you have or are making. . If you are planning any travel outside or inside the [name], visit the CDC's Travelers' Health webpage for the latest health notices. . If you have some symptoms but not all symptoms, continue to monitor at home and seek medical attention if your symptoms worsen. . If you are having a medical emergency, call 911.   [place] / e-Visit: [email or web address]         [facility]: [facility]: [phone]                   [facility]: [phone]

## 2019-01-06 NOTE — Progress Notes (Signed)
[name] 12+3 wks,measurements c/w dates,NB present,NT 1 mm,normal ovaries bilat,crl 60.31 mm,fhr 167 bpm,posterior placenta gr 0,small subchorionic hemorrhage 1.8 x 1.5 x .8 mm

## 2019-01-07 ENCOUNTER — Encounter: Payer: Self-pay | Admitting: Women's Health

## 2019-01-07 DIAGNOSIS — O26899 Other specified pregnancy related conditions, unspecified trimester: Secondary | ICD-10-CM | POA: Insufficient documentation

## 2019-01-07 DIAGNOSIS — Z6791 Unspecified blood type, Rh negative: Secondary | ICD-10-CM | POA: Insufficient documentation

## 2019-01-07 LAB — COMPREHENSIVE METABOLIC PANEL
ALT: 8 IU/L (ref 0–32)
AST: 6 IU/L (ref 0–40)
Albumin/Globulin Ratio: 1.9 (ref 1.2–2.2)
Albumin: 3.9 g/dL (ref 3.9–5.0)
Alkaline Phosphatase: 60 IU/L (ref 39–117)
BUN/Creatinine Ratio: 10 (ref 9–23)
BUN: 6 mg/dL (ref 6–20)
Bilirubin Total: <0.2 mg/dL (ref 0.0–1.2)
CO2: 19 mmol/L — ABNORMAL LOW (ref 20–29)
Calcium: 9 mg/dL (ref 8.7–10.2)
Chloride: 104 mmol/L (ref 96–106)
Creatinine, Ser: 0.62 mg/dL (ref 0.57–1.00)
GFR calc Af Amer: 145 mL/min/{1.73_m2} (ref >59)
GFR calc non Af Amer: 126 mL/min/{1.73_m2} (ref >59)
Globulin, Total: 2.1 g/dL (ref 1.5–4.5)
Glucose: 87 mg/dL (ref 65–99)
Potassium: 4 mmol/L (ref 3.5–5.2)
Sodium: 137 mmol/L (ref 134–144)
Total Protein: 6 g/dL (ref 6.0–8.5)

## 2019-01-07 LAB — PMP SCREEN PROFILE (10S), URINE
Amphetamine Scrn, Ur: NEGATIVE ng/mL
BARBITURATE SCREEN URINE: NEGATIVE ng/mL
BENZODIAZEPINE SCREEN, URINE: POSITIVE ng/mL — AB
CANNABINOIDS UR QL SCN: NEGATIVE ng/mL
Cocaine (Metab) Scrn, Ur: NEGATIVE ng/mL
Creatinine(Crt), U: 247.6 mg/dL (ref 20.0–300.0)
Methadone Screen, Urine: NEGATIVE ng/mL
OXYCODONE+OXYMORPHONE UR QL SCN: NEGATIVE ng/mL
Opiate Scrn, Ur: NEGATIVE ng/mL
Ph of Urine: 6.6 (ref 4.5–8.9)
Phencyclidine Qn, Ur: NEGATIVE ng/mL
Propoxyphene Scrn, Ur: NEGATIVE ng/mL

## 2019-01-07 LAB — PROTEIN / CREATININE RATIO, URINE
Creatinine, Urine: 227.9 mg/dL
Protein, Ur: 27.9 mg/dL
Protein/Creat Ratio: 122 mg/g creat (ref 0–200)

## 2019-01-08 LAB — URINE CULTURE

## 2019-01-09 LAB — URINALYSIS, ROUTINE W REFLEX MICROSCOPIC
Bilirubin, UA: NEGATIVE
Glucose, UA: NEGATIVE
Ketones, UA: NEGATIVE
Leukocytes,UA: NEGATIVE
Nitrite, UA: NEGATIVE
RBC, UA: NEGATIVE
Specific Gravity, UA: 1.028 (ref 1.005–1.030)
Urobilinogen, Ur: 1 mg/dL (ref 0.2–1.0)
pH, UA: 6.5 (ref 5.0–7.5)

## 2019-01-09 LAB — INTEGRATED 1
Crown Rump Length: 60.3 mm
Gest. Age on Collection Date: 12.3 weeks
Maternal Age at EDD: 26.2 yr
Nuchal Translucency (NT): 1 mm
Number of Fetuses: 1
PAPP-A Value: 241 ng/mL
Weight: 158 [lb_av]

## 2019-01-09 LAB — OBSTETRIC PANEL, INCLUDING HIV
Antibody Screen: NEGATIVE
Basophils Absolute: 0 10*3/uL (ref 0.0–0.2)
Basos: 0 %
EOS (ABSOLUTE): 0.1 10*3/uL (ref 0.0–0.4)
Eos: 1 %
HIV Screen 4th Generation wRfx: NONREACTIVE
Hematocrit: 38.8 % (ref 34.0–46.6)
Hemoglobin: 13.5 g/dL (ref 11.1–15.9)
Hepatitis B Surface Ag: NEGATIVE
Immature Grans (Abs): 0 10*3/uL (ref 0.0–0.1)
Immature Granulocytes: 0 %
Lymphocytes Absolute: 1.8 10*3/uL (ref 0.7–3.1)
Lymphs: 19 %
MCH: 31.3 pg (ref 26.6–33.0)
MCHC: 34.8 g/dL (ref 31.5–35.7)
MCV: 90 fL (ref 79–97)
Monocytes Absolute: 0.4 10*3/uL (ref 0.1–0.9)
Monocytes: 4 %
Neutrophils Absolute: 7.2 10*3/uL — ABNORMAL HIGH (ref 1.4–7.0)
Neutrophils: 76 %
Platelets: 225 10*3/uL (ref 150–450)
RBC: 4.32 x10E6/uL (ref 3.77–5.28)
RDW: 13.3 % (ref 11.7–15.4)
RPR Ser Ql: NONREACTIVE
Rh Factor: NEGATIVE
Rubella Antibodies, IGG: 1.41 index (ref >0.99)
WBC: 9.5 10*3/uL (ref 3.4–10.8)

## 2019-01-09 LAB — SICKLE CELL SCREEN: Sickle Cell Screen: NEGATIVE

## 2019-01-14 ENCOUNTER — Other Ambulatory Visit: Payer: Medicaid Other

## 2019-01-14 ENCOUNTER — Ambulatory Visit: Payer: Medicaid Other | Admitting: *Deleted

## 2019-01-14 ENCOUNTER — Encounter: Payer: Medicaid Other | Admitting: Advanced Practice Midwife

## 2019-01-26 ENCOUNTER — Other Ambulatory Visit: Payer: Self-pay | Admitting: Neurology

## 2019-01-26 LAB — MATERNIT 21 PLUS CORE, BLOOD
Fetal Fraction: 6
Result (T21): NEGATIVE
Trisomy 13 (Patau syndrome): NEGATIVE
Trisomy 18 (Edwards syndrome): NEGATIVE
Trisomy 21 (Down syndrome): NEGATIVE

## 2019-01-26 LAB — SMN1 COPY NUMBER ANALYSIS (SMA CARRIER SCREENING)

## 2019-01-26 LAB — FRAGILE X, PCR AND SOUTHERN

## 2019-01-28 ENCOUNTER — Other Ambulatory Visit: Payer: Self-pay | Admitting: Neurology

## 2019-01-28 NOTE — Telephone Encounter (Signed)
Pt has called to inform that she accepted a 1 yr f/u with NP [name] but she would still like to know if the nortriptyline (PAMELOR) 25 MG capsule could be called in this afternoon since she has already been about 4 days without it. Please call

## 2019-01-28 NOTE — Telephone Encounter (Signed)
I contacted the pt and left a vm asking her for a call back. Pt has an appt with [name] tomorrow and is current [redacted] weeks pregnant. Will wait until tomorrow for refill to be sent so [name] can discuss medication management during pregnancy.

## 2019-01-28 NOTE — Telephone Encounter (Signed)
Pt is needing a refill on her nortriptyline (PAMELOR) 25 MG capsule but is wanting to know if she is needing to be seen by the provider first. Please advise.

## 2019-01-29 ENCOUNTER — Ambulatory Visit: Payer: Medicaid Other | Admitting: Family Medicine

## 2019-01-29 ENCOUNTER — Other Ambulatory Visit: Payer: Self-pay

## 2019-01-29 ENCOUNTER — Encounter: Payer: Self-pay | Admitting: Family Medicine

## 2019-01-29 VITALS — BP 113/68 | HR 92 | Temp 98.0°F | Ht 63.0 in | Wt 159.0 lb

## 2019-01-29 DIAGNOSIS — G43409 Hemiplegic migraine, not intractable, without status migrainosus: Secondary | ICD-10-CM

## 2019-01-29 MED ORDER — NORTRIPTYLINE HCL 25 MG PO CAPS: 25.0000 mg | ORAL_CAPSULE | Freq: Every day | ORAL | 11 refills | Status: DC

## 2019-01-29 NOTE — Patient Instructions (Signed)
Nortriptyline is  considered low risk in pregnancy but no medication is entirely safe. There have been rare associations with Nortriptyline and limb anomalies and cardiac anomalies and other birth defects,  Pt verbalized understanding. She has been doing well on Nortriptyline.   Continue nortriptyline 25mg  daily for prevention as discussed. Tylenol as needed for abortive therapy.   Follow up in 6 months    Migraine Headache A migraine headache is a very strong throbbing pain on one side or both sides of your head. This type of headache can also cause other symptoms. It can last from 4 hours to 3 days. Talk with your doctor about what things may bring on (trigger) this condition. What are the causes? The exact cause of this condition is not known. This condition may be triggered or caused by:  Drinking alcohol.  Smoking.  Taking medicines, such as: ? Medicine used to treat chest pain (nitroglycerin). ? Birth control pills. ? Estrogen. ? Some blood pressure medicines.  Eating or drinking certain products.  Doing physical activity. Other things that may trigger a migraine headache include:  Having a menstrual period.  Pregnancy.  Hunger.  Stress.  Not getting enough sleep or getting too much sleep.  Weather changes.  Tiredness (fatigue). What increases the risk?  Being 38-27 years old.  Being female.  Having a family history of migraine headaches.  Being Caucasian.  Having depression or anxiety.  Being very overweight. What are the signs or symptoms?  A throbbing pain. This pain may: ? Happen in any area of the head, such as on one side or both sides. ? Make it hard to do daily activities. ? Get worse with physical activity. ? Get worse around bright lights or loud noises.  Other symptoms may include: ? Feeling sick to your stomach (nauseous). ? Vomiting. ? Dizziness. ? Being sensitive to bright lights, loud noises, or smells.  Before you get a  migraine headache, you may get warning signs (an aura). An aura may include: ? Seeing flashing lights or having blind spots. ? Seeing bright spots, halos, or zigzag lines. ? Having tunnel vision or blurred vision. ? Having numbness or a tingling feeling. ? Having trouble talking. ? Having weak muscles.  Some people have symptoms after a migraine headache (postdromal phase), such as: ? Tiredness. ? Trouble thinking (concentrating). How is this treated?  Taking medicines that: ? Relieve pain. ? Relieve the feeling of being sick to your stomach. ? Prevent migraine headaches.  Treatment may also include: ? Having acupuncture. ? Avoiding foods that bring on migraine headaches. ? Learning ways to control your body functions (biofeedback). ? Therapy to help you know and deal with negative thoughts (cognitive behavioral therapy). Follow these instructions at home: Medicines  Take over-the-counter and prescription medicines only as told by your doctor.  Ask your doctor if the medicine prescribed to you: ? Requires you to avoid driving or using heavy machinery. ? Can cause trouble pooping (constipation). You may need to take these steps to prevent or treat trouble pooping:  Drink enough fluid to keep your pee (urine) pale yellow.  Take over-the-counter or prescription medicines.  Eat foods that are high in fiber. These include beans, whole grains, and fresh fruits and vegetables.  Limit foods that are high in fat and sugar. These include fried or sweet foods. Lifestyle  Do not drink alcohol.  Do not use any products that contain nicotine or tobacco, such as cigarettes, e-cigarettes, and chewing tobacco. If you need  help quitting, ask your doctor.  Get at least 8 hours of sleep every night.  Limit and deal with stress. General instructions      Keep a journal to find out what may bring on your migraine headaches. For example, write down: ? What you eat and drink. ? How  much sleep you get. ? Any change in what you eat or drink. ? Any change in your medicines.  If you have a migraine headache: ? Avoid things that make your symptoms worse, such as bright lights. ? It may help to lie down in a dark, quiet room. ? Do not drive or use heavy machinery. ? Ask your doctor what activities are safe for you.  Keep all follow-up visits as told by your doctor. This is important. Contact a doctor if:  You get a migraine headache that is different or worse than others you have had.  You have more than 15 headache days in one month. Get help right away if:  Your migraine headache gets very bad.  Your migraine headache lasts longer than 72 hours.  You have a fever.  You have a stiff neck.  You have trouble seeing.  Your muscles feel weak or like you cannot control them.  You start to lose your balance a lot.  You start to have trouble walking.  You pass out (faint).  You have a seizure. Summary  A migraine headache is a very strong throbbing pain on one side or both sides of your head. These headaches can also cause other symptoms.  This condition may be treated with medicines and changes to your lifestyle.  Keep a journal to find out what may bring on your migraine headaches.  Contact a doctor if you get a migraine headache that is different or worse than others you have had.  Contact your doctor if you have more than 15 headache days in a month. This information is not intended to replace advice given to you by your health care provider. Make sure you discuss any questions you have with your health care provider. Document Released: 03/27/2008 Document Revised: 10/10/2018 Document Reviewed: 07/31/2018 [employer]vier Patient Education  2020 [employer]
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
## 2019-01-30 NOTE — Progress Notes (Signed)
Made any corrections needed, and agree with history, physical, neuro exam,assessment and plan as stated.     Antonia Ahern, MD [facility]  

## 2019-02-17 ENCOUNTER — Ambulatory Visit (INDEPENDENT_AMBULATORY_CARE_PROVIDER_SITE_OTHER): Payer: Medicaid Other

## 2019-02-17 ENCOUNTER — Other Ambulatory Visit: Payer: Self-pay

## 2019-02-17 ENCOUNTER — Encounter: Payer: Self-pay | Admitting: Obstetrics & Gynecology

## 2019-02-17 ENCOUNTER — Ambulatory Visit (INDEPENDENT_AMBULATORY_CARE_PROVIDER_SITE_OTHER): Payer: Medicaid Other | Admitting: Obstetrics & Gynecology

## 2019-02-17 VITALS — BP 105/66 | HR 98 | Wt 158.5 lb

## 2019-02-17 DIAGNOSIS — Z1379 Encounter for other screening for genetic and chromosomal anomalies: Secondary | ICD-10-CM

## 2019-02-17 DIAGNOSIS — Z3481 Encounter for supervision of other normal pregnancy, first trimester: Secondary | ICD-10-CM | POA: Diagnosis not present

## 2019-02-17 DIAGNOSIS — Z3A18 18 weeks gestation of pregnancy: Secondary | ICD-10-CM | POA: Diagnosis not present

## 2019-02-17 DIAGNOSIS — Z363 Encounter for antenatal screening for malformations: Secondary | ICD-10-CM

## 2019-02-17 DIAGNOSIS — Z1389 Encounter for screening for other disorder: Secondary | ICD-10-CM

## 2019-02-17 DIAGNOSIS — Z331 Pregnant state, incidental: Secondary | ICD-10-CM

## 2019-02-17 DIAGNOSIS — Z3482 Encounter for supervision of other normal pregnancy, second trimester: Secondary | ICD-10-CM

## 2019-02-17 LAB — POCT URINALYSIS DIPSTICK OB
Glucose, UA: NEGATIVE
Ketones, UA: NEGATIVE
Nitrite, UA: NEGATIVE
POC,PROTEIN,UA: NEGATIVE

## 2019-02-17 NOTE — Progress Notes (Signed)
[name] 79+0 wks,cephalic,posterior placenta gr 0,normal ovaries bilat, cx 3.1 cm,fhr 144 bpm,two LVEICF 1.7 and 2.2 mm,svp of fluid 4.6 cm,EFW 226 g 28%,anatomy complete

## 2019-02-17 NOTE — Progress Notes (Signed)
   LOW-RISK PREGNANCY VISIT Patient name: Meagan [name] MRN [id number]  Date of birth: 23-Mar-1991 Chief Complaint:   Routine Prenatal Visit ([name] today; 2nd IT)  History of Present Illness:   Meagan [name] is a 28 y.o. [phone] female at [redacted]w[redacted]d with an Estimated Date of Delivery: 07/18/19 being seen today for ongoing management of a low-risk pregnancy.  Today she reports no complaints. Contractions: Irregular. Vag. Bleeding: None.  Movement: Present. denies leaking of fluid. Review of Systems:   Pertinent items are noted in HPI Denies abnormal vaginal discharge w/ itching/odor/irritation, headaches, visual changes, shortness of breath, chest pain, abdominal pain, severe nausea/vomiting, or problems with urination or bowel movements unless otherwise stated above. Pertinent History Reviewed:  Reviewed past medical,surgical, social, obstetrical and family history.  Reviewed problem list, medications and allergies. Physical Assessment:   Vitals:   02/17/19 1502  BP: 105/66  Pulse: 98  Weight: 158 lb 8 oz (71.9 kg)  Body mass index is 28.08 kg/m.        Physical Examination:   General appearance: Well appearing, and in no distress  Mental status: Alert, oriented to person, place, and time  Skin: Warm & dry  Cardiovascular: Normal heart rate noted  Respiratory: Normal respiratory effort, no distress  Abdomen: Soft, gravid, nontender  Pelvic: Cervical exam deferred         Extremities: Edema: None  Fetal Status: Fetal Heart Rate (bpm): 156 Fundal Height: 20 cm Movement: Present    Results for orders placed or performed in visit on 02/17/19 (from the past 24 hour(s))  POC Urinalysis Dipstick OB   Collection Time: 02/17/19  3:04 PM  Result Value Ref Range   Color, UA     Clarity, UA     Glucose, UA Negative Negative   Bilirubin, UA     Ketones, UA neg    Spec Grav, UA     Blood, UA trace    pH, UA     POC,PROTEIN,UA Negative Negative, Trace, Small (1+), Moderate (2+),  Large (3+), 4+   Urobilinogen, UA     Nitrite, UA neg    Leukocytes, UA Moderate (2+) (A) Negative   Appearance     Odor      Assessment & Plan:  1) Low-risk pregnancy [id number] at [redacted]w[redacted]d with an Estimated Date of Delivery: 07/18/19   2) Previous C section desires vaginal delivery, ok low tansverse,    Meds: No orders of the defined types were placed in this encounter.  Labs/procedures today: sonogram  Plan:  Continue routine obstetrical care   Reviewed: Preterm labor symptoms and general obstetric precautions including but not limited to vaginal bleeding, contractions, leaking of fluid and fetal movement were reviewed in detail with the patient.  All questions were answered.  home bp cuff. Rx faxed to . Check bp weekly, let us know if >140/90.   Follow-up: Return in about 4 weeks (around 03/17/2019) for [place].  Orders Placed This Encounter  Procedures  . INTEGRATED 2  . POC Urinalysis Dipstick OB   Meagan [name]  02/17/2019 3:20 PM

## 2019-02-18 ENCOUNTER — Other Ambulatory Visit: Payer: Self-pay | Admitting: *Deleted

## 2019-02-18 DIAGNOSIS — Z3A18 18 weeks gestation of pregnancy: Secondary | ICD-10-CM

## 2019-02-18 DIAGNOSIS — Z3482 Encounter for supervision of other normal pregnancy, second trimester: Secondary | ICD-10-CM

## 2019-02-18 MED ORDER — BLOOD PRESSURE MONITOR MISC: 0 refills | Status: DC

## 2019-02-20 LAB — INTEGRATED 2
AFP MoM: 0.71
Alpha-Fetoprotein: 28.4 ng/mL
Crown Rump Length: 60.3 mm
DIA MoM: 2.24
DIA Value: 364.3 pg/mL
Estriol, Unconjugated: 2.27 ng/mL
Gest. Age on Collection Date: 12.3 weeks
Gestational Age: 18.3 weeks
Maternal Age at EDD: 26.2 yr
Nuchal Translucency (NT): 1 mm
Nuchal Translucency MoM: 0.74
Number of Fetuses: 1
PAPP-A MoM: 0.26
PAPP-A Value: 241 ng/mL
Test Results:: NEGATIVE
Weight: 158 [lb_av]
Weight: 159 [lb_av]
hCG MoM: 0.99
hCG Value: 24.1 IU/mL
uE3 MoM: 1.59

## 2019-02-23 ENCOUNTER — Other Ambulatory Visit: Payer: Self-pay | Admitting: *Deleted

## 2019-02-23 MED ORDER — ALPRAZOLAM 0.5 MG PO TABS: 0.5000 mg | ORAL_TABLET | Freq: Three times a day (TID) | ORAL | 2 refills | Status: DC | PRN

## 2019-02-26 ENCOUNTER — Other Ambulatory Visit: Payer: Self-pay

## 2019-02-26 DIAGNOSIS — Z20822 Contact with and (suspected) exposure to covid-19: Secondary | ICD-10-CM

## 2019-02-28 LAB — NOVEL CORONAVIRUS, NAA: SARS-CoV-2, NAA: NOT DETECTED

## 2019-03-03 ENCOUNTER — Telehealth: Payer: Self-pay | Admitting: Obstetrics & Gynecology

## 2019-03-03 MED ORDER — ALPRAZOLAM 0.5 MG PO TABS: ORAL_TABLET | ORAL | 2 refills | Status: DC

## 2019-03-03 NOTE — Telephone Encounter (Signed)
Patient called, stated that the pharmacy stated that they have not received the Xanax script.  [phone]

## 2019-03-16 ENCOUNTER — Other Ambulatory Visit: Payer: Medicaid Other

## 2019-03-17 ENCOUNTER — Other Ambulatory Visit: Payer: Self-pay

## 2019-03-17 ENCOUNTER — Ambulatory Visit (INDEPENDENT_AMBULATORY_CARE_PROVIDER_SITE_OTHER): Payer: Medicaid Other | Admitting: Obstetrics and Gynecology

## 2019-03-17 ENCOUNTER — Encounter: Payer: Self-pay | Admitting: Obstetrics and Gynecology

## 2019-03-17 VITALS — BP 119/69 | HR 90 | Wt 163.4 lb

## 2019-03-17 DIAGNOSIS — Z1389 Encounter for screening for other disorder: Secondary | ICD-10-CM

## 2019-03-17 DIAGNOSIS — Z331 Pregnant state, incidental: Secondary | ICD-10-CM

## 2019-03-17 DIAGNOSIS — N898 Other specified noninflammatory disorders of vagina: Secondary | ICD-10-CM

## 2019-03-17 DIAGNOSIS — Z3482 Encounter for supervision of other normal pregnancy, second trimester: Secondary | ICD-10-CM

## 2019-03-17 DIAGNOSIS — Z3A22 22 weeks gestation of pregnancy: Secondary | ICD-10-CM

## 2019-03-17 LAB — POCT URINALYSIS DIPSTICK OB
Glucose, UA: NEGATIVE
Ketones, UA: NEGATIVE
Nitrite, UA: NEGATIVE
POC,PROTEIN,UA: NEGATIVE

## 2019-03-17 LAB — POCT WET PREP (WET MOUNT): WBC, Wet Prep HPF POC: POSITIVE

## 2019-03-17 MED ORDER — METRONIDAZOLE 500 MG PO TABS: 500.0000 mg | ORAL_TABLET | Freq: Two times a day (BID) | ORAL | 0 refills | Status: AC

## 2019-03-17 NOTE — Progress Notes (Signed)
Patient ID: Meagan [name], female   DOB: January 16, 1991, 28 y.o.   MRN: [id number]    LOW-RISK PREGNANCY VISIT Patient name: Meagan [name] MRN [id number]  Date of birth: October 16, 1990 Chief Complaint:   Routine Prenatal Visit  History of Present Illness:   Meagan [name] is a 28 y.o. [id number] female at [redacted]w[redacted]d with an Estimated Date of Delivery: 07/18/19 being seen today for ongoing management of a low-risk pregnancy.  Pt declines PAP and internal exam. Went into preterm labor and was sent to [facility], because she works in [place]. Didn't like the experience at [facility], did have cervical exam, cervix long and closed. Has hx of polyhydramnios in previous 3 pregnancies. Had placenta abruption in previous pregnancy @ 68 w 6d. Subchorionic hemorrhage leading to bleeding bleeding for first 3 months of pregnancy. Pre-e in first 2 pregnancies. Today she reports no complaints. Contractions: Irregular. Vag. Bleeding: None.  Movement: Present. denies leaking of fluid. Review of Systems:   Pertinent items are noted in HPI Denies abnormal vaginal discharge w/ itching/odor/irritation, headaches, visual changes, shortness of breath, chest pain, abdominal pain, severe nausea/vomiting, or problems with urination or bowel movements unless otherwise stated above. Pertinent History Reviewed:  Reviewed past medical,surgical, social, obstetrical and family history.  Reviewed problem list, medications and allergies. Physical Assessment:   Vitals:   03/17/19 1608  BP: 119/69  Pulse: 90  Weight: 163 lb 6.4 oz (74.1 kg)  Body mass index is 28.95 kg/m.        Physical Examination:   General appearance: Well appearing, and in no distress  Mental status: Alert, oriented to person, place, and time  Skin: Warm & dry  Cardiovascular: Normal heart rate noted  Respiratory: Normal respiratory effort, no distress  Abdomen: Soft, gravid, nontender  Pelvic: Cervical exam performed, closed and long   Wet prep:  Clue and white cells        Extremities: Edema: None  Fetal Status: Fetal Heart Rate (bpm): 143 Fundal Height: 26 cm Movement: Present    Results for orders placed or performed in visit on 03/17/19 (from the past 24 hour(s))  POC Urinalysis Dipstick OB   Collection Time: 03/17/19  4:09 PM  Result Value Ref Range   Color, UA     Clarity, UA     Glucose, UA Negative Negative   Bilirubin, UA     Ketones, UA neg    Spec Grav, UA     Blood, UA trace    pH, UA     POC,PROTEIN,UA Negative Negative, Trace, Small (1+), Moderate (2+), Large (3+), 4+   Urobilinogen, UA     Nitrite, UA neg    Leukocytes, UA Moderate (2+) (A) Negative   Appearance     Odor      Assessment & Plan:  1) Low-risk pregnancy [id number] at [redacted]w[redacted]d with an Estimated Date of Delivery: 07/18/19    2) BV  Meds: No orders of the defined types were placed in this encounter.  Labs/procedures today: None  Plan:   Metronidazole BID 7 days TOLAC discussion in 4 weeks U/s: anatomy 2-3 weeks  Follow-up: Return in about 4 weeks (around 04/14/2019) for PN2 (sugar test).  Orders Placed This Encounter  Procedures  . POC Urinalysis Dipstick OB   By signing my name below, I, Samul Dada, attest that this documentation has been prepared under the direction and in the presence of Jonnie Kind, MD. Electronically Signed: Cyril. 03/17/19. 4:53 PM.  I personally performed  the services described in this documentation, which was SCRIBED in my presence. The recorded information has been reviewed and considered accurate. It has been edited as necessary during review. Tilda Burrow, MD

## 2019-03-17 NOTE — Addendum Note (Signed)
Addended by: Linton Rump on: 03/17/2019 05:04 PM   Modules accepted: Orders

## 2019-03-19 ENCOUNTER — Telehealth: Payer: Self-pay | Admitting: Neurology

## 2019-03-19 DIAGNOSIS — G43409 Hemiplegic migraine, not intractable, without status migrainosus: Secondary | ICD-10-CM

## 2019-03-19 MED ORDER — BUTALBITAL-APAP-CAFFEINE 50-325-40 MG PO TABS: 1.0000 | ORAL_TABLET | Freq: Four times a day (QID) | ORAL | 0 refills | Status: DC | PRN

## 2019-03-19 NOTE — Telephone Encounter (Signed)
Patient is currently pregnant, complains of severe hemiplegic migraine, fail to improve after Tylenol 500mg  x2 tabs, want to get percocet.  I have Rx fioricet 10 tabs as needed She is to call office tomorrow Sept 18th 2020 if she continues to have significant headaches.

## 2019-03-22 NOTE — Telephone Encounter (Signed)
Thank you so much Dr. Krista Blue.   Meagan [name], can you call her and get her an appointment as soon as possible with myself or [name] this week please? Thank you.

## 2019-03-23 NOTE — Telephone Encounter (Signed)
I spoke with the patient at [phone] and scheduled an appt with [name] NP on Thurs 03/26/2019 @ 9 AM. Pt aware Dr. Jaynee Eagles does not currently have any openings but will be here and can speak with [name] during appt if needed. Pt verbalized appreciation.

## 2019-03-26 ENCOUNTER — Telehealth: Payer: Self-pay

## 2019-03-26 ENCOUNTER — Ambulatory Visit: Payer: Self-pay | Admitting: Family Medicine

## 2019-03-26 NOTE — Telephone Encounter (Signed)
Patient was a no call/no show for their appointment today.   

## 2019-03-30 ENCOUNTER — Other Ambulatory Visit: Payer: Self-pay | Admitting: Obstetrics and Gynecology

## 2019-03-30 ENCOUNTER — Encounter: Payer: Self-pay | Admitting: Family Medicine

## 2019-03-30 DIAGNOSIS — Z8759 Personal history of other complications of pregnancy, childbirth and the puerperium: Secondary | ICD-10-CM

## 2019-03-31 ENCOUNTER — Other Ambulatory Visit: Payer: Self-pay | Admitting: *Deleted

## 2019-03-31 ENCOUNTER — Other Ambulatory Visit: Payer: Self-pay

## 2019-03-31 ENCOUNTER — Ambulatory Visit (INDEPENDENT_AMBULATORY_CARE_PROVIDER_SITE_OTHER): Payer: Medicaid Other

## 2019-03-31 DIAGNOSIS — Z3A24 24 weeks gestation of pregnancy: Secondary | ICD-10-CM

## 2019-03-31 DIAGNOSIS — O09292 Supervision of pregnancy with other poor reproductive or obstetric history, second trimester: Secondary | ICD-10-CM | POA: Diagnosis not present

## 2019-03-31 DIAGNOSIS — Z8759 Personal history of other complications of pregnancy, childbirth and the puerperium: Secondary | ICD-10-CM

## 2019-03-31 DIAGNOSIS — N760 Acute vaginitis: Secondary | ICD-10-CM

## 2019-03-31 DIAGNOSIS — B9689 Other specified bacterial agents as the cause of diseases classified elsewhere: Secondary | ICD-10-CM

## 2019-03-31 MED ORDER — METRONIDAZOLE 0.75 % VA GEL: 1.0000 | Freq: Every day | VAGINAL | 0 refills | Status: AC

## 2019-03-31 NOTE — Progress Notes (Signed)
[name] 24+3 wks,breech,posterior placenta gr 0,cx 3.9 cm,afi 15.6 cm,two LVEICF N/C,FHR 161 BPM,EFW 720 g 50%

## 2019-04-06 ENCOUNTER — Telehealth: Payer: Self-pay | Admitting: *Deleted

## 2019-04-06 NOTE — Telephone Encounter (Signed)
Pt reported that she has started feeling bad yesterday. She was running fever of 101. She has cough, congestion and sneezing. Advised that she can go for covid test in the morning. She can do self treatment at home. Patient agreeable and has no other questions.

## 2019-04-08 ENCOUNTER — Telehealth: Payer: Self-pay | Admitting: *Deleted

## 2019-04-08 MED ORDER — ALBUTEROL SULFATE HFA 108 (90 BASE) MCG/ACT IN AERS: 1.0000 | INHALATION_SPRAY | Freq: Four times a day (QID) | RESPIRATORY_TRACT | 2 refills | Status: DC | PRN

## 2019-04-08 NOTE — Telephone Encounter (Signed)
Patient is requesting refill on albuterol inhaler.

## 2019-04-08 NOTE — Telephone Encounter (Signed)
Refilled inhaler

## 2019-04-08 NOTE — Addendum Note (Signed)
Addended by: Derrek Monaco A on: 04/08/2019 02:53 PM   Modules accepted: Orders

## 2019-04-10 ENCOUNTER — Other Ambulatory Visit: Payer: Self-pay

## 2019-04-10 DIAGNOSIS — Z20822 Contact with and (suspected) exposure to covid-19: Secondary | ICD-10-CM

## 2019-04-12 LAB — NOVEL CORONAVIRUS, NAA: SARS-CoV-2, NAA: NOT DETECTED

## 2019-04-14 ENCOUNTER — Encounter: Payer: Medicaid Other | Admitting: Obstetrics & Gynecology

## 2019-04-14 ENCOUNTER — Other Ambulatory Visit: Payer: Medicaid Other

## 2019-04-18 ENCOUNTER — Inpatient Hospital Stay (HOSPITAL_COMMUNITY)
Admission: AD | Admit: 2019-04-18 | Discharge: 2019-04-18 | Disposition: A | Discharge status: Home / Self Care | Payer: Medicaid Other | Attending: Family Medicine | Admitting: Family Medicine

## 2019-04-18 ENCOUNTER — Other Ambulatory Visit: Payer: Self-pay

## 2019-04-18 ENCOUNTER — Encounter (HOSPITAL_COMMUNITY): Payer: Self-pay | Admitting: Student

## 2019-04-18 DIAGNOSIS — F419 Anxiety disorder, unspecified: Secondary | ICD-10-CM | POA: Diagnosis not present

## 2019-04-18 DIAGNOSIS — O99512 Diseases of the respiratory system complicating pregnancy, second trimester: Secondary | ICD-10-CM | POA: Diagnosis not present

## 2019-04-18 DIAGNOSIS — O99332 Smoking (tobacco) complicating pregnancy, second trimester: Secondary | ICD-10-CM | POA: Insufficient documentation

## 2019-04-18 DIAGNOSIS — O26892 Other specified pregnancy related conditions, second trimester: Secondary | ICD-10-CM

## 2019-04-18 DIAGNOSIS — O360920 Maternal care for other rhesus isoimmunization, second trimester, not applicable or unspecified: Secondary | ICD-10-CM | POA: Diagnosis not present

## 2019-04-18 DIAGNOSIS — O4692 Antepartum hemorrhage, unspecified, second trimester: Secondary | ICD-10-CM | POA: Diagnosis present

## 2019-04-18 DIAGNOSIS — Z3A27 27 weeks gestation of pregnancy: Secondary | ICD-10-CM

## 2019-04-18 DIAGNOSIS — Z6791 Unspecified blood type, Rh negative: Secondary | ICD-10-CM

## 2019-04-18 DIAGNOSIS — O99342 Other mental disorders complicating pregnancy, second trimester: Secondary | ICD-10-CM | POA: Insufficient documentation

## 2019-04-18 DIAGNOSIS — J45909 Unspecified asthma, uncomplicated: Secondary | ICD-10-CM | POA: Insufficient documentation

## 2019-04-18 LAB — CBC
HCT: 34.4 % — ABNORMAL LOW (ref 36.0–46.0)
Hemoglobin: 11.4 g/dL — ABNORMAL LOW (ref 12.0–15.0)
MCH: 31.7 pg (ref 26.0–34.0)
MCHC: 33.1 g/dL (ref 30.0–36.0)
MCV: 95.6 fL (ref 80.0–100.0)
Platelets: 196 10*3/uL (ref 150–400)
RBC: 3.6 MIL/uL — ABNORMAL LOW (ref 3.87–5.11)
RDW: 13.1 % (ref 11.5–15.5)
WBC: 10.3 10*3/uL (ref 4.0–10.5)
nRBC: 0 % (ref 0.0–0.2)

## 2019-04-18 LAB — WET PREP, GENITAL
Clue Cells Wet Prep HPF POC: NONE SEEN
Sperm: NONE SEEN
Trich, Wet Prep: NONE SEEN
Yeast Wet Prep HPF POC: NONE SEEN

## 2019-04-18 MED ORDER — RHO D IMMUNE GLOBULIN 1500 UNIT/2ML IJ SOSY
300.0000 ug | PREFILLED_SYRINGE | Freq: Once | INTRAMUSCULAR | Status: AC
  Administered 2019-04-18: 300 ug via INTRAMUSCULAR
  Filled 2019-04-18: qty 2

## 2019-04-18 NOTE — Discharge Instructions (Signed)
Activity Restriction During Pregnancy Your health care provider may recommend specific activity restrictions during pregnancy for a variety of reasons. Activity restriction may require that you limit activities that require great effort, such as exercise, lifting, or sex. The type of activity restriction will vary for each person, depending on your risk or the problems you are having. Activity restriction may be recommended for a period of time until your baby is delivered. Why are activity restrictions recommended? Activity restriction may be recommended if:  Your placenta is partially or completely covering the opening of your cervix (placenta previa).  There is bleeding between the wall of the uterus and the amniotic sac in the first trimester of pregnancy (subchorionic hemorrhage).  You went into labor too early (preterm labor).  You have a history of miscarriage.  You have a condition that causes high blood pressure during pregnancy (preeclampsia or eclampsia).  You are pregnant with more than one baby.  Your baby is not growing well. What are the risks? The risks depend on your specific restriction. Strict bed rest has the most physical and emotional risks and is no longer routinely recommended. Risks of strict bed rest include:  Loss of muscle conditioning from not moving.  Blood clots.  Social isolation.  Depression.  Loss of income. Talk with your health care team about activity restriction to decide if it is best for you and your baby. Even if you are having problems during your pregnancy, you may be able to continue with normal levels of activity with careful monitoring by your health care team. Follow these instructions at home: If needed, based on your overall health and the health of your baby, your health care provider will decide which type of activity restriction is right for you. Activity restrictions may include:  Not lifting anything heavier than 10 pounds  (4.5 kg).  Avoiding activities that take a lot of physical effort.  No lifting or straining.  Resting in a sitting position or lying down for periods of time during the day. Pelvic rest may be recommended along with activity restrictions. If pelvic rest is recommended, then:  Do not have sex, an orgasm, or use sexual stimulation.  Do not use tampons. Do not douche. Do not put anything into your vagina.  Do not lift anything that is heavier than 10 lb (4.5 kg).  Avoid activities that require a lot of effort.  Avoid any activity in which your pelvic muscles could become strained, such as squatting. Questions to ask your health care provider  Why is my activity being limited?  How will activity restrictions affect my body?  Why is rest helpful for me and my baby?  What activities can I do?  When can I return to normal activities? When should I seek immediate medical care? Seek immediate medical care if you have:  Vaginal bleeding.  Vaginal discharge.  Cramping pain in your lower abdomen.  Regular contractions.  A low, dull backache. Summary  Your health care provider may recommend specific activity restrictions during pregnancy for a variety of reasons.  Activity restriction may require that you limit activities such as exercise, lifting, sex, or any other activity that requires great effort.  Discuss the risks and benefits of activity restriction with your health care team to decide if it is best for you and your baby.  Contact your health care provider right away if you think you are having contractions, or if you notice vaginal bleeding, discharge, or cramping. This information is  not intended to replace advice given to you by your health care provider. Make sure you discuss any questions you have with your health care provider. Document Released: 10/13/2010 Document Revised: 10/08/2017 Document Reviewed: 10/08/2017 Elsevier Patient Education  2020 Elsevier  Inc.    Vaginal Bleeding During Pregnancy, Second Trimester  A small amount of bleeding (spotting) from the vagina is relatively common during pregnancy. It usually stops on its own. Various things can cause spotting during pregnancy. Sometimes the bleeding is normal and is not a sign of a problem in the pregnancy. However, bleeding can also be a sign of something serious. Be sure to tell your health care provider about any vaginal bleeding right away. Some possible causes of vaginal bleeding during the second trimester include:  Infection, inflammation, or growths (polyps) on the cervix.  A condition in which the placenta partially or completely covers the opening of the cervix inside the uterus (placenta previa).  The placenta separating from the uterus (placenta abruption).  Early (preterm) labor.  The cervix opening and thinning before pregnancy is at term and before labor starts (cervical insufficiency).  A mass of tissue developing in the uterus due to an egg being fertilized incorrectly (molar pregnancy). Follow these instructions at home: Activity  Follow instructions from your health care provider about limiting your activity. Ask what activities are safe for you.  If needed, make plans for someone to help with your regular activities.  Do not exercise or do activities that take a lot of effort unless your health care provider approves.  Do not lift anything that is heavier than 10 lb (4.5 kg), or the limit that your health care provider tells you, until he or she says that it is safe.  Do not have sex or orgasms until your health care provider says that this is safe. Medicines  Take over-the-counter and prescription medicines only as told by your health care provider.  Do not take aspirin because it can cause bleeding. General instructions  Pay attention to any changes in your symptoms.  Write down how many pads you use each day, how often you change pads, and how  soaked (saturated) they are.  Do not use tampons or douche.  If you pass any tissue from your vagina, save the tissue so you can show it to your health care provider.  Keep all follow-up visits as told by your health care provider. This is important. Contact a health care provider if:  You have vaginal bleeding during any time of your pregnancy.  You have cramps or labor pains.  You have a fever that does not get better when you take medicines. Get help right away if:  You have severe cramps in your back or abdomen.  You have contractions.  You have chills.  You pass large clots or a large amount of tissue from your vagina.  Your bleeding increases.  You feel light-headed or weak, or you faint.  You are leaking fluid or have a gush of fluid from your vagina. Summary  Various things can cause bleeding or spotting in pregnancy.  Be sure to tell your health care provider about any vaginal bleeding right away.  Follow instructions from your health care provider about limiting your activity. Ask what activities are safe for you. This information is not intended to replace advice given to you by your health care provider. Make sure you discuss any questions you have with your health care provider. Document Released: 03/28/2005 Document Revised: 10/07/2018 Document  Reviewed: 09/20/2016 Elsevier Patient Education  [employer].

## 2019-04-18 NOTE — MAU Note (Signed)
Meagan [name] is a 28 y.o. at [redacted]w[redacted]d here in MAU reporting: woke up this AM and saw some blood in her underwear. When she wiped in the bathroom she saw more bleeding. Has hx of abruption. Having some lower abdominal cramping. Last IC was this morning. States she has been having sex for the past 2 weeks with no bleeding. No LOF. Has not felt any fetal movement today.  Onset of complaint: today  Pain score: 4/10  Vitals:   04/18/19 0812  BP: 122/75  Pulse: (!) 104  Resp: 16  Temp: 98 F (36.7 C)  SpO2: 99%     FHT: 150  Lab orders placed from triage: UA, used bathroom in the lobby so is unable to give sample currently

## 2019-04-18 NOTE — MAU Note (Signed)
Pt called, SO states she is in the bathroom

## 2019-04-18 NOTE — MAU Provider Note (Signed)
Chief Complaint:  Abdominal Pain, Vaginal Bleeding, and Decreased Fetal Movement   First Provider Initiated Contact with Patient 04/18/19 0843     HPI: Meagan [name] is a 28 y.o. [id number] at [redacted]w[redacted]d who presents to maternity admissions reporting vaginal bleeding & abdominal cramping. Woke up this morning & saw some dry red blood in her underwear. Had intercourse before & after episode of bleeding. After second sexual interaction, she noted some more dark red blood on toilet paper. No bleeding into pads or passing blood clots. Has had some abdominal cramping since then. Hx of abruption last year & is concerned.  Reported to the nurse that she had noted decreased fetal movement, but to me reports good fetal movement.  No LOF, dysuria, or vaginal discharge.   Location: abdomen Quality: cramping Severity: 4/10 in pain scale Duration: hours Timing: intermittent Modifying factors: none Associated signs and symptoms: vaginal bleeding   Past Medical History:  Diagnosis Date  . Abdominal cramps 12/06/2015  . Anxiety   . Asthma    inhaler at bedside  . Daily headache   . Diarrhea 06/04/2013  . Hemiplegic migraine    blurred vision, nausea, dizziness, numbness  . Kidney stones   . Mental disorder    huffed inhalants "affected brain"  . Pyelonephritis complicating pregnancy, antepartum, first trimester 11/05/2011  . Stomach ulcer    OB History  Gravida Para Term Preterm AB Living  5 4 3 1  0 4  SAB TAB Ectopic Multiple Live Births  0 0 0 0 4    # Outcome Date GA Lbr Len/2nd Weight Sex Delivery Anes PTL Lv  5 Current           4 Preterm 12/19/17 [redacted]w[redacted]d  2260 g F CS-LVertical EPI  LIV     Complications: Abruptio Placenta, Polyhydramnios  3 Term 06/18/16 [redacted]w[redacted]d 04:13 / 00:04 3715 g F Vag-Spont EPI N LIV     Complications: Polyhydramnios  2 Term 02/13/14 [redacted]w[redacted]d 03:14 / 00:05 3065 g F Vag-Spont EPI N LIV     Complications: Pre-eclampsia  1 Term 05/30/12 [redacted]w[redacted]d 15:31 / 04:14 3572 g M  Vag-Spont EPI N LIV     Complications: Preeclampsia    Obstetric Comments  G4=LTCS (not vertical) per op note   Past Surgical History:  Procedure Laterality Date  . ANKLE SURGERY Left 09/2016   plates & screws  . CESAREAN SECTION N/A 12/19/2017   Procedure: CESAREAN SECTION;  Surgeon: Florian Buff, MD;  Location: [place];  Service: Obstetrics;  Laterality: N/A;   Family History  Problem Relation Age of Onset  . Cancer Maternal Grandmother        breast  . Cancer Paternal Grandfather        lung cancer  . Migraines Father   . Hypertension Father   . Hypercholesterolemia Father   . Other Son        ventricular defect  . Hypertension Other    Social History   Tobacco Use  . Smoking status: Current Every Day Smoker    Packs/day: 1.50    Years: 7.00    Pack years: 10.50    Types: Cigarettes  . Smokeless tobacco: Never Used  Substance Use Topics  . Alcohol use: Not Currently    Alcohol/week: 0.0 standard drinks    Frequency: Never    Comment: occ; not now  . Drug use: Not Currently    Types: Other-see comments, Marijuana    Comment: huffed inhalants in past, "affected Brain"  Allergies  Allergen Reactions  . Codeine Nausea And Vomiting and Other (See Comments)    Vertigo  . Penicillins Rash and Other (See Comments)    Unknown:  Childhood reaction.  Has patient had a PCN reaction causing immediate rash, facial/tongue/throat swelling, SOB or lightheadedness with hypotension: Unknown Has patient had a PCN reaction causing severe rash involving mucus membranes or skin necrosis: Unknown Has patient had a PCN reaction that required hospitalization No Has patient had a PCN reaction occurring within the last 10 years: No If all of the above answers are "NO", then may pro  . Sulfamethoxazole-Trimethoprim Hives   No medications prior to admission.    I have reviewed patient's Past Medical Hx, Surgical Hx, Family Hx, Social Hx, medications and allergies.    ROS:  Review of Systems  Constitutional: Negative.   Gastrointestinal: Positive for abdominal pain. Negative for constipation, diarrhea, nausea and vomiting.  Genitourinary: Positive for vaginal bleeding. Negative for dysuria and vaginal discharge.    Physical Exam   Patient Vitals for the past 24 hrs:  BP Temp Temp src Pulse Resp SpO2 Height Weight  04/18/19 1215 111/62 - - 86 16 99 % - -  04/18/19 0812 122/75 98 F (36.7 C) Oral (!) 104 16 99 % - -  04/18/19 0805 - - - - - - 5\' 3"  (1.6 m) 79.8 kg    Constitutional: Well-developed, well-nourished female in no acute distress.  Cardiovascular: normal rate & rhythm, no murmur Respiratory: normal effort, lung sounds clear throughout GI: Abd soft, non-tender, gravid appropriate for gestational age. Pos BS x 4 MS: Extremities nontender, no edema, normal ROM Neurologic: Alert and oriented x 4.  GU:      Pelvic: NEFG, no active bleeding. Cervix not friable. Some mucoid discharge coming from os that is pink tinged. Cervix visually closed.   NST:  Baseline: 145 bpm, Variability: Good {> 6 bpm), Accelerations: Reactive, Decelerations: Absent and ctx x2   Labs: Results for orders placed or performed during the hospital encounter of 04/18/19 (from the past 24 hour(s))  Wet prep, genital     Status: Abnormal   Collection Time: 04/18/19  8:53 AM  Result Value Ref Range   Yeast Wet Prep HPF POC NONE SEEN NONE SEEN   Trich, Wet Prep NONE SEEN NONE SEEN   Clue Cells Wet Prep HPF POC NONE SEEN NONE SEEN   WBC, Wet Prep HPF POC MANY (A) NONE SEEN   Sperm NONE SEEN   CBC     Status: Abnormal   Collection Time: 04/18/19  9:29 AM  Result Value Ref Range   WBC 10.3 4.0 - 10.5 K/uL   RBC 3.60 (L) 3.87 - 5.11 MIL/uL   Hemoglobin 11.4 (L) 12.0 - 15.0 g/dL   HCT 16.134.4 (L) 09.636.0 - 04.546.0 %   MCV 95.6 80.0 - 100.0 fL   MCH 31.7 26.0 - 34.0 pg   MCHC 33.1 30.0 - 36.0 g/dL   RDW 40.913.1 81.111.5 - 91.415.5 %   Platelets 196 150 - 400 K/uL   nRBC 0.0 0.0 - 0.2  %  Rh IG workup (includes ABO/Rh)     Status: None (Preliminary result)   Collection Time: 04/18/19  9:29 AM  Result Value Ref Range   Gestational Age(Wks) 27    ABO/RH(D) A NEG    Antibody Screen NEG    Fetal Screen NEG    Unit Number [id number]    Blood Component Type RHIG    Unit division 00  Status of Unit ISSUED    Transfusion Status      OK TO TRANSFUSE Performed at [facility], [address] [place], [place] [address]     Imaging:  No results found.  MAU Course: Orders Placed This Encounter  Procedures  . Wet prep, genital  . Urinalysis, Routine w reflex microscopic  . CBC  . Rh IG workup (includes ABO/Rh)  . Discharge patient   Meds ordered this encounter  Medications  . rho (d) immune globulin (RHIG/RHOPHYLAC) injection 300 mcg    MDM: Category 1 fetal tracing. Ctx x 2 in over 1 hour of monitoring. Abdomen soft & non tender. No bleeding noted on exam.  RH negative; has not received rhogam in office yet. Rhogam given this morning in MAU.   C/w Dr. Shawnie Pons. Exam reassuring today. Will give patient option for inpatient monitoring x 5-7 days vs outpatient monitoring for patient to return if another bleeding episode.   Pt prefers to go home. [facility]. Reviewed reasons to return to MAU  Assessment: 1. Vaginal bleeding in pregnancy, second trimester   2. [redacted] weeks gestation of pregnancy   3. Rh negative state in antepartum period, second trimester     Plan: Discharge home in stable condition.  PTL Labor precautions and fetal kick counts Pelvic rest x 1 week after bleeding episode. Pt to return if starts to bleed again Reschedule appt with [facility] GC/CT pending  Follow-up Information    [facility] Follow up.   Specialty: Obstetrics and Gynecology Why: return for worsening symptoms Contact information: [address] [id number] [place]  [address] [phone]          Allergies as of 04/18/2019      Reactions   Codeine Nausea And Vomiting, Other (See Comments)   Vertigo   Penicillins Rash, Other (See Comments)   Unknown:  Childhood reaction.  Has patient had a PCN reaction causing immediate rash, facial/tongue/throat swelling, SOB or lightheadedness with hypotension: Unknown Has patient had a PCN reaction causing severe rash involving mucus membranes or skin necrosis: Unknown Has patient had a PCN reaction that required hospitalization No Has patient had a PCN reaction occurring within the last 10 years: No If all of the above answers are "NO", then may pro   Sulfamethoxazole-trimethoprim Hives      Medication List    TAKE these medications   acetaminophen 500 MG tablet Commonly known as: TYLENOL Take 1,000 mg by mouth every 6 (six) hours as needed for moderate pain.   albuterol 108 (90 Base) MCG/ACT inhaler Commonly known as: VENTOLIN HFA Inhale 1-2 puffs into the lungs every 6 (six) hours as needed for wheezing or shortness of breath.   ALPRAZolam 0.5 MG tablet Commonly known as: XANAX 1 three time daily for anxiety   aspirin EC 81 MG tablet Take 162 mg by mouth daily.   Blood Pressure Monitor Misc For regular home bp monitoring during pregnancy   butalbital-acetaminophen-caffeine 50-325-40 MG tablet Commonly known as: FIORICET Take 1 tablet by mouth every 6 (six) hours as needed for headache.   nortriptyline 25 MG capsule Commonly known as: PAMELOR Take 1 capsule (25 mg total) by mouth at bedtime. APPOINTMENT NEEDED FOR FURTHER REFILLS CALL [phone]   prenatal vitamin w/FE, FA 27-1 MG Tabs tablet Take 1 tablet by mouth daily at 12 noon.       Judeth Horn, NP 04/18/2019 2:47 PM

## 2019-04-19 LAB — RH IG WORKUP (INCLUDES ABO/RH)
ABO/RH(D): A NEG
Antibody Screen: NEGATIVE
Fetal Screen: NEGATIVE
Gestational Age(Wks): 27
Unit division: 0

## 2019-04-20 NOTE — Progress Notes (Signed)
Erroneous encounter

## 2019-04-22 LAB — GC/CHLAMYDIA PROBE AMP (~~LOC~~) NOT AT ARMC
Chlamydia: NEGATIVE
Comment: NEGATIVE
Comment: NORMAL
Neisseria Gonorrhea: NEGATIVE

## 2019-04-24 ENCOUNTER — Telehealth: Payer: Self-pay | Admitting: *Deleted

## 2019-04-24 NOTE — Telephone Encounter (Signed)
Patient left message that she is having trouble with constipation and hemorrhoids.

## 2019-04-27 ENCOUNTER — Encounter: Payer: Medicaid Other | Admitting: Women's Health

## 2019-04-27 ENCOUNTER — Other Ambulatory Visit: Payer: Medicaid Other

## 2019-04-30 ENCOUNTER — Other Ambulatory Visit: Payer: Medicaid Other

## 2019-04-30 ENCOUNTER — Encounter: Payer: Medicaid Other | Admitting: Women's Health

## 2019-05-04 ENCOUNTER — Telehealth: Payer: Self-pay | Admitting: *Deleted

## 2019-05-04 ENCOUNTER — Encounter: Payer: Self-pay | Admitting: *Deleted

## 2019-05-04 NOTE — Telephone Encounter (Signed)
LMOVM returning patients call. Mychart message sent as well.  

## 2019-05-04 NOTE — Telephone Encounter (Signed)
Pt left message that she missed her appt for PN2. She wants to get it rescheduled for this week.

## 2019-05-05 NOTE — Telephone Encounter (Signed)
Spoke with pt. Pt can't do Wednesday @ 9:00 for PN2. She works on Wednesdays. Pt is requesting Friday am. I advised to call back after 1:30 and speak with the front staff. They are at lunch now. Pt voiced understanding. [place]

## 2019-05-12 ENCOUNTER — Telehealth: Payer: Self-pay | Admitting: *Deleted

## 2019-05-12 NOTE — Telephone Encounter (Signed)
Called patient back she states that she has had two episodes of pressure today. She denies any leaking of fluid, vaginal bleeding, contractions or decreased movement. Advised patient to keep appointment on Friday and call us if she has any new symptoms. Patient agreeable. No other questions at this time.

## 2019-05-12 NOTE — Telephone Encounter (Signed)
Pt left message that she is having some pressure and wants to speak with someone about it.

## 2019-05-15 ENCOUNTER — Other Ambulatory Visit: Payer: Self-pay

## 2019-05-15 ENCOUNTER — Encounter: Payer: Self-pay | Admitting: Obstetrics & Gynecology

## 2019-05-15 ENCOUNTER — Ambulatory Visit (INDEPENDENT_AMBULATORY_CARE_PROVIDER_SITE_OTHER): Payer: Medicaid Other | Admitting: Obstetrics & Gynecology

## 2019-05-15 ENCOUNTER — Other Ambulatory Visit: Payer: Medicaid Other

## 2019-05-15 VITALS — BP 119/68 | HR 97 | Wt 180.0 lb

## 2019-05-15 DIAGNOSIS — Z331 Pregnant state, incidental: Secondary | ICD-10-CM

## 2019-05-15 DIAGNOSIS — Z98891 History of uterine scar from previous surgery: Secondary | ICD-10-CM

## 2019-05-15 DIAGNOSIS — Z3493 Encounter for supervision of normal pregnancy, unspecified, third trimester: Secondary | ICD-10-CM

## 2019-05-15 DIAGNOSIS — Z3A30 30 weeks gestation of pregnancy: Secondary | ICD-10-CM

## 2019-05-15 DIAGNOSIS — Z1389 Encounter for screening for other disorder: Secondary | ICD-10-CM

## 2019-05-15 LAB — POCT URINALYSIS DIPSTICK OB
Blood, UA: NEGATIVE
Glucose, UA: NEGATIVE
Ketones, UA: NEGATIVE
Leukocytes, UA: NEGATIVE
Nitrite, UA: NEGATIVE
POC,PROTEIN,UA: NEGATIVE

## 2019-05-15 NOTE — Progress Notes (Signed)
   LOW-RISK PREGNANCY VISIT Patient name: Meagan [name] MRN [id number]  Date of birth: 1990/09/19 Chief Complaint:   Routine Prenatal Visit  History of Present Illness:   Meagan [name] is a 28 y.o. [id number] female at [redacted]w[redacted]d with an Estimated Date of Delivery: 07/18/19 being seen today for ongoing management of a low-risk pregnancy.  Today she reports pelvic pressure. Contractions: Irregular.  .  Movement: Present. denies leaking of fluid. Review of Systems:   Pertinent items are noted in HPI Denies abnormal vaginal discharge w/ itching/odor/irritation, headaches, visual changes, shortness of breath, chest pain, abdominal pain, severe nausea/vomiting, or problems with urination or bowel movements unless otherwise stated above. Pertinent History Reviewed:  Reviewed past medical,surgical, social, obstetrical and family history.  Reviewed problem list, medications and allergies. Physical Assessment:   Vitals:   05/15/19 0853  BP: 119/68  Pulse: 97  Weight: 180 lb (81.6 kg)  Body mass index is 31.89 kg/m.        Physical Examination:   General appearance: Well appearing, and in no distress  Mental status: Alert, oriented to person, place, and time  Skin: Warm & dry  Cardiovascular: Normal heart rate noted  Respiratory: Normal respiratory effort, no distress  Abdomen: Soft, gravid, nontender  Pelvic: Cervical exam performed         Extremities:    Fetal Status: Fetal Heart Rate (bpm): 148 Fundal Height: 30 cm Movement: Present    Chaperone: [place]    Results for orders placed or performed in visit on 05/15/19 (from the past 24 hour(s))  POC Urinalysis Dipstick OB   Collection Time: 05/15/19  8:56 AM  Result Value Ref Range   Color, UA     Clarity, UA     Glucose, UA Negative Negative   Bilirubin, UA     Ketones, UA n    Spec Grav, UA     Blood, UA n    pH, UA     POC,PROTEIN,UA Negative Negative, Trace, Small (1+), Moderate (2+), Large (3+), 4+   Urobilinogen, UA     Nitrite, UA n    Leukocytes, UA Negative Negative   Appearance     Odor      Assessment & Plan:  1) Low-risk pregnancy [id number] at [redacted]w[redacted]d with an Estimated Date of Delivery: 07/18/19   2) Previous C section for TOLAC, sign BTL papaers today   Meds: No orders of the defined types were placed in this encounter.  Labs/procedures today:   Plan:  Continue routine obstetrical care  Next visit: prefers in person    Reviewed: Preterm labor symptoms and general obstetric precautions including but not limited to vaginal bleeding, contractions, leaking of fluid and fetal movement were reviewed in detail with the patient.  All questions were answered. Has home bp cuff. Rx faxed to . Check bp weekly, let us know if >140/90.   Follow-up: Return in about 2 weeks (around 05/29/2019) for [place].  Orders Placed This Encounter  Procedures  . POC Urinalysis Dipstick OB   Florian Buff  05/15/2019 9:23 AM

## 2019-05-19 LAB — CBC
Hematocrit: 34.7 % (ref 34.0–46.6)
Hemoglobin: 11.7 g/dL (ref 11.1–15.9)
MCH: 31.7 pg (ref 26.6–33.0)
MCHC: 33.7 g/dL (ref 31.5–35.7)
MCV: 94 fL (ref 79–97)
Platelets: 171 10*3/uL (ref 150–450)
RBC: 3.69 x10E6/uL — ABNORMAL LOW (ref 3.77–5.28)
RDW: 12.6 % (ref 11.7–15.4)
WBC: 10.3 10*3/uL (ref 3.4–10.8)

## 2019-05-19 LAB — AB SCR+ANTIBODY ID: Antibody Screen: POSITIVE — AB

## 2019-05-19 LAB — GLUCOSE TOLERANCE, 2 HOURS W/ 1HR
Glucose, 1 hour: 120 mg/dL (ref 65–179)
Glucose, 2 hour: 75 mg/dL (ref 65–152)
Glucose, Fasting: 69 mg/dL (ref 65–91)

## 2019-05-19 LAB — HIV ANTIBODY (ROUTINE TESTING W REFLEX): HIV Screen 4th Generation wRfx: NONREACTIVE

## 2019-05-19 LAB — RPR: RPR Ser Ql: NONREACTIVE

## 2019-05-19 LAB — ANTIBODY SCREEN

## 2019-05-21 ENCOUNTER — Telehealth: Payer: Self-pay | Admitting: *Deleted

## 2019-05-21 NOTE — Telephone Encounter (Signed)
Patient informed she passed glucola.  Verbalized understanding with no further questions.

## 2019-05-21 NOTE — Telephone Encounter (Signed)
Patient called for glucose results.

## 2019-05-25 ENCOUNTER — Other Ambulatory Visit: Payer: Self-pay

## 2019-05-25 ENCOUNTER — Other Ambulatory Visit: Payer: Self-pay | Admitting: Obstetrics & Gynecology

## 2019-05-25 ENCOUNTER — Inpatient Hospital Stay (HOSPITAL_COMMUNITY)
Admission: AD | Admit: 2019-05-25 | Discharge: 2019-05-25 | Disposition: A | Discharge status: Home / Self Care | Payer: Medicaid Other | Attending: Obstetrics and Gynecology | Admitting: Obstetrics and Gynecology

## 2019-05-25 ENCOUNTER — Encounter (HOSPITAL_COMMUNITY): Payer: Self-pay

## 2019-05-25 DIAGNOSIS — Z880 Allergy status to penicillin: Secondary | ICD-10-CM | POA: Diagnosis not present

## 2019-05-25 DIAGNOSIS — Z885 Allergy status to narcotic agent status: Secondary | ICD-10-CM | POA: Diagnosis not present

## 2019-05-25 DIAGNOSIS — Z87442 Personal history of urinary calculi: Secondary | ICD-10-CM | POA: Insufficient documentation

## 2019-05-25 DIAGNOSIS — Z882 Allergy status to sulfonamides status: Secondary | ICD-10-CM | POA: Insufficient documentation

## 2019-05-25 DIAGNOSIS — O34219 Maternal care for unspecified type scar from previous cesarean delivery: Secondary | ICD-10-CM | POA: Insufficient documentation

## 2019-05-25 DIAGNOSIS — O99343 Other mental disorders complicating pregnancy, third trimester: Secondary | ICD-10-CM | POA: Diagnosis not present

## 2019-05-25 DIAGNOSIS — Z8711 Personal history of peptic ulcer disease: Secondary | ICD-10-CM | POA: Diagnosis not present

## 2019-05-25 DIAGNOSIS — O99333 Smoking (tobacco) complicating pregnancy, third trimester: Secondary | ICD-10-CM | POA: Diagnosis not present

## 2019-05-25 DIAGNOSIS — Z3A32 32 weeks gestation of pregnancy: Secondary | ICD-10-CM | POA: Diagnosis not present

## 2019-05-25 DIAGNOSIS — O26893 Other specified pregnancy related conditions, third trimester: Secondary | ICD-10-CM | POA: Diagnosis not present

## 2019-05-25 DIAGNOSIS — R12 Heartburn: Secondary | ICD-10-CM

## 2019-05-25 DIAGNOSIS — J45909 Unspecified asthma, uncomplicated: Secondary | ICD-10-CM | POA: Diagnosis not present

## 2019-05-25 DIAGNOSIS — O99513 Diseases of the respiratory system complicating pregnancy, third trimester: Secondary | ICD-10-CM | POA: Insufficient documentation

## 2019-05-25 DIAGNOSIS — F17210 Nicotine dependence, cigarettes, uncomplicated: Secondary | ICD-10-CM | POA: Insufficient documentation

## 2019-05-25 DIAGNOSIS — Z8249 Family history of ischemic heart disease and other diseases of the circulatory system: Secondary | ICD-10-CM | POA: Diagnosis not present

## 2019-05-25 DIAGNOSIS — F419 Anxiety disorder, unspecified: Secondary | ICD-10-CM

## 2019-05-25 DIAGNOSIS — R112 Nausea with vomiting, unspecified: Secondary | ICD-10-CM | POA: Diagnosis present

## 2019-05-25 LAB — COMPREHENSIVE METABOLIC PANEL
ALT: 12 U/L (ref 0–44)
AST: 8 U/L — ABNORMAL LOW (ref 15–41)
Albumin: 2.4 g/dL — ABNORMAL LOW (ref 3.5–5.0)
Alkaline Phosphatase: 63 U/L (ref 38–126)
Anion gap: 6 (ref 5–15)
BUN: 5 mg/dL — ABNORMAL LOW (ref 6–20)
CO2: 22 mmol/L (ref 22–32)
Calcium: 8.5 mg/dL — ABNORMAL LOW (ref 8.9–10.3)
Chloride: 106 mmol/L (ref 98–111)
Creatinine, Ser: 0.52 mg/dL (ref 0.44–1.00)
GFR calc Af Amer: >60 mL/min (ref >60)
GFR calc non Af Amer: >60 mL/min (ref >60)
Glucose, Bld: 77 mg/dL (ref 70–99)
Potassium: 3.8 mmol/L (ref 3.5–5.1)
Sodium: 134 mmol/L — ABNORMAL LOW (ref 135–145)
Total Bilirubin: 0.5 mg/dL (ref 0.3–1.2)
Total Protein: 5.4 g/dL — ABNORMAL LOW (ref 6.5–8.1)

## 2019-05-25 LAB — URINALYSIS, ROUTINE W REFLEX MICROSCOPIC
Bilirubin Urine: NEGATIVE
Glucose, UA: NEGATIVE mg/dL
Hgb urine dipstick: NEGATIVE
Ketones, ur: NEGATIVE mg/dL
Nitrite: NEGATIVE
Protein, ur: NEGATIVE mg/dL
Specific Gravity, Urine: 1.019 (ref 1.005–1.030)
Squamous Epithelial / HPF: >50 — ABNORMAL HIGH (ref 0–5)
pH: 6 (ref 5.0–8.0)

## 2019-05-25 LAB — HEMOGLOBIN AND HEMATOCRIT, BLOOD
HCT: 33.6 % — ABNORMAL LOW (ref 36.0–46.0)
Hemoglobin: 11.3 g/dL — ABNORMAL LOW (ref 12.0–15.0)

## 2019-05-25 LAB — LIPASE, BLOOD: Lipase: 27 U/L (ref 11–51)

## 2019-05-25 MED ORDER — PANTOPRAZOLE SODIUM 40 MG PO TBEC
40.0000 mg | DELAYED_RELEASE_TABLET | Freq: Every day | ORAL | Status: DC
  Administered 2019-05-25: 40 mg via ORAL
  Filled 2019-05-25: qty 1

## 2019-05-25 NOTE — MAU Provider Note (Signed)
Chief Complaint:  Emesis   First Provider Initiated Contact with Patient 05/25/19 1154     HPI: Meagan [name] is a 28 y.o. [id number] at [redacted]w[redacted]d who presents to maternity admissions reporting n/v, epigastric pain, and anxiety.   Reports n/v for the last week. Normally vomits about once per day. Epigastric pain & change in stools started last week as well. Reports mid epigastric pain that feels achy in nature. Pain normally occurs 3-4 hours after eating. Currently rates pain 3/10. Is supposed to be taking omeprazole but hasn't taken it during this pregnancy. Otherwise has not treated her symptoms. Has a hx of stomach ulcer in 2014 but states she hasn't seen GI since then. Has had 3-4 stools per day for the last week; states not loose or watery but is green in color.  Ate pancakes, grits, & sausage this morning. Has only had mountain dew & coke to drink today.   Longstanding history of anxiety. Currently takes xanax TID. Took a dose this morning around 730 am but vomited shortly after so took another dose at 930.  Reports increase in anxiety related to her significant other, recent drop in work hours, and upcoming delivery. No SI/HI. Currently does not see a Veterinary surgeon.   States she's been having some braxton hicks recently. Mostly non painful and occurs 10-15 times per day. Denies LOF or vaginal bleeding. Good fetal movement.   Pregnancy Course: [facility] for prenatal care  Past Medical History:  Diagnosis Date  . Abdominal cramps 12/06/2015  . Anxiety   . Asthma    inhaler at bedside  . Daily headache   . Diarrhea 06/04/2013  . Hemiplegic migraine    blurred vision, nausea, dizziness, numbness  . Kidney stones   . Mental disorder    huffed inhalants "affected brain"  . Pyelonephritis complicating pregnancy, antepartum, first trimester 11/05/2011  . Stomach ulcer    OB History  Gravida Para Term Preterm AB Living  0 4  SAB TAB Ectopic Multiple Live Births  0 0 0 0 4     # Outcome Date GA Lbr Len/2nd Weight Sex Delivery Anes PTL Lv  5 Current           4 Preterm 12/19/17 [redacted]w[redacted]d  2260 g F CS-LVertical EPI  LIV     Complications: Abruptio Placenta, Polyhydramnios  3 Term 06/18/16 [redacted]w[redacted]d 04:13 / 00:04 3715 g F Vag-Spont EPI N LIV     Complications: Polyhydramnios  2 Term 02/13/14 [redacted]w[redacted]d 03:14 / 00:05 3065 g F Vag-Spont EPI N LIV     Complications: Pre-eclampsia  1 Term 05/30/12 [redacted]w[redacted]d 15:31 / 04:14 3572 g M Vag-Spont EPI N LIV     Complications: Preeclampsia    Obstetric Comments  G4=LTCS (not vertical) per op note   Past Surgical History:  Procedure Laterality Date  . ANKLE SURGERY Left 09/2016   plates & screws  . CESAREAN SECTION N/A 12/19/2017   Procedure: CESAREAN SECTION;  Surgeon: Lazaro Arms, MD;  Location: [facility] BIRTHING SU[facility];  Service: Obstetrics;  Laterality: N/A;   Family History  Problem Relation Age of Onset  . Cancer Maternal Grandmother        breast  . Cancer Paternal Grandfather        lung cancer  . Migraines Father   . Hypertension Father   . Hypercholesterolemia Father   . Other Son        ventricular defect  . Hypertension Other    Social  History   Tobacco Use  . Smoking status: Current Every Day Smoker    Packs/day: 1.50    Years: 7.00    Pack years: 10.50    Types: Cigarettes  . Smokeless tobacco: Never Used  Substance Use Topics  . Alcohol use: Not Currently    Alcohol/week: 0.0 standard drinks    Frequency: Never    Comment: occ; not now  . Drug use: Not Currently    Types: Other-see comments, Marijuana    Comment: huffed inhalants in past, "affected Brain"   Allergies  Allergen Reactions  . Codeine Nausea And Vomiting and Other (See Comments)    Vertigo  . Penicillins Rash and Other (See Comments)    Unknown:  Childhood reaction.  Has patient had a PCN reaction causing immediate rash, facial/tongue/throat swelling, SOB or lightheadedness with hypotension: Unknown Has patient had a PCN reaction causing  severe rash involving mucus membranes or skin necrosis: Unknown Has patient had a PCN reaction that required hospitalization No Has patient had a PCN reaction occurring within the last 10 years: No If all of the above answers are "NO", then may pro  . Sulfamethoxazole-Trimethoprim Hives   No medications prior to admission.    I have reviewed patient's Past Medical Hx, Surgical Hx, Family Hx, Social Hx, medications and allergies.   ROS:  Review of Systems  Constitutional: Negative.   Gastrointestinal: Positive for abdominal pain, nausea and vomiting. Negative for abdominal distention, blood in stool, constipation and diarrhea.  Genitourinary: Negative.   Psychiatric/Behavioral: Negative for self-injury and suicidal ideas.       + anxiety    Physical Exam   Patient Vitals for the past 24 hrs:  BP Temp Temp src Pulse Resp SpO2  05/25/19 1331 122/61 - - - - -  05/25/19 1127 120/71 98 F (36.7 C) Oral (!) 112 18 100 %    Constitutional: Well-developed, well-nourished female in no acute distress.  Cardiovascular: normal rate & rhythm, no murmur Respiratory: normal effort, lung sounds clear throughout GI: Abd soft, non-tender, gravid appropriate for gestational age. Pos BS x 4 MS: Extremities nontender, no edema, normal ROM Neurologic: Alert and oriented x 4.  GU:   Dilation: Closed Effacement (%): Thick Cervical Position: Posterior Exam by:: Estanislado SpireE. Emmy Keng, NP  NST:  Baseline: 145 bpm, Variability: Good {> 6 bpm), Accelerations: Reactive and Decelerations: Absent   Labs: Results for orders placed or performed during the hospital encounter of 05/25/19 (from the past 24 hour(s))  Urinalysis, Routine w reflex microscopic     Status: Abnormal   Collection Time: 05/25/19 11:36 AM  Result Value Ref Range   Color, Urine YELLOW YELLOW   APPearance CLOUDY (A) CLEAR   Specific Gravity, Urine 1.019 1.005 - 1.030   pH 6.0 5.0 - 8.0   Glucose, UA NEGATIVE NEGATIVE mg/dL   Hgb urine  dipstick NEGATIVE NEGATIVE   Bilirubin Urine NEGATIVE NEGATIVE   Ketones, ur NEGATIVE NEGATIVE mg/dL   Protein, ur NEGATIVE NEGATIVE mg/dL   Nitrite NEGATIVE NEGATIVE   Leukocytes,Ua SMALL (A) NEGATIVE   RBC / HPF 0-5 0 - 5 RBC/hpf   WBC, UA 6-10 0 - 5 WBC/hpf   Bacteria, UA MANY (A) NONE SEEN   Squamous Epithelial / LPF >50 (H) 0 - 5   Mucus PRESENT   H&H     Status: Abnormal   Collection Time: 05/25/19 12:20 PM  Result Value Ref Range   Hemoglobin 11.3 (L) 12.0 - 15.0 g/dL   HCT 29.533.6 (  L) 36.0 - 46.0 %  Comprehensive metabolic panel     Status: Abnormal   Collection Time: 05/25/19 12:20 PM  Result Value Ref Range   Sodium 134 (L) 135 - 145 mmol/L   Potassium 3.8 3.5 - 5.1 mmol/L   Chloride 106 98 - 111 mmol/L   CO2 22 22 - 32 mmol/L   Glucose, Bld 77 70 - 99 mg/dL   BUN 5 (L) 6 - 20 mg/dL   Creatinine, Ser 0.52 0.44 - 1.00 mg/dL   Calcium 8.5 (L) 8.9 - 10.3 mg/dL   Total Protein 5.4 (L) 6.5 - 8.1 g/dL   Albumin 2.4 (L) 3.5 - 5.0 g/dL   AST 8 (L) 15 - 41 U/L   ALT 12 0 - 44 U/L   Alkaline Phosphatase 63 38 - 126 U/L   Total Bilirubin 0.5 0.3 - 1.2 mg/dL   GFR calc non Af Amer >60 >60 mL/min   GFR calc Af Amer >60 >60 mL/min   Anion gap 6 5 - 15  Lipase, blood     Status: None   Collection Time: 05/25/19 12:20 PM  Result Value Ref Range   Lipase 27 11 - 51 U/L    Imaging:  No results found.  MAU Course: Orders Placed This Encounter  Procedures  . Urinalysis, Routine w reflex microscopic  . H&H  . Comprehensive metabolic panel  . Lipase, blood  . Discharge patient   Meds ordered this encounter  Medications  . pantoprazole (PROTONIX) EC tablet 40 mg    MDM: Reactive NST. Irregular ctx on monitor. Cervix closed/thick.   Had long discussion regarding symptoms being related to PUD vs anxiety. Will restart omeprazole. Labs reassuring today.  Encouraged patient to limit caffeine intake and decrease smoking as that can contribute to her symptoms. Also recommend  seeing a counselor during this stressful time to try and avoid medication adjustments. She has an appointment in the office tomorrow morning; encouraged her to discuss her concerns with ob/gyn.  If GI symptoms persist, may need to return to gastroenterologist.   Given pantoprazole 40 mg in MAU while labs pending. Patient appears well & reports being ready for discharge.   Assessment: 1. Heartburn during pregnancy in third trimester   2. Anxiety   3. [redacted] weeks gestation of pregnancy     Plan: Discharge home in stable condition.  Discussed reasons to return to MAU Take omeprazole as previously prescribed Keep ob appt tomorrow  Follow-up Information    [facility] Follow up.   Specialty: Obstetrics and Gynecology Why: return for worsening symptoms Contact information: [address] [id number] [place] [phone]          Allergies as of 05/25/2019      Reactions   Codeine Nausea And Vomiting, Other (See Comments)   Vertigo   Penicillins Rash, Other (See Comments)   Unknown:  Childhood reaction.  Has patient had a PCN reaction causing immediate rash, facial/tongue/throat swelling, SOB or lightheadedness with hypotension: Unknown Has patient had a PCN reaction causing severe rash involving mucus membranes or skin necrosis: Unknown Has patient had a PCN reaction that required hospitalization No Has patient had a PCN reaction occurring within the last 10 years: No If all of the above answers are "NO", then may pro   Sulfamethoxazole-trimethoprim Hives      Medication List    TAKE these medications   acetaminophen 500 MG tablet Commonly known as: TYLENOL Take 1,000 mg  by mouth every 6 (six) hours as needed for moderate pain.   albuterol 108 (90 Base) MCG/ACT inhaler Commonly known as: VENTOLIN HFA Inhale 1-2 puffs into the lungs every 6 (six) hours as needed for wheezing or shortness of breath.   ALPRAZolam 0.5  MG tablet Commonly known as: XANAX 1 three time daily for anxiety   aspirin EC 81 MG tablet Take 162 mg by mouth daily.   Blood Pressure Monitor Misc For regular home bp monitoring during pregnancy   butalbital-acetaminophen-caffeine 50-325-40 MG tablet Commonly known as: FIORICET Take 1 tablet by mouth every 6 (six) hours as needed for headache.   nortriptyline 25 MG capsule Commonly known as: PAMELOR Take 1 capsule (25 mg total) by mouth at bedtime. APPOINTMENT NEEDED FOR FURTHER REFILLS CALL [phone]   prenatal vitamin w/FE, FA 27-1 MG Tabs tablet Take 1 tablet by mouth daily at 12 noon.       Judeth Horn, NP 05/25/2019 2:49 PM

## 2019-05-25 NOTE — MAU Note (Signed)
Pt presents to MAU with c/o ongoing vomiting x 3 weeks, denies diarrhea and fever. Pt has also had abdominal cramping x 1 week. She has been able to tolerate solids and liquids. She has concerns about stomach ulcer.

## 2019-05-25 NOTE — Discharge Instructions (Signed)
Heartburn During Pregnancy Heartburn is a type of pain or discomfort that can happen in the throat or chest. It is often described as a burning sensation. Heartburn is common during pregnancy because:  A hormone (progesterone) that is released during pregnancy may relax the valve (lower esophageal sphincter, or LES) that separates the esophagus from the stomach. This allows stomach acid to move up into the esophagus, causing heartburn.  The uterus gets larger and pushes up on the stomach, which pushes more acid into the esophagus. This is especially true in the later stages of pregnancy. Heartburn usually goes away or gets better after giving birth. What are the causes? Heartburn is caused by stomach acid backing up into the esophagus (reflux). Reflux can be triggered by:  Changing hormone levels.  Large meals.  Certain foods and beverages, such as coffee, chocolate, onions, and peppermint.  Exercise.  Increased stomach acid production. What increases the risk? You are more likely to experience heartburn during pregnancy if you:  Had heartburn prior to becoming pregnant.  Have been pregnant more than once before.  Are overweight or obese. The likelihood that you will get heartburn also increases as you get farther along in your pregnancy, especially during the last trimester. What are the signs or symptoms? Symptoms of this condition include:  Burning pain in the chest or lower throat.  Bitter taste in the mouth.  Coughing.  Problems swallowing.  Vomiting.  Hoarse voice.  Asthma. Symptoms may get worse when you lie down or bend over. Symptoms are often worse at night. How is this diagnosed? This condition is diagnosed based on:  Your medical history.  Your symptoms.  Blood tests to check for a certain type of bacteria associated with heartburn.  Whether taking heartburn medicine relieves your symptoms.  Examination of the stomach and esophagus using a tube  with a light and camera on the end (endoscopy). How is this treated? Treatment varies depending on how severe your symptoms are. Your health care provider may recommend:  Over-the-counter medicines (antacids or acid reducers) for mild heartburn.  Prescription medicines to decrease stomach acid or to protect your stomach lining.  Certain changes in your diet.  Raising the head of your bed so it is higher than the foot of the bed. This helps prevent stomach acid from backing up into the esophagus when you are lying down. Follow these instructions at home: Eating and drinking  Do not drink alcohol during your pregnancy.  Identify foods and beverages that make your symptoms worse, and avoid them.  Beverages that you may want to avoid include: ? Coffee and tea (with or without caffeine). ? Energy drinks and sports drinks. ? Carbonated drinks or sodas. ? Citrus fruit juices.  Foods that you may want to avoid include: ? Chocolate and cocoa. ? Peppermint and mint flavorings. ? Garlic, onions, and horseradish. ? Spicy and acidic foods, including peppers, chili powder, curry powder, vinegar, hot sauces, and barbecue sauce. ? Citrus fruits, such as oranges, lemons, and limes. ? Tomato-based foods, such as red sauce, chili, and salsa. ? Fried and fatty foods, such as donuts, french fries, potato chips, and high-fat dressings. ? High-fat meats, such as hot dogs, cold cuts, sausage, ham, and bacon. ? High-fat dairy items, such as whole milk, butter, and cheese.  Eat small, frequent meals instead of large meals.  Avoid drinking large amounts of liquid with your meals.  Avoid eating meals during the 2-3 hours before bedtime.  Avoid lying down  right after you eat.  Do not exercise right after you eat. Medicines  Take over-the-counter and prescription medicines only as told by your health care provider.  Do not take aspirin, ibuprofen, or other NSAIDs unless your health care provider  tells you to do that.  You may be instructed to avoid medicines that contain sodium bicarbonate. General instructions   If directed, raise the head of your bed about 6 inches (15 cm) by putting blocks under the legs. Sleeping with more pillows does not effectively relieve heartburn because it only changes the position of your head.  Do not use any products that contain nicotine or tobacco, such as cigarettes and e-cigarettes. If you need help quitting, ask your health care provider.  Wear loose-fitting clothing.  Try to reduce your stress, such as with yoga or meditation. If you need help managing stress, ask your health care provider.  Maintain a healthy weight. If you are overweight, work with your health care provider to safely lose weight.  Keep all follow-up visits as told by your health care provider. This is important. Contact a health care provider if:  You develop new symptoms.  Your symptoms do not improve with treatment.  You have unexplained weight loss.  You have difficulty swallowing.  You make loud sounds when you breathe (wheeze).  You have a cough that does not go away.  You have frequent heartburn for more than 2 weeks.  You have nausea or vomiting that does not get better with treatment.  You have pain in your abdomen. Get help right away if:  You have severe chest pain that spreads to your arm, neck, or jaw.  You feel sweaty, dizzy, or light-headed.  You have shortness of breath.  You have pain when swallowing.  You vomit, and your vomit looks like blood or coffee grounds.  Your stool is bloody or black. This information is not intended to replace advice given to you by your health care provider. Make sure you discuss any questions you have with your health care provider. Document Released: 06/15/2000 Document Revised: 09/16/2017 Document Reviewed: 03/05/2016 Elsevier Patient Education  2020 [employer]     Perinatal Anxiety When a woman  feels excessive tension or worry (anxiety) during pregnancy or during the first 12 months after she gives birth, she has a condition called perinatal anxiety. Anxiety can interfere with work, school, relationships, and other everyday activities. If it is not managed properly, it can also cause problems in the mother and her baby.  If you are pregnant and you have symptoms of an anxiety disorder, it is important to talk with your health care provider. What are the causes? The exact cause of this condition is not known. Hormonal changes during and after pregnancy may play a role in causing perinatal anxiety. What increases the risk? You are more likely to develop this condition if:  You have a personal or family history of depression, anxiety, or mood disorders.  You experience a stressful life event during pregnancy, such as the death of a loved one.  You have a lot of regular life stress, such as being a single parent.  You have thyroid problems. What are the signs or symptoms? Perinatal anxiety can be different for everyone. It may include:  Panic attacks (panic disorder). These are intense episodes of fear or discomfort that may also cause sweating, nausea, shortness of breath, or fear of dying. They usually last 5-15 minutes.  Reliving an upsetting (traumatic) event through distressing  thoughts, dreams, or flashbacks (post-traumatic stress disorder, or PTSD).  Excessive worry about multiple problems (generalized anxiety disorder).  Fear and stress about leaving certain people or loved ones (separation anxiety).  Performing repetitive tasks (compulsions) to relieve stress or worry (obsessive compulsive disorder, or OCD).  Fear of certain objects or situations (phobias).  Excessive worrying, such as a constant feeling that something bad is going to happen.  Inability to relax.  Difficulty concentrating.  Sleep problems.  Frequent nightmares or disturbing thoughts. How is this  diagnosed? This condition is diagnosed based on a physical exam and mental evaluation. In some cases, your health care provider may use an anxiety screening tool. These tools include a list of questions that can help a health care provider diagnose anxiety. Your health care provider may refer you to a mental health expert who specializes in anxiety. How is this treated? This condition may be treated with:  Medicines. Your health care provider will only give you medicines that have been proven safe for pregnancy and breastfeeding.  Talk therapy with a mental health professional to help change your patterns of thinking (cognitive behavioral therapy).  Mindfulness-based stress reduction.  Other relaxation therapies, such as deep breathing or guided muscle relaxation.  Support groups. Follow these instructions at home: Lifestyle  Do not use any products that contain nicotine or tobacco, such as cigarettes and e-cigarettes. If you need help quitting, ask your health care provider.  Do not use alcohol when you are pregnant. After your baby is born, limit alcohol intake to no more than 1 drink a day. One drink equals 12 oz of beer, 5 oz of wine, or 1 oz of hard liquor.  Consider joining a support group for new mothers. Ask your health care provider for recommendations.  Take good care of yourself. Make sure you: ? Get plenty of sleep. If you are having trouble sleeping, talk with your health care provider. ? Eat a healthy diet. This includes plenty of fruits and vegetables, whole grains, and lean proteins. ? Exercise regularly, as told by your health care provider. Ask your health care provider what exercises are safe for you. General instructions  Take over-the-counter and prescription medicines only as told by your health care provider.  Talk with your partner or family members about your feelings during pregnancy. Share any concerns or fears that you may have.  Ask for help with tasks  or chores when you need it. Ask friends and family members to provide meals, watch your children, or help with cleaning.  Keep all follow-up visits as told by your health care provider. This is important. Contact a health care provider if:  You (or people close to you) notice that you have any symptoms of anxiety or depression.  You have anxiety and your symptoms get worse.  You experience side effects from medicines, such as nausea or sleep problems. Get help right away if:  You feel like hurting yourself, your baby, or someone else. If you ever feel like you may hurt yourself or others, or have thoughts about taking your own life, get help right away. You can go to your nearest emergency department or call:  Your local emergency services (911 in the [place]  A suicide crisis helpline, such as the National Suicide Prevention Lifeline at 4785921243. This is open 24 hours a day. Summary  Perinatal anxiety is when a woman feels excessive tension or worry during pregnancy or during the first 12 months after she gives birth.  Perinatal  anxiety may include panic attacks, post-traumatic stress disorder, separation anxiety, phobias, or generalized anxiety.  Perinatal anxiety can cause physical health problems in the mother and baby if not properly managed.  This condition is treated with medicines, talk therapy, stress reduction therapies, or a combination of two or more treatments.  Talk with your partner or family members about your concerns or fears. Do not be afraid to ask for help. This information is not intended to replace advice given to you by your health care provider. Make sure you discuss any questions you have with your health care provider. Document Released: 08/15/2016 Document Revised: 06/21/2017 Document Reviewed: 08/15/2016 Elsevier Patient Education  2020 [employer].      Fetal Movement Counts Patient Name: ________________________________________________  Patient Due Date: ____________________ What is a fetal movement count?  A fetal movement count is the number of times that you feel your baby move during a certain amount of time. This may also be called a fetal kick count. A fetal movement count is recommended for every pregnant woman. You may be asked to start counting fetal movements as early as week 28 of your pregnancy. Pay attention to when your baby is most active. You may notice your baby's sleep and wake cycles. You may also notice things that make your baby move more. You should do a fetal movement count:  When your baby is normally most active.  At the same time each day. A good time to count movements is while you are resting, after having something to eat and drink. How do I count fetal movements? 1. Find a quiet, comfortable area. Sit, or lie down on your side. 2. Write down the date, the start time and stop time, and the number of movements that you felt between those two times. Take this information with you to your health care visits. 3. For 2 hours, count kicks, flutters, swishes, rolls, and jabs. You should feel at least 10 movements during 2 hours. 4. You may stop counting after you have felt 10 movements. 5. If you do not feel 10 movements in 2 hours, have something to eat and drink. Then, keep resting and counting for 1 hour. If you feel at least 4 movements during that hour, you may stop counting. Contact a health care provider if:  You feel fewer than 4 movements in 2 hours.  Your baby is not moving like he or she usually does. Date: ____________ Start time: ____________ Stop time: ____________ Movements: ____________ Date: ____________ Start time: ____________ Stop time: ____________ Movements: ____________ Date: ____________ Start time: ____________ Stop time: ____________ Movements: ____________ Date: ____________ Start time: ____________ Stop time: ____________ Movements: ____________ Date: ____________ Start  time: ____________ Stop time: ____________ Movements: ____________ Date: ____________ Start time: ____________ Stop time: ____________ Movements: ____________ Date: ____________ Start time: ____________ Stop time: ____________ Movements: ____________ Date: ____________ Start time: ____________ Stop time: ____________ Movements: ____________ Date: ____________ Start time: ____________ Stop time: ____________ Movements: ____________ This information is not intended to replace advice given to you by your health care provider. Make sure you discuss any questions you have with your health care provider. Document Released: 07/18/2006 Document Revised: 07/08/2018 Document Reviewed: 07/28/2015 Elsevier Patient Education  2020 [employer].

## 2019-05-26 ENCOUNTER — Ambulatory Visit (INDEPENDENT_AMBULATORY_CARE_PROVIDER_SITE_OTHER): Payer: Medicaid Other | Admitting: Obstetrics and Gynecology

## 2019-05-26 ENCOUNTER — Encounter: Payer: Self-pay | Admitting: Obstetrics and Gynecology

## 2019-05-26 VITALS — BP 131/77 | HR 106 | Wt 182.4 lb

## 2019-05-26 DIAGNOSIS — Z1389 Encounter for screening for other disorder: Secondary | ICD-10-CM

## 2019-05-26 DIAGNOSIS — Z3493 Encounter for supervision of normal pregnancy, unspecified, third trimester: Secondary | ICD-10-CM

## 2019-05-26 DIAGNOSIS — Z3A32 32 weeks gestation of pregnancy: Secondary | ICD-10-CM

## 2019-05-26 DIAGNOSIS — Z331 Pregnant state, incidental: Secondary | ICD-10-CM

## 2019-05-26 LAB — POCT URINALYSIS DIPSTICK OB
Blood, UA: NEGATIVE
Glucose, UA: NEGATIVE
Ketones, UA: NEGATIVE
Leukocytes, UA: NEGATIVE
Nitrite, UA: NEGATIVE
POC,PROTEIN,UA: NEGATIVE

## 2019-05-26 NOTE — Progress Notes (Signed)
Patient ID: Meagan [name], female   DOB: 1991-04-06, 28 y.o.   MRN: [id number]    LOW-RISK PREGNANCY VISIT Patient name: Meagan [name] MRN [id number]  Date of birth: 21-Jan-1991 Chief Complaint:   Routine Prenatal Visit  History of Present Illness:   Meagan [name] is a 28 y.o. [id number] female at [redacted]w[redacted]d with an Estimated Date of Delivery: 07/18/19 being seen today for ongoing management of a low-risk pregnancy. TOLAC consent form given to take home and sign. Is considering something more and doesn't know much about IUD. Was considering Depo but is concerned of weight gain.  Today she reports no complaints. Contractions: Irregular.  .  Movement: Present. denies leaking of fluid. Review of Systems:   Pertinent items are noted in HPI Denies abnormal vaginal discharge w/ itching/odor/irritation, headaches, visual changes, shortness of breath, chest pain, abdominal pain, severe nausea/vomiting, or problems with urination or bowel movements unless otherwise stated above. Pertinent History Reviewed:  Reviewed past medical,surgical, social, obstetrical and family history.  Reviewed problem list, medications and allergies. Physical Assessment:   Vitals:   05/26/19 0956  BP: 131/77  Pulse: (!) 106  Weight: 182 lb 6.4 oz (82.7 kg)  Body mass index is 32.31 kg/m.        Physical Examination:   General appearance: Well appearing, and in no distress  Mental status: Alert, oriented to person, place, and time  Skin: Warm & dry  Cardiovascular: Normal heart rate noted  Respiratory: Normal respiratory effort, no distress  Abdomen: Soft, gravid, nontender  Pelvic: Cervical exam deferred         Extremities: Edema: None  Fetal Status: Fetal Heart Rate (bpm): 147 Fundal Height: 32 cm Movement: Present    Chaperone: Samul Dada    Results for orders placed or performed in visit on 05/26/19 (from the past 24 hour(s))  POC Urinalysis Dipstick OB   Collection Time: 05/26/19  9:55 AM   Result Value Ref Range   Color, UA     Clarity, UA     Glucose, UA Negative Negative   Bilirubin, UA     Ketones, UA neg    Spec Grav, UA     Blood, UA neg    pH, UA     POC,PROTEIN,UA Negative Negative, Trace, Small (1+), Moderate (2+), Large (3+), 4+   Urobilinogen, UA     Nitrite, UA neg    Leukocytes, UA Negative Negative   Appearance     Odor    Results for orders placed or performed during the hospital encounter of 05/25/19 (from the past 24 hour(s))  Urinalysis, Routine w reflex microscopic   Collection Time: 05/25/19 11:36 AM  Result Value Ref Range   Color, Urine YELLOW YELLOW   APPearance CLOUDY (A) CLEAR   Specific Gravity, Urine 1.019 1.005 - 1.030   pH 6.0 5.0 - 8.0   Glucose, UA NEGATIVE NEGATIVE mg/dL   Hgb urine dipstick NEGATIVE NEGATIVE   Bilirubin Urine NEGATIVE NEGATIVE   Ketones, ur NEGATIVE NEGATIVE mg/dL   Protein, ur NEGATIVE NEGATIVE mg/dL   Nitrite NEGATIVE NEGATIVE   Leukocytes,Ua SMALL (A) NEGATIVE   RBC / HPF 0-5 0 - 5 RBC/hpf   WBC, UA 6-10 0 - 5 WBC/hpf   Bacteria, UA MANY (A) NONE SEEN   Squamous Epithelial / LPF >50 (H) 0 - 5   Mucus PRESENT   H&H   Collection Time: 05/25/19 12:20 PM  Result Value Ref Range   Hemoglobin 11.3 (L) 12.0 - 15.0  g/dL   HCT 81.2 (L) 75.1 - 70.0 %  Comprehensive metabolic panel   Collection Time: 05/25/19 12:20 PM  Result Value Ref Range   Sodium 134 (L) 135 - 145 mmol/L   Potassium 3.8 3.5 - 5.1 mmol/L   Chloride 106 98 - 111 mmol/L   CO2 22 22 - 32 mmol/L   Glucose, Bld 77 70 - 99 mg/dL   BUN 5 (L) 6 - 20 mg/dL   Creatinine, Ser 1.74 0.44 - 1.00 mg/dL   Calcium 8.5 (L) 8.9 - 10.3 mg/dL   Total Protein 5.4 (L) 6.5 - 8.1 g/dL   Albumin 2.4 (L) 3.5 - 5.0 g/dL   AST 8 (L) 15 - 41 U/L   ALT 12 0 - 44 U/L   Alkaline Phosphatase 63 38 - 126 U/L   Total Bilirubin 0.5 0.3 - 1.2 mg/dL   GFR calc non Af Amer >60 >60 mL/min   GFR calc Af Amer >60 >60 mL/min   Anion gap 6 5 - 15  Lipase, blood   Collection  Time: 05/25/19 12:20 PM  Result Value Ref Range   Lipase 27 11 - 51 U/L    Assessment & Plan:  1) Low-risk pregnancy [id number] at [redacted]w[redacted]d with an Estimated Date of Delivery: 07/18/19   2) TOLAC consent form given, and signed   Meds: No orders of the defined types were placed in this encounter.  Labs/procedures today: none  Plan:  Continue routine obstetrical care  Next visit: prefers in person    Reviewed: Preterm labor symptoms and general obstetric precautions including but not limited to vaginal bleeding, contractions, leaking of fluid and fetal movement were reviewed in detail with the patient.  All questions were answered. Has home bp cuff.   Follow-up: Return in about 2 weeks (around 06/09/2019).  Orders Placed This Encounter  Procedures  . POC Urinalysis Dipstick OB   By signing my name below, I, Arnette Norris, attest that this documentation has been prepared under the direction and in the presence of Tilda Burrow, MD. Electronically Signed: Arnette Norris Medical Scribe. 05/26/19. 10:18 AM.  I personally performed the services described in this documentation, which was SCRIBED in my presence. The recorded information has been reviewed and considered accurate. It has been edited as necessary during review. Tilda Burrow, MD

## 2019-06-08 ENCOUNTER — Encounter: Payer: Self-pay | Admitting: *Deleted

## 2019-06-10 ENCOUNTER — Ambulatory Visit (INDEPENDENT_AMBULATORY_CARE_PROVIDER_SITE_OTHER): Payer: Medicaid Other | Admitting: Advanced Practice Midwife

## 2019-06-10 ENCOUNTER — Encounter: Payer: Self-pay | Admitting: Advanced Practice Midwife

## 2019-06-10 ENCOUNTER — Other Ambulatory Visit: Payer: Self-pay

## 2019-06-10 VITALS — BP 120/74 | HR 91 | Wt 186.0 lb

## 2019-06-10 DIAGNOSIS — Z3A34 34 weeks gestation of pregnancy: Secondary | ICD-10-CM

## 2019-06-10 DIAGNOSIS — Z3483 Encounter for supervision of other normal pregnancy, third trimester: Secondary | ICD-10-CM

## 2019-06-10 DIAGNOSIS — Z331 Pregnant state, incidental: Secondary | ICD-10-CM

## 2019-06-10 DIAGNOSIS — Z98891 History of uterine scar from previous surgery: Secondary | ICD-10-CM

## 2019-06-10 DIAGNOSIS — Z1389 Encounter for screening for other disorder: Secondary | ICD-10-CM

## 2019-06-10 LAB — POCT URINALYSIS DIPSTICK OB
Blood, UA: NEGATIVE
Glucose, UA: NEGATIVE
Ketones, UA: NEGATIVE
Nitrite, UA: NEGATIVE
POC,PROTEIN,UA: NEGATIVE

## 2019-06-10 NOTE — Progress Notes (Signed)
   LOW-RISK PREGNANCY VISIT Patient name: Meagan [name] MRN [id number]  Date of birth: Jan 13, 1991 Chief Complaint:   Routine Prenatal Visit  History of Present Illness:   Meagan [name] is a 28 y.o. [id number] female at [redacted]w[redacted]d with an Estimated Date of Delivery: 07/18/19 being seen today for ongoing management of a low-risk pregnancy.  Today she reports vag pressure. Contractions: Irregular. Vag. Bleeding: None.  Movement: Present. denies leaking of fluid. Review of Systems:   Pertinent items are noted in HPI Denies abnormal vaginal discharge w/ itching/odor/irritation, headaches, visual changes, shortness of breath, chest pain, abdominal pain, severe nausea/vomiting, or problems with urination or bowel movements unless otherwise stated above. Pertinent History Reviewed:  Reviewed past medical,surgical, social, obstetrical and family history.  Reviewed problem list, medications and allergies. Physical Assessment:   Vitals:   06/10/19 1103  BP: 120/74  Pulse: 91  Weight: 186 lb (84.4 kg)  Body mass index is 32.95 kg/m.        Physical Examination:   General appearance: Well appearing, and in no distress  Mental status: Alert, oriented to person, place, and time  Skin: Warm & dry  Cardiovascular: Normal heart rate noted  Respiratory: Normal respiratory effort, no distress  Abdomen: Soft, gravid, nontender  Pelvic: Cervical exam deferred         Extremities: Edema: None  Fetal Status: Fetal Heart Rate (bpm): 152 Fundal Height: 35 cm Movement: Present    Results for orders placed or performed in visit on 06/10/19 (from the past 24 hour(s))  POC Urinalysis Dipstick OB   Collection Time: 06/10/19 11:05 AM  Result Value Ref Range   Color, UA     Clarity, UA     Glucose, UA Negative Negative   Bilirubin, UA     Ketones, UA neg    Spec Grav, UA     Blood, UA neg    pH, UA     POC,PROTEIN,UA Negative Negative, Trace, Small (1+), Moderate (2+), Large (3+), 4+   Urobilinogen, UA     Nitrite, UA neg    Leukocytes, UA Trace (A) Negative   Appearance     Odor      Assessment & Plan:  1) Low-risk pregnancy [id number] at [redacted]w[redacted]d with an Estimated Date of Delivery: 07/18/19   2) Prev C/S, plans TOLAC, consent signed last visit   3) Anxiety, Xanax bid-tid  4) Smoker  5) Hemiplegic migrane  6) Hx pre-e x 2, nl baseline labs, BPs nl, continue ASA  7) Rh neg, has received Rhogam   Meds: No orders of the defined types were placed in this encounter.  Labs/procedures today: none  Plan:  Continue routine obstetrical care with GBS/GC/chlam at next visit  Reviewed: Preterm labor symptoms and general obstetric precautions including but not limited to vaginal bleeding, contractions, leaking of fluid and fetal movement were reviewed in detail with the patient.  All questions were answered. Has home bp cuff. Check bp weekly, let us know if >140/90.   Follow-up: Return in about 2 weeks (around 06/24/2019) for in person, LROB.  Orders Placed This Encounter  Procedures  . POC Urinalysis Dipstick OB   Myrtis Ser [facility] 06/10/2019 11:25 AM

## 2019-06-10 NOTE — Patient Instructions (Signed)
Meagan [name], I greatly value your feedback.  If you receive a survey following your visit with [name] today, we appreciate you taking the time to fill it out.  Thanks, Meagan [name], [place]!!! It is now [name] at [name] ([place], [name] [address]) Entrance located off of [address] parking    Go to [email or web address] to register for FREE online childbirth classes   Call the office [phone]) or go to [facility] if:  You begin to have strong, frequent contractions  Your water breaks.  Sometimes it is a big gush of fluid, sometimes it is just a trickle that keeps getting your panties wet or running down your legs  You have vaginal bleeding.  It is normal to have a small amount of spotting if your cervix was checked.   You don't feel your baby moving like normal.  If you don't, get you something to eat and drink and lay down and focus on feeling your baby move.  You should feel at least 10 movements in 2 hours.  If you don't, you should call the office or go to [facility].    Tdap Vaccine  It is recommended that you get the Tdap vaccine during the third trimester of EACH pregnancy to help protect your baby from getting pertussis (whooping cough)  27-36 weeks is the BEST time to do this so that you can pass the protection on to your baby. During pregnancy is better than after pregnancy, but if you are unable to get it during pregnancy it will be offered at the hospital.   You can get this vaccine with [name], at the health department, your family doctor, or some local pharmacies  Everyone who will be around your baby should also be up-to-date on their vaccines before the baby comes. Adults (who are not pregnant) only need 1 dose of Tdap during adulthood.   [name]:  [name] [phone]                  [name] [phone] (usually not accepting new patients unless you have family there already, you are always welcome to call and ask)       [name] [phone]       [name] Pediatricians/Family Doctors:   [name]: [phone]  [name]: [phone]  [name]: [place] Doctors:   [name]: [name]: [name]:  [name]: [phone]   Home Blood Pressure Monitoring for Patients   Your provider has recommended that you check your blood pressure (BP) at least once a week at home. If you do not have a blood pressure cuff at home, one will be provided for you. Contact your provider if you have not received your monitor within 1 week.   Helpful Tips for Accurate Home Blood Pressure Checks  . Don't smoke, exercise, or drink caffeine 30 minutes before checking your BP . Use the restroom before checking your BP (a full bladder can raise your pressure) . Relax in a comfortable upright chair . Feet on the ground . Left arm resting comfortably on a flat surface at the level of your heart . Legs uncrossed . Back supported . Sit quietly and don't talk .  Place the cuff on your bare arm . Adjust snuggly, so that only two fingertips can fit between your skin and the top of the cuff . Check 2 readings separated by at least one minute . Keep a log of your BP readings . For a visual, please reference this diagram: http://ccnc.care/bpdiagram  Provider Name: [name]     Phone: [phone]  Zone 1: ALL CLEAR  Continue to monitor your symptoms:  . BP reading is less than 140 (top number) or less than 90 (bottom number)  . No right upper stomach pain . No headaches or seeing spots . No feeling nauseated or throwing up . No swelling in face and  hands  Zone 2: CAUTION Call your doctor's office for any of the following:  . BP reading is greater than 140 (top number) or greater than 90 (bottom number)  . Stomach pain under your ribs in the middle or right side . Headaches or seeing spots . Feeling nauseated or throwing up . Swelling in face and hands  Zone 3: EMERGENCY  Seek immediate medical care if you have any of the following:  . BP reading is greater than160 (top number) or greater than 110 (bottom number) . Severe headaches not improving with Tylenol . Serious difficulty catching your breath . Any worsening symptoms from Zone 2   Third Trimester of Pregnancy The third trimester is from week 29 through week 42, months 7 through 9. The third trimester is a time when the fetus is growing rapidly. At the end of the ninth month, the fetus is about 20 inches in length and weighs 6-10 pounds.  BODY CHANGES Your body goes through many changes during pregnancy. The changes vary from woman to woman.   Your weight will continue to increase. You can expect to gain 25-35 pounds (11-16 kg) by the end of the pregnancy.  You may begin to get stretch marks on your hips, abdomen, and breasts.  You may urinate more often because the fetus is moving lower into your pelvis and pressing on your bladder.  You may develop or continue to have heartburn as a result of your pregnancy.  You may develop constipation because certain hormones are causing the muscles that push waste through your intestines to slow down.  You may develop hemorrhoids or swollen, bulging veins (varicose veins).  You may have pelvic pain because of the weight gain and pregnancy hormones relaxing your joints between the bones in your pelvis. Backaches may result from overexertion of the muscles supporting your posture.  You may have changes in your hair. These can include thickening of your hair, rapid growth, and changes in texture. Some women also have hair loss during  or after pregnancy, or hair that feels dry or thin. Your hair will most likely return to normal after your baby is born.  Your breasts will continue to grow and be tender. A yellow discharge may leak from your breasts called colostrum.  Your belly button may stick out.  You may feel short of breath because of your expanding uterus.  You may notice the fetus "dropping," or moving lower in your abdomen.  You may have a bloody mucus discharge. This usually occurs a few days to a week before labor begins.  Your cervix becomes thin and soft (effaced) near your due date. WHAT TO EXPECT AT YOUR PRENATAL EXAMS  You will have prenatal exams every 2 weeks until week 36. Then, you will have weekly prenatal exams. During  a routine prenatal visit:  You will be weighed to make sure you and the fetus are growing normally.  Your blood pressure is taken.  Your abdomen will be measured to track your baby's growth.  The fetal heartbeat will be listened to.  Any test results from the previous visit will be discussed.  You may have a cervical check near your due date to see if you have effaced. At around 36 weeks, your caregiver will check your cervix. At the same time, your caregiver will also perform a test on the secretions of the vaginal tissue. This test is to determine if a type of bacteria, Group B streptococcus, is present. Your caregiver will explain this further. Your caregiver may ask you:  What your birth plan is.  How you are feeling.  If you are feeling the baby move.  If you have had any abnormal symptoms, such as leaking fluid, bleeding, severe headaches, or abdominal cramping.  If you have any questions. Other tests or screenings that may be performed during your third trimester include:  Blood tests that check for low iron levels (anemia).  Fetal testing to check the health, activity level, and growth of the fetus. Testing is done if you have certain medical conditions or if  there are problems during the pregnancy. FALSE LABOR You may feel small, irregular contractions that eventually go away. These are called Braxton Hicks contractions, or false labor. Contractions may last for hours, days, or even weeks before true labor sets in. If contractions come at regular intervals, intensify, or become painful, it is best to be seen by your caregiver.  SIGNS OF LABOR   Menstrual-like cramps.  Contractions that are 5 minutes apart or less.  Contractions that start on the top of the uterus and spread down to the lower abdomen and back.  A sense of increased pelvic pressure or back pain.  A watery or bloody mucus discharge that comes from the vagina. If you have any of these signs before the 37th week of pregnancy, call your caregiver right away. You need to go to the hospital to get checked immediately. HOME CARE INSTRUCTIONS   Avoid all smoking, herbs, alcohol, and unprescribed drugs. These chemicals affect the formation and growth of the baby.  Follow your caregiver's instructions regarding medicine use. There are medicines that are either safe or unsafe to take during pregnancy.  Exercise only as directed by your caregiver. Experiencing uterine cramps is a good sign to stop exercising.  Continue to eat regular, healthy meals.  Wear a good support bra for breast tenderness.  Do not use hot tubs, steam rooms, or saunas.  Wear your seat belt at all times when driving.  Avoid raw meat, uncooked cheese, cat litter boxes, and soil used by cats. These carry germs that can cause birth defects in the baby.  Take your prenatal vitamins.  Try taking a stool softener (if your caregiver approves) if you develop constipation. Eat more high-fiber foods, such as fresh vegetables or fruit and whole grains. Drink plenty of fluids to keep your urine clear or pale yellow.  Take warm sitz baths to soothe any pain or discomfort caused by hemorrhoids. Use hemorrhoid cream if your  caregiver approves.  If you develop varicose veins, wear support hose. Elevate your feet for 15 minutes, 3-4 times a day. Limit salt in your diet.  Avoid heavy lifting, wear low heal shoes, and practice good posture.  Rest a lot with your legs elevated if you  have leg cramps or low back pain.  Visit your dentist if you have not gone during your pregnancy. Use a soft toothbrush to brush your teeth and be gentle when you floss.  A sexual relationship may be continued unless your caregiver directs you otherwise.  Do not travel far distances unless it is absolutely necessary and only with the approval of your caregiver.  Take prenatal classes to understand, practice, and ask questions about the labor and delivery.  Make a trial run to the hospital.  Pack your hospital bag.  Prepare the baby's nursery.  Continue to go to all your prenatal visits as directed by your caregiver. SEEK MEDICAL CARE IF:  You are unsure if you are in labor or if your water has broken.  You have dizziness.  You have mild pelvic cramps, pelvic pressure, or nagging pain in your abdominal area.  You have persistent nausea, vomiting, or diarrhea.  You have a bad smelling vaginal discharge.  You have pain with urination. SEEK IMMEDIATE MEDICAL CARE IF:   You have a fever.  You are leaking fluid from your vagina.  You have spotting or bleeding from your vagina.  You have severe abdominal cramping or pain.  You have rapid weight loss or gain.  You have shortness of breath with chest pain.  You notice sudden or extreme swelling of your face, hands, ankles, feet, or legs.  You have not felt your baby move in over an hour.  You have severe headaches that do not go away with medicine.  You have vision changes. Document Released: 06/12/2001 Document Revised: 06/23/2013 Document Reviewed: 08/19/2012 [facility] Patient Information 2015 [place], [place]. This information is not intended to replace advice  given to you by your health care provider. Make sure you discuss any questions you have with your health care provider.  PROTECT YOURSELF & YOUR BABY FROM THE FLU! Because you are pregnant, we at [facility], along with the Centers for Disease Control (CDC), recommend that you receive the flu vaccine to protect yourself and your baby from the flu. The flu is more likely to cause severe illness in pregnant women than in women of reproductive age who are not pregnant. Changes in the immune system, heart, and lungs during pregnancy make pregnant women (and women up to two weeks postpartum) more prone to severe illness from flu, including illness resulting in hospitalization. Flu also may be harmful for a pregnant woman's developing baby. A common flu symptom is fever, which may be associated with neural tube defects and other adverse outcomes for a developing baby. Getting vaccinated can also help protect a baby after birth from flu. (Mom passes antibodies onto the developing baby during her pregnancy.)  A Flu Vaccine is the Best Protection Against Flu Getting a flu vaccine is the first and most important step in protecting against flu. Pregnant women should get a flu shot and not the live attenuated influenza vaccine (LAIV), also known as nasal spray flu vaccine. Flu vaccines given during pregnancy help protect both the mother and her baby from flu. Vaccination has been shown to reduce the risk of flu-associated acute respiratory infection in pregnant women by up to one-half. A 2018 study showed that getting a flu shot reduced a pregnant woman's risk of being hospitalized with flu by an average of 40 percent. Pregnant women who get a flu vaccine are also helping to protect their babies from flu illness for the first several months after their birth, when they are too  young to get vaccinated.   A Long Record of Safety for Flu Shots in Pregnant Women Flu shots have been given to millions of pregnant women over  many years with a good safety record. There is a lot of evidence that flu vaccines can be given safely during pregnancy; though these data are limited for the first trimester. The CDC recommends that pregnant women get vaccinated during any trimester of their pregnancy. It is very important for pregnant women to get the flu shot.   Other Preventive Actions In addition to getting a flu shot, pregnant women should take the same everyday preventive actions the CDC recommends of everyone, including covering coughs, washing hands often, and avoiding people who are sick.  Symptoms and Treatment If you get sick with flu symptoms call your doctor right away. There are antiviral drugs that can treat flu illness and prevent serious flu complications. The CDC recommends prompt treatment for people who have influenza infection or suspected influenza infection and who are at high risk of serious flu complications, such as people with asthma, diabetes (including gestational diabetes), or heart disease. Early treatment of influenza in hospitalized pregnant women has been shown to reduce the length of the hospital stay.  Symptoms Flu symptoms include fever, cough, sore throat, runny or stuffy nose, body aches, headache, chills and fatigue. Some people may also have vomiting and diarrhea. People may be infected with the flu and have respiratory symptoms without a fever.  Early Treatment is Important for Pregnant Women Treatment should begin as soon as possible because antiviral drugs work best when started early (within 48 hours after symptoms start). Antiviral drugs can make your flu illness milder and make you feel better faster. They may also prevent serious health problems that can result from flu illness. Oral oseltamivir (Tamiflu) is the preferred treatment for pregnant women because it has the most studies available to suggest that it is safe and beneficial. Antiviral drugs require a prescription from your  provider. Having a fever caused by flu infection or other infections early in pregnancy may be linked to birth defects in a baby. In addition to taking antiviral drugs, pregnant women who get a fever should treat their fever with Tylenol (acetaminophen) and contact their provider immediately.  When to [place] If you are pregnant and have any of these signs, seek care immediately:  Difficulty breathing or shortness of breath  Pain or pressure in the chest or abdomen  Sudden dizziness  Confusion  Severe or persistent vomiting  High fever that is not responding to Tylenol (or store brand equivalent)  Decreased or no movement of your baby  [email or web address]

## 2019-06-11 ENCOUNTER — Telehealth: Payer: Self-pay | Admitting: Obstetrics & Gynecology

## 2019-06-11 ENCOUNTER — Encounter (HOSPITAL_COMMUNITY): Payer: Self-pay | Admitting: Family Medicine

## 2019-06-11 ENCOUNTER — Other Ambulatory Visit: Payer: Self-pay

## 2019-06-11 ENCOUNTER — Inpatient Hospital Stay (HOSPITAL_COMMUNITY)
Admission: AD | Admit: 2019-06-11 | Discharge: 2019-06-11 | Disposition: A | Discharge status: Home / Self Care | Payer: Medicaid Other | Attending: Obstetrics and Gynecology | Admitting: Obstetrics and Gynecology

## 2019-06-11 DIAGNOSIS — O99513 Diseases of the respiratory system complicating pregnancy, third trimester: Secondary | ICD-10-CM | POA: Diagnosis not present

## 2019-06-11 DIAGNOSIS — Z3A34 34 weeks gestation of pregnancy: Secondary | ICD-10-CM | POA: Diagnosis not present

## 2019-06-11 DIAGNOSIS — F419 Anxiety disorder, unspecified: Secondary | ICD-10-CM | POA: Insufficient documentation

## 2019-06-11 DIAGNOSIS — O99333 Smoking (tobacco) complicating pregnancy, third trimester: Secondary | ICD-10-CM | POA: Diagnosis not present

## 2019-06-11 DIAGNOSIS — Z79899 Other long term (current) drug therapy: Secondary | ICD-10-CM | POA: Insufficient documentation

## 2019-06-11 DIAGNOSIS — F17210 Nicotine dependence, cigarettes, uncomplicated: Secondary | ICD-10-CM | POA: Diagnosis not present

## 2019-06-11 DIAGNOSIS — O23593 Infection of other part of genital tract in pregnancy, third trimester: Secondary | ICD-10-CM | POA: Insufficient documentation

## 2019-06-11 DIAGNOSIS — B9689 Other specified bacterial agents as the cause of diseases classified elsewhere: Secondary | ICD-10-CM | POA: Insufficient documentation

## 2019-06-11 DIAGNOSIS — Z8249 Family history of ischemic heart disease and other diseases of the circulatory system: Secondary | ICD-10-CM | POA: Insufficient documentation

## 2019-06-11 DIAGNOSIS — O4703 False labor before 37 completed weeks of gestation, third trimester: Secondary | ICD-10-CM

## 2019-06-11 DIAGNOSIS — O99343 Other mental disorders complicating pregnancy, third trimester: Secondary | ICD-10-CM | POA: Insufficient documentation

## 2019-06-11 DIAGNOSIS — Z7982 Long term (current) use of aspirin: Secondary | ICD-10-CM | POA: Insufficient documentation

## 2019-06-11 DIAGNOSIS — Z3483 Encounter for supervision of other normal pregnancy, third trimester: Secondary | ICD-10-CM

## 2019-06-11 DIAGNOSIS — N760 Acute vaginitis: Secondary | ICD-10-CM

## 2019-06-11 DIAGNOSIS — Z3689 Encounter for other specified antenatal screening: Secondary | ICD-10-CM

## 2019-06-11 DIAGNOSIS — J45909 Unspecified asthma, uncomplicated: Secondary | ICD-10-CM | POA: Insufficient documentation

## 2019-06-11 LAB — URINALYSIS, ROUTINE W REFLEX MICROSCOPIC
Bilirubin Urine: NEGATIVE
Glucose, UA: NEGATIVE mg/dL
Hgb urine dipstick: NEGATIVE
Ketones, ur: NEGATIVE mg/dL
Nitrite: NEGATIVE
Protein, ur: NEGATIVE mg/dL
Specific Gravity, Urine: 1.005 (ref 1.005–1.030)
pH: 8 (ref 5.0–8.0)

## 2019-06-11 LAB — WET PREP, GENITAL
Sperm: NONE SEEN
Trich, Wet Prep: NONE SEEN
Yeast Wet Prep HPF POC: NONE SEEN

## 2019-06-11 MED ORDER — METRONIDAZOLE 0.75 % VA GEL: 1.0000 | Freq: Every day | VAGINAL | 0 refills | Status: DC

## 2019-06-11 MED ORDER — NIFEDIPINE 10 MG PO CAPS
10.0000 mg | ORAL_CAPSULE | ORAL | Status: DC | PRN
  Administered 2019-06-11 (×2): 10 mg via ORAL
  Filled 2019-06-11 (×2): qty 1

## 2019-06-11 MED ORDER — ACETAMINOPHEN 325 MG PO TABS
650.0000 mg | ORAL_TABLET | Freq: Once | ORAL | Status: AC
  Administered 2019-06-11: 650 mg via ORAL
  Filled 2019-06-11: qty 2

## 2019-06-11 MED ORDER — LACTATED RINGERS IV BOLUS
1000.0000 mL | Freq: Once | INTRAVENOUS | Status: AC
  Administered 2019-06-11: 1000 mL via INTRAVENOUS

## 2019-06-11 NOTE — Telephone Encounter (Signed)
Pt states after her visit she was feeling bad. Had nausea and vomiting. She had elevated BP and was having contractions and pressure. She went to the [facility]. She was monitored until 6 am. She was sent home. She has been having severe headaches since she got home and is seeing floaters. She had a bp of 140/90. She took half of a fioricet and it is not helping. Her most recent bp about 20 min ago 140/92. I discussed patient with Dr. Elonda Husky. He wants her to go to [facility] for pre eclampsia evaluation. Patient voiced understanding and agrees with plan. She will have someone drive her to hospital.

## 2019-06-11 NOTE — [facility] Note (Signed)
Blood pressure was 140/89 at home -checked because thought something was wrong, vomited 6 times since 5 pm.  Then on way here, felt pressure and contractions for the last 4 hours. No bleeding. No leaking. Baby moving well today.

## 2019-06-11 NOTE — MAU Provider Note (Addendum)
History     CSN: [id number]  Arrival date and time: 06/11/19 4742   First Provider Initiated Contact with Patient 06/11/19 0359      Chief Complaint  Patient presents with  . Contractions   Meagan [name] is a 28 y.o. [id number] at [redacted]w[redacted]d who receives care at [facility].  She presents today for Contractions.  She states she started to notice them around 5pm and is unsure of how often they are occurring.  However, she states she lives about 20 minutes away and had "about 4" on the drive to the hospital.  Patient further reports having "lots of pressure down there and things are really tight." She rates the contractions a 3-4/10 and reiterates that it is more pressure than pain. She endorses fetal movement and denies vaginal concerns including leaking, discharge, or bleeding.  She denies sexual activty in the last 3 days.      OB History    Gravida  5   Para  4   Term  3   Preterm  1   AB  0   Living  4     SAB  0   TAB  0   Ectopic  0   Multiple  0   Live Births  4        Obstetric Comments  G4=LTCS (not vertical) per op note        Past Medical History:  Diagnosis Date  . Abdominal cramps 12/06/2015  . Anxiety   . Asthma    inhaler at bedside  . Daily headache   . Diarrhea 06/04/2013  . Hemiplegic migraine    blurred vision, nausea, dizziness, numbness  . Kidney stones   . Mental disorder    huffed inhalants "affected brain"  . Pyelonephritis complicating pregnancy, antepartum, first trimester 11/05/2011  . Stomach ulcer     Past Surgical History:  Procedure Laterality Date  . ANKLE SURGERY Left 09/2016   plates & screws  . CESAREAN SECTION N/A 12/19/2017   Procedure: CESAREAN SECTION;  Surgeon: Lazaro Arms, MD;  Location: [facility] BIRTHING SUITES;  Service: Obstetrics;  Laterality: N/A;    Family History  Problem Relation Age of Onset  . Cancer Maternal Grandmother        breast  . Cancer Paternal Grandfather        lung cancer  . Migraines  Father   . Hypertension Father   . Hypercholesterolemia Father   . Other Son        ventricular defect  . Hypertension Other     Social History   Tobacco Use  . Smoking status: Current Every Day Smoker    Packs/day: 1.50    Years: 7.00    Pack years: 10.50    Types: Cigarettes  . Smokeless tobacco: Never Used  Substance Use Topics  . Alcohol use: Not Currently    Alcohol/week: 0.0 standard drinks    Comment: occ; not now  . Drug use: Not Currently    Types: Other-see comments, Marijuana    Comment: huffed inhalants in past, "affected Brain"    Allergies:  Allergies  Allergen Reactions  . Codeine Nausea And Vomiting and Other (See Comments)    Vertigo  . Penicillins Rash and Other (See Comments)    Unknown:  Childhood reaction.  Has patient had a PCN reaction causing immediate rash, facial/tongue/throat swelling, SOB or lightheadedness with hypotension: Unknown Has patient had a PCN reaction causing severe rash involving mucus membranes or skin necrosis:  Unknown Has patient had a PCN reaction that required hospitalization No Has patient had a PCN reaction occurring within the last 10 years: No If all of the above answers are "NO", then may pro  . Sulfamethoxazole-Trimethoprim Hives    Medications Prior to Admission  Medication Sig Dispense Refill Last Dose  . acetaminophen (TYLENOL) 500 MG tablet Take 1,000 mg by mouth every 6 (six) hours as needed for moderate pain.   06/10/2019 at Unknown time  . albuterol (VENTOLIN HFA) 108 (90 Base) MCG/ACT inhaler Inhale 1-2 puffs into the lungs every 6 (six) hours as needed for wheezing or shortness of breath. 8 g 2 Past Month at Unknown time  . ALPRAZolam (XANAX) 0.5 MG tablet TAKE 1 TABLET BY MOUTH THREE TIMES DAILY AS NEEDED FOR SLEEP. 90 tablet 0 06/10/2019 at Unknown time  . aspirin EC 81 MG tablet Take 162 mg by mouth daily.   06/10/2019 at Unknown time  . Blood Pressure Monitor MISC For regular home bp monitoring during  pregnancy 1 each 0 06/11/2019 at Unknown time  . nortriptyline (PAMELOR) 25 MG capsule Take 1 capsule (25 mg total) by mouth at bedtime. APPOINTMENT NEEDED FOR FURTHER REFILLS CALL [phone] 30 capsule 11 06/10/2019 at Unknown time  . prenatal vitamin w/FE, FA (PRENATAL 1 + 1) 27-1 MG TABS tablet Take 1 tablet by mouth daily at 12 noon. 30 each 12 06/10/2019 at Unknown time  . butalbital-acetaminophen-caffeine (FIORICET) 50-325-40 MG tablet Take 1 tablet by mouth every 6 (six) hours as needed for headache. 10 tablet 0 More than a month at Unknown time    Review of Systems  Constitutional: Negative for chills and fever.  Respiratory: Positive for cough (D/t smoking). Negative for shortness of breath.   Gastrointestinal: Positive for nausea and vomiting. Negative for abdominal pain, constipation and diarrhea.  Genitourinary: Negative for difficulty urinating, dysuria, pelvic pain, vaginal bleeding and vaginal discharge.  Musculoskeletal: Negative for back pain.  Neurological: Positive for headaches (5-6/10). Negative for dizziness and light-headedness.   Physical Exam   Blood pressure 134/79, pulse (!) 107, temperature 98.2 F (36.8 C), temperature source Oral, resp. rate 16, last menstrual period 10/11/2018, SpO2 98 %, not currently breastfeeding.  Physical Exam  Constitutional: She is oriented to person, place, and time. She appears well-developed and well-nourished.  HENT:  Head: Normocephalic and atraumatic.  Eyes: Conjunctivae are normal. Pupils are unequal (Anisocoria-Right Eye Dilated, Left Normal Size).  Cardiovascular: Normal rate.  Respiratory: Effort normal.  GI: Soft.  Genitourinary: Cervix exhibits friability. Cervix exhibits no motion tenderness and no discharge.    Vaginal discharge present.     No vaginal bleeding.  No bleeding in the vagina.    Genitourinary Comments: Speculum Exam: -Normal External Genitalia: Non tender, no apparent discharge at introitus.  -Vaginal  Vault: Pink mucosa with good rugae. Moderate amt thin white discharge -wet prep collected -Cervix:Pink, no lesions, or polyps.  Appears closed. No active bleeding from os, but friable after yellowish mucoid discharge removed-GC/CT collected -Bimanual Exam:  1/Long/Ballotable   Musculoskeletal:        General: Normal range of motion.  Neurological: She is alert and oriented to person, place, and time.  Skin: Skin is warm and dry.  Psychiatric: She has a normal mood and affect. Her behavior is normal.    Fetal Assessment 145 bpm, Mod Var, -Decels, +Accels Toco: Q2-255min  MAU Course   Results for orders placed or performed during the hospital encounter of 06/11/19 (from the past 24 hour(s))  Urinalysis, Routine w reflex microscopic     Status: Abnormal   Collection Time: 06/11/19  3:18 AM  Result Value Ref Range   Color, Urine YELLOW YELLOW   APPearance CLOUDY (A) CLEAR   Specific Gravity, Urine 1.005 1.005 - 1.030   pH 8.0 5.0 - 8.0   Glucose, UA NEGATIVE NEGATIVE mg/dL   Hgb urine dipstick NEGATIVE NEGATIVE   Bilirubin Urine NEGATIVE NEGATIVE   Ketones, ur NEGATIVE NEGATIVE mg/dL   Protein, ur NEGATIVE NEGATIVE mg/dL   Nitrite NEGATIVE NEGATIVE   Leukocytes,Ua TRACE (A) NEGATIVE   RBC / HPF 6-10 0 - 5 RBC/hpf   WBC, UA 0-5 0 - 5 WBC/hpf   Bacteria, UA RARE (A) NONE SEEN   Squamous Epithelial / LPF 21-50 0 - 5   Mucus PRESENT   Wet prep, genital     Status: Abnormal   Collection Time: 06/11/19  4:17 AM   Specimen: PATH Cytology Cervicovaginal Ancillary Only  Result Value Ref Range   Yeast Wet Prep HPF POC NONE SEEN NONE SEEN   Trich, Wet Prep NONE SEEN NONE SEEN   Clue Cells Wet Prep HPF POC PRESENT (A) NONE SEEN   WBC, Wet Prep HPF POC MANY (A) NONE SEEN   Sperm NONE SEEN    No results found.  MDM PE Labs:Wet prep, GC/CT  EFM Start IV Procardia  Assessment and Plan  28 year old [id number]  SIUP at 34.5 weeks Cat I FT Contractions Vaginal Pressure  -Patient  offered and declines pain medication.  -Exam performed and findings discussed. -Cultures collected and pending.  -Will start IV and give LR Bolus. -Give Procardia per Protocol. -Will monitor and reassess.  Maryann Conners MSN, CNM 06/11/2019, 4:00 AM   Reassessment (5:33 AM) Bacterial Vaginosis Contractions-Subsided  -At bedside to discuss lab results and assess perception of contractions. -Patient asleep, but easily awakened. -Patient reports that contractions have subsided and questions if she had any while asleep.  Informed that one seen prior to provider arrival in room. -Patient requests discharge home. -Patient informed of bacterial vaginosis diagnosis and questions addressed regarding what it is and how it is treated. -Metrogel 0.75% PV QHS x 5days sent to pharmacy on file.  -Provider agreeable with discharge home with precautions. -Patient instructed to return to MAU if contractions return, vaginal bleeding, leaking of fluid, or decreased fm. -Patient verbalizes understanding and without questions or concerns. -Instructed to keep scheduled appt for Dec 17th for in office visit with GBS culture. -Discharged to home in stable condition.  Maryann Conners MSN, CNM Advanced Practice Provider, [facility] 5:50 AM  Nurse reports patient requesting tylenol for headache. Okay to give 650mg  now.  Maryann Conners MSN, CNM Advanced Practice Provider, [facility]

## 2019-06-11 NOTE — Discharge Instructions (Signed)
Bacterial Vaginosis  Bacterial vaginosis is a vaginal infection that occurs when the normal balance of bacteria in the vagina is disrupted. It results from an overgrowth of certain bacteria. This is the most common vaginal infection among women ages 15-44. Because bacterial vaginosis increases your risk for STIs (sexually transmitted infections), getting treated can help reduce your risk for chlamydia, gonorrhea, herpes, and HIV (human immunodeficiency virus). Treatment is also important for preventing complications in pregnant women, because this condition can cause an early (premature) delivery. What are the causes? This condition is caused by an increase in harmful bacteria that are normally present in small amounts in the vagina. However, the reason that the condition develops is not fully understood. What increases the risk? The following factors may make you more likely to develop this condition:  Having a new sexual partner or multiple sexual partners.  Having unprotected sex.  Douching.  Having an intrauterine device (IUD).  Smoking.  Drug and alcohol abuse.  Taking certain antibiotic medicines.  Being pregnant. You cannot get bacterial vaginosis from toilet seats, bedding, swimming pools, or contact with objects around you. What are the signs or symptoms? Symptoms of this condition include:  Grey or white vaginal discharge. The discharge can also be watery or foamy.  A fish-like odor with discharge, especially after sexual intercourse or during menstruation.  Itching in and around the vagina.  Burning or pain with urination. Some women with bacterial vaginosis have no signs or symptoms. How is this diagnosed? This condition is diagnosed based on:  Your medical history.  A physical exam of the vagina.  Testing a sample of vaginal fluid under a microscope to look for a large amount of bad bacteria or abnormal cells. Your health care provider may use a cotton swab or  a small wooden spatula to collect the sample. How is this treated? This condition is treated with antibiotics. These may be given as a pill, a vaginal cream, or a medicine that is put into the vagina (suppository). If the condition comes back after treatment, a second round of antibiotics may be needed. Follow these instructions at home: Medicines  Take over-the-counter and prescription medicines only as told by your health care provider.  Take or use your antibiotic as told by your health care provider. Do not stop taking or using the antibiotic even if you start to feel better. General instructions  If you have a female sexual partner, tell her that you have a vaginal infection. She should see her health care provider and be treated if she has symptoms. If you have a female sexual partner, he does not need treatment.  During treatment: ? Avoid sexual activity until you finish treatment. ? Do not douche. ? Avoid alcohol as directed by your health care provider. ? Avoid breastfeeding as directed by your health care provider.  Drink enough water and fluids to keep your urine clear or pale yellow.  Keep the area around your vagina and rectum clean. ? Wash the area daily with warm water. ? Wipe yourself from front to back after using the toilet.  Keep all follow-up visits as told by your health care provider. This is important. How is this prevented?  Do not douche.  Wash the outside of your vagina with warm water only.  Use protection when having sex. This includes latex condoms and dental dams.  Limit how many sexual partners you have. To help prevent bacterial vaginosis, it is best to have sex with just one partner (  monogamous).  Make sure you and your sexual partner are tested for STIs.  Wear cotton or cotton-lined underwear.  Avoid wearing tight pants and pantyhose, especially during summer.  Limit the amount of alcohol that you drink.  Do not use any products that contain  nicotine or tobacco, such as cigarettes and e-cigarettes. If you need help quitting, ask your health care provider.  Do not use illegal drugs. Where to find more information  Centers for Disease Control and Prevention: www.cdc.gov/std  American Sexual Health Association (ASHA): www.ashastd.org  U.S. Department of Health and Human Services, Office on Women's Health: www.womenshealth.gov/ or https://www.womenshealth.gov/a-z-topics/bacterial-vaginosis Contact a health care provider if:  Your symptoms do not improve, even after treatment.  You have more discharge or pain when urinating.  You have a fever.  You have pain in your abdomen.  You have pain during sex.  You have vaginal bleeding between periods. Summary  Bacterial vaginosis is a vaginal infection that occurs when the normal balance of bacteria in the vagina is disrupted.  Because bacterial vaginosis increases your risk for STIs (sexually transmitted infections), getting treated can help reduce your risk for chlamydia, gonorrhea, herpes, and HIV (human immunodeficiency virus). Treatment is also important for preventing complications in pregnant women, because the condition can cause an early (premature) delivery.  This condition is treated with antibiotic medicines. These may be given as a pill, a vaginal cream, or a medicine that is put into the vagina (suppository). This information is not intended to replace advice given to you by your health care provider. Make sure you discuss any questions you have with your health care provider. Document Released: 06/18/2005 Document Revised: 05/31/2017 Document Reviewed: 03/03/2016 Elsevier Patient Education  2020 Elsevier Inc.  

## 2019-06-11 NOTE — Telephone Encounter (Signed)
Patient called, stated she went to the ER last night for preterm labor.  She is having really bad headaches and Tylenol isn't helping.  Her bp around 2:44pm was 143/94 and at 2:51pm it was 138/87.  Please call patient.  [phone]

## 2019-06-12 LAB — GC/CHLAMYDIA PROBE AMP (~~LOC~~) NOT AT ARMC
Chlamydia: NEGATIVE
Comment: NEGATIVE
Comment: NORMAL
Neisseria Gonorrhea: NEGATIVE

## 2019-06-16 ENCOUNTER — Telehealth: Payer: Self-pay | Admitting: *Deleted

## 2019-06-16 ENCOUNTER — Ambulatory Visit (INDEPENDENT_AMBULATORY_CARE_PROVIDER_SITE_OTHER): Payer: Medicaid Other | Admitting: Obstetrics & Gynecology

## 2019-06-16 ENCOUNTER — Other Ambulatory Visit: Payer: Self-pay

## 2019-06-16 ENCOUNTER — Encounter: Payer: Self-pay | Admitting: Obstetrics & Gynecology

## 2019-06-16 VITALS — BP 128/76 | HR 102 | Wt 187.0 lb

## 2019-06-16 DIAGNOSIS — Z98891 History of uterine scar from previous surgery: Secondary | ICD-10-CM

## 2019-06-16 DIAGNOSIS — Z3483 Encounter for supervision of other normal pregnancy, third trimester: Secondary | ICD-10-CM

## 2019-06-16 DIAGNOSIS — Z3A35 35 weeks gestation of pregnancy: Secondary | ICD-10-CM

## 2019-06-16 NOTE — Progress Notes (Signed)
   LOW-RISK PREGNANCY VISIT Patient name: Meagan [name] MRN [id number]  Date of birth: 25-Sep-1990 Chief Complaint:   Contractions  History of Present Illness:   Meagan [name] is a 28 y.o. [id number] female at [redacted]w[redacted]d with an Estimated Date of Delivery: 07/18/19 being seen today for ongoing management of a low-risk pregnancy.  Today she reports irregular contractions and pelvic pressure. Contractions: Irregular. Vag. Bleeding: None.  Movement: Present. denies leaking of fluid. Review of Systems:   Pertinent items are noted in HPI Denies abnormal vaginal discharge w/ itching/odor/irritation, headaches, visual changes, shortness of breath, chest pain, abdominal pain, severe nausea/vomiting, or problems with urination or bowel movements unless otherwise stated above. Pertinent History Reviewed:  Reviewed past medical,surgical, social, obstetrical and family history.  Reviewed problem list, medications and allergies. Physical Assessment:   Vitals:   06/16/19 1602  BP: 128/76  Pulse: (!) 102  Weight: 187 lb (84.8 kg)  Body mass index is 33.13 kg/m.        Physical Examination:   General appearance: Well appearing, and in no distress  Mental status: Alert, oriented to person, place, and time  Skin: Warm & dry  Cardiovascular: Normal heart rate noted  Respiratory: Normal respiratory effort, no distress  Abdomen: Soft, gravid, nontender  Pelvic: Cervical exam performed  Dilation: 1 Effacement (%): 50 Station: -3  Extremities: Edema: None  Fetal Status: Fetal Heart Rate (bpm): 150 Fundal Height: 35 cm Movement: Present Presentation: Vertex  Chaperone: Amanda Rash    No results found for this or any previous visit (from the past 24 hour(s)).  Assessment & Plan:  1) Low-risk pregnancy [id number] at [redacted]w[redacted]d with an Estimated Date of Delivery: 07/18/19   2)Previous C section for TOLAC  3) Desires Postplacental IUD   Meds: No orders of the defined types were placed in this  encounter.  Labs/procedures today:   Plan:  Continue routine obstetrical care  Next visit: prefers in person    Reviewed: Preterm labor symptoms and general obstetric precautions including but not limited to vaginal bleeding, contractions, leaking of fluid and fetal movement were reviewed in detail with the patient.  All questions were answered. Has home bp cuff. Rx faxed to . Check bp weekly, let us know if >140/90.   Follow-up: Return for keep scheduled.  No orders of the defined types were placed in this encounter.  Florian Buff  06/16/2019 4:33 PM

## 2019-06-16 NOTE — Telephone Encounter (Signed)
Patient called with c/o increased contractions and pressure.   Feels like "something is opening up and happening down there".  Will w/i for cervical exam.

## 2019-06-23 ENCOUNTER — Encounter: Payer: Medicaid Other | Admitting: Advanced Practice Midwife

## 2019-06-24 ENCOUNTER — Other Ambulatory Visit: Payer: Self-pay | Admitting: *Deleted

## 2019-06-24 ENCOUNTER — Ambulatory Visit (INDEPENDENT_AMBULATORY_CARE_PROVIDER_SITE_OTHER): Payer: Medicaid Other | Admitting: Advanced Practice Midwife

## 2019-06-24 ENCOUNTER — Other Ambulatory Visit: Payer: Self-pay | Admitting: Obstetrics & Gynecology

## 2019-06-24 ENCOUNTER — Encounter: Payer: Self-pay | Admitting: Advanced Practice Midwife

## 2019-06-24 ENCOUNTER — Other Ambulatory Visit: Payer: Self-pay

## 2019-06-24 VITALS — BP 122/78 | HR 98 | Wt 190.0 lb

## 2019-06-24 DIAGNOSIS — Z3483 Encounter for supervision of other normal pregnancy, third trimester: Secondary | ICD-10-CM

## 2019-06-24 DIAGNOSIS — Z331 Pregnant state, incidental: Secondary | ICD-10-CM

## 2019-06-24 DIAGNOSIS — Z3A36 36 weeks gestation of pregnancy: Secondary | ICD-10-CM

## 2019-06-24 DIAGNOSIS — Z1389 Encounter for screening for other disorder: Secondary | ICD-10-CM

## 2019-06-24 LAB — POCT URINALYSIS DIPSTICK OB
Blood, UA: NEGATIVE
Glucose, UA: NEGATIVE
Ketones, UA: NEGATIVE
Leukocytes, UA: NEGATIVE
Nitrite, UA: NEGATIVE
POC,PROTEIN,UA: NEGATIVE

## 2019-06-24 MED ORDER — GUAIFENESIN ER 600 MG PO TB12: 600.0000 mg | ORAL_TABLET | Freq: Two times a day (BID) | ORAL | 2 refills | Status: DC

## 2019-06-24 MED ORDER — BENZONATATE 100 MG PO CAPS: 100.0000 mg | ORAL_CAPSULE | Freq: Three times a day (TID) | ORAL | 1 refills | Status: DC | PRN

## 2019-06-24 NOTE — Patient Instructions (Signed)
Braxton Hicks Contractions Contractions of the uterus can occur throughout pregnancy, but they are not always a sign that you are in labor. You may have practice contractions called Braxton Hicks contractions. These false labor contractions are sometimes confused with true labor. What are Braxton Hicks contractions? Braxton Hicks contractions are tightening movements that occur in the muscles of the uterus before labor. Unlike true labor contractions, these contractions do not result in opening (dilation) and thinning of the cervix. Toward the end of pregnancy (32-34 weeks), Braxton Hicks contractions can happen more often and may become stronger. These contractions are sometimes difficult to tell apart from true labor because they can be very uncomfortable. You should not feel embarrassed if you go to the hospital with false labor. Sometimes, the only way to tell if you are in true labor is for your health care provider to look for changes in the cervix. The health care provider will do a physical exam and may monitor your contractions. If you are not in true labor, the exam should show that your cervix is not dilating and your water has not broken. If there are no other health problems associated with your pregnancy, it is completely safe for you to be sent home with false labor. You may continue to have Braxton Hicks contractions until you go into true labor. How to tell the difference between true labor and false labor True labor  Contractions last 30-70 seconds.  Contractions become very regular.  Discomfort is usually felt in the top of the uterus, and it spreads to the lower abdomen and low back.  Contractions do not go away with walking.  Contractions usually become more intense and increase in frequency.  The cervix dilates and gets thinner. False labor  Contractions are usually shorter and not as strong as true labor contractions.  Contractions are usually irregular.  Contractions  are often felt in the front of the lower abdomen and in the groin.  Contractions may go away when you walk around or change positions while lying down.  Contractions get weaker and are shorter-lasting as time goes on.  The cervix usually does not dilate or become thin. Follow these instructions at home:   Take over-the-counter and prescription medicines only as told by your health care provider.  Keep up with your usual exercises and follow other instructions from your health care provider.  Eat and drink lightly if you think you are going into labor.  If Braxton Hicks contractions are making you uncomfortable: ? Change your position from lying down or resting to walking, or change from walking to resting. ? Sit and rest in a tub of warm water. ? Drink enough fluid to keep your urine pale yellow. Dehydration may cause these contractions. ? Do slow and deep breathing several times an hour.  Keep all follow-up prenatal visits as told by your health care provider. This is important. Contact a health care provider if:  You have a fever.  You have continuous pain in your abdomen. Get help right away if:  Your contractions become stronger, more regular, and closer together.  You have fluid leaking or gushing from your vagina.  You pass blood-tinged mucus (bloody show).  You have bleeding from your vagina.  You have low back pain that you never had before.  You feel your baby's head pushing down and causing pelvic pressure.  Your baby is not moving inside you as much as it used to. Summary  Contractions that occur before labor are   called Braxton Hicks contractions, false labor, or practice contractions.  Braxton Hicks contractions are usually shorter, weaker, farther apart, and less regular than true labor contractions. True labor contractions usually become progressively stronger and regular, and they become more frequent.  Manage discomfort from Braxton Hicks contractions  by changing position, resting in a warm bath, drinking plenty of water, or practicing deep breathing. This information is not intended to replace advice given to you by your health care provider. Make sure you discuss any questions you have with your health care provider. Document Released: 11/01/2016 Document Revised: 05/31/2017 Document Reviewed: 11/01/2016 Elsevier Patient Education  2020 Elsevier Inc.  

## 2019-06-24 NOTE — Addendum Note (Signed)
Addended by: Serita Grammes D on: 06/24/2019 02:38 PM   Modules accepted: Orders

## 2019-06-24 NOTE — Progress Notes (Signed)
LOW-RISK PREGNANCY VISIT Patient name: Meagan [name] MRN [id number]  Date of birth: October 28, 1990 Chief Complaint:   Routine Prenatal Visit  History of Present Illness:   Meagan [name] is a 28 y.o. [id number] female at [redacted]w[redacted]d with an Estimated Date of Delivery: 07/18/19 being seen today for ongoing management of a low-risk pregnancy.  Today she reports no complaints. Contractions: Irregular. Vag. Bleeding: None.  Movement: Present. denies leaking of fluid.  Requesting something for a persistent cough 2/2 smoking- will start with Mucinex and then maybe Tessalon Perles if needed, but she prefers something that won't cause drowsiness.  Review of Systems:   Pertinent items are noted in HPI Denies abnormal vaginal discharge w/ itching/odor/irritation, headaches, visual changes, shortness of breath, chest pain, abdominal pain, severe nausea/vomiting, or problems with urination or bowel movements unless otherwise stated above. Pertinent History Reviewed:  Reviewed past medical,surgical, social, obstetrical and family history.  Reviewed problem list, medications and allergies. Physical Assessment:   Vitals:   06/24/19 1018  BP: 122/78  Pulse: 98  Weight: 190 lb (86.2 kg)  Body mass index is 33.66 kg/m.        Physical Examination:   General appearance: Well appearing, and in no distress  Mental status: Alert, oriented to person, place, and time  Skin: Warm & dry  Cardiovascular: Normal heart rate noted  Respiratory: Normal respiratory effort, no distress  Abdomen: Soft, gravid, nontender  Pelvic: Cervical exam performed  Dilation: Fingertip Effacement (%): Thick Station: -3  Extremities: Edema: None  Fetal Status: Fetal Heart Rate (bpm): 142 Fundal Height: 36 cm Movement: Present Presentation: Vertex  Results for orders placed or performed in visit on 06/24/19 (from the past 24 hour(s))  POC Urinalysis Dipstick OB   Collection Time: 06/24/19 10:20 AM  Result Value Ref Range     Color, UA     Clarity, UA     Glucose, UA Negative Negative   Bilirubin, UA     Ketones, UA neg    Spec Grav, UA     Blood, UA neg    pH, UA     POC,PROTEIN,UA Negative Negative, Trace, Small (1+), Moderate (2+), Large (3+), 4+   Urobilinogen, UA     Nitrite, UA neg    Leukocytes, UA Negative Negative   Appearance     Odor      Assessment & Plan:  1) Low-risk pregnancy [id number] at [redacted]w[redacted]d with an Estimated Date of Delivery: 07/18/19   2) Prev C/S, for TOLAC  3) Planning on PP IUD  4) Anxiety, Xanax bid-tid  5) Smoker, will rx Mucinex for persistent cough  6) Hx pre-e x 2, continue ASA  7) Rh neg, has received Rhogam   Meds:  Meds ordered this encounter  Medications  . guaiFENesin (MUCINEX) 600 MG 12 hr tablet    Sig: Take 1 tablet (600 mg total) by mouth 2 (two) times daily.    Dispense:  30 tablet    Refill:  2    Order Specific Question:   Supervising Provider    Answer:   Jonnie Kind [[id number]]   Labs/procedures today: GBS with sensitivities (GC/chlam neg 12/10)  Plan:  Continue routine obstetrical care   Reviewed: Term labor symptoms and general obstetric precautions including but not limited to vaginal bleeding, contractions, leaking of fluid and fetal movement were reviewed in detail with the patient.  All questions were answered. Has home bp cuff. Check bp weekly, let us know if >140/90.   Follow-up:  Return in about 1 week (around 07/01/2019) for LROB, in person.  Orders Placed This Encounter  Procedures  . Strep Gp B NAA+Rflx(PCN Allergic)  . POC Urinalysis Dipstick OB   [name] [facility] 06/24/2019 11:09 AM

## 2019-06-26 LAB — STREP GP B NAA+RFLX: Strep Gp B NAA+Rflx: NEGATIVE

## 2019-06-27 IMAGING — US US RENAL
1 series · 15 of 25 positions shown · non-contrast
Comparison: None.

CLINICAL DATA: Left flank pain starting yesterday. History of
pyelonephritis. Current pregnancy.

EXAM:
RENAL / URINARY TRACT ULTRASOUND COMPLETE

[Series 1: us renal · 15 of 30 slices shown]
[im 1/30]
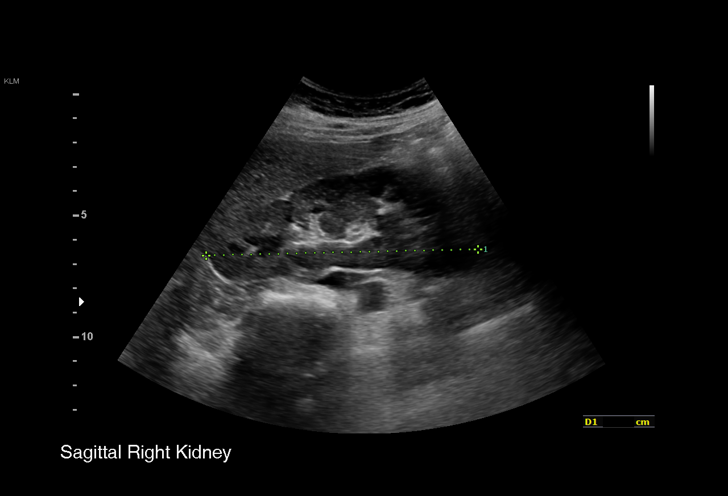
[im 3/30]
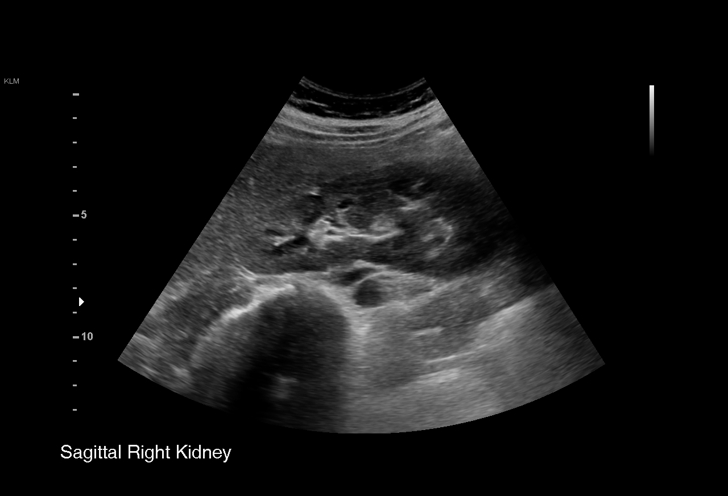
[im 5/30]
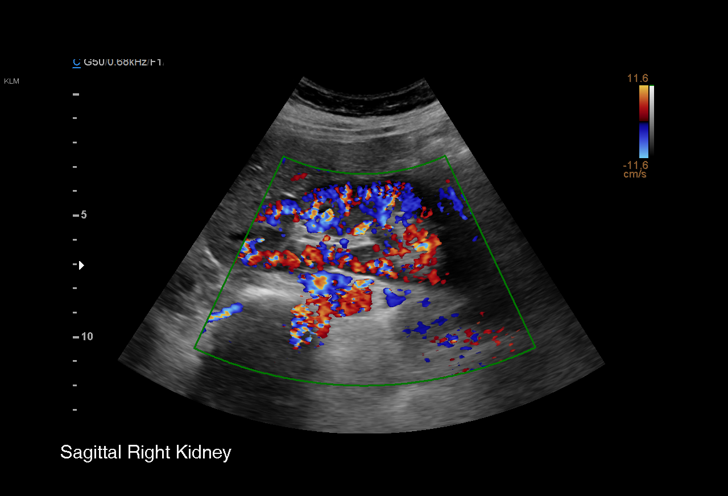
[im 7/30]
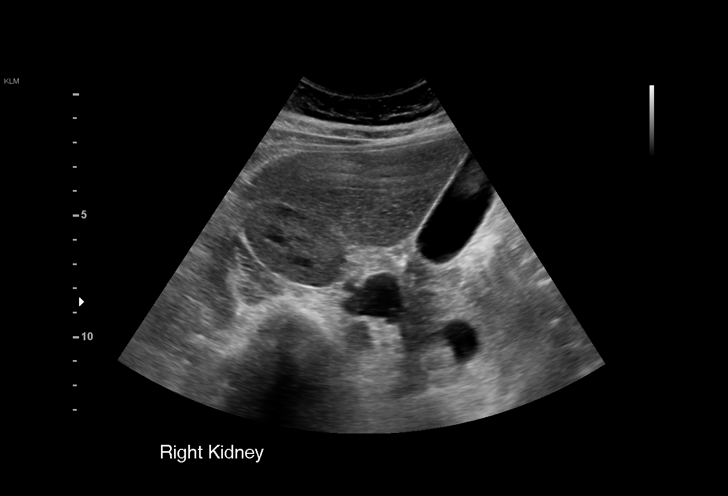
[im 9/30]
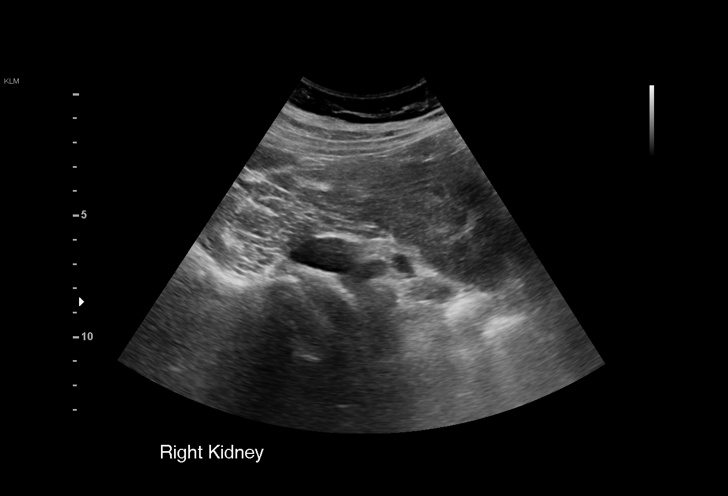
[im 11/30]
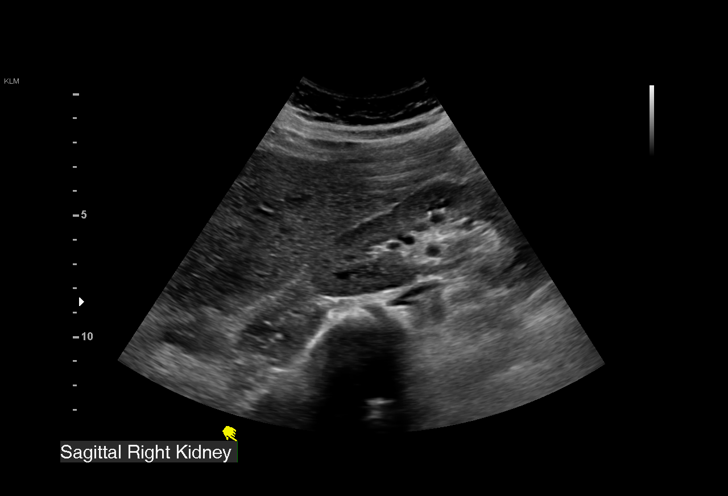
[im 13/30]
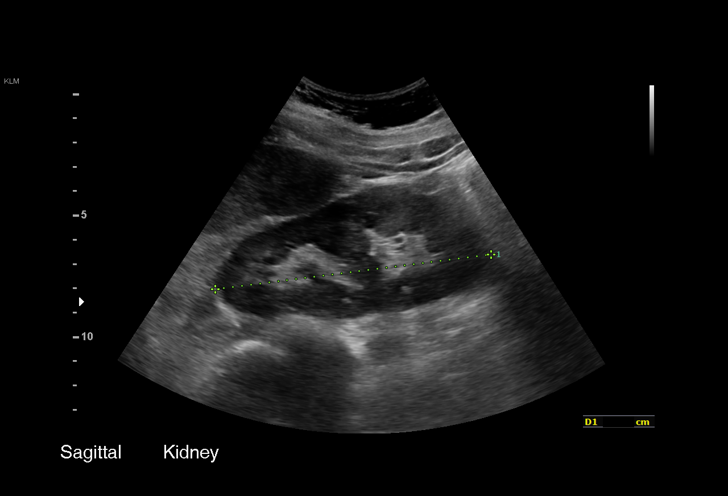
[im 15/30]
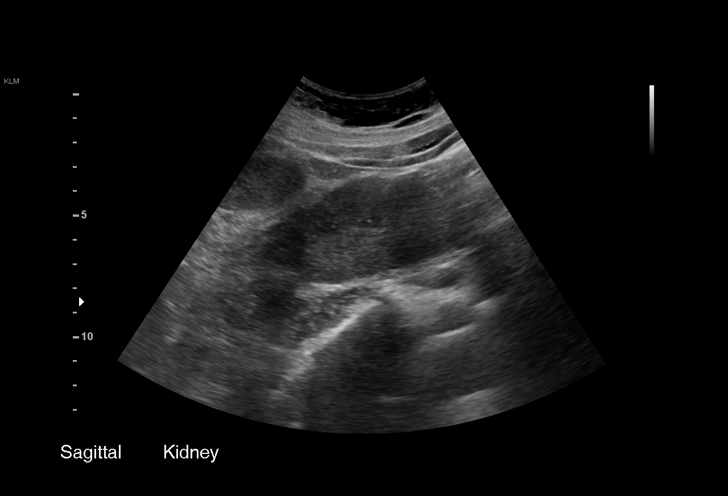
[im 17/30]
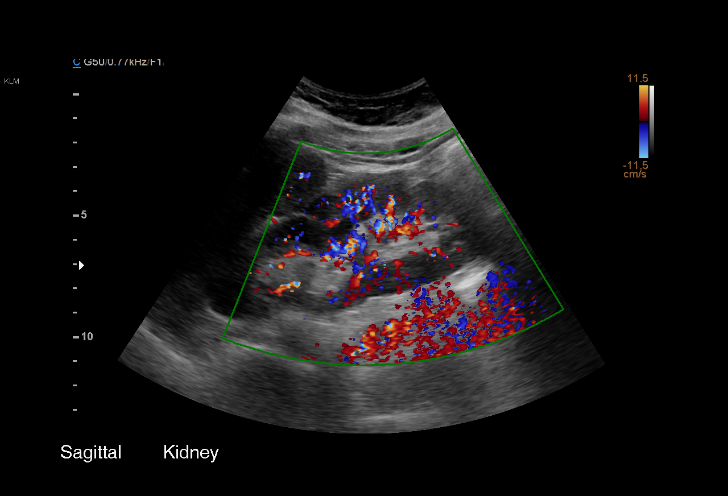
[im 19/30]
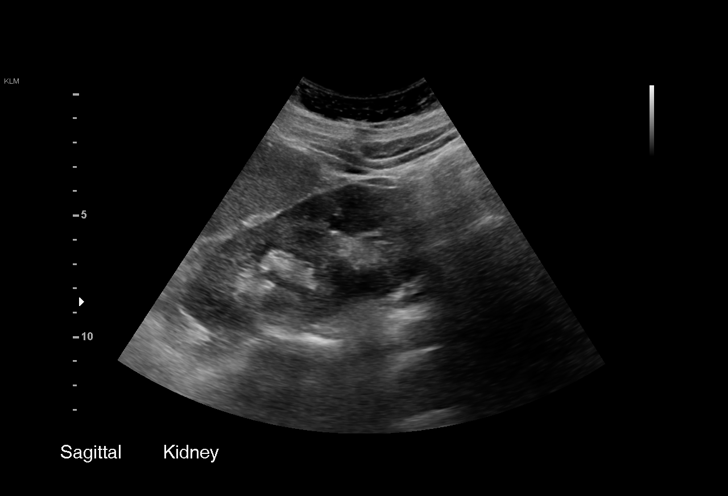
[im 21/30]
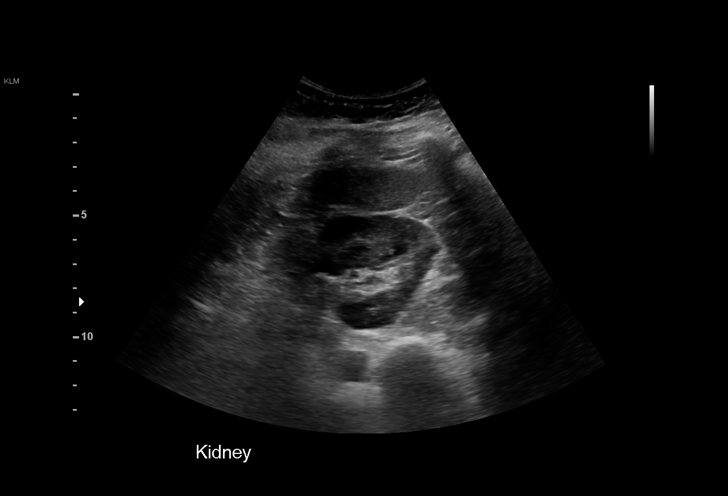
[im 23/30]
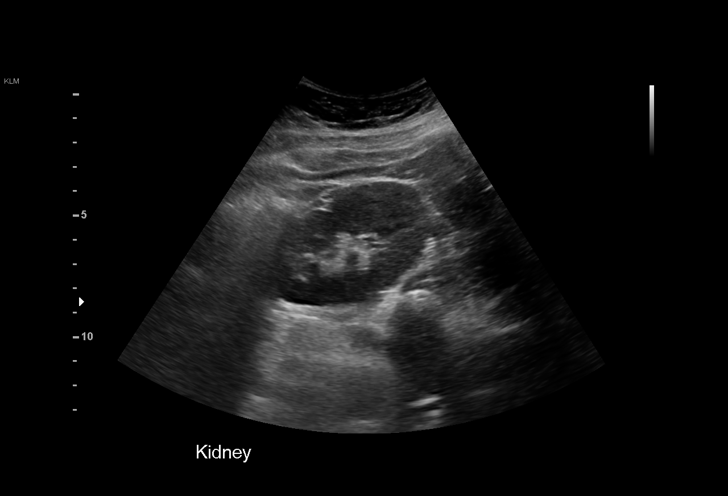
[im 25/30]
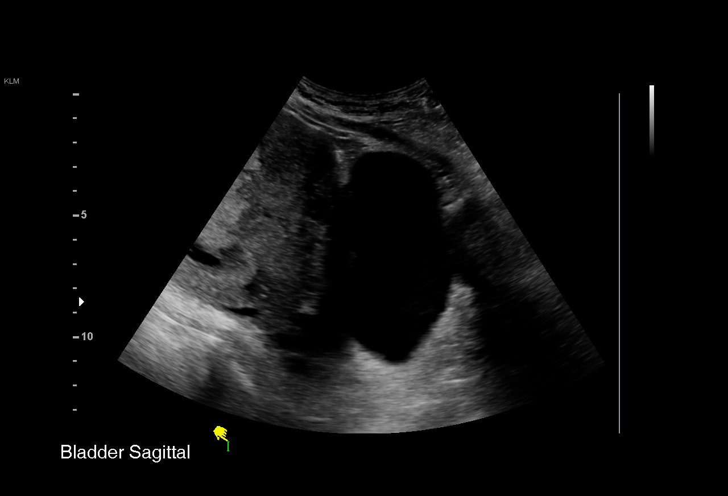
[im 27/30]
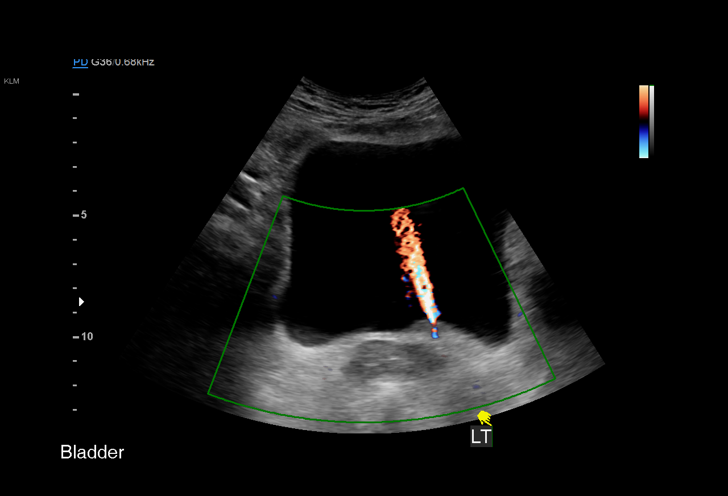
[im 30/30]
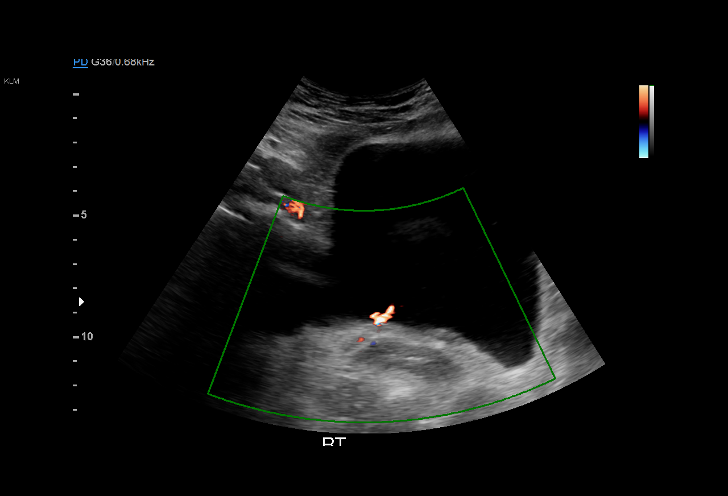

[15 of 25 positions shown; findings below may reference images not displayed]

FINDINGS: Right Kidney:

Length: 11.2 cm. Echogenicity within normal limits. No mass or
hydronephrosis visualized.

Left Kidney:

Length: 11.4 cm. Echogenicity within normal limits. No mass or
hydronephrosis visualized.

Bladder:

Appears normal for degree of bladder distention. Bilateral urine
flow jets are demonstrated with color flow Doppler imaging.
IMPRESSION: Normal ultrasound appearance of the kidneys. No evidence of
hydronephrosis.

## 2019-07-01 ENCOUNTER — Other Ambulatory Visit: Payer: Self-pay

## 2019-07-01 ENCOUNTER — Ambulatory Visit (INDEPENDENT_AMBULATORY_CARE_PROVIDER_SITE_OTHER): Payer: Medicaid Other | Admitting: Obstetrics and Gynecology

## 2019-07-01 ENCOUNTER — Encounter: Payer: Self-pay | Admitting: Obstetrics and Gynecology

## 2019-07-01 VITALS — BP 132/85 | HR 85 | Wt 193.4 lb

## 2019-07-01 DIAGNOSIS — Z1389 Encounter for screening for other disorder: Secondary | ICD-10-CM

## 2019-07-01 DIAGNOSIS — Z3483 Encounter for supervision of other normal pregnancy, third trimester: Secondary | ICD-10-CM

## 2019-07-01 DIAGNOSIS — Z3A37 37 weeks gestation of pregnancy: Secondary | ICD-10-CM

## 2019-07-01 DIAGNOSIS — Z331 Pregnant state, incidental: Secondary | ICD-10-CM

## 2019-07-01 LAB — POCT URINALYSIS DIPSTICK OB
Glucose, UA: NEGATIVE
Ketones, UA: NEGATIVE
Leukocytes, UA: NEGATIVE
Nitrite, UA: NEGATIVE
POC,PROTEIN,UA: NEGATIVE

## 2019-07-01 NOTE — Progress Notes (Signed)
**Note Meagan-Identified via Obfuscation** Patient ID: ANERI SLAGEL, female   DOB: 1991-02-26, 26 y.o.   MRN: [id number]   LOW-RISK PREGNANCY VISIT Patient name: Meagan [name] MRN [id number]  Date of birth: 05-Nov-1990 Chief Complaint:   Routine Prenatal Visit (swelling in fingers and ankles; seeing black spots; BP high @ home)  History of Present Illness:   Meagan [name] is a 28 y.o. [id number] female at [redacted]w[redacted]d with an Estimated Date of Delivery: 07/18/19 being seen today for ongoing management of a low-risk pregnancy prior cesarean x1 (abruption 33w) after prior vaginal deliveries..  Today she reports no complaints. Her previous c section was performed at 33 wks because of placental abruption. She would like an IUD after delivery. The highest her BP has been at home is 142/92.  Contractions: Irregular. Vag. Bleeding: None.  Movement: Present. denies leaking of fluid. Review of Systems:   Pertinent items are noted in HPI Denies abnormal vaginal discharge w/ itching/odor/irritation, headaches, visual changes, shortness of breath, chest pain, abdominal pain, severe nausea/vomiting, or problems with urination or bowel movements unless otherwise stated above. Pertinent History Reviewed:  Reviewed past medical,surgical, social, obstetrical and family history.  Reviewed problem list, medications and allergies. Physical Assessment:   Vitals:   07/01/19 1424  BP: 132/85  Pulse: 85  Weight: 193 lb 6.4 oz (87.7 kg)  Body mass index is 34.26 kg/m.        Physical Examination:   General appearance: Well appearing, and in no distress  Mental status: Alert, oriented to person, place, and time  Skin: Warm & dry  Cardiovascular: Normal heart rate noted  Respiratory: Normal respiratory effort, no distress  Abdomen: Soft, gravid, nontender  Pelvic: Cervical exam deferred         Extremities: Edema: Trace  Fetal Status:     Movement: Present    Results for orders placed or performed in visit on 07/01/19 (from the past 24  hour(s))  POC Urinalysis Dipstick OB   Collection Time: 07/01/19  2:24 PM  Result Value Ref Range   Color, UA     Clarity, UA     Glucose, UA Negative Negative   Bilirubin, UA     Ketones, UA neg    Spec Grav, UA     Blood, UA trace    pH, UA     POC,PROTEIN,UA Negative Negative, Trace, Small (1+), Moderate (2+), Large (3+), 4+   Urobilinogen, UA     Nitrite, UA neg    Leukocytes, UA Negative Negative   Appearance     Odor      Assessment & Plan:  1) Low-risk pregnancy [id number] at [redacted]w[redacted]d with an Estimated Date of Delivery: 07/18/19   2) Prev C/S, x 1 desires TOLAC  3) Planning on PP IUD  4) Anxiety, Xanax bid-tid  5) Smoker  6) Hx pre-e x 2, continue ASA  7) Rh neg, has received Rhogam   Plan:  Continue routine obstetrical care  Meds: No orders of the defined types were placed in this encounter.  Labs/procedures today: none  Reviewed: Term labor symptoms and general obstetric precautions including but not limited to vaginal bleeding, contractions, leaking of fluid and fetal movement were reviewed in detail with the patient.  All questions were answered  Follow-up: Return in about 1 week (around 07/08/2019) for [place].  Orders Placed This Encounter  Procedures  . POC Urinalysis Dipstick OB   By signing my name below, I, Meagan [name], attest that this documentation has been prepared under the direction  and in the presence of Tilda Burrow, MD. Electronically Signed: Pietro Cassis, Medical Scribe. 07/01/19. 2:48 PM.  I personally performed the services described in this documentation, which was SCRIBED in my presence. The recorded information has been reviewed and considered accurate. It has been edited as necessary during review. Tilda Burrow, MD

## 2019-07-03 NOTE — L&D Delivery Note (Signed)
OB/[facility]N Faculty Practice Delivery Note  Meagan [name] is a 29 y.o. [id number] s/p VBAC at [redacted]w[redacted]d. She was admitted for IOL for Pre-eclampsia with severe features by neuro symptoms (headache, scotoma).   ROM: 1h 84m with clear fluid GBS Status: --Theda Sers (12/23 1218) Maximum Maternal Temperature: 98.7 F    Labor Progress: . Patient arrived at 1.5 cm dilation and was induced with foley bulb, pitocin, and AROM.   Delivery Date/Time: 07/10/2019 at 2042 Delivery: Called to room and patient was having significant prolonged decelerations down to the 80's. Started to push and patient delivered after two pushes. Head delivered in LOA position. Mass of cord noted around baby's neck, unable to reduce so delivered through via somersault maneuver, subsequently reduced nuchal x7. Infant with weak cry and poor tone, attempted to wait until one minute but given infant's appearance cord was clamped x 2 after less than 1-minute and infant was taken to warmer and code APGAR called. Arterial and venous cord blood drawn, ABG notable for pH 7.09, pCO2 79, bicarb 23. Placenta delivered spontaneously with gentle cord traction. Fundus firm with massage and Pitocin. Labia, perineum, vagina, and cervix inspected with no lacerations. Infant subsequently improved after arrival to warmer and was taken for skin to skin.   After delivery of the placenta a post-placental Liletta IUD was placed, see separate note.   Placenta: 3v intact, to L&D Complications: nuchal x7 Lacerations: none EBL: 100 cc Analgesia: epidural   Infant: APGAR (1 MIN): 7   APGAR (5 MINS): 8    Weight: 3300 grams  Zack Seal, MD/[facility] OB/GYN Fellow, Faculty Practice

## 2019-07-08 ENCOUNTER — Ambulatory Visit: Payer: Medicaid Other | Attending: Internal Medicine

## 2019-07-08 ENCOUNTER — Other Ambulatory Visit: Payer: Self-pay

## 2019-07-08 DIAGNOSIS — Z20822 Contact with and (suspected) exposure to covid-19: Secondary | ICD-10-CM

## 2019-07-09 ENCOUNTER — Inpatient Hospital Stay (EMERGENCY_DEPARTMENT_HOSPITAL)
Admission: AD | Admit: 2019-07-09 | Discharge: 2019-07-10 | Disposition: A | Discharge status: Home / Self Care | Payer: Medicaid Other | Source: Ambulatory Visit | Attending: Obstetrics and Gynecology | Admitting: Obstetrics and Gynecology

## 2019-07-09 ENCOUNTER — Other Ambulatory Visit: Payer: Self-pay

## 2019-07-09 ENCOUNTER — Encounter: Payer: Self-pay | Admitting: Advanced Practice Midwife

## 2019-07-09 ENCOUNTER — Ambulatory Visit (INDEPENDENT_AMBULATORY_CARE_PROVIDER_SITE_OTHER): Payer: Medicaid Other | Admitting: Advanced Practice Midwife

## 2019-07-09 VITALS — BP 136/91 | HR 107 | Wt 196.4 lb

## 2019-07-09 DIAGNOSIS — Z1389 Encounter for screening for other disorder: Secondary | ICD-10-CM

## 2019-07-09 DIAGNOSIS — O26843 Uterine size-date discrepancy, third trimester: Secondary | ICD-10-CM

## 2019-07-09 DIAGNOSIS — O99119 Other diseases of the blood and blood-forming organs and certain disorders involving the immune mechanism complicating pregnancy, unspecified trimester: Secondary | ICD-10-CM

## 2019-07-09 DIAGNOSIS — Z3483 Encounter for supervision of other normal pregnancy, third trimester: Secondary | ICD-10-CM

## 2019-07-09 DIAGNOSIS — D696 Thrombocytopenia, unspecified: Secondary | ICD-10-CM

## 2019-07-09 DIAGNOSIS — Z331 Pregnant state, incidental: Secondary | ICD-10-CM

## 2019-07-09 DIAGNOSIS — R0989 Other specified symptoms and signs involving the circulatory and respiratory systems: Secondary | ICD-10-CM

## 2019-07-09 DIAGNOSIS — Z3A38 38 weeks gestation of pregnancy: Secondary | ICD-10-CM

## 2019-07-09 LAB — POCT URINALYSIS DIPSTICK OB
Blood, UA: NEGATIVE
Glucose, UA: NEGATIVE
Ketones, UA: NEGATIVE
Leukocytes, UA: NEGATIVE
Nitrite, UA: NEGATIVE

## 2019-07-09 NOTE — Progress Notes (Signed)
   LOW-RISK PREGNANCY VISIT Patient name: Meagan [name] MRN [id number]  Date of birth: Feb 04, 1991 Chief Complaint:   Routine Prenatal Visit (fetus movement but slowing down)  History of Present Illness:   Meagan [name] is a 29 y.o. [id number] female at [redacted]w[redacted]d with an Estimated Date of Delivery: 07/18/19 being seen today for ongoing management of a low-risk pregnancy.  Today she reports some spots w/vision. Baby's movements are more rolling than jerky. Contractions: Not present.  .  Movement: Present. denies leaking of fluid. Review of Systems:   Pertinent items are noted in HPI Denies abnormal vaginal discharge w/ itching/odor/irritation, headaches, visual changes, shortness of breath, chest pain, abdominal pain, severe nausea/vomiting, or problems with urination or bowel movements unless otherwise stated above. Pertinent History Reviewed:  Reviewed past medical,surgical, social, obstetrical and family history.  Reviewed problem list, medications and allergies. Physical Assessment:   Vitals:   07/09/19 1403  BP: (!) 136/91  Pulse: (!) 107  Weight: 196 lb 6.4 oz (89.1 kg)  Body mass index is 34.79 kg/m.        Physical Examination:   General appearance: Well appearing, and in no distress  Mental status: Alert, oriented to person, place, and time  Skin: Warm & dry  Cardiovascular: Normal heart rate noted  Respiratory: Normal respiratory effort, no distress  Abdomen: Soft, gravid, nontender  Pelvic: Cervical exam performed  Dilation: 1 Effacement (%): Thick Station: Ballotable  Extremities: Edema: Trace  Fetal Status: Fetal Heart Rate (bpm): 150 Fundal Height: 41 cm Movement: Present Presentation: Vertex  Chaperone: Amanda Rash    Results for orders placed or performed in visit on 07/09/19 (from the past 24 hour(s))  POC Urinalysis Dipstick OB   Collection Time: 07/09/19  2:13 PM  Result Value Ref Range   Color, UA     Clarity, UA     Glucose, UA Negative Negative   Bilirubin, UA     Ketones, UA neg    Spec Grav, UA     Blood, UA neg    pH, UA     POC,PROTEIN,UA Trace Negative, Trace, Small (1+), Moderate (2+), Large (3+), 4+   Urobilinogen, UA     Nitrite, UA neg    Leukocytes, UA Negative Negative   Appearance     Odor      Assessment & Plan:  1) Low-risk pregnancy [id number] at [redacted]w[redacted]d with an Estimated Date of Delivery: 07/18/19   2) Hx CS, Plans TOLAC,papers signed 11/24  3) size>dates: EFW/AFI asap  4) Borderline BP today.    Meds: No orders of the defined types were placed in this encounter.  Labs/procedures today: NONE  Plan:  Continue routine obstetrical care  Next visit: plan phone call (no data) to check BP  Reviewed: Term labor symptoms and general obstetric precautions including but not limited to vaginal bleeding, contractions, leaking of fluid and fetal movement were reviewed in detail with the patient.  All questions were answered. Has home bp cuff. Check bp Daily, let us know if >140/90.   Follow-up: Return for BP check w/RN tomorrow (phone)  [name] ASAP for AFI/EFW. .  Orders Placed This Encounter  Procedures  . US OB Follow Up  . POC Urinalysis Dipstick OB   Jacklyn Shell DNP, CNM 07/09/2019 2:59 PM

## 2019-07-10 ENCOUNTER — Inpatient Hospital Stay (HOSPITAL_COMMUNITY)
Admission: AD | Admit: 2019-07-10 | Discharge: 2019-07-12 | DRG: 806 | Disposition: A | Discharge status: Home / Self Care | Payer: Medicaid Other | Attending: Obstetrics and Gynecology | Admitting: Obstetrics and Gynecology

## 2019-07-10 ENCOUNTER — Other Ambulatory Visit: Payer: Self-pay

## 2019-07-10 ENCOUNTER — Inpatient Hospital Stay (HOSPITAL_COMMUNITY): Payer: Medicaid Other | Admitting: Anesthesiology

## 2019-07-10 ENCOUNTER — Telehealth (INDEPENDENT_AMBULATORY_CARE_PROVIDER_SITE_OTHER): Payer: Medicaid Other | Admitting: *Deleted

## 2019-07-10 ENCOUNTER — Encounter (HOSPITAL_COMMUNITY): Payer: Self-pay | Admitting: Obstetrics and Gynecology

## 2019-07-10 ENCOUNTER — Encounter (HOSPITAL_COMMUNITY): Payer: Self-pay | Admitting: Family Medicine

## 2019-07-10 ENCOUNTER — Encounter: Payer: Self-pay | Admitting: *Deleted

## 2019-07-10 DIAGNOSIS — F17210 Nicotine dependence, cigarettes, uncomplicated: Secondary | ICD-10-CM | POA: Diagnosis present

## 2019-07-10 DIAGNOSIS — D696 Thrombocytopenia, unspecified: Secondary | ICD-10-CM | POA: Diagnosis present

## 2019-07-10 DIAGNOSIS — O99334 Smoking (tobacco) complicating childbirth: Secondary | ICD-10-CM | POA: Diagnosis present

## 2019-07-10 DIAGNOSIS — O99119 Other diseases of the blood and blood-forming organs and certain disorders involving the immune mechanism complicating pregnancy, unspecified trimester: Secondary | ICD-10-CM | POA: Diagnosis present

## 2019-07-10 DIAGNOSIS — O1414 Severe pre-eclampsia complicating childbirth: Secondary | ICD-10-CM | POA: Diagnosis present

## 2019-07-10 DIAGNOSIS — Z3A38 38 weeks gestation of pregnancy: Secondary | ICD-10-CM

## 2019-07-10 DIAGNOSIS — D6959 Other secondary thrombocytopenia: Secondary | ICD-10-CM | POA: Diagnosis present

## 2019-07-10 DIAGNOSIS — Z30430 Encounter for insertion of intrauterine contraceptive device: Secondary | ICD-10-CM

## 2019-07-10 DIAGNOSIS — Z975 Presence of (intrauterine) contraceptive device: Secondary | ICD-10-CM

## 2019-07-10 DIAGNOSIS — O9912 Other diseases of the blood and blood-forming organs and certain disorders involving the immune mechanism complicating childbirth: Secondary | ICD-10-CM | POA: Diagnosis present

## 2019-07-10 DIAGNOSIS — O34219 Maternal care for unspecified type scar from previous cesarean delivery: Secondary | ICD-10-CM | POA: Diagnosis present

## 2019-07-10 DIAGNOSIS — O26893 Other specified pregnancy related conditions, third trimester: Secondary | ICD-10-CM | POA: Diagnosis present

## 2019-07-10 DIAGNOSIS — F419 Anxiety disorder, unspecified: Secondary | ICD-10-CM | POA: Diagnosis present

## 2019-07-10 DIAGNOSIS — O99344 Other mental disorders complicating childbirth: Secondary | ICD-10-CM | POA: Diagnosis present

## 2019-07-10 DIAGNOSIS — O09299 Supervision of pregnancy with other poor reproductive or obstetric history, unspecified trimester: Secondary | ICD-10-CM

## 2019-07-10 DIAGNOSIS — F410 Panic disorder [episodic paroxysmal anxiety] without agoraphobia: Secondary | ICD-10-CM | POA: Diagnosis present

## 2019-07-10 DIAGNOSIS — O99113 Other diseases of the blood and blood-forming organs and certain disorders involving the immune mechanism complicating pregnancy, third trimester: Secondary | ICD-10-CM | POA: Diagnosis not present

## 2019-07-10 DIAGNOSIS — Z6791 Unspecified blood type, Rh negative: Secondary | ICD-10-CM | POA: Diagnosis not present

## 2019-07-10 DIAGNOSIS — O360130 Maternal care for anti-D [Rh] antibodies, third trimester, not applicable or unspecified: Secondary | ICD-10-CM | POA: Diagnosis not present

## 2019-07-10 DIAGNOSIS — Z98891 History of uterine scar from previous surgery: Secondary | ICD-10-CM

## 2019-07-10 DIAGNOSIS — Z880 Allergy status to penicillin: Secondary | ICD-10-CM | POA: Diagnosis not present

## 2019-07-10 DIAGNOSIS — O1490 Unspecified pre-eclampsia, unspecified trimester: Secondary | ICD-10-CM | POA: Diagnosis present

## 2019-07-10 DIAGNOSIS — Z3483 Encounter for supervision of other normal pregnancy, third trimester: Secondary | ICD-10-CM

## 2019-07-10 DIAGNOSIS — F17200 Nicotine dependence, unspecified, uncomplicated: Secondary | ICD-10-CM | POA: Diagnosis present

## 2019-07-10 DIAGNOSIS — O26899 Other specified pregnancy related conditions, unspecified trimester: Secondary | ICD-10-CM

## 2019-07-10 LAB — CBC
HCT: 36.6 % (ref 36.0–46.0)
HCT: 38.6 % (ref 36.0–46.0)
HCT: 37.3 % (ref 36.0–46.0)
Hemoglobin: 12.6 g/dL (ref 12.0–15.0)
Hemoglobin: 12.8 g/dL (ref 12.0–15.0)
Hemoglobin: 12.4 g/dL (ref 12.0–15.0)
MCH: 32.9 pg (ref 26.0–34.0)
MCH: 32 pg (ref 26.0–34.0)
MCH: 32.1 pg (ref 26.0–34.0)
MCHC: 34.4 g/dL (ref 30.0–36.0)
MCHC: 33.2 g/dL (ref 30.0–36.0)
MCHC: 33.2 g/dL (ref 30.0–36.0)
MCV: 95.6 fL (ref 80.0–100.0)
MCV: 96.5 fL (ref 80.0–100.0)
MCV: 96.6 fL (ref 80.0–100.0)
Platelets: 127 10*3/uL — ABNORMAL LOW (ref 150–400)
Platelets: 124 10*3/uL — ABNORMAL LOW (ref 150–400)
Platelets: 125 10*3/uL — ABNORMAL LOW (ref 150–400)
RBC: 3.83 MIL/uL — ABNORMAL LOW (ref 3.87–5.11)
RBC: 4 MIL/uL (ref 3.87–5.11)
RBC: 3.86 MIL/uL — ABNORMAL LOW (ref 3.87–5.11)
RDW: 13.8 % (ref 11.5–15.5)
RDW: 13.9 % (ref 11.5–15.5)
RDW: 13.9 % (ref 11.5–15.5)
WBC: 8.9 10*3/uL (ref 4.0–10.5)
WBC: 9.5 10*3/uL (ref 4.0–10.5)
WBC: 17.1 10*3/uL — ABNORMAL HIGH (ref 4.0–10.5)
nRBC: 0 % (ref 0.0–0.2)
nRBC: 0 % (ref 0.0–0.2)
nRBC: 0 % (ref 0.0–0.2)

## 2019-07-10 LAB — URINALYSIS, ROUTINE W REFLEX MICROSCOPIC
Bilirubin Urine: NEGATIVE
Bilirubin Urine: NEGATIVE
Glucose, UA: NEGATIVE mg/dL
Glucose, UA: NEGATIVE mg/dL
Hgb urine dipstick: NEGATIVE
Hgb urine dipstick: NEGATIVE
Ketones, ur: NEGATIVE mg/dL
Ketones, ur: NEGATIVE mg/dL
Nitrite: NEGATIVE
Nitrite: NEGATIVE
Protein, ur: NEGATIVE mg/dL
Protein, ur: NEGATIVE mg/dL
Specific Gravity, Urine: 1.018 (ref 1.005–1.030)
Specific Gravity, Urine: 1.021 (ref 1.005–1.030)
pH: 6 (ref 5.0–8.0)
pH: 6 (ref 5.0–8.0)

## 2019-07-10 LAB — PROTEIN / CREATININE RATIO, URINE
Creatinine, Urine: 133.15 mg/dL
Creatinine, Urine: 209.86 mg/dL
Protein Creatinine Ratio: 0.11 mg/mg{Cre} (ref 0.00–0.15)
Protein Creatinine Ratio: 0.07 mg/mg{Cre} (ref 0.00–0.15)
Total Protein, Urine: 15 mg/dL
Total Protein, Urine: 14 mg/dL

## 2019-07-10 LAB — COMPREHENSIVE METABOLIC PANEL
ALT: 13 U/L (ref 0–44)
ALT: 12 U/L (ref 0–44)
AST: 11 U/L — ABNORMAL LOW (ref 15–41)
AST: 11 U/L — ABNORMAL LOW (ref 15–41)
Albumin: 2.5 g/dL — ABNORMAL LOW (ref 3.5–5.0)
Albumin: 2.5 g/dL — ABNORMAL LOW (ref 3.5–5.0)
Alkaline Phosphatase: 87 U/L (ref 38–126)
Alkaline Phosphatase: 77 U/L (ref 38–126)
Anion gap: 10 (ref 5–15)
Anion gap: 11 (ref 5–15)
BUN: 7 mg/dL (ref 6–20)
BUN: 6 mg/dL (ref 6–20)
CO2: 21 mmol/L — ABNORMAL LOW (ref 22–32)
CO2: 21 mmol/L — ABNORMAL LOW (ref 22–32)
Calcium: 9.1 mg/dL (ref 8.9–10.3)
Calcium: 8.7 mg/dL — ABNORMAL LOW (ref 8.9–10.3)
Chloride: 104 mmol/L (ref 98–111)
Chloride: 104 mmol/L (ref 98–111)
Creatinine, Ser: 0.69 mg/dL (ref 0.44–1.00)
Creatinine, Ser: 0.64 mg/dL (ref 0.44–1.00)
GFR calc Af Amer: >60 mL/min (ref >60)
GFR calc Af Amer: >60 mL/min (ref >60)
GFR calc non Af Amer: >60 mL/min (ref >60)
GFR calc non Af Amer: >60 mL/min (ref >60)
Glucose, Bld: 86 mg/dL (ref 70–99)
Glucose, Bld: 116 mg/dL — ABNORMAL HIGH (ref 70–99)
Potassium: 3.8 mmol/L (ref 3.5–5.1)
Potassium: 3.6 mmol/L (ref 3.5–5.1)
Sodium: 135 mmol/L (ref 135–145)
Sodium: 136 mmol/L (ref 135–145)
Total Bilirubin: 0.4 mg/dL (ref 0.3–1.2)
Total Bilirubin: 0.2 mg/dL — ABNORMAL LOW (ref 0.3–1.2)
Total Protein: 5.5 g/dL — ABNORMAL LOW (ref 6.5–8.1)
Total Protein: 5.3 g/dL — ABNORMAL LOW (ref 6.5–8.1)

## 2019-07-10 LAB — NOVEL CORONAVIRUS, NAA: SARS-CoV-2, NAA: NOT DETECTED

## 2019-07-10 MED ORDER — SODIUM CHLORIDE (PF) 0.9 % IJ SOLN
INTRAMUSCULAR | Status: DC | PRN
  Administered 2019-07-10: 12 mL/h via EPIDURAL

## 2019-07-10 MED ORDER — LACTATED RINGERS IV SOLN: 500.0000 mL | INTRAVENOUS | Status: DC | PRN

## 2019-07-10 MED ORDER — OXYTOCIN BOLUS FROM INFUSION
500.0000 mL | Freq: Once | INTRAVENOUS | Status: AC
  Administered 2019-07-10: 500 mL via INTRAVENOUS

## 2019-07-10 MED ORDER — COCONUT OIL OIL
1.0000 "application " | TOPICAL_OIL | Status: DC | PRN
  Administered 2019-07-11: 1 via TOPICAL

## 2019-07-10 MED ORDER — EPHEDRINE 5 MG/ML INJ: 10.0000 mg | INTRAVENOUS | Status: DC | PRN

## 2019-07-10 MED ORDER — SOD CITRATE-CITRIC ACID 500-334 MG/5ML PO SOLN: 30.0000 mL | ORAL | Status: DC | PRN

## 2019-07-10 MED ORDER — ALPRAZOLAM 0.25 MG PO TABS
0.5000 mg | ORAL_TABLET | Freq: Three times a day (TID) | ORAL | Status: DC | PRN
  Administered 2019-07-10 – 2019-07-12 (×5): 0.5 mg via ORAL
  Filled 2019-07-10 (×2): qty 2
  Filled 2019-07-10: qty 1
  Filled 2019-07-10 (×2): qty 2

## 2019-07-10 MED ORDER — PHENYLEPHRINE 40 MCG/ML (10ML) SYRINGE FOR IV PUSH (FOR BLOOD PRESSURE SUPPORT)
80.0000 ug | PREFILLED_SYRINGE | INTRAVENOUS | Status: DC | PRN
  Administered 2019-07-10: 21:00:00 80 ug via INTRAVENOUS

## 2019-07-10 MED ORDER — LABETALOL HCL 5 MG/ML IV SOLN: 40.0000 mg | INTRAVENOUS | Status: DC | PRN

## 2019-07-10 MED ORDER — ACETAMINOPHEN 500 MG PO TABS
1000.0000 mg | ORAL_TABLET | Freq: Once | ORAL | Status: AC
  Administered 2019-07-10: 13:00:00 1000 mg via ORAL
  Filled 2019-07-10: qty 2

## 2019-07-10 MED ORDER — SIMETHICONE 80 MG PO CHEW
80.0000 mg | CHEWABLE_TABLET | ORAL | Status: DC | PRN
  Administered 2019-07-12: 05:00:00 80 mg via ORAL
  Filled 2019-07-10: qty 1

## 2019-07-10 MED ORDER — PRENATAL MULTIVITAMIN CH
1.0000 | ORAL_TABLET | Freq: Every day | ORAL | Status: DC
  Administered 2019-07-11 – 2019-07-12 (×2): 1 via ORAL
  Filled 2019-07-10 (×2): qty 1

## 2019-07-10 MED ORDER — ACETAMINOPHEN 325 MG PO TABS: 650.0000 mg | ORAL_TABLET | Freq: Four times a day (QID) | ORAL | Status: DC | PRN

## 2019-07-10 MED ORDER — MISOPROSTOL 50MCG HALF TABLET: 50.0000 ug | ORAL_TABLET | ORAL | Status: DC | PRN

## 2019-07-10 MED ORDER — DIPHENHYDRAMINE HCL 50 MG/ML IJ SOLN: 12.5000 mg | INTRAMUSCULAR | Status: DC | PRN

## 2019-07-10 MED ORDER — ONDANSETRON HCL 4 MG PO TABS: 4.0000 mg | ORAL_TABLET | ORAL | Status: DC | PRN

## 2019-07-10 MED ORDER — OXYCODONE HCL 5 MG PO TABS: 5.0000 mg | ORAL_TABLET | ORAL | Status: DC | PRN

## 2019-07-10 MED ORDER — HYDRALAZINE HCL 20 MG/ML IJ SOLN: 10.0000 mg | INTRAMUSCULAR | Status: DC | PRN

## 2019-07-10 MED ORDER — ALPRAZOLAM 0.5 MG PO TABS: 0.5000 mg | ORAL_TABLET | Freq: Three times a day (TID) | ORAL | Status: DC

## 2019-07-10 MED ORDER — LACTATED RINGERS IV SOLN: INTRAVENOUS | Status: DC

## 2019-07-10 MED ORDER — ACETAMINOPHEN 325 MG PO TABS
650.0000 mg | ORAL_TABLET | ORAL | Status: DC | PRN
  Administered 2019-07-11 – 2019-07-12 (×4): 650 mg via ORAL
  Filled 2019-07-10 (×4): qty 2

## 2019-07-10 MED ORDER — TERBUTALINE SULFATE 1 MG/ML IJ SOLN: 0.2500 mg | Freq: Once | INTRAMUSCULAR | Status: DC | PRN

## 2019-07-10 MED ORDER — BENZOCAINE-MENTHOL 20-0.5 % EX AERO
1.0000 "application " | INHALATION_SPRAY | CUTANEOUS | Status: DC | PRN
  Administered 2019-07-11: 1 via TOPICAL
  Filled 2019-07-10: qty 56

## 2019-07-10 MED ORDER — IBUPROFEN 600 MG PO TABS
600.0000 mg | ORAL_TABLET | Freq: Four times a day (QID) | ORAL | Status: DC
  Administered 2019-07-10 – 2019-07-11 (×2): 600 mg via ORAL
  Filled 2019-07-10 (×2): qty 1

## 2019-07-10 MED ORDER — ACETAMINOPHEN 325 MG PO TABS: 650.0000 mg | ORAL_TABLET | ORAL | Status: DC | PRN

## 2019-07-10 MED ORDER — LEVONORGESTREL 19.5 MCG/DAY IU IUD
INTRAUTERINE_SYSTEM | Freq: Once | INTRAUTERINE | Status: AC
  Administered 2019-07-10: 21:00:00 1 via INTRAUTERINE

## 2019-07-10 MED ORDER — MAGNESIUM SULFATE 40 GM/1000ML IV SOLN
2.0000 g/h | INTRAVENOUS | Status: AC
  Administered 2019-07-11: 11:00:00 2 g/h via INTRAVENOUS
  Filled 2019-07-10: qty 1000

## 2019-07-10 MED ORDER — PHENYLEPHRINE 40 MCG/ML (10ML) SYRINGE FOR IV PUSH (FOR BLOOD PRESSURE SUPPORT)
80.0000 ug | PREFILLED_SYRINGE | INTRAVENOUS | Status: DC | PRN
  Filled 2019-07-10: qty 10

## 2019-07-10 MED ORDER — ONDANSETRON HCL 4 MG/2ML IJ SOLN: 4.0000 mg | INTRAMUSCULAR | Status: DC | PRN

## 2019-07-10 MED ORDER — NICOTINE 14 MG/24HR TD PT24
14.0000 mg | MEDICATED_PATCH | Freq: Every day | TRANSDERMAL | Status: DC
  Administered 2019-07-10 – 2019-07-11 (×2): 14 mg via TRANSDERMAL
  Filled 2019-07-10 (×3): qty 1

## 2019-07-10 MED ORDER — OXYTOCIN 40 UNITS IN NORMAL SALINE INFUSION - SIMPLE MED: 2.5000 [IU]/h | INTRAVENOUS | Status: DC

## 2019-07-10 MED ORDER — LACTATED RINGERS IV SOLN: 500.0000 mL | Freq: Once | INTRAVENOUS | Status: DC

## 2019-07-10 MED ORDER — BUTALBITAL-APAP-CAFFEINE 50-325-40 MG PO TABS: 1.0000 | ORAL_TABLET | Freq: Four times a day (QID) | ORAL | Status: DC | PRN

## 2019-07-10 MED ORDER — OXYCODONE HCL 5 MG PO TABS: 10.0000 mg | ORAL_TABLET | ORAL | Status: DC | PRN

## 2019-07-10 MED ORDER — FENTANYL-BUPIVACAINE-NACL 0.5-0.125-0.9 MG/250ML-% EP SOLN
12.0000 mL/h | EPIDURAL | Status: DC | PRN
  Filled 2019-07-10: qty 250

## 2019-07-10 MED ORDER — WITCH HAZEL-GLYCERIN EX PADS: 1.0000 "application " | MEDICATED_PAD | CUTANEOUS | Status: DC | PRN

## 2019-07-10 MED ORDER — ONDANSETRON HCL 4 MG/2ML IJ SOLN: 4.0000 mg | Freq: Four times a day (QID) | INTRAMUSCULAR | Status: DC | PRN

## 2019-07-10 MED ORDER — DIPHENHYDRAMINE HCL 25 MG PO CAPS: 25.0000 mg | ORAL_CAPSULE | Freq: Four times a day (QID) | ORAL | Status: DC | PRN

## 2019-07-10 MED ORDER — OXYTOCIN 40 UNITS IN NORMAL SALINE INFUSION - SIMPLE MED
1.0000 m[IU]/min | INTRAVENOUS | Status: DC
  Administered 2019-07-10: 15:00:00 2 m[IU]/min via INTRAVENOUS
  Filled 2019-07-10: qty 1000

## 2019-07-10 MED ORDER — LIDOCAINE HCL (PF) 1 % IJ SOLN: 30.0000 mL | INTRAMUSCULAR | Status: DC | PRN

## 2019-07-10 MED ORDER — MAGNESIUM SULFATE BOLUS VIA INFUSION
4.0000 g | Freq: Once | INTRAVENOUS | Status: AC
  Administered 2019-07-10: 4 g via INTRAVENOUS
  Filled 2019-07-10: qty 1000

## 2019-07-10 MED ORDER — PHENYLEPHRINE 40 MCG/ML (10ML) SYRINGE FOR IV PUSH (FOR BLOOD PRESSURE SUPPORT): 80.0000 ug | PREFILLED_SYRINGE | INTRAVENOUS | Status: DC | PRN

## 2019-07-10 MED ORDER — DIBUCAINE (PERIANAL) 1 % EX OINT: 1.0000 "application " | TOPICAL_OINTMENT | CUTANEOUS | Status: DC | PRN

## 2019-07-10 MED ORDER — MAGNESIUM HYDROXIDE 400 MG/5ML PO SUSP: 30.0000 mL | ORAL | Status: DC | PRN

## 2019-07-10 MED ORDER — MAGNESIUM SULFATE 40 GM/1000ML IV SOLN
2.0000 g/h | INTRAVENOUS | Status: DC
  Filled 2019-07-10: qty 1000

## 2019-07-10 MED ORDER — FENTANYL-BUPIVACAINE-NACL 0.5-0.125-0.9 MG/250ML-% EP SOLN: 12.0000 mL/h | EPIDURAL | Status: DC | PRN

## 2019-07-10 MED ORDER — LABETALOL HCL 5 MG/ML IV SOLN: 20.0000 mg | INTRAVENOUS | Status: DC | PRN

## 2019-07-10 MED ORDER — LABETALOL HCL 5 MG/ML IV SOLN: 80.0000 mg | INTRAVENOUS | Status: DC | PRN

## 2019-07-10 MED ORDER — LEVONORGESTREL 19.5 MCG/DAY IU IUD
INTRAUTERINE_SYSTEM | INTRAUTERINE | Status: AC
  Filled 2019-07-10: qty 1

## 2019-07-10 MED ORDER — SUCRALFATE 1 GM/10ML PO SUSP
1.0000 g | Freq: Three times a day (TID) | ORAL | Status: DC | PRN
  Administered 2019-07-10: 17:00:00 1 g via ORAL
  Filled 2019-07-10 (×2): qty 10

## 2019-07-10 MED ORDER — SENNOSIDES-DOCUSATE SODIUM 8.6-50 MG PO TABS
2.0000 | ORAL_TABLET | ORAL | Status: DC
  Administered 2019-07-10 – 2019-07-12 (×2): 2 via ORAL
  Filled 2019-07-10 (×2): qty 2

## 2019-07-10 MED ORDER — LIDOCAINE HCL (PF) 1 % IJ SOLN
INTRAMUSCULAR | Status: DC | PRN
  Administered 2019-07-10: 8 mL via EPIDURAL

## 2019-07-10 NOTE — Discharge Instructions (Signed)

## 2019-07-10 NOTE — MAU Note (Signed)
Sent in for further eval of BP.  Brought in home cuff, those reading have been higher. +HA, reports + blurring, face seemed puffy this morning, denies epigastric pain.  Neg covid 2 days ago, notified this morning of results.feeling more congested, increased cough. Was unable to pick up rx for [employer]. No fever, no loss of taste of sense of smell. Pt does smoke.

## 2019-07-10 NOTE — MAU Note (Signed)
Blood pressure was 155/97 at home.  Vision blurry, started at 8 pm and having a headache, started at 8 pm.  Denies contractions. No bleeding. No leaking. Baby moving well.

## 2019-07-10 NOTE — H&P (Addendum)
OBSTETRIC ADMISSION HISTORY AND PHYSICAL  Meagan [name] is a 29 y.o. female [phone] with IUP at [redacted]w[redacted]d by [name] presenting for IOL d/t Pre-Eclampsia. She reports +FMs, No LOF, no VB. She has had elevated BP at home (SBP 150s) with blurry vision, headaches and peripheral edema. She denies RUQ pain.  She plans on breast feeding. She request POPs for birth control. She received her prenatal care at [facility]   Dating: By [redacted]w[redacted]d [name] --->  Estimated Date of Delivery: 07/18/19  Sono:    @[redacted]w[redacted]d , CWD, 2 LVEICF, cephalic presentation, posterior placental lie, 226g, 18% EFW  @[redacted]w[redacted]d , CWD, 2 LVEICF, breech presentation, posterior placental lie, 720g, 50% EFW  Prenatal History/Complications: A Neg: Rhogam 04/18/19 H/O Pre-E, on ASA, smoker H/O polyhydramnios in prior pregnancies Elevated BP in the last 24 hours one home cuff with HA, vision changes, and peripheral edema Anxiety w/ panic attacks, treated with longterm xanax  Past Medical History: Past Medical History:  Diagnosis Date  . Abdominal cramps 12/06/2015  . Anxiety   . Asthma    inhaler at bedside  . Daily headache   . Diarrhea 06/04/2013  . Hemiplegic migraine    blurred vision, nausea, dizziness, numbness  . Kidney stones   . Mental disorder    huffed inhalants "affected brain"  . Pyelonephritis complicating pregnancy, antepartum, first trimester 11/05/2011  . Stomach ulcer     Past Surgical History: Past Surgical History:  Procedure Laterality Date  . ANKLE SURGERY Left 09/2016   plates & screws  . CESAREAN SECTION N/A 12/19/2017   Procedure: CESAREAN SECTION;  Surgeon: 10/2016, MD;  Location: [facility] BIRTHING SUITES;  Service: Obstetrics;  Laterality: N/A;    Obstetrical History: OB History    Gravida  5   Para  4   Term  3   Preterm  1   AB  0   Living  4     SAB  0   TAB  0   Ectopic  0   Multiple  0   Live Births  4        Obstetric Comments  G4=LTCS (not vertical) per op note         Social History: Social History   Socioeconomic History  . Marital status: Legally Separated    Spouse name: charles earles  . Number of children: 3  . Years of education: Not on file  . Highest education level: GED or equivalent  Occupational History  . Not on file  Tobacco Use  . Smoking status: Current Every Day Smoker    Packs/day: 1.50    Years: 7.00    Pack years: 10.50    Types: Cigarettes  . Smokeless tobacco: Never Used  Substance and Sexual Activity  . Alcohol use: Not Currently    Alcohol/week: 0.0 standard drinks    Comment: occ; not now  . Drug use: Not Currently    Types: Other-see comments, Marijuana    Comment: huffed inhalants in past, "affected Brain"  . Sexual activity: Yes    Birth control/protection: None  Other Topics Concern  . Not on file  Social History Narrative   Lives at home with her children   Right handed.   Six sodas per day.   Currently about [redacted] weeks pregnant as of 12/15/2018   Social Determinants of Health   Financial Resource Strain: Medium Risk  . Difficulty of Paying Living Expenses: Somewhat hard  Food Insecurity: Food Insecurity Present  . Worried About  Running Out of Food in the Last Year: Sometimes true  . Ran Out of Food in the Last Year: Never true  Transportation Needs: No Transportation Needs  . Lack of Transportation (Medical): No  . Lack of Transportation (Non-Medical): No  Physical Activity: Sufficiently Active  . Days of Exercise per Week: 3 days  . Minutes of Exercise per Session: 50 min  Stress: Stress Concern Present  . Feeling of Stress : Very much  Social Connections: Slightly Isolated  . Frequency of Communication with Friends and Family: More than three times a week  . Frequency of Social Gatherings with Friends and Family: More than three times a week  . Attends Religious Services: 1 to 4 times per year  . Active Member of Clubs or Organizations: No  . Attends Archivist Meetings: Never  .  Marital Status: Living with partner    Family History: Family History  Problem Relation Age of Onset  . Cancer Maternal Grandmother        breast  . Cancer Paternal Grandfather        lung cancer  . Migraines Father   . Hypertension Father   . Hypercholesterolemia Father   . Other Son        ventricular defect  . Hypertension Other     Allergies: Allergies  Allergen Reactions  . Codeine Nausea And Vomiting and Other (See Comments)    Vertigo  . Penicillins Rash and Other (See Comments)    Unknown:  Childhood reaction.  Has patient had a PCN reaction causing immediate rash, facial/tongue/throat swelling, SOB or lightheadedness with hypotension: Unknown Has patient had a PCN reaction causing severe rash involving mucus membranes or skin necrosis: Unknown Has patient had a PCN reaction that required hospitalization No Has patient had a PCN reaction occurring within the last 10 years: No If all of the above answers are "NO", then may pro  . Sulfamethoxazole-Trimethoprim Hives    Medications Prior to Admission  Medication Sig Dispense Refill Last Dose  . acetaminophen (TYLENOL) 500 MG tablet Take 1,000 mg by mouth every 6 (six) hours as needed for moderate pain.   Past Month at Unknown time  . albuterol (VENTOLIN HFA) 108 (90 Base) MCG/ACT inhaler Inhale 1-2 puffs into the lungs every 6 (six) hours as needed for wheezing or shortness of breath. 8 g 2 Past Week at Unknown time  . ALPRAZolam (XANAX) 0.5 MG tablet TAKE 1 TABLET BY MOUTH THREE TIMES DAILY AS NEEDED FOR SLEEP. 90 tablet 0 07/10/2019 at Unknown time  . Blood Pressure Monitor MISC For regular home bp monitoring during pregnancy 1 each 0 07/10/2019 at Unknown time  . nortriptyline (PAMELOR) 25 MG capsule Take 1 capsule (25 mg total) by mouth at bedtime. APPOINTMENT NEEDED FOR FURTHER REFILLS CALL [phone] 30 capsule 11 07/10/2019 at Unknown time  . prenatal vitamin w/FE, FA (PRENATAL 1 + 1) 27-1 MG TABS tablet Take 1 tablet  by mouth daily at 12 noon. 30 each 12 07/10/2019 at Unknown time  . aspirin EC 81 MG tablet Take 162 mg by mouth daily.   never took  . benzonatate (TESSALON PERLES) 100 MG capsule Take 1 capsule (100 mg total) by mouth 3 (three) times daily as needed for cough. (Patient not taking: Reported on 07/01/2019) 30 capsule 1      Review of Systems   All systems reviewed and negative except as stated in HPI  Blood pressure 135/84, pulse (!) 107, temperature 98.4 F (36.9 C),  temperature source Oral, resp. rate 18, height 5\' 3"  (1.6 m), weight 88.6 kg, last menstrual period 10/11/2018, SpO2 97 %, not currently breastfeeding. General appearance: alert, cooperative, appears stated age and no distress Lungs: normal effort Heart: regular rate  Abdomen: soft, non-tender; bowel sounds normal Pelvic: gravid uterus GU: No vaginal lesions  Extremities: Homans sign is negative, no sign of DVT DTR's intact Presentation: cephalic Fetal monitoringBaseline: 150 bpm, + accels, -decels Uterine activityFrequency: q5072m Dilation: 1.5 Effacement (%): Thick Exam by:: Meagan [name] CNM   Prenatal labs: ABO, Rh: --/--/A NEG (01/08 1315) Antibody: PENDING (01/08 1315) Positive s/p Rhogam Rubella: 1.41 (07/07 1645) RPR: Non Reactive (11/13 0836)  HBsAg: Negative (07/07 1645)  HIV: Non Reactive (11/13 0836)  GBS: --Theda Sers/Negative (12/23 1218)  1 hr Glucola 69 Genetic screening normal NIPT, female Anatomy US LV EICF  Prenatal Transfer Tool  Maternal Diabetes: No Genetic Screening: Abnormal:  Results: Other: Maternal Ultrasounds/Referrals: Normal Fetal Ultrasounds or other Referrals:  Fetal echo Maternal Substance Abuse:  No Significant Maternal Medications:  Meds include: Other: Xanax Significant Maternal Lab Results: Group B Strep negative and Rh negative  Results for orders placed or performed during the hospital encounter of 07/10/19 (from the past 24 hour(s))  Type and screen [facility]    Collection Time: 07/10/19  1:15 PM  Result Value Ref Range   ABO/RH(D) A NEG    Antibody Screen PENDING    Sample Expiration      07/13/2019,2359 Performed at [facility], [address] [place], [place] [address]   CBC   Collection Time: 07/10/19  1:25 PM  Result Value Ref Range   WBC 9.5 4.0 - 10.5 K/uL   RBC 4.00 3.87 - 5.11 MIL/uL   Hemoglobin 12.8 12.0 - 15.0 g/dL   HCT 60.438.6 54.036.0 - 98.146.0 %   MCV 96.5 80.0 - 100.0 fL   MCH 32.0 26.0 - 34.0 pg   MCHC 33.2 30.0 - 36.0 g/dL   RDW 19.113.9 47.811.5 - 29.515.5 %   Platelets 124 (L) 150 - 400 K/uL   nRBC 0.0 0.0 - 0.2 %  Results for orders placed or performed during the hospital encounter of 07/09/19 (from the past 24 hour(s))  Urinalysis, Routine w reflex microscopic   Collection Time: 07/10/19 12:11 AM  Result Value Ref Range   Color, Urine YELLOW YELLOW   APPearance CLOUDY (A) CLEAR   Specific Gravity, Urine 1.018 1.005 - 1.030   pH 6.0 5.0 - 8.0   Glucose, UA NEGATIVE NEGATIVE mg/dL   Hgb urine dipstick NEGATIVE NEGATIVE   Bilirubin Urine NEGATIVE NEGATIVE   Ketones, ur NEGATIVE NEGATIVE mg/dL   Protein, ur NEGATIVE NEGATIVE mg/dL   Nitrite NEGATIVE NEGATIVE   Leukocytes,Ua MODERATE (A) NEGATIVE   RBC / HPF 0-5 0 - 5 RBC/hpf   WBC, UA 6-10 0 - 5 WBC/hpf   Bacteria, UA FEW (A) NONE SEEN   Squamous Epithelial / LPF 6-10 0 - 5   Mucus PRESENT   Protein / creatinine ratio, urine   Collection Time: 07/10/19 12:24 AM  Result Value Ref Range   Creatinine, Urine 133.15 mg/dL   Total Protein, Urine 15 mg/dL   Protein Creatinine Ratio 0.11 0.00 - 0.15 mg/mg[Cre]  CBC   Collection Time: 07/10/19 12:34 AM  Result Value Ref Range   WBC 8.9 4.0 - 10.5 K/uL   RBC 3.83 (L) 3.87 - 5.11 MIL/uL   Hemoglobin 12.6 12.0 - 15.0 g/dL   HCT 36.6  36.0 - 46.0 %   MCV 95.6 80.0 - 100.0 fL   MCH 32.9 26.0 - 34.0 pg   MCHC 34.4 30.0 - 36.0 g/dL   RDW 65.7 84.6 - 96.2 %   Platelets 127 (L) 150 - 400 K/uL   nRBC 0.0 0.0 - 0.2 %   Comprehensive metabolic panel   Collection Time: 07/10/19 12:34 AM  Result Value Ref Range   Sodium 135 135 - 145 mmol/L   Potassium 3.8 3.5 - 5.1 mmol/L   Chloride 104 98 - 111 mmol/L   CO2 21 (L) 22 - 32 mmol/L   Glucose, Bld 86 70 - 99 mg/dL   BUN 7 6 - 20 mg/dL   Creatinine, Ser 9.52 0.44 - 1.00 mg/dL   Calcium 9.1 8.9 - 84.1 mg/dL   Total Protein 5.5 (L) 6.5 - 8.1 g/dL   Albumin 2.5 (L) 3.5 - 5.0 g/dL   AST 11 (L) 15 - 41 U/L   ALT 13 0 - 44 U/L   Alkaline Phosphatase 87 38 - 126 U/L   Total Bilirubin 0.4 0.3 - 1.2 mg/dL   GFR calc non Af Amer >60 >60 mL/min   GFR calc Af Amer >60 >60 mL/min   Anion gap 10 5 - 15  Results for orders placed or performed in visit on 07/09/19 (from the past 24 hour(s))  POC Urinalysis Dipstick OB   Collection Time: 07/09/19  2:13 PM  Result Value Ref Range   Color, UA     Clarity, UA     Glucose, UA Negative Negative   Bilirubin, UA     Ketones, UA neg    Spec Grav, UA     Blood, UA neg    pH, UA     POC,PROTEIN,UA Trace Negative, Trace, Small (1+), Moderate (2+), Large (3+), 4+   Urobilinogen, UA     Nitrite, UA neg    Leukocytes, UA Negative Negative   Appearance     Odor      Patient Active Problem List   Diagnosis Date Noted  . Preeclampsia 07/10/2019  . Labor and delivery, indication for care 07/10/2019  . Hemiplegic migraine without status migrainosus, not intractable 03/19/2019  . Rh negative state in antepartum period 01/07/2019  . Supervision of normal pregnancy 01/06/2019  . History of polyhydramnios 01/06/2019  . Previous cesarean section 03/10/2018  . 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  . Diarrhea 12/06/2015  . Hx of preeclampsia 11/16/2015  . 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 Smoker 12/16/2013  . Anxiety 06/17/2013    Assessment/Plan:  JASMAINE ROCHEL is a 29 y.o. G5P3104 at [redacted]w[redacted]d here for IOL for Pre-E with severe features by HA, vision changes, mildly elevated blood pressures on home  cuff.  #Pre-Eclampsia w/ severe fx (by new onset HA and BP) - Pending Urine P:C - No severe range pressures yet, home cuff SBP 150s - Will reassess treatment pending results of CMP  #Anixety - Home medication is xanax TID  #Tob Abuse - Nicotine patch  #Labor: Foley bulb placed and low dose pitocin started. #Pain: Per patient request #FWB:  Category I ; EFW: 3500 #ID:  GBS negative #MOF: breast #MOC: POPs #Circ:  Not desired #GYN: Needs PP pap  Shirlean Mylar, MD [facility] Family Medicine, PGY-1 07/10/2019, 1:55 PM   I saw and evaluated the patient. I agree with the findings and the plan of care as documented in the resident's note. IOL for Pre-E. Patient with headache and mild vision changes  which are intermittent, headache improving with Tylenol. Will cont to reassess and start Mag if severe features persist. Pr/Cr only 0.07. TOLAC. Induced with Pit and Foley bulb. Will keep Pit at max of 6 until FB out then max of 20 unless IUPC placed. Hx of Pre-E with first pregnancy only. History of preterm abruption and ultimately C-section with last delivery. TOLAC consent signed 11/24. Anticipate VBAC.   Jerilynn Birkenhead, MD [facility] Family Medicine Fellow, [facility] for [employer], [facility]

## 2019-07-10 NOTE — Anesthesia Preprocedure Evaluation (Signed)
Anesthesia Evaluation  Patient identified by MRN, date of birth, ID band Patient awake    Reviewed: Allergy & Precautions, H&P , NPO status , Patient's Chart, lab work & pertinent test results, reviewed documented beta blocker date and time   Airway Mallampati: I  TM Distance: >3 FB Neck ROM: full    Dental no notable dental hx. (+) Teeth Intact, Dental Advisory Given   Pulmonary neg pulmonary ROS, Current Smoker,    Pulmonary exam normal breath sounds clear to auscultation       Cardiovascular hypertension, + Past MI  Normal cardiovascular exam Rhythm:regular Rate:Normal     Neuro/Psych negative neurological ROS  negative psych ROS   GI/Hepatic Neg liver ROS,   Endo/Other  negative endocrine ROS  Renal/GU Renal disease  negative genitourinary   Musculoskeletal   Abdominal   Peds  Hematology negative hematology ROS (+)   Anesthesia Other Findings   Reproductive/Obstetrics (+) Pregnancy                             Anesthesia Physical Anesthesia Plan  ASA: III  Anesthesia Plan: Epidural   Post-op Pain Management:    Induction:   PONV Risk Score and Plan:   Airway Management Planned:   Additional Equipment:   Intra-op Plan:   Post-operative Plan:   Informed Consent: I have reviewed the patients History and Physical, chart, labs and discussed the procedure including the risks, benefits and alternatives for the proposed anesthesia with the patient or authorized representative who has indicated his/her understanding and acceptance.       Plan Discussed with:   Anesthesia Plan Comments:         Anesthesia Quick Evaluation

## 2019-07-10 NOTE — Anesthesia Procedure Notes (Signed)
Epidural Patient location during procedure: OB Start time: 07/10/2019 5:45 PM End time: 07/10/2019 5:50 PM  Staffing Anesthesiologist: Bethena Midget, MD  Preanesthetic Checklist Completed: patient identified, IV checked, site marked, risks and benefits discussed, surgical consent, monitors and equipment checked, pre-op evaluation and timeout performed  Epidural Patient position: sitting Prep: DuraPrep and site prepped and draped Patient monitoring: continuous pulse ox and blood pressure Approach: midline Location: L3-L4 Injection technique: LOR air  Needle:  Needle type: Tuohy  Needle gauge: 17 G Needle length: 9 cm and 9 Needle insertion depth: 6 cm Catheter type: closed end flexible Catheter size: 19 Gauge Catheter at skin depth: 11 cm Test dose: negative  Assessment Events: blood not aspirated, injection not painful, no injection resistance, no paresthesia and negative IV test

## 2019-07-10 NOTE — MAU Provider Note (Signed)
Chief Complaint:  Contractions, Hypertension, and Headache   First Provider Initiated Contact with Patient 07/10/19 0023     HPI: Meagan [name] is a 29 y.o. [id number] at 30w6dwho presents to maternity admissions reporting elevated BP at home.  Has a headache and blurry vision since 8pm.  Seen in office with borderline HTN today.. She reports good fetal movement, denies LOF, vaginal bleeding, vaginal itching/burning, urinary symptoms, dizziness, n/v, diarrhea, constipation or fever/chills.  She denies RUQ abdominal pain.  Hypertension This is a new problem. The current episode started today. The problem is unchanged. Associated symptoms include blurred vision and headaches. Pertinent negatives include no anxiety, malaise/fatigue, palpitations, peripheral edema or shortness of breath. There are no associated agents to hypertension. Risk factors: Hx Preeclampsia x 2. Past treatments include nothing. The current treatment provides no improvement. There are no compliance problems.   Headache  This is a new problem. The current episode started today. The problem has been unchanged. The pain is located in the frontal region. The pain does not radiate. The quality of the pain is described as aching. Associated symptoms include blurred vision and a visual change. Pertinent negatives include no fever, muscle aches, nausea or photophobia. Her past medical history is significant for hypertension.   RN note: Blood pressure was 155/97 at home.  Vision blurry, started at 8 pm and having a headache, started at 8 pm.  Denies contractions. No bleeding. No leaking. Baby moving well.  Past Medical History: Past Medical History:  Diagnosis Date  . Abdominal cramps 12/06/2015  . Anxiety   . Asthma    inhaler at bedside  . Daily headache   . Diarrhea 06/04/2013  . Hemiplegic migraine    blurred vision, nausea, dizziness, numbness  . Kidney stones   . Mental disorder    huffed inhalants "affected brain"  .  Pyelonephritis complicating pregnancy, antepartum, first trimester 11/05/2011  . Stomach ulcer     Past obstetric history: OB History  Gravida Para Term Preterm AB Living  5 4 3 1  0 4  SAB TAB Ectopic Multiple Live Births  0 0 0 0 4    # Outcome Date GA Lbr Len/2nd Weight Sex Delivery Anes PTL Lv  5 Current           4 Preterm 12/19/17 [redacted]w[redacted]d  2260 g F CS-LVertical EPI  LIV     Complications: Abruptio Placenta, Polyhydramnios  3 Term 06/18/16 [redacted]w[redacted]d 04:13 / 00:04 3715 g F Vag-Spont EPI N LIV     Complications: Polyhydramnios  2 Term 02/13/14 [redacted]w[redacted]d 03:14 / 00:05 3065 g F Vag-Spont EPI N LIV     Complications: Pre-eclampsia  1 Term 05/30/12 [redacted]w[redacted]d 15:31 / 04:14 3572 g M Vag-Spont EPI N LIV     Complications: Preeclampsia    Obstetric Comments  G4=LTCS (not vertical) per op note    Past Surgical History: Past Surgical History:  Procedure Laterality Date  . ANKLE SURGERY Left 09/2016   plates & screws  . CESAREAN SECTION N/A 12/19/2017   Procedure: CESAREAN SECTION;  Surgeon: 12/21/2017, MD;  Location: [facility]IRTHING SUITES;  Service: Obstetrics;  Laterality: N/A;    Family History: Family History  Problem Relation Age of Onset  . Cancer Maternal Grandmother        breast  . Cancer Paternal Grandfather        lung cancer  . Migraines Father   . Hypertension Father   . Hypercholesterolemia Father   . Other Son  ventricular defect  . Hypertension Other     Social History: Social History   Tobacco Use  . Smoking status: Current Every Day Smoker    Packs/day: 1.50    Years: 7.00    Pack years: 10.50    Types: Cigarettes  . Smokeless tobacco: Never Used  Substance Use Topics  . Alcohol use: Not Currently    Alcohol/week: 0.0 standard drinks    Comment: occ; not now  . Drug use: Not Currently    Types: Other-see comments, Marijuana    Comment: huffed inhalants in past, "affected Brain"    Allergies:  Allergies  Allergen Reactions  . Codeine Nausea And  Vomiting and Other (See Comments)    Vertigo  . Penicillins Rash and Other (See Comments)    Unknown:  Childhood reaction.  Has patient had a PCN reaction causing immediate rash, facial/tongue/throat swelling, SOB or lightheadedness with hypotension: Unknown Has patient had a PCN reaction causing severe rash involving mucus membranes or skin necrosis: Unknown Has patient had a PCN reaction that required hospitalization No Has patient had a PCN reaction occurring within the last 10 years: No If all of the above answers are "NO", then may pro  . Sulfamethoxazole-Trimethoprim Hives    Meds:  Medications Prior to Admission  Medication Sig Dispense Refill Last Dose  . ALPRAZolam (XANAX) 0.5 MG tablet TAKE 1 TABLET BY MOUTH THREE TIMES DAILY AS NEEDED FOR SLEEP. 90 tablet 0 07/10/2019 at Unknown time  . nortriptyline (PAMELOR) 25 MG capsule Take 1 capsule (25 mg total) by mouth at bedtime. APPOINTMENT NEEDED FOR FURTHER REFILLS CALL [phone] 30 capsule 11 07/09/2019 at Unknown time  . prenatal vitamin w/FE, FA (PRENATAL 1 + 1) 27-1 MG TABS tablet Take 1 tablet by mouth daily at 12 noon. 30 each 12 07/09/2019 at Unknown time  . acetaminophen (TYLENOL) 500 MG tablet Take 1,000 mg by mouth every 6 (six) hours as needed for moderate pain.   More than a month at Unknown time  . albuterol (VENTOLIN HFA) 108 (90 Base) MCG/ACT inhaler Inhale 1-2 puffs into the lungs every 6 (six) hours as needed for wheezing or shortness of breath. (Patient not taking: Reported on 07/09/2019) 8 g 2   . aspirin EC 81 MG tablet Take 162 mg by mouth daily.     . benzonatate (TESSALON PERLES) 100 MG capsule Take 1 capsule (100 mg total) by mouth 3 (three) times daily as needed for cough. (Patient not taking: Reported on 07/01/2019) 30 capsule 1   . Blood Pressure Monitor MISC For regular home bp monitoring during pregnancy 1 each 0     I have reviewed patient's Past Medical Hx, Surgical Hx, Family Hx, Social Hx, medications and  allergies.   ROS:  Review of Systems  Constitutional: Negative for fever and malaise/fatigue.  Eyes: Positive for blurred vision. Negative for photophobia.  Respiratory: Negative for shortness of breath.   Cardiovascular: Negative for palpitations.  Gastrointestinal: Negative for nausea.  Neurological: Positive for headaches.   Other systems negative  Physical Exam   Patient Vitals for the past 24 hrs:  BP Temp Temp src Pulse Resp SpO2  07/10/19 0020 -- -- -- -- -- 98 %  07/10/19 0016 126/76 -- -- 92 -- --  07/10/19 0015 -- -- -- -- -- 98 %  07/10/19 0010 -- -- -- -- -- 98 %  07/10/19 0005 137/84 98.7 F (37.1 C) Oral (!) 104 16 99 %   Vitals:   07/10/19 0030  07/10/19 0031 07/10/19 0101 07/10/19 0105  BP:  113/76 124/79   Pulse:  91 87   Resp:      Temp:      TempSrc:      SpO2: 98%   98%    Constitutional: Well-developed, well-nourished female in no acute distress.  Cardiovascular: normal rate and rhythm Respiratory: normal effort, clear to auscultation bilaterally GI: Abd soft, non-tender, gravid appropriate for gestational age.   No rebound or guarding. MS: Extremities nontender, no edema, normal ROM Neurologic: Alert and oriented x 4.  GU: Neg CVAT.  PELVIC EXAM:  Deferred  FHT:  Baseline 140 , moderate variability, accelerations present, no decelerations Contractions: occasional   Labs: --/--/A NEG (10/17 0929) Results for orders placed or performed during the hospital encounter of 07/09/19 (from the past 24 hour(s))  Urinalysis, Routine w reflex microscopic     Status: Abnormal   Collection Time: 07/10/19 12:11 AM  Result Value Ref Range   Color, Urine YELLOW YELLOW   APPearance CLOUDY (A) CLEAR   Specific Gravity, Urine 1.018 1.005 - 1.030   pH 6.0 5.0 - 8.0   Glucose, UA NEGATIVE NEGATIVE mg/dL   Hgb urine dipstick NEGATIVE NEGATIVE   Bilirubin Urine NEGATIVE NEGATIVE   Ketones, ur NEGATIVE NEGATIVE mg/dL   Protein, ur NEGATIVE NEGATIVE mg/dL    Nitrite NEGATIVE NEGATIVE   Leukocytes,Ua MODERATE (A) NEGATIVE   RBC / HPF 0-5 0 - 5 RBC/hpf   WBC, UA 6-10 0 - 5 WBC/hpf   Bacteria, UA FEW (A) NONE SEEN   Squamous Epithelial / LPF 6-10 0 - 5   Mucus PRESENT   Protein / creatinine ratio, urine     Status: None   Collection Time: 07/10/19 12:24 AM  Result Value Ref Range   Creatinine, Urine 133.15 mg/dL   Total Protein, Urine 15 mg/dL   Protein Creatinine Ratio 0.11 0.00 - 0.15 mg/mg[Cre]  CBC     Status: Abnormal   Collection Time: 07/10/19 12:34 AM  Result Value Ref Range   WBC 8.9 4.0 - 10.5 K/uL   RBC 3.83 (L) 3.87 - 5.11 MIL/uL   Hemoglobin 12.6 12.0 - 15.0 g/dL   HCT 68.3 41.9 - 62.2 %   MCV 95.6 80.0 - 100.0 fL   MCH 32.9 26.0 - 34.0 pg   MCHC 34.4 30.0 - 36.0 g/dL   RDW 29.7 98.9 - 21.1 %   Platelets 127 (L) 150 - 400 K/uL   nRBC 0.0 0.0 - 0.2 %  Comprehensive metabolic panel     Status: Abnormal   Collection Time: 07/10/19 12:34 AM  Result Value Ref Range   Sodium 135 135 - 145 mmol/L   Potassium 3.8 3.5 - 5.1 mmol/L   Chloride 104 98 - 111 mmol/L   CO2 21 (L) 22 - 32 mmol/L   Glucose, Bld 86 70 - 99 mg/dL   BUN 7 6 - 20 mg/dL   Creatinine, Ser 9.41 0.44 - 1.00 mg/dL   Calcium 9.1 8.9 - 74.0 mg/dL   Total Protein 5.5 (L) 6.5 - 8.1 g/dL   Albumin 2.5 (L) 3.5 - 5.0 g/dL   AST 11 (L) 15 - 41 U/L   ALT 13 0 - 44 U/L   Alkaline Phosphatase 87 38 - 126 U/L   Total Bilirubin 0.4 0.3 - 1.2 mg/dL   GFR calc non Af Amer >60 >60 mL/min   GFR calc Af Amer >60 >60 mL/min   Anion gap 10 5 - 15  Imaging:  No results found.  MAU Course/MDM: I have ordered labs and reviewed results. Labs normal except mild thrombocytopenia No hypertension while here NST reviewed, reactive, category I  Treatments in MAU included EFM.    Assessment: Single IUP at [redacted]w[redacted]d Labile hypertension, normotensive now Mild Thrombocytopenia  Plan: Discharge home Preeclampsia precautions Labor precautions and fetal kick counts Follow up  with scheduled video visit tomorrow for prenatal visits and recheck of BP  Encouraged to return here or to other Urgent Care/ED if she develops worsening of symptoms, increase in pain, fever, or other concerning symptoms.   Pt stable at time of discharge.  Hansel Feinstein CNM, MSN Certified Nurse-Midwife 07/10/2019 12:23 AM

## 2019-07-10 NOTE — MAU Provider Note (Signed)
History     CSN: [id number]  Arrival date and time: 07/10/19 1123   First Provider Initiated Contact with Patient 07/10/19 1257      Chief Complaint  Patient presents with  . Headache  . Hypertension   HPI  Ms.  Meagan [name] is a 29 y.o. year old G9P3104 female at [redacted]w[redacted]d weeks gestation who presents to MAU reporting worsening H/A, blurry vision, puffy face this AM. She was seen in MAU at midnight today for the same complaints her PEC labs and BP were normal with the exception of low platelet count   Past Medical History:  Diagnosis Date  . Abdominal cramps 12/06/2015  . Anxiety   . Asthma    inhaler at bedside  . Daily headache   . Diarrhea 06/04/2013  . Hemiplegic migraine    blurred vision, nausea, dizziness, numbness  . Kidney stones   . Mental disorder    huffed inhalants "affected brain"  . Pyelonephritis complicating pregnancy, antepartum, first trimester 11/05/2011  . Stomach ulcer     Past Surgical History:  Procedure Laterality Date  . ANKLE SURGERY Left 09/2016   plates & screws  . CESAREAN SECTION N/A 12/19/2017   Procedure: CESAREAN SECTION;  Surgeon: Lazaro Arms, MD;  Location: [facility] BIRTHING SUITES;  Service: Obstetrics;  Laterality: N/A;    Family History  Problem Relation Age of Onset  . Cancer Maternal Grandmother        breast  . Cancer Paternal Grandfather        lung cancer  . Migraines Father   . Hypertension Father   . Hypercholesterolemia Father   . Other Son        ventricular defect  . Hypertension Other     Social History   Tobacco Use  . Smoking status: Current Every Day Smoker    Packs/day: 1.50    Years: 7.00    Pack years: 10.50    Types: Cigarettes  . Smokeless tobacco: Never Used  Substance Use Topics  . Alcohol use: Not Currently    Alcohol/week: 0.0 standard drinks    Comment: occ; not now  . Drug use: Not Currently    Types: Other-see comments, Marijuana    Comment: huffed inhalants in past, "affected Brain"     Allergies:  Allergies  Allergen Reactions  . Codeine Nausea And Vomiting and Other (See Comments)    Vertigo  . Penicillins Rash and Other (See Comments)    Unknown:  Childhood reaction.  Has patient had a PCN reaction causing immediate rash, facial/tongue/throat swelling, SOB or lightheadedness with hypotension: Unknown Has patient had a PCN reaction causing severe rash involving mucus membranes or skin necrosis: Unknown Has patient had a PCN reaction that required hospitalization No Has patient had a PCN reaction occurring within the last 10 years: No If all of the above answers are "NO", then may pro  . Sulfamethoxazole-Trimethoprim Hives    Medications Prior to Admission  Medication Sig Dispense Refill Last Dose  . acetaminophen (TYLENOL) 500 MG tablet Take 1,000 mg by mouth every 6 (six) hours as needed for moderate pain.   Past Month at Unknown time  . albuterol (VENTOLIN HFA) 108 (90 Base) MCG/ACT inhaler Inhale 1-2 puffs into the lungs every 6 (six) hours as needed for wheezing or shortness of breath. 8 g 2 Past Week at Unknown time  . ALPRAZolam (XANAX) 0.5 MG tablet TAKE 1 TABLET BY MOUTH THREE TIMES DAILY AS NEEDED FOR SLEEP. 90 tablet 0  07/10/2019 at Unknown time  . Blood Pressure Monitor MISC For regular home bp monitoring during pregnancy 1 each 0 07/10/2019 at Unknown time  . nortriptyline (PAMELOR) 25 MG capsule Take 1 capsule (25 mg total) by mouth at bedtime. APPOINTMENT NEEDED FOR FURTHER REFILLS CALL [phone] 30 capsule 11 07/10/2019 at Unknown time  . prenatal vitamin w/FE, FA (PRENATAL 1 + 1) 27-1 MG TABS tablet Take 1 tablet by mouth daily at 12 noon. 30 each 12 07/10/2019 at Unknown time  . aspirin EC 81 MG tablet Take 162 mg by mouth daily.   never took  . benzonatate (TESSALON PERLES) 100 MG capsule Take 1 capsule (100 mg total) by mouth 3 (three) times daily as needed for cough. (Patient not taking: Reported on 07/01/2019) 30 capsule 1     Review of Systems   Constitutional: Negative.   HENT: Positive for facial swelling (puffy face).   Eyes: Negative.   Respiratory: Negative.   Cardiovascular: Negative.   Gastrointestinal: Negative.   Endocrine: Negative.   Genitourinary: Negative.   Musculoskeletal: Negative.   Skin: Negative.   Allergic/Immunologic: Negative.   Neurological: Positive for headaches.  Hematological: Negative.   Psychiatric/Behavioral: Negative.    Physical Exam   Patient Vitals for the past 24 hrs:  BP Temp Temp src Pulse Resp SpO2 Height Weight  07/10/19 1344 135/84 98.4 F (36.9 C) Oral - 18 - - -  07/10/19 1342 - 98.5 F (36.9 C) Oral (!) 107 18 - 5\' 3"  (1.6 m) 88.6 kg  07/10/19 1332 135/84 98.4 F (36.9 C) Oral (!) 107 16 - - -  07/10/19 1255 - - - - - 97 % - -  07/10/19 1250 - - - - - 98 % - -  07/10/19 1153 129/83 - - (!) 114 - 96 % - -  07/10/19 1139 134/89 98.3 F (36.8 C) Oral (!) 125 (!) 22 98 % 5\' 3"  (1.6 m) 88.6 kg    Physical Exam  Nursing note and vitals reviewed. Constitutional: She is oriented to person, place, and time. She appears well-developed and well-nourished.  HENT:  Head: Normocephalic and atraumatic.  Eyes: Pupils are equal, round, and reactive to light.  Cardiovascular: Normal rate.  Respiratory: Effort normal.  GI: Soft.  Genitourinary:    Genitourinary Comments: Dilation: 1.5 Effacement (%): 50 Cervical Position: Posterior Station: -3 Presentation: Vertex Exam by: Sunday Corn, CNM   Musculoskeletal:        General: Normal range of motion.     Cervical back: Normal range of motion.  Neurological: She is alert and oriented to person, place, and time.  Skin: Skin is warm and dry.  Psychiatric: She has a normal mood and affect. Her behavior is normal. Judgment and thought content normal.   NST - FHR: 145 bpm / moderate variability / accels present / decels absent / TOCO: irregular UC's  MAU Course  Procedures  MDM CCUA CBC CMP P/C Ratio Serial BP's  Tylenol 1000  mg po   Results for orders placed or performed during the hospital encounter of 07/10/19 (from the past 24 hour(s))  Type and screen [place]     Status: None (Preliminary result)   Collection Time: 07/10/19  1:15 PM  Result Value Ref Range   ABO/RH(D) A NEG    Antibody Screen POS    Sample Expiration      07/13/2019,2359 Performed at [facility], [place] [address] [place], [place] [address]    Antibody  Identification PENDING   CBC     Status: Abnormal   Collection Time: 07/10/19  1:25 PM  Result Value Ref Range   WBC 9.5 4.0 - 10.5 K/uL   RBC 4.00 3.87 - 5.11 MIL/uL   Hemoglobin 12.8 12.0 - 15.0 g/dL   HCT 48.2 50.0 - 37.0 %   MCV 96.5 80.0 - 100.0 fL   MCH 32.0 26.0 - 34.0 pg   MCHC 33.2 30.0 - 36.0 g/dL   RDW 48.8 89.1 - 69.4 %   Platelets 124 (L) 150 - 400 K/uL   nRBC 0.0 0.0 - 0.2 %  Comprehensive metabolic panel     Status: Abnormal   Collection Time: 07/10/19  1:25 PM  Result Value Ref Range   Sodium 136 135 - 145 mmol/L   Potassium 3.6 3.5 - 5.1 mmol/L   Chloride 104 98 - 111 mmol/L   CO2 21 (L) 22 - 32 mmol/L   Glucose, Bld 116 (H) 70 - 99 mg/dL   BUN 6 6 - 20 mg/dL   Creatinine, Ser 5.03 0.44 - 1.00 mg/dL   Calcium 8.7 (L) 8.9 - 10.3 mg/dL   Total Protein 5.3 (L) 6.5 - 8.1 g/dL   Albumin 2.5 (L) 3.5 - 5.0 g/dL   AST 11 (L) 15 - 41 U/L   ALT 12 0 - 44 U/L   Alkaline Phosphatase 77 38 - 126 U/L   Total Bilirubin 0.2 (L) 0.3 - 1.2 mg/dL   GFR calc non Af Amer >60 >60 mL/min   GFR calc Af Amer >60 >60 mL/min   Anion gap 11 5 - 15  Urinalysis, Routine w reflex microscopic     Status: Abnormal   Collection Time: 07/10/19  1:45 PM  Result Value Ref Range   Color, Urine YELLOW YELLOW   APPearance HAZY (A) CLEAR   Specific Gravity, Urine 1.021 1.005 - 1.030   pH 6.0 5.0 - 8.0   Glucose, UA NEGATIVE NEGATIVE mg/dL   Hgb urine dipstick NEGATIVE NEGATIVE   Bilirubin Urine NEGATIVE NEGATIVE   Ketones, ur NEGATIVE NEGATIVE mg/dL   Protein,  ur NEGATIVE NEGATIVE mg/dL   Nitrite NEGATIVE NEGATIVE   Leukocytes,Ua TRACE (A) NEGATIVE   RBC / HPF 0-5 0 - 5 RBC/hpf   WBC, UA 0-5 0 - 5 WBC/hpf   Bacteria, UA RARE (A) NONE SEEN   Squamous Epithelial / LPF 6-10 0 - 5   Mucus PRESENT   Protein / creatinine ratio, urine     Status: None   Collection Time: 07/10/19  1:45 PM  Result Value Ref Range   Creatinine, Urine 209.86 mg/dL   Total Protein, Urine 14 mg/dL   Protein Creatinine Ratio 0.07 0.00 - 0.15 mg/mg[Cre]      Assessment and Plan  Preeclampsia - Admit to L&D after consult with Dr. Shawnie Pons - Repeat PEC labs - Dr. Morene Antu called and notified of assessment, labs pending, VE, and recommendation for admission from Dr. Shawnie Pons. Dr. Morene Antu will assume care of the patient upon admission to L&D.   Raelyn Mora, MSN, CNM 07/10/2019, 1:09 PM

## 2019-07-10 NOTE — Discharge Summary (Signed)
Postpartum Discharge Summary     Patient Name: Meagan [name] DOB: 1991/04/13 MRN: [id number]  Date of admission: 07/10/2019 Delivering Provider: Clarnce Flock   Date of discharge: 07/12/2019  Admitting diagnosis: severe pre-eclampsia (elevated BPs at home, neuro s/s) Intrauterine pregnancy: [redacted]w[redacted]d    Secondary diagnosis:  H/o c-section, Smoker, migraines, h/o huffing, h/o stomach ulcer, Rh negative     Discharge diagnosis: Term Pregnancy Delivered, VBAC and Preeclampsia (severe)                                                                                                Post partum procedures:Post placental Liletta IUD placed  Augmentation: AROM, Pitocin and Foley Balloon  Complications: None  Hospital course:  Induction of Labor With Vaginal Delivery   29y.o. yo [id number] admitted to the hospital 07/10/2019 for induction of labor.  Indication for induction: Preeclampsia with severe features by neuro symptoms (headache, scotoma).  Patient had an uncomplicated labor course as follows: induced with foley bulb and pitocin, then had AROM and progressed to complete.  Membrane Rupture Time/Date: 7:40 PM ,07/10/2019   Intrapartum Procedures: Episiotomy: None [1]                                         Lacerations:  None [1]  Patient had delivery of a Viable infant.  Information for the patient's newborn:  ScTenaya, Hilyer0[[id number]][facility]  [facility] Delivery time: 8:42 PM   Details of delivery can be found in separate delivery note.   *Pregnancy: normal PP cramping. Oxycodone given for good effect. Continue with tylenol. Pt with h/o stomach ulcer so would avoid NSAIDs *Pre-eclampsia: doing well on no meds. S/p PP Mg x 24h *SW: seen by  And signed off on *Rh neg: s/p rhogam already *Gest thrombocytopenia: follow level PP    Magnesium Sulfate received: Yes BMZ received: No Rhophylac:Yes MMR:N/A  Physical exam  Vitals:   07/11/19  1939 07/12/19 0001 07/12/19 0436 07/12/19 0736  BP: 127/72 134/76 125/78 128/75  Pulse: 85 86 88 93  Resp: _0 Temp: 97.7 F (36.5 C) 98.3 F (36.8 C) 98.2 F (36.8 C) 98.7 F (37.1 C)  TempSrc: Axillary Oral Oral Oral  SpO2: 99% 99% 99% 98%  Weight:      Height:       General: Well nourished, well developed female in no acute distress. Abdomen: nttp, firm fundus below the umbilicus, mildly ttp in rlq and llq areas, no peritoneal s/s.  Cardiovascular: S1, S2 normal, no murmur, rub or gallop, regular rate and rhythm Respiratory: CTAB Extremities: no clubbing, cyanosis or edema Skin: Warm and dry.  Labs: Lab Results  Component Value Date   WBC 17.1 (H) 07/10/2019   HGB 12.4 07/10/2019   HCT 37.3 07/10/2019   MCV 96.6 07/10/2019   PLT 125 (L) 07/10/2019   CMP Latest Ref Rng & Units 07/10/2019  Glucose 70 - 99 mg/dL 116(H)  BUN 6 - 20 mg/dL 6  Creatinine 0.44 - 1.00 mg/dL 0.64  Sodium 135 - 145 mmol/L 136  Potassium 3.5 - 5.1 mmol/L 3.6  Chloride 98 - 111 mmol/L 104  CO2 22 - 32 mmol/L 21(L)  Calcium 8.9 - 10.3 mg/dL 8.7(L)  Total Protein 6.5 - 8.1 g/dL 5.3(L)  Total Bilirubin 0.3 - 1.2 mg/dL 0.2(L)  Alkaline Phos 38 - 126 U/L 77  AST 15 - 41 U/L 11(L)  ALT 0 - 44 U/L 12    Discharge instruction: per After Visit Summary and "Baby and Me Booklet".  After visit meds:  Allergies as of 07/12/2019      Reactions   Codeine Nausea And Vomiting, Other (See Comments)   Vertigo   Penicillins Rash, Other (See Comments)   Unknown:  Childhood reaction.  Has patient had a PCN reaction causing immediate rash, facial/tongue/throat swelling, SOB or lightheadedness with hypotension: Unknown Has patient had a PCN reaction causing severe rash involving mucus membranes or skin necrosis: Unknown Has patient had a PCN reaction that required hospitalization No Has patient had a PCN reaction occurring within the last 10 years: No If all of the above answers are "NO", then may pro    Sulfamethoxazole-trimethoprim Hives      Medication List    TAKE these medications   acetaminophen 500 MG tablet Commonly known as: TYLENOL Take 500-1,000 mg by mouth every 6 (six) hours as needed for moderate pain.   albuterol 108 (90 Base) MCG/ACT inhaler Commonly known as: VENTOLIN HFA Inhale 1-2 puffs into the lungs every 6 (six) hours as needed for wheezing or shortness of breath.   ALPRAZolam 0.5 MG tablet Commonly known as: XANAX TAKE 1 TABLET BY MOUTH THREE TIMES DAILY AS NEEDED FOR SLEEP. What changed: See the new instructions.   benzonatate 100 MG capsule Commonly known as: Tessalon Perles Take 1 capsule (100 mg total) by mouth 3 (three) times daily as needed for cough.   Blood Pressure Monitor Misc For regular home bp monitoring during pregnancy   nortriptyline 25 MG capsule Commonly known as: PAMELOR Take 1 capsule (25 mg total) by mouth at bedtime. APPOINTMENT NEEDED FOR FURTHER REFILLS CALL [phone]   oxyCODONE-acetaminophen 5-325 MG tablet Commonly known as: PERCOCET/ROXICET Take 1 tablet by mouth every 6 (six) hours as needed.   prenatal vitamin w/FE, FA 27-1 MG Tabs tablet Take 1 tablet by mouth daily at 12 noon.       Diet: routine diet  Activity: Advance as tolerated. Pelvic rest for 6 weeks.   Outpatient follow up:6 weeks Follow up Appt: Future Appointments  Date Time Provider [place]  07/16/2019  9:00 AM [facility] [name] [facility] None  07/16/2019  9:50 AM Jonnie Kind, MD [facility]   Follow up Visit: [place]. Go in 1 week(s).   Contact information: [address] [place] [phone]           Please schedule this patient for Postpartum visit in: 6 weeks with the following provider: Any provider For C/S patients schedule nurse incision check in weeks 2 weeks: no High risk pregnancy complicated by: PreE w SF, TOLAC Delivery mode:   VBAC Anticipated Birth Control:  post placental IUD placed PP Procedures needed: BP check, IUD string check  Schedule Integrated BH visit: no Request made for 1 week in person [facility]    Newborn Data: Live born female  Birth  Weight: 7 lb 4.4 oz (3300 g) APGAR: 7, 8  Newborn Delivery   Birth date/time: 07/10/2019 20:42:00 Delivery type: VBAC, Spontaneous      Baby Feeding: Breast Disposition:home with mother   07/12/2019 Aletha Halim, MD  Called by nurse at time of discharge. Temp of 102. Was breast feeding, now bottle feeding. Breast engored and swollen per nursing. Suspect mastitis. Keflex 500 mg po tid x 7 days sent to pharmacy.  Arlina Robes, MD

## 2019-07-10 NOTE — Progress Notes (Signed)
   NURSE VISIT- BLOOD PRESSURE CHECK  SUBJECTIVE:  Meagan [name] is a 29 y.o. [phone] female here for BP check. She is [redacted]w[redacted]d pregnant    HYPERTENSION ROS:  Pregnant/postpartum:  . Severe headaches that don't go away with tylenol/other medicines: Yes  . Visual changes (seeing spots/double/blurred vision) Yes . Severe pain under right breast breast or in center of upper chest No  . Severe nausea/vomiting No  . Taking medicines as instructed not applicable   OBJECTIVE:  BP (!) 151/109   Pulse (!) 102   LMP 10/11/2018     ASSESSMENT: Pregnancy [redacted]w[redacted]d  blood pressure check  PLAN: Discussed with Dr. Despina Hidden   Recommendations: go to [facility] now.   Follow-up: as scheduled   Ariam Mol, Faith Rogue  07/10/2019 10:32 AM

## 2019-07-10 NOTE — Procedures (Signed)
  Post-Placental IUD Insertion Procedure Note  Patient identified, informed consent signed prior to delivery, signed copy in chart, time out was performed.    Vaginal, labial and perineal areas thoroughly inspected for lacerations. No lacerations identified prior to insertion of Liletta IUD.    - IUD grasped between sterile gloved fingers. Sterile lubrication applied to sterile gloved hand for ease of insertion. Fundus identified through abdominal wall using non-insertion hand. IUD inserted to fundus with bimanual technique. IUD carefully released at the fundus and insertion hand gently removed from vagina.    Strings trimmed to the level of the cervical ox. Patient tolerated procedure well.  Lot # [id number] Expiration Date05/01/2023  Patient given post procedure instructions and IUD care card with expiration date.  Patient is asked to keep IUD strings tucked in her vagina until her postpartum follow up visit in 4-6 weeks. Patient advised to abstain from sexual intercourse and pulling on strings before her follow-up visit. Patient verbalized an understanding of the plan of care and agrees.

## 2019-07-10 NOTE — Progress Notes (Signed)
Labor Progress Note Meagan [name] is a 29 y.o. [phone] at [redacted]w[redacted]d presented for IOL for severe Pre-E. S: Feeling pressure but comfortable with epidural.   O:  BP (!) 141/78   Pulse (!) 103   Temp 98.4 F (36.9 C) (Oral)   Resp 16   Ht 5\' 3"  (1.6 m)   Wt 88.6 kg   LMP 10/11/2018   SpO2 100%   BMI 34.60 kg/m  EFM: 150, moderate variability, pos accels, variable decels Toco: q23m  CVE: Dilation: 8 Effacement (%): 70 Cervical Position: Posterior Station: -3 Presentation: Vertex Exam by:: Mary [email or web address] Johnson, RN    A&P: 29 y.o. [phone] [redacted]w[redacted]d here for IOL for severe Pre-E. #Labor: Progressing well. TOLAC. S/p Foley bulb and on Pit at 8. AROM at this check with clear, bloody fluid. Anticipate VBAC soon. #Pain: Epidural #FWB: Cat II #GBS negative #Severe Pre-E: Severe by headache and scotoma. Cont Mag. Labetalol protocol ordered but no severe-range pressures thus far.   [redacted]w[redacted]d, MD 7:44 PM

## 2019-07-11 DIAGNOSIS — D696 Thrombocytopenia, unspecified: Secondary | ICD-10-CM | POA: Diagnosis present

## 2019-07-11 DIAGNOSIS — O99119 Other diseases of the blood and blood-forming organs and certain disorders involving the immune mechanism complicating pregnancy, unspecified trimester: Secondary | ICD-10-CM | POA: Diagnosis present

## 2019-07-11 MED ORDER — FAMOTIDINE 20 MG PO TABS
10.0000 mg | ORAL_TABLET | Freq: Two times a day (BID) | ORAL | Status: DC
  Administered 2019-07-11 – 2019-07-12 (×3): 10 mg via ORAL
  Filled 2019-07-11 (×3): qty 1

## 2019-07-11 MED ORDER — LACTATED RINGERS IV SOLN: INTRAVENOUS | Status: AC

## 2019-07-11 MED ORDER — LACTATED RINGERS IV SOLN: INTRAVENOUS | Status: DC

## 2019-07-11 MED ORDER — RHO D IMMUNE GLOBULIN 1500 UNIT/2ML IJ SOSY
300.0000 ug | PREFILLED_SYRINGE | Freq: Once | INTRAMUSCULAR | Status: AC
  Administered 2019-07-11: 21:00:00 300 ug via INTRAVENOUS
  Filled 2019-07-11: qty 2

## 2019-07-11 NOTE — Lactation Note (Signed)
This note was copied from a baby's chart. Lactation Consultation Note  Patient Name: Meagan [name] [id number] Date: 07/11/2019 Reason for consult: Initial assessment;1st time breastfeeding;Early term 37-38.6wks GHTN on MgSO4  LC in to visit with P5 Mom of [redacted]w[redacted]d baby at 79 hrs old.  Mom has breast fed baby 4 times already, but complaining of pinching on the nipple.    Mom breastfed her other babies partially for a few days.  Mom appears more committed to breastfeeding this baby and was proud to say that baby has been exclusively breastfeeding so far.    Baby swaddled in crib, sucking on pacifier.  Last breastfeeding was >5 hrs ago.  Shared concerns with Mom about early pacifier use and infant weight loss.  Manual pump in room, and a DEBP set up at bedside.  Mom states she has not pumped yet.  Encouraged Mom to latch baby to breast and not to pump unless baby is not breastfeeding, or has weight loss etc.    Reviewed breast massage and hand expression.  Colostrum flow is great and Mom aware of spoon feeding baby if baby isn't latching.   Baby placed in football hold on right breast.  Breasts are somewhat flaccid and rolled up cloth diaper placed under breast for support.  Baby latched after a couple attempts.  Mom complained of feeling pinching, but that subsided after a couple minutes.  Baby's mouth wide and lips flanged well.  Deep jaw extensions noted and some swallows identified.  Mom shown how to compress breast during sucking to increase milk transfer to baby.   Mom encouraged to keep baby STS and offer breast with cues.  Mom to ask for help prn, RN aware of this.   Lactation brochure left in room.  Mom aware of IP and OP lactation support available to her.   Maternal Data Formula Feeding for Exclusion: No Has patient been taught Hand Expression?: Yes Does the patient have breastfeeding experience prior to this delivery?: Yes  Feeding Feeding Type: Breast Fed  LATCH Score Latch:  Grasps breast easily, tongue down, lips flanged, rhythmical sucking.  Audible Swallowing: A few with stimulation  Type of Nipple: Everted at rest and after stimulation  Comfort (Breast/Nipple): Soft / non-tender  Hold (Positioning): Assistance needed to correctly position infant at breast and maintain latch.  LATCH Score: 8  Interventions Interventions: Breast feeding basics reviewed;Assisted with latch;Skin to skin;Breast massage;Hand express;Breast compression;Adjust position;Support pillows;Position options;Expressed milk;Hand pump  Lactation Tools Discussed/Used Tools: Pump Breast pump type: Manual Pump Review: Setup, frequency, and cleaning;Milk Storage Initiated by:: OBSC RN Date initiated:: 07/11/19   Consult Status Consult Status: Follow-up Date: 07/12/19 Follow-up type: In-patient    [name] 07/11/2019, 9:52 AM

## 2019-07-11 NOTE — Anesthesia Postprocedure Evaluation (Signed)
Anesthesia Post Note  Patient: Meagan [name]  Procedure(s) Performed: AN AD HOC LABOR EPIDURAL     Patient location during evaluation: Mother Baby Anesthesia Type: Epidural Level of consciousness: awake and alert, oriented and patient cooperative Pain management: pain level controlled Vital Signs Assessment: post-procedure vital signs reviewed and stable Respiratory status: spontaneous breathing Cardiovascular status: stable Postop Assessment: no headache, epidural receding, patient able to bend at knees and no signs of nausea or vomiting Anesthetic complications: no Comments: Pt. States she is walking.  Pain score 0.     Last Vitals:  Vitals:   07/11/19 0600 07/11/19 0700  BP:    Pulse:    Resp: 17 18  Temp:    SpO2:      Last Pain:  Vitals:   07/11/19 0600  TempSrc:   PainSc: 0-No pain   Pain Goal: Patients Stated Pain Goal: 5 (07/11/19 0200)                 Merrilyn Puma

## 2019-07-11 NOTE — Plan of Care (Signed)
  Problem: Clinical Measurements: Goal: Ability to maintain clinical measurements within normal limits will improve Outcome: Progressing Goal: Will remain free from infection Outcome: Progressing   Problem: Coping: Goal: Level of anxiety will decrease Outcome: Progressing   Problem: Pain Managment: Goal: General experience of comfort will improve Outcome: Progressing   

## 2019-07-11 NOTE — Progress Notes (Signed)
CSW acknowledges consult.  [name] is currently on magnesium until later tonight, CSW will meet with [name] tomorrow.  Celso Sickle, LCSW Clinical Social Worker [facility] Cell#: [phone]

## 2019-07-11 NOTE — Progress Notes (Addendum)
Post Partum Day 1 s/p SVD, VBAC, with delivery notable for LNC x 7 Subjective: no complaints, up ad lib, voiding and tolerating PO  Objective: Blood pressure (!) 109/59, pulse 90, temperature 97.8 F (36.6 C), temperature source Oral, resp. rate 18, height 5\' 3"  (1.6 m), weight 88.6 kg, last menstrual period 10/11/2018, SpO2 100 %, unknown if currently breastfeeding.  Physical Exam:  Temp:  [97.8 F (36.6 C)-98.6 F (37 C)] 97.8 F (36.6 C) (01/09 0401) Pulse Rate:  [75-125] 90 (01/09 0401) Resp:  [16-22] 18 (01/09 0700) BP: (92-151)/(35-109) 109/59 (01/09 0401) SpO2:  [95 %-100 %] 100 % (01/09 0400) Weight:  [88.6 kg] 88.6 kg (01/08 1342)  General: alert, cooperative and no distress Lochia: appropriate Uterine Fundus: firm Incision: n/a DVT Evaluation: No evidence of DVT seen on physical exam.  Recent Labs    07/10/19 1325 07/10/19 2136  HGB 12.8 12.4  HCT 38.6 37.3    Assessment/Plan: Plan for discharge tomorrow, Mag til 9 pm tonight Breast IUD placed D/c Sunday  LOS: 1 day   Saturday 07/11/2019, 7:02 AM

## 2019-07-12 ENCOUNTER — Encounter (HOSPITAL_COMMUNITY): Payer: Self-pay | Admitting: Obstetrics and Gynecology

## 2019-07-12 LAB — RH IG WORKUP (INCLUDES ABO/RH)
ABO/RH(D): A NEG
Fetal Screen: NEGATIVE
Gestational Age(Wks): 38.6
Unit division: 0

## 2019-07-12 MED ORDER — CEPHALEXIN 500 MG PO CAPS: 500.0000 mg | ORAL_CAPSULE | Freq: Three times a day (TID) | ORAL | 2 refills | Status: DC

## 2019-07-12 MED ORDER — OXYCODONE-ACETAMINOPHEN 5-325 MG PO TABS: 1.0000 | ORAL_TABLET | Freq: Four times a day (QID) | ORAL | 0 refills | Status: DC | PRN

## 2019-07-12 MED ORDER — CEPHALEXIN 500 MG PO CAPS: 500.0000 mg | ORAL_CAPSULE | Freq: Three times a day (TID) | ORAL | 0 refills | Status: DC

## 2019-07-12 MED ORDER — CEPHALEXIN 500 MG PO CAPS: 500.0000 mg | ORAL_CAPSULE | Freq: Three times a day (TID) | ORAL | 0 refills | Status: AC

## 2019-07-12 MED ORDER — OXYCODONE HCL 5 MG PO TABS
5.0000 mg | ORAL_TABLET | Freq: Four times a day (QID) | ORAL | Status: DC | PRN
  Administered 2019-07-12: 5 mg via ORAL
  Filled 2019-07-12: qty 1

## 2019-07-12 NOTE — Lactation Note (Signed)
This note was copied from a baby's chart. Lactation Consultation Note  Patient Name: Meagan [name] OEVOJ'J Date: 07/12/2019   Mom called for lactation but when [facility] came in the room baby was asleep, mom told LC to come back at two, she'd like to try latching baby on, she's still experiencing pain due to soreness. Baby is currently on EBM and Gerber formula; baby originally got Similac but she'll be participating in the [facility] program, so she switched to [employer]. LC to come back at 2 pm for a feeding assist.   Maternal Data    Feeding Feeding Type: Bottle Fed - Formula Nipple Type: Slow - flow  LATCH Score                   Interventions    Lactation Tools Discussed/Used     Consult Status      Amarilys Lyles S Malashia Kamaka 07/12/2019, 1:10 PM

## 2019-07-12 NOTE — Lactation Note (Signed)
This note was copied from a baby's chart. Lactation Consultation Note RN called LC d/t mom having painful latches. Mom stated she can't stand the thought of latching at this time. LC assessed breast. Nipples are bruised, tender, and mom doesn't even want hand expression or breast massage. Discussed pumping for stimulation to allow her breast to rest from latching. Make sure not to turn up DEBP to high that it hurts. Mom has been BF a lot. Baby has been cluster feeding. Mom had asked for formula to give to baby. RN attempted to give w/bottle, baby spit out nipple. LC gave 10 ml via curve tip syring and gloved finger.  Baby bites and chews feeling hard gums. Baby didn't extend tongue under gloved finger. LC done tongue massage to attempt to relax suck. Mom states when the baby latches it feels like needles and she can't tolerate that. RN had brought NS mom stated she couldn't tolerate that.  Asked mom to call for assistance today for feeding w/LC.  Patient Name: Meagan [name] ZYSAY'T Date: 07/12/2019 Reason for consult: Mother's request;Nipple pain/trauma;Difficult latch;1st time breastfeeding   Maternal Data    Feeding Feeding Type: Formula Nipple Type: Slow - flow  LATCH Score       Type of Nipple: Everted at rest and after stimulation  Comfort (Breast/Nipple): Filling, red/small blisters or bruises, mild/mod discomfort(bruised)        Interventions Interventions: Coconut oil  Lactation Tools Discussed/Used Tools: Pump Breast pump type: Double-Electric Breast Pump WIC Program: Yes(hasn't been d/t covid)   Consult Status Consult Status: Follow-up Date: 07/12/19 Follow-up type: In-patient    Charyl Dancer 07/12/2019, 7:16 AM

## 2019-07-12 NOTE — Progress Notes (Signed)
Dr. Alysia Penna notified of pt's fever 101.62F and breast pain. MD says it is probably mastitis and is ordering for abx for patient to pick up. Pt is to follow up at MD office. Ok for patient to discharge. Will make pt aware.

## 2019-07-12 NOTE — Progress Notes (Signed)
At about 2122 (07/11/19), upon entrance to patient's room, she voiced to me concern that nicotine patch was no longer on arm. Patient stated it was placed on her right arm earlier in the day. I checked patient's right arm, bed, and remainder of body for the nicotine patch. I could not find one.   I offered to place another patch, but patient refused. She made a comment of "wanting to go outside to smoke". I explained to her the importance of smoking cessation, and that she needed to stay within the facility due to her current IV access. I also informed her that if she just wanted fresh air she could go to the patient patio, but that she could not smoke there. She voiced her understanding.  Patient has since been to patient patio a couple times.

## 2019-07-12 NOTE — Progress Notes (Signed)
Dr. Alysia Penna made aware that pt does not think this is mastitis since she has had it before. Pt says, "I think it has something to do with my uterus. I don't feel comfortable going home unless the doctor comes talk to me." Pt's fundus was palpated and is U1/firm. MD aware and will come see her.

## 2019-07-12 NOTE — Lactation Note (Addendum)
This note was copied from a baby's chart. Lactation Consultation Note  Patient Name: Meagan [name] [id number] Date: 07/12/2019 Reason for consult: Follow-up assessment;Mother's request;Nipple pain/trauma;Difficult latch;1st time breastfeeding;Infant weight loss  LC came back for feeding assist at 2:05 pm but mom was already feeding baby a bottle of Gerber Gentle. She told LC that she tried to put baby to breast earlier but it was too painful and she just did formula. She plans to stick with the pumping until her nipples heal. LC set her up with breast shells in order to speed up the healing process for her nipples. Instructed mom to hand express and if not enough colostrum is available, she can use coconut oil with the shells, daytime only.  Mom will probably get discharge today, reviewed discharge instructions, engorgement prevention/treatment, treatment/prevention for sore nipples and red flags on when to call baby's pediatrician. Mom's support person is her dad, GOB, he was very involved during [facility] consultation and asked questions about baby's hydration (and his own) when doing discharge instructions.  Mom reported pumping once today but that "nothing came out" explained to mom that the main purpose of pumping early on is mainly for breast stimulation and not to get volume. Encouraged her to keep pumping, ideally 8 times/24 hours until her nipples heal before she can put baby to breast comfortable again. Mom and GOB reported all questions and concerns were answered, they're both aware of LC OP services and will call contact PRN.     Maternal Data    Feeding Feeding Type: Formula Nipple Type: Slow - flow  LATCH Score                   Interventions Interventions: Breast feeding basics reviewed;Shells;Coconut oil  Lactation Tools Discussed/Used Tools: Shells;Coconut oil Shell Type: Inverted   Consult Status Consult Status: Complete Date: 07/12/19 Follow-up type:  Call as needed    Westlyn Glaza Venetia Constable 07/12/2019, 2:35 PM
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
## 2019-07-12 NOTE — Progress Notes (Signed)
Daily Postpartum Note  Admission Date: 07/10/2019 Current Date: 07/12/2019 7:32 AM  Meagan [name] is a 29 y.o. [id number] PPD#2/ VBAC/intact perineum with Liletta IUD insertion@ 67Y1  Pregnancy complicated by: Severe pre-eclampsia (?BP and neuro s/s)  Patient Active Problem List   Diagnosis Date Noted  . Gestational thrombocytopenia ([place]) 07/11/2019  . Preeclampsia 07/10/2019  . Labor and delivery, indication for care 07/10/2019  . IUD (intrauterine device) in place 07/10/2019  . Hemiplegic migraine without status migrainosus, not intractable 03/19/2019  . Rh negative state in antepartum period 01/07/2019  . Supervision of normal pregnancy 01/06/2019  . History of polyhydramnios 01/06/2019  . Previous cesarean section 03/10/2018  . 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  . Diarrhea 12/06/2015  . Hx of preeclampsia 11/16/2015  . 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 Smoker 12/16/2013  . Anxiety 06/17/2013    Overnight/24hr events:  Had some b/l lower belly intense cramping this morning, no bleeding  Subjective:  Meeting all PP goals.   Objective:    Current Vital Signs 24h Vital Sign Ranges  T 98.2 F (36.8 C) Temp  Avg: 98 F (36.7 C)  Min: 97.7 F (36.5 C)  Max: 98.3 F (36.8 C)  BP 125/78 BP  Min: 123/71  Max: 138/85  HR 88 Pulse  Avg: 88.5  Min: 85  Max: 92  RR 18 Resp  Avg: 16.8  Min: 16  Max: 18  SaO2 99 % Room Air SpO2  Avg: 99 %  Min: 97 %  Max: 100 %       24 Hour I/O Current Shift I/O  Time Ins Outs 01/09 0701 - 01/10 0700 In: 3571.2 [P.O.:1827; I.V.:1744.2] Out: 3600 [Urine:3600] No intake/output data recorded.   Patient Vitals for the past 24 hrs:  BP Temp Temp src Pulse Resp SpO2  07/12/19 0436 125/78 98.2 F (36.8 C) Oral 88 18 99 %  07/12/19 0001 134/76 98.3 F (36.8 C) Oral 86 18 99 %  07/11/19 1939 127/72 97.7 F (36.5 C) Axillary 85 18 99 %  07/11/19 1800 -- -- -- -- 16 --  07/11/19 1700 -- -- -- -- 17 --  07/11/19 1605 132/77  97.8 F (36.6 C) Oral 90 16 97 %  07/11/19 1600 -- -- -- -- 17 --  07/11/19 1500 -- -- -- -- 17 --  07/11/19 1400 -- -- -- -- 16 --  07/11/19 1325 138/85 97.9 F (36.6 C) Oral 92 16 100 %  07/11/19 1300 -- -- -- -- 16 --  07/11/19 1200 -- -- -- -- 16 --  07/11/19 1100 -- -- -- -- 18 --  07/11/19 1002 123/71 98.1 F (36.7 C) Oral 90 17 100 %  07/11/19 0800 -- -- -- -- 16 --    Physical exam: General: Well nourished, well developed female in no acute distress. Abdomen: nttp, firm fundus below the umbilicus, mildly ttp in rlq and llq areas, no peritoneal s/s.  Cardiovascular: S1, S2 normal, no murmur, rub or gallop, regular rate and rhythm Respiratory: CTAB Extremities: no clubbing, cyanosis or edema Skin: Warm and dry.   Medications: Current Facility-Administered Medications  Medication Dose Route Frequency Provider Last Rate Last Admin  . acetaminophen (TYLENOL) tablet 650 mg  650 mg Oral Q4H PRN Clarnce Flock, MD   650 mg at 07/12/19 0103  . ALPRAZolam Duanne Moron) tablet 0.5 mg  0.5 mg Oral TID PRN Clarnce Flock, MD   0.5 mg at 07/11/19 2253  . benzocaine-Menthol (DERMOPLAST) 20-0.5 % topical spray  1 application  1 application Topical PRN Venora Maples, MD   1 application at 07/11/19 1113  . coconut oil  1 application Topical PRN Venora Maples, MD   1 application at 07/11/19 1449  . witch hazel-glycerin (TUCKS) pad 1 application  1 application Topical PRN Venora Maples, MD       And  . dibucaine (NUPERCAINAL) 1 % rectal ointment 1 application  1 application Rectal PRN Venora Maples, MD      . diphenhydrAMINE (BENADRYL) capsule 25 mg  25 mg Oral Q6H PRN Venora Maples, MD      . famotidine (PEPCID) tablet 10 mg  10 mg Oral BID [place] Bing, MD   10 mg at 07/11/19 2113  . labetalol (NORMODYNE) injection 20 mg  20 mg Intravenous PRN Venora Maples, MD       And  . labetalol (NORMODYNE) injection 40 mg  40 mg Intravenous PRN Venora Maples,  MD       And  . labetalol (NORMODYNE) injection 80 mg  80 mg Intravenous PRN Venora Maples, MD       And  . hydrALAZINE (APRESOLINE) injection 10 mg  10 mg Intravenous PRN Venora Maples, MD      . magnesium hydroxide (MILK OF MAGNESIA) suspension 30 mL  30 mL Oral Q3 days PRN Venora Maples, MD      . nicotine (NICODERM CQ - dosed in mg/24 hours) patch 14 mg  14 mg Transdermal Daily Venora Maples, MD   14 mg at 07/11/19 6387  . ondansetron (ZOFRAN) tablet 4 mg  4 mg Oral Q4H PRN Venora Maples, MD       Or  . ondansetron [facility]) injection 4 mg  4 mg Intravenous Q4H PRN Venora Maples, MD      . oxyCODONE (Oxy IR/ROXICODONE) immediate release tablet 5 mg  5 mg Oral Q6H PRN [place] Bing, MD   5 mg at 07/12/19 5643  . prenatal multivitamin tablet 1 tablet  1 tablet Oral Q1200 Venora Maples, MD   1 tablet at 07/11/19 [phone]  . senna-docusate (Senokot-S) tablet 2 tablet  2 tablet Oral Q24H Venora Maples, MD   2 tablet at 07/12/19 0018  . simethicone (MYLICON) chewable tablet 80 mg  80 mg Oral PRN Venora Maples, MD   80 mg at 07/12/19 0441  . sucralfate (CARAFATE) 1 GM/10ML suspension 1 g  1 g Oral TID PRN Venora Maples, MD   1 g at 07/10/19 1636    Labs:  Recent Labs  Lab 07/10/19 0034 07/10/19 1325 07/10/19 2136  WBC 8.9 9.5 17.1*  HGB 12.6 12.8 12.4  HCT 36.6 38.6 37.3  PLT 127* 124* 125*    Recent Labs  Lab 07/10/19 0034 07/10/19 1325  NA 135 136  K 3.8 3.6  CL 104 104  CO2 21* 21*  BUN 7 6  CREATININE 0.69 0.64  CALCIUM 9.1 8.7*  PROT 5.5* 5.3*  BILITOT 0.4 0.2*  ALKPHOS 87 77  ALT 13 12  AST 11* 11*  GLUCOSE 86 116*     Radiology: none  Assessment & Plan:  Pt doing well *Pregnancy: normal PP cramping. Oxycodone given and follow up on effect. Continue with tylenol. Pt with h/o stomach ulcer so would avoid NSAIDs *SW consult pending; okay for d/c to home if okay after consult *Rh neg: s/p rhogam already *Gest  thrombocytopenia: follow level PP *Dispo: pending SW  consult today  Cornelia Copa MD Attending Center for [facility])

## 2019-07-12 NOTE — Discharge Instructions (Signed)
Hypertension During Pregnancy Hypertension is also called high blood pressure. High blood pressure means that the force of your blood moving in your body is too strong. It can cause problems for you and your baby. Different types of high blood pressure can happen during pregnancy. The types are:  High blood pressure before you got pregnant. This is called chronic hypertension.  This can continue during your pregnancy. Your doctor will want to keep checking your blood pressure. You may need medicine to keep your blood pressure under control while you are pregnant. You will need follow-up visits after you have your baby.  High blood pressure that goes up during pregnancy when it was normal before. This is called gestational hypertension. It will usually get better after you have your baby, but your doctor will need to watch your blood pressure to make sure that it is getting better.  Very high blood pressure during pregnancy. This is called preeclampsia. Very high blood pressure is an emergency that needs to be checked and treated right away.  You may develop very high blood pressure after giving birth. This is called postpartum preeclampsia. This usually occurs within 48 hours after childbirth but may occur up to 6 weeks after giving birth. This is rare. How does this affect me? If you have high blood pressure during pregnancy, you have a higher chance of developing high blood pressure:  As you get older.  If you get pregnant again. In some cases, high blood pressure during pregnancy can cause:  Stroke.  Heart attack.  Damage to the kidneys, lungs, or liver.  Preeclampsia.  Jerky movements you cannot control (convulsions or seizures).  Problems with the placenta.   What can I do to lower my risk?   Keep a healthy weight.  Eat a healthy diet.  Follow what your doctor tells you about treating any medical problems that you had before becoming pregnant. It is very important to go to  all of your doctor visits. Your doctor will check your blood pressure and make sure that your pregnancy is progressing as it should. Treatment should start early if a problem is found.   Follow these instructions at home:  Take your blood pressure 1-2 times per day. Call the office if your blood pressure is 155 or higher for the top number or 105 or higher for the bottom number.    Eating and drinking   Drink enough fluid to keep your pee (urine) pale yellow.  Avoid caffeine. Lifestyle  Do not use any products that contain nicotine or tobacco, such as cigarettes, e-cigarettes, and chewing tobacco. If you need help quitting, ask your doctor.  Do not use alcohol or drugs.  Avoid stress.  Rest and get plenty of sleep.  Regular exercise can help. Ask your doctor what kinds of exercise are best for you. General instructions  Take over-the-counter and prescription medicines only as told by your doctor.  Keep all prenatal and follow-up visits as told by your doctor. This is important. Contact a doctor if:  You have symptoms that your doctor told you to watch for, such as: ? Headaches. ? Nausea. ? Vomiting. ? Belly (abdominal) pain. ? Dizziness. ? Light-headedness. Get help right away if:  You have: ? Very bad belly pain that does not get better with treatment. ? A very bad headache that does not get better. ? Vomiting that does not get better. ? Sudden, fast weight gain. ? Sudden swelling in your hands, ankles, or face. ?   Blood in your pee. ? Blurry vision. ? Double vision. ? Shortness of breath. ? Chest pain. ? Weakness on one side of your body. ? Trouble talking. Summary  High blood pressure is also called hypertension.  High blood pressure means that the force of your blood moving in your body is too strong.  High blood pressure can cause problems for you and your baby.  Keep all follow-up visits as told by your doctor. This is important. This information is  not intended to replace advice given to you by your health care provider. Make sure you discuss any questions you have with your health care provider. Document Released: 07/21/2010 Document Revised: 10/09/2018 Document Reviewed: 07/15/2018 Elsevier Patient Education  2020 Elsevier Inc.    Vaginal Delivery, Care After Refer to this sheet in the next few weeks. These discharge instructions provide you with information on caring for yourself after delivery. Your caregiver may also give you specific instructions. Your treatment has been planned according to the most current medical practices available, but problems sometimes occur. Call your caregiver if you have any problems or questions after you go home. HOME CARE INSTRUCTIONS 1. Take over-the-counter or prescription medicines only as directed by your caregiver or pharmacist. 2. Do not drink alcohol, especially if you are breastfeeding or taking medicine to relieve pain. 3. Do not smoke tobacco. 4. Continue to use good perineal care. Good perineal care includes: 1. Wiping your perineum from back to front 2. Keeping your perineum clean. 3. You can do sitz baths twice a day, to help keep this area clean 5. Do not use tampons, douche or have sex until your caregiver says it is okay. 6. Shower only and avoid sitting in submerged water, aside from sitz baths 7. Wear a well-fitting bra that provides breast support. 8. Eat healthy foods. 9. Drink enough fluids to keep your urine clear or pale yellow. 10. Eat high-fiber foods such as whole grain cereals and breads, brown rice, beans, and fresh fruits and vegetables every day. These foods may help prevent or relieve constipation. 11. Avoid constipation with high fiber foods or medications, such as miralax or metamucil 12. Follow your caregiver's recommendations regarding resumption of activities such as climbing stairs, driving, lifting, exercising, or traveling. 13. Talk to your caregiver about  resuming sexual activities. Resumption of sexual activities is dependent upon your risk of infection, your rate of healing, and your comfort and desire to resume sexual activity. 14. Try to have someone help you with your household activities and your newborn for at least a few days after you leave the hospital. 15. Rest as much as possible. Try to rest or take a nap when your newborn is sleeping. 16. Increase your activities gradually. 17. Keep all of your scheduled postpartum appointments. It is very important to keep your scheduled follow-up appointments. At these appointments, your caregiver will be checking to make sure that you are healing physically and emotionally. SEEK MEDICAL CARE IF:   You are passing large clots from your vagina. Save any clots to show your caregiver.  You have a foul smelling discharge from your vagina.  You have trouble urinating.  You are urinating frequently.  You have pain when you urinate.  You have a change in your bowel movements.  You have increasing redness, pain, or swelling near your vaginal incision (episiotomy) or vaginal tear.  You have pus draining from your episiotomy or vaginal tear.  Your episiotomy or vaginal tear is separating.  You have painful,   hard, or reddened breasts.  You have a severe headache.  You have blurred vision or see spots.  You feel sad or depressed.  You have thoughts of hurting yourself or your newborn.  You have questions about your care, the care of your newborn, or medicines.  You are dizzy or light-headed.  You have a rash.  You have nausea or vomiting.  You were breastfeeding and have not had a menstrual period within 12 weeks after you stopped breastfeeding.  You are not breastfeeding and have not had a menstrual period by the 12th week after delivery.  You have a fever. SEEK IMMEDIATE MEDICAL CARE IF:   You have persistent pain.  You have chest pain.  You have shortness of breath.  You  faint.  You have leg pain.  You have stomach pain.  Your vaginal bleeding saturates two or more sanitary pads in 1 hour. MAKE SURE YOU:   Understand these instructions.  Will watch your condition.  Will get help right away if you are not doing well or get worse. Document Released: 06/15/2000 Document Revised: 11/02/2013 Document Reviewed: 02/13/2012 ExitCare Patient Information 2015 ExitCare, LLC. This information is not intended to replace advice given to you by your health care provider. Make sure you discuss any questions you have with your health care provider.  Sitz Bath A sitz bath is a warm water bath taken in the sitting position. The water covers only the hips and butt (buttocks). We recommend using one that fits in the toilet, to help with ease of use and cleanliness. It may be used for either healing or cleaning purposes. Sitz baths are also used to relieve pain, itching, or muscle tightening (spasms). The water may contain medicine. Moist heat will help you heal and relax.  HOME CARE  Take 3 to 4 sitz baths a day. 18. Fill the bathtub half-full with warm water. 19. Sit in the water and open the drain a little. 20. Turn on the warm water to keep the tub half-full. Keep the water running constantly. 21. Soak in the water for 15 to 20 minutes. 22. After the sitz bath, pat the affected area dry. GET HELP RIGHT AWAY IF: You get worse instead of better. Stop the sitz baths if you get worse. MAKE SURE YOU:  Understand these instructions.  Will watch your condition.  Will get help right away if you are not doing well or get worse. Document Released: 07/26/2004 Document Revised: 03/12/2012 Document Reviewed: 10/16/2010 ExitCare Patient Information 2015 ExitCare, LLC. This information is not intended to replace advice given to you by your health care provider. Make sure you discuss any questions you have with your health care provider.    

## 2019-07-12 NOTE — Progress Notes (Signed)
Pt discharged after d/c instructions and prescription given. All questions answered. Pt sent with belongings and baby. Pt ambulated at discharge in stable condition.

## 2019-07-13 ENCOUNTER — Encounter: Payer: Self-pay | Admitting: Advanced Practice Midwife

## 2019-07-13 LAB — TYPE AND SCREEN
ABO/RH(D): A NEG
Antibody Screen: POSITIVE
Unit division: 0
Unit division: 0

## 2019-07-13 LAB — BPAM RBC
Blood Product Expiration Date: 202101192359
Blood Product Expiration Date: 202101232359
Unit Type and Rh: 600
Unit Type and Rh: 600

## 2019-07-14 ENCOUNTER — Telehealth: Payer: Self-pay | Admitting: *Deleted

## 2019-07-14 NOTE — Telephone Encounter (Signed)
-----   Message from Frutoso Chase, RN sent at 07/14/2019  8:29 AM EST ----- I did not see this yesterday. Can you contact her and see how she is feeling ? ----- Message ----- From: Hermina Staggers, MD Sent: 07/12/2019   4:00 PM EST To: Kennon Portela, CMA  Please call pt on Monday to check on her. Had fever just prior to discharge Suspected mastitis. Started on Keflex.  Thanks Casimiro Needle.

## 2019-07-14 NOTE — Telephone Encounter (Signed)
LMOVM checking in on patient per Dr Alysia Penna for suspected mastitis.  Advised to call us back if she is continuing to have problems.  Is scheduled for a visit on 07/17/19.

## 2019-07-16 ENCOUNTER — Other Ambulatory Visit: Payer: Medicaid Other

## 2019-07-16 ENCOUNTER — Encounter: Payer: Medicaid Other | Admitting: Obstetrics and Gynecology

## 2019-07-17 ENCOUNTER — Telehealth: Payer: Medicaid Other | Admitting: *Deleted

## 2019-07-21 ENCOUNTER — Telehealth: Payer: Self-pay | Admitting: Obstetrics and Gynecology

## 2019-07-21 ENCOUNTER — Telehealth: Payer: Medicaid Other | Admitting: *Deleted

## 2019-07-21 NOTE — Telephone Encounter (Signed)
Called by Patient thru the Call a nurse helpline. Pt delivered 1/8, was sent home 1/10 having had fever to 101,9 the day of d/c.  Given Keflex, which has run out. Pt reports temp of 101.9 again tonight, one day after completing Keflex.  Pt to take tylenol tonight. Pt declines to go to [facility] tonight , doesn't feel that bad, and has solo care of the baby at home tonight.  Pt chooses to be seen in the office in the morning, and states that if she feels worse , will report to [facility] tonight. Note left with office to see pt in the a.m.

## 2019-07-22 ENCOUNTER — Encounter: Payer: Self-pay | Admitting: Obstetrics and Gynecology

## 2019-07-22 ENCOUNTER — Ambulatory Visit (INDEPENDENT_AMBULATORY_CARE_PROVIDER_SITE_OTHER): Payer: Medicaid Other | Admitting: Obstetrics and Gynecology

## 2019-07-22 ENCOUNTER — Other Ambulatory Visit: Payer: Self-pay

## 2019-07-22 VITALS — BP 122/80 | HR 108 | Temp 97.9°F | Ht 63.0 in | Wt 164.4 lb

## 2019-07-22 DIAGNOSIS — O864 Pyrexia of unknown origin following delivery: Secondary | ICD-10-CM | POA: Diagnosis not present

## 2019-07-22 NOTE — Progress Notes (Signed)
   Subjective:  Meagan [name] is a 29 y.o. female who presents for a 2 weeks postpartum visit   Patient concerns: Note from last night's telephone call : called by Patient thru the Call a nurse helpline. Pt delivered 1/8, was sent home 1/10 having had fever to 101,9 the day of d/c.  Given Keflex, which has run out. Pt reports temp of 101.9 again tonight, one day after completing Keflex.  Pt to take tylenol tonight. Pt declines to go to [facility] tonight , doesn't feel that bad, and has solo care of the baby at home tonight.  Pt chooses to be seen in the office in the morning, and states that if she feels worse , will report to [facility] tonight. Prenatal and intrapartum course notable for fever noted on the 10th 2 days postpartum.  Had IUD placed immediately post placental delivery   Patient is not sexually active.   The following portions of the patient's history were reviewed and updated as appropriate: allergies, current medications, past family history, past medical history, past surgical history and problem list.  Review of Systems    See Subjective, otherwise negative ROS.  Objective:  BP 122/80 (BP Location: Right Arm, Patient Position: Sitting, Cuff Size: Normal)   Pulse (!) 108   Temp 97.9 F (36.6 C) (Oral)   Ht _0  (1.6 m)   Wt 164 lb 6.4 oz (74.6 kg)   LMP 10/11/2018   Breastfeeding Yes   BMI 29.12 kg/m   General:  alert, cooperative and no distress  Breast nontender , not engorged  Lungs: clear to auscultation bilaterally  Heart:  regular rate and rhythm, S1, S2 normal, no murmur  Abdomen: soft, non-tender; bowel sounds normal; no masses,  no organomegaly   Vulva:  normal  Vagina: normal vagina with normal lochia  Cervix:  normal  Corpus: normal size, contour, position, consistency, mobility, non-tender  Adnexa:  normal adnexa no cervical motion tenderness uterus mildly uncomfortable but seems appropriate for postpartum state, no guarding no rebound           Assessment:  1.  postpartum exam.  To rule out endometritis, exam is reassuring 2. Contraception: IUD placed post delivery 3.  We will obtain labs today  Plan:  CBC with differential ESR Given the name fenugreek to assist with lactation

## 2019-07-23 ENCOUNTER — Telehealth: Payer: Self-pay | Admitting: *Deleted

## 2019-07-23 ENCOUNTER — Other Ambulatory Visit: Payer: Self-pay | Admitting: Obstetrics and Gynecology

## 2019-07-23 ENCOUNTER — Telehealth: Payer: Self-pay | Admitting: Obstetrics and Gynecology

## 2019-07-23 LAB — CBC WITH DIFFERENTIAL/PLATELET
Basophils Absolute: 0 10*3/uL (ref 0.0–0.2)
Basos: 0 %
EOS (ABSOLUTE): 0.1 10*3/uL (ref 0.0–0.4)
Eos: 1 %
Hematocrit: 40.2 % (ref 34.0–46.6)
Hemoglobin: 13.4 g/dL (ref 11.1–15.9)
Immature Grans (Abs): 0 10*3/uL (ref 0.0–0.1)
Immature Granulocytes: 1 %
Lymphocytes Absolute: 1.9 10*3/uL (ref 0.7–3.1)
Lymphs: 22 %
MCH: 31.6 pg (ref 26.6–33.0)
MCHC: 33.3 g/dL (ref 31.5–35.7)
MCV: 95 fL (ref 79–97)
Monocytes Absolute: 0.4 10*3/uL (ref 0.1–0.9)
Monocytes: 5 %
Neutrophils Absolute: 6.2 10*3/uL (ref 1.4–7.0)
Neutrophils: 71 %
Platelets: 358 10*3/uL (ref 150–450)
RBC: 4.24 x10E6/uL (ref 3.77–5.28)
RDW: 13.1 % (ref 11.7–15.4)
WBC: 8.7 10*3/uL (ref 3.4–10.8)

## 2019-07-23 LAB — SEDIMENTATION RATE: Sed Rate: 75 mm/hr — ABNORMAL HIGH (ref 0–32)

## 2019-07-23 MED ORDER — CEPHALEXIN 500 MG PO CAPS: 500.0000 mg | ORAL_CAPSULE | Freq: Three times a day (TID) | ORAL | 2 refills | Status: AC

## 2019-07-23 NOTE — Telephone Encounter (Signed)
See note. Pt refilled Keflex ESR 75

## 2019-07-23 NOTE — Telephone Encounter (Signed)
Patient called requesting results of blood work. Please call or mychart patient to discuss.

## 2019-07-23 NOTE — Progress Notes (Signed)
Pt called to review labs. She is now afebrile. ESR elevated at 75 Will complete a 7 day course continuation of the Keflex.

## 2019-07-27 ENCOUNTER — Telehealth: Payer: Self-pay | Admitting: Family Medicine

## 2019-07-27 NOTE — Telephone Encounter (Signed)
I called pt and made appt for her tomorrow MCV[facility].   Level 7 behind eyes.  She has pain, vision flashes, numbness, slurred speech.  These are normal sx that she presents with. Tylenol not helping. She sometimes gets fiorcet when this happens.  She did not want to come in due to new baby, and 4 small children (single parent).

## 2019-07-27 NOTE — Telephone Encounter (Signed)
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 Please advise.
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
## 2019-07-29 ENCOUNTER — Other Ambulatory Visit: Payer: Self-pay | Admitting: Obstetrics & Gynecology

## 2019-07-29 ENCOUNTER — Telehealth: Payer: Self-pay | Admitting: Family Medicine

## 2019-07-31 ENCOUNTER — Encounter: Payer: Self-pay | Admitting: *Deleted

## 2019-08-10 ENCOUNTER — Encounter: Payer: Self-pay | Admitting: *Deleted

## 2019-08-10 ENCOUNTER — Telehealth: Payer: Self-pay | Admitting: *Deleted

## 2019-08-10 NOTE — Telephone Encounter (Signed)
Pt requesting note to return to work

## 2019-08-10 NOTE — Telephone Encounter (Signed)
Called patient back and left message that I am returning her call.  

## 2019-08-11 ENCOUNTER — Encounter: Payer: Self-pay | Admitting: *Deleted

## 2019-08-13 ENCOUNTER — Ambulatory Visit: Payer: Medicaid Other | Admitting: Advanced Practice Midwife

## 2019-08-25 ENCOUNTER — Ambulatory Visit (INDEPENDENT_AMBULATORY_CARE_PROVIDER_SITE_OTHER): Payer: Medicaid Other | Admitting: Adult Health

## 2019-08-25 ENCOUNTER — Other Ambulatory Visit: Payer: Self-pay

## 2019-08-25 ENCOUNTER — Encounter: Payer: Self-pay | Admitting: Obstetrics and Gynecology

## 2019-08-25 VITALS — BP 125/88 | HR 100 | Ht 63.0 in | Wt 166.0 lb

## 2019-08-25 DIAGNOSIS — Z30432 Encounter for removal of intrauterine contraceptive device: Secondary | ICD-10-CM | POA: Diagnosis not present

## 2019-08-25 DIAGNOSIS — Z30011 Encounter for initial prescription of contraceptive pills: Secondary | ICD-10-CM | POA: Insufficient documentation

## 2019-08-25 MED ORDER — NORETHINDRONE 0.35 MG PO TABS: 1.0000 | ORAL_TABLET | Freq: Every day | ORAL | 11 refills | Status: DC

## 2019-08-25 NOTE — Progress Notes (Signed)
  Subjective:     Patient ID: Meagan [name], female   DOB: [birth year], 29 y.o.   MRN: [id number]  HPI Meagan [name] is a 29 year old white female,separtated, [id number] in for IUD removal and she requests POP. PCP is Kathlen Brunswick NP.   Review of Systems For IUD removal, has had cramping and spotting since insertion He feels IUD with sex  Not happy with weight ?hair bumps  Reviewed past medical,surgical, social and family history. Reviewed medications and allergies.     Objective:   Physical Exam BP 125/88 (BP Location: Left Arm, Patient Position: Sitting, Cuff Size: Normal)   Pulse 100   Ht 5\' 3"  (1.6 m)   Wt 166 lb (75.3 kg)   LMP 10/11/2018   Breastfeeding No   BMI 29.41 kg/m   Consent signed for IUD removal. Skin warm and dry.Pelvic: external genitalia is normal in appearance, has razor bumps from shaving , vagina: pink discharge without odor,urethra has no lesions or masses noted, cervix: bulbous,everted at os, +IUD strings, asked pt to cough, then deep breath, the cough again and IUD string grasped with forceps and easily removed,uterus: normal size, shape and contour, non tender, no masses felt, adnexa: no masses or tenderness noted. Bladder is non tender and no masses felt. Examination chaperoned by 12/11/2018 NP student.     Assessment:     1. Encounter for IUD removal   2. Encounter for initial prescription of contraceptive pills Start micronor today, use condoms for 1 pack Meds ordered this encounter  Medications  . norethindrone (MICRONOR) 0.35 MG tablet    Sig: Take 1 tablet (0.35 mg total) by mouth daily.    Dispense:  1 Package    Refill:  11    Order Specific Question:   Supervising Provider    Answer:   Richelle Ito [[id number]]      Plan:     Follow up in 3 months    Try [facility] Review handout on counting calories  Try not to shave for a while

## 2019-08-25 NOTE — Patient Instructions (Signed)

## 2019-08-31 ENCOUNTER — Other Ambulatory Visit: Payer: Self-pay | Admitting: Obstetrics & Gynecology

## 2019-08-31 MED ORDER — ALPRAZOLAM 0.5 MG PO TABS: ORAL_TABLET | ORAL | 0 refills | Status: DC

## 2019-09-10 ENCOUNTER — Telehealth: Payer: Self-pay | Admitting: Family Medicine

## 2019-09-10 MED ORDER — BUTALBITAL-APAP-CAFFEINE 50-325-40 MG PO TABS: 1.0000 | ORAL_TABLET | Freq: Four times a day (QID) | ORAL | 0 refills | Status: DC | PRN

## 2019-09-10 NOTE — Telephone Encounter (Signed)
Please let her know that I have called 5 tablets to her pharmacy to use sparingly. This sounds similar to her previously reported hemiplegic migraines but it is impossible to rule out other causes including stroke. Vision loss can also be a sign of MS in her age group. If any symptoms of stroke or MS including difficulty talking, walking, weakness, confusion, etc.., she needs to seek medical attention immediately. If symptoms not improved within 24 hours she should seek medical attention.

## 2019-09-10 NOTE — Telephone Encounter (Signed)
Pt has called with great concern re: migraines she is having.  Pt states her vision is going out, there is weakness in her body and she has a horrible headache.Pt also mention that her arm is going numb,  Please call

## 2019-09-10 NOTE — Telephone Encounter (Signed)
I called her back, relayed that fiorcet called in, pt is to go to ED if this does not help, or worsening sx. She verbalized understanding.

## 2019-09-10 NOTE — Addendum Note (Signed)
Addended by: Shawnie Dapper L on: 09/10/2019 03:58 PM   Modules accepted: Orders

## 2019-09-10 NOTE — Telephone Encounter (Signed)
I called pt. She is had headache for last 2 days.  She is not with hemiplegic migraine, with vision that went out, L arm numbness in arm, nose, mouth, some weakness. Tylenol and motrin has not helped.  She is 2 month post partem,  Not breast feeding.  She is taking nortriptyline, and xanax.  Received fiorcet when has this previous when pregnant.  Please advise.

## 2019-09-25 ENCOUNTER — Other Ambulatory Visit: Payer: Self-pay | Admitting: Adult Health

## 2019-09-25 MED ORDER — FLUCONAZOLE 150 MG PO TABS: ORAL_TABLET | ORAL | 1 refills | Status: DC

## 2019-09-25 NOTE — Progress Notes (Signed)
diflucan sent to [facility]

## 2019-09-29 ENCOUNTER — Other Ambulatory Visit: Payer: Self-pay | Admitting: Obstetrics & Gynecology

## 2019-09-29 MED ORDER — ALPRAZOLAM 0.5 MG PO TABS: ORAL_TABLET | ORAL | 0 refills | Status: DC

## 2019-10-19 ENCOUNTER — Ambulatory Visit: Payer: Medicaid Other | Admitting: Women's Health

## 2019-11-06 ENCOUNTER — Telehealth: Payer: Self-pay | Admitting: Obstetrics & Gynecology

## 2019-11-06 ENCOUNTER — Other Ambulatory Visit: Payer: Self-pay | Admitting: Obstetrics & Gynecology

## 2019-11-06 MED ORDER — NITROFURANTOIN MONOHYD MACRO 100 MG PO CAPS: 100.0000 mg | ORAL_CAPSULE | Freq: Two times a day (BID) | ORAL | 0 refills | Status: DC

## 2019-11-06 NOTE — Telephone Encounter (Signed)
Pt returning your call

## 2019-11-06 NOTE — Telephone Encounter (Signed)
Left message @ 11:11 am. JSY

## 2019-11-06 NOTE — Telephone Encounter (Signed)
Macrobid ordered.

## 2019-11-06 NOTE — Telephone Encounter (Addendum)
Pt is having burning with urination, and urgency at the end of urination. Pt is at work and can't come into the office. Can you order med? Thanks!! JSY

## 2019-11-06 NOTE — Telephone Encounter (Signed)
Pt states that she has a UTI and is not able to come in for an appt as she is at work. She is wanting to see if something can be called in for her.

## 2019-12-21 ENCOUNTER — Telehealth: Payer: Self-pay | Admitting: Adult Health

## 2019-12-21 ENCOUNTER — Other Ambulatory Visit: Payer: Self-pay | Admitting: Adult Health

## 2019-12-21 NOTE — Telephone Encounter (Signed)
Pt scheduled for visit tomorrow.

## 2019-12-21 NOTE — Telephone Encounter (Signed)
Pt has severe infection and is bleeding would like to know if provider can send in rx for yeast infection.

## 2019-12-22 ENCOUNTER — Ambulatory Visit: Payer: Medicaid Other | Admitting: Adult Health

## 2019-12-23 ENCOUNTER — Encounter: Payer: Self-pay | Admitting: Adult Health

## 2019-12-23 ENCOUNTER — Ambulatory Visit: Payer: Medicaid Other | Admitting: Adult Health

## 2019-12-23 VITALS — BP 128/75 | HR 109 | Ht 63.0 in | Wt 168.0 lb

## 2019-12-23 DIAGNOSIS — Z98891 History of uterine scar from previous surgery: Secondary | ICD-10-CM | POA: Diagnosis not present

## 2019-12-23 DIAGNOSIS — Z3201 Encounter for pregnancy test, result positive: Secondary | ICD-10-CM | POA: Diagnosis not present

## 2019-12-23 DIAGNOSIS — R300 Dysuria: Secondary | ICD-10-CM | POA: Diagnosis not present

## 2019-12-23 DIAGNOSIS — O3680X0 Pregnancy with inconclusive fetal viability, not applicable or unspecified: Secondary | ICD-10-CM

## 2019-12-23 DIAGNOSIS — N926 Irregular menstruation, unspecified: Secondary | ICD-10-CM | POA: Diagnosis not present

## 2019-12-23 DIAGNOSIS — O09891 Supervision of other high risk pregnancies, first trimester: Secondary | ICD-10-CM

## 2019-12-23 DIAGNOSIS — Z3A01 Less than 8 weeks gestation of pregnancy: Secondary | ICD-10-CM | POA: Insufficient documentation

## 2019-12-23 LAB — POCT URINALYSIS DIPSTICK OB
Blood, UA: NEGATIVE
Glucose, UA: NEGATIVE
Nitrite, UA: NEGATIVE
POC,PROTEIN,UA: NEGATIVE

## 2019-12-23 LAB — POCT URINE PREGNANCY: Preg Test, Ur: POSITIVE — AB

## 2019-12-23 NOTE — Progress Notes (Signed)
  Subjective:     Patient ID: Meagan [name], female   DOB: 21-Aug-1990, 29 y.o.   MRN: [id number]  HPI Meagan [name] is a 29 year old white female, separated, in for UPT, was on Micronor and missed period  And had +HPT. She takes xanax and Media [employer].  PCP is Kathlen Brunswick NP  Review of Systems +missed period,+HPT was on Micronor, has stopped +vaginal dryness,tore with sex +dysuria Reviewed past medical,surgical, social and family history. Reviewed medications and allergies.     Objective:   Physical Exam BP 128/75 (BP Location: Left Arm, Patient Position: Sitting, Cuff Size: Normal)   Pulse (!) 109   Ht 5\' 3"  (1.6 m)   Wt 168 lb (76.2 kg)   LMP 11/23/2019   Breastfeeding No   BMI 29.76 kg/m    Urine +leuks UPT +, about 4+2 weeks by LMP with EDD 08/29/20. Skin warm and dry.Pelvic: external genitalia is normal in appearance no lesions, vagina: scant discharge without odor,urethra has no lesions or masses noted, cervix:smooth and bulbous, uterus: normal size, shape and contour, non tender, no masses felt, adnexa: no masses or tenderness noted. Bladder is non tender and no masses felt. Examination chaperoned by 08/31/20 LPN  Assessment:     1. Dysuria UA C&S sent  2. Missed period  3. Less than [redacted] weeks gestation of pregnancy   4. Encounter to determine fetal viability of pregnancy, single or unspecified fetus Return in 3 weeks for dating Faith Rogue  5. Positive pregnancy test Take PNV  6. Short interval between pregnancies affecting pregnancy in first trimester, antepartum  7. History of cesarean section    Plan:     Review handout by [facility]

## 2019-12-24 ENCOUNTER — Telehealth: Payer: Self-pay | Admitting: Adult Health

## 2019-12-24 LAB — URINALYSIS, ROUTINE W REFLEX MICROSCOPIC
Bilirubin, UA: NEGATIVE
Glucose, UA: NEGATIVE
Nitrite, UA: NEGATIVE
RBC, UA: NEGATIVE
Specific Gravity, UA: 1.027 (ref 1.005–1.030)
Urobilinogen, Ur: 1 mg/dL (ref 0.2–1.0)
pH, UA: 5.5 (ref 5.0–7.5)

## 2019-12-24 LAB — MICROSCOPIC EXAMINATION
Casts: NONE SEEN /lpf
WBC, UA: >30 /hpf — AB (ref 0–5)

## 2019-12-24 NOTE — Telephone Encounter (Signed)
Pt states that she wasn't able to tell Victorino Dike about her problems because FOB was with her. She tried some metrogel did not help. She has vaginal odor, no discharge. She feels dry and has fishy odor. Intercourse is painful and she has an odor when she has sex.  She can't afford monistat, would like to try the rx version to see if it will help.

## 2019-12-24 NOTE — Telephone Encounter (Signed)
Called patient back and advised to wait for urine culture results and that she can get otc monistat 7 to treat yeast symptoms. Patient is agreeable.

## 2019-12-24 NOTE — Telephone Encounter (Signed)
Patient called in asking can someone give her a call to see if she can get prescribed some medicine because she is having an odor problem, and bleeding on the outside.  Patient stated she did not say much during her appt because her boyfriend was in the room with her.

## 2019-12-27 LAB — URINE CULTURE

## 2019-12-28 ENCOUNTER — Telehealth: Payer: Self-pay | Admitting: Adult Health

## 2019-12-28 MED ORDER — CEPHALEXIN 500 MG PO CAPS: 500.0000 mg | ORAL_CAPSULE | Freq: Three times a day (TID) | ORAL | 0 refills | Status: DC

## 2019-12-28 NOTE — Telephone Encounter (Signed)
Pt aware that urine +, showed Ecoli and strep, will rx keflex, she has taken before, has PCn allergy.

## 2020-01-13 ENCOUNTER — Ambulatory Visit (INDEPENDENT_AMBULATORY_CARE_PROVIDER_SITE_OTHER): Payer: Medicaid Other

## 2020-01-13 DIAGNOSIS — O3680X0 Pregnancy with inconclusive fetal viability, not applicable or unspecified: Secondary | ICD-10-CM | POA: Diagnosis not present

## 2020-01-13 DIAGNOSIS — Z3A01 Less than 8 weeks gestation of pregnancy: Secondary | ICD-10-CM

## 2020-01-13 NOTE — Progress Notes (Signed)
[name] 7+2 wks,single IUP with YS,CRL 10.6 mm, normal left ovary,simple right corpus luteal cyst 2.5 x 1.5 x 1.9 cm,

## 2020-01-25 ENCOUNTER — Other Ambulatory Visit: Payer: Self-pay | Admitting: Neurology

## 2020-01-26 ENCOUNTER — Encounter: Payer: Self-pay | Admitting: Obstetrics & Gynecology

## 2020-01-26 ENCOUNTER — Ambulatory Visit (INDEPENDENT_AMBULATORY_CARE_PROVIDER_SITE_OTHER): Payer: Medicaid Other | Admitting: Obstetrics & Gynecology

## 2020-01-26 VITALS — BP 129/75 | HR 102 | Ht 63.0 in | Wt 172.0 lb

## 2020-01-26 DIAGNOSIS — F419 Anxiety disorder, unspecified: Secondary | ICD-10-CM

## 2020-01-26 MED ORDER — ALPRAZOLAM 0.5 MG PO TABS: ORAL_TABLET | ORAL | 3 refills | Status: DC

## 2020-01-26 NOTE — Progress Notes (Signed)
Chief Complaint  Patient presents with  . Medication Management    pt is [redacted]w[redacted]d      29 y.o. [id number] Patient's last menstrual period was 11/23/2019. The current method of family planning pt is pregnant.  Outpatient Encounter Medications as of 01/26/2020  Medication Sig  . ALPRAZolam (XANAX) 0.5 MG tablet 3 times daily as needed for anxiety and/or sleep  . nitrofurantoin, macrocrystal-monohydrate, (MACROBID) 100 MG capsule Take 100 mg by mouth 2 (two) times daily.  . nortriptyline (PAMELOR) 25 MG capsule Take 1 capsule (25 mg total) by mouth at bedtime. APPOINTMENT NEEDED FOR FURTHER REFILLS CALL [phone]  . Prenatal Vit-Fe Fumarate-FA (PREPLUS) 27-1 MG TABS TAKE ONE TABLET BY MOUTH AT 12 NOON.  . [DISCONTINUED] ALPRAZolam (XANAX) 0.5 MG tablet 3 times daily as needed for anxiety and/or sleep  . albuterol (VENTOLIN HFA) 108 (90 Base) MCG/ACT inhaler Inhale 1-2 puffs into the lungs every 6 (six) hours as needed for wheezing or shortness of breath. (Patient not taking: Reported on 12/23/2019)  . Blood Pressure Monitor MISC For regular home bp monitoring during pregnancy (Patient not taking: Reported on 12/23/2019)  . cephALEXin (KEFLEX) 500 MG capsule Take 1 capsule (500 mg total) by mouth 3 (three) times daily.   No facility-administered encounter medications on file as of 01/26/2020.    Subjective For refill on her xanax Past Medical History:  Diagnosis Date  . Abdominal cramps 12/06/2015  . Anxiety   . Asthma    inhaler at bedside  . Daily headache   . Diarrhea 06/04/2013  . Hemiplegic migraine    blurred vision, nausea, dizziness, numbness  . Kidney stones   . Mental disorder    huffed inhalants "affected brain"  . Pyelonephritis complicating pregnancy, antepartum, first trimester 11/05/2011  . Stomach ulcer     Past Surgical History:  Procedure Laterality Date  . ANKLE SURGERY Left 09/2016   plates & screws  . CESAREAN SECTION N/A 12/19/2017   Procedure: CESAREAN  SECTION;  Surgeon: Lazaro Arms, MD;  Location: [facility] BIRTHING SUIT[facility];  Service: Obstetrics;  Laterality: N/A;    OB History    Gravida  6   Para  5   Term  4   Preterm  1   AB  0   Living  5     SAB  0   TAB  0   Ectopic  0   Multiple  0   Live Births  5        Obstetric Comments  G4=LTCS (not vertical) per op note        Allergies  Allergen Reactions  . Codeine Nausea And Vomiting and Other (See Comments)    Vertigo  . Penicillins Rash and Other (See Comments)    Unknown:  Childhood reaction.  Has patient had a PCN reaction causing immediate rash, facial/tongue/throat swelling, SOB or lightheadedness with hypotension: Unknown Has patient had a PCN reaction causing severe rash involving mucus membranes or skin necrosis: Unknown Has patient had a PCN reaction that required hospitalization No Has patient had a PCN reaction occurring within the last 10 years: No If all of the above answers are "NO", then may pro  . Sulfamethoxazole-Trimethoprim Hives    Social History   Socioeconomic History  . Marital status: Legally Separated    Spouse name: charles earles  . Number of children: 3  . Years of education: Not on file  . Highest education level: GED or equivalent  Occupational History  .  Not on file  Tobacco Use  . Smoking status: Current Every Day Smoker    Packs/day: 1.50    Years: 7.00    Pack years: 10.50    Types: Cigarettes  . Smokeless tobacco: Never Used  Vaping Use  . Vaping Use: Former  Substance and Sexual Activity  . Alcohol use: Not Currently    Alcohol/week: 0.0 standard drinks    Comment: occ; not now  . Drug use: Not Currently    Types: Other-see comments, Marijuana    Comment: huffed inhalants in past, "affected Brain"  . Sexual activity: Yes    Birth control/protection: Condom  Other Topics Concern  . Not on file  Social History Narrative   Lives at home with her children   Right handed.   Six sodas per day.   Currently  about [redacted] weeks pregnant as of 12/15/2018   Social Determinants of Health   Financial Resource Strain:   . Difficulty of Paying Living Expenses:   Food Insecurity:   . Worried About Programme researcher, broadcasting/film/video in the Last Year:   . Barista in the Last Year:   Transportation Needs:   . Freight forwarder (Medical):   Marland Kitchen Lack of Transportation (Non-Medical):   Physical Activity:   . Days of Exercise per Week:   . Minutes of Exercise per Session:   Stress:   . Feeling of Stress :   Social Connections:   . Frequency of Communication with Friends and Family:   . Frequency of Social Gatherings with Friends and Family:   . Attends Religious Services:   . Active Member of Clubs or Organizations:   . Attends Banker Meetings:   Marland Kitchen Marital Status:     Family History  Problem Relation Age of Onset  . Cancer Maternal Grandmother        breast  . Cancer Paternal Grandfather        lung cancer  . Migraines Father   . Hypertension Father   . Hypercholesterolemia Father   . Other Son        ventricular defect  . Hypertension Other     Medications:       Current Outpatient Medications:  .  ALPRAZolam (XANAX) 0.5 MG tablet, 3 times daily as needed for anxiety and/or sleep, Disp: 90 tablet, Rfl: 3 .  nitrofurantoin, macrocrystal-monohydrate, (MACROBID) 100 MG capsule, Take 100 mg by mouth 2 (two) times daily., Disp: , Rfl:  .  nortriptyline (PAMELOR) 25 MG capsule, Take 1 capsule (25 mg total) by mouth at bedtime. APPOINTMENT NEEDED FOR FURTHER REFILLS CALL [phone], Disp: 30 capsule, Rfl: 11 .  Prenatal Vit-Fe Fumarate-FA (PREPLUS) 27-1 MG TABS, TAKE ONE TABLET BY MOUTH AT 12 NOON., Disp: 30 tablet, Rfl: prn .  albuterol (VENTOLIN HFA) 108 (90 Base) MCG/ACT inhaler, Inhale 1-2 puffs into the lungs every 6 (six) hours as needed for wheezing or shortness of breath. (Patient not taking: Reported on 12/23/2019), Disp: 8 g, Rfl: 2 .  Blood Pressure Monitor MISC, For regular  home bp monitoring during pregnancy (Patient not taking: Reported on 12/23/2019), Disp: 1 each, Rfl: 0 .  cephALEXin (KEFLEX) 500 MG capsule, Take 1 capsule (500 mg total) by mouth 3 (three) times daily., Disp: 21 capsule, Rfl: 0  Objective Blood pressure (!) 129/75, pulse 102, height 5\' 3"  (1.6 m), weight 172 lb (78 kg), last menstrual period 11/23/2019, not currently breastfeeding.  Gen WDWN NAD  Pertinent ROS   Labs  or studies     Impression Diagnoses this Encounter::   ICD-10-CM   1. Anxiety  F41.9    pt is [redacted]w[redacted]d pregnant on chronic xanax for the last 8 years or so, have tried to wean off during previous pregnancies without success     Established relevant diagnosis(es):   Plan/Recommendations: Meds ordered this encounter  Medications  . ALPRAZolam (XANAX) 0.5 MG tablet    Sig: 3 times daily as needed for anxiety and/or sleep    Dispense:  90 tablet    Refill:  3    Labs or Scans Ordered: No orders of the defined types were placed in this encounter.   Management:: Refilled patient's xanax Begin PNC already scheduled  Follow up Return for keep scheduled appt.        Face to face time:  15 minutes  Greater than 50% of the visit time was spent in counseling and coordination of care with the patient.  The summary and outline of the counseling and care coordination is summarized in the note above.   All questions were answered.

## 2020-02-03 ENCOUNTER — Telehealth: Payer: Self-pay | Admitting: Family Medicine

## 2020-02-03 MED ORDER — NORTRIPTYLINE HCL 25 MG PO CAPS: 25.0000 mg | ORAL_CAPSULE | Freq: Every day | ORAL | 0 refills | Status: DC

## 2020-02-03 NOTE — Telephone Encounter (Signed)
Pt request refill nortriptyline (PAMELOR) 25 MG capsule at [facility]

## 2020-02-03 NOTE — Telephone Encounter (Signed)
Called pt and made appt for her with JM/NP for renew meds for mon 02-08-20.  30 day given to pt.

## 2020-02-08 ENCOUNTER — Telehealth: Payer: Self-pay | Admitting: Adult Health
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
## 2020-02-09 ENCOUNTER — Other Ambulatory Visit: Payer: Self-pay | Admitting: Obstetrics & Gynecology

## 2020-02-09 DIAGNOSIS — Z3682 Encounter for antenatal screening for nuchal translucency: Secondary | ICD-10-CM

## 2020-02-10 ENCOUNTER — Ambulatory Visit (INDEPENDENT_AMBULATORY_CARE_PROVIDER_SITE_OTHER): Payer: Medicaid Other | Admitting: Advanced Practice Midwife

## 2020-02-10 ENCOUNTER — Encounter: Payer: Self-pay | Admitting: Advanced Practice Midwife

## 2020-02-10 ENCOUNTER — Ambulatory Visit (INDEPENDENT_AMBULATORY_CARE_PROVIDER_SITE_OTHER): Payer: Medicaid Other

## 2020-02-10 ENCOUNTER — Ambulatory Visit: Payer: Medicaid Other | Admitting: *Deleted

## 2020-02-10 VITALS — BP 131/69 | HR 95 | Wt 172.0 lb

## 2020-02-10 DIAGNOSIS — Z3A11 11 weeks gestation of pregnancy: Secondary | ICD-10-CM

## 2020-02-10 DIAGNOSIS — Z331 Pregnant state, incidental: Secondary | ICD-10-CM | POA: Diagnosis not present

## 2020-02-10 DIAGNOSIS — Z98891 History of uterine scar from previous surgery: Secondary | ICD-10-CM | POA: Insufficient documentation

## 2020-02-10 DIAGNOSIS — Z3682 Encounter for antenatal screening for nuchal translucency: Secondary | ICD-10-CM | POA: Diagnosis not present

## 2020-02-10 DIAGNOSIS — Z3480 Encounter for supervision of other normal pregnancy, unspecified trimester: Secondary | ICD-10-CM

## 2020-02-10 DIAGNOSIS — F419 Anxiety disorder, unspecified: Secondary | ICD-10-CM

## 2020-02-10 DIAGNOSIS — Z1389 Encounter for screening for other disorder: Secondary | ICD-10-CM | POA: Diagnosis not present

## 2020-02-10 DIAGNOSIS — Z3490 Encounter for supervision of normal pregnancy, unspecified, unspecified trimester: Secondary | ICD-10-CM | POA: Insufficient documentation

## 2020-02-10 DIAGNOSIS — F17200 Nicotine dependence, unspecified, uncomplicated: Secondary | ICD-10-CM

## 2020-02-10 DIAGNOSIS — O09299 Supervision of pregnancy with other poor reproductive or obstetric history, unspecified trimester: Secondary | ICD-10-CM

## 2020-02-10 LAB — POCT URINALYSIS DIPSTICK OB
Blood, UA: NEGATIVE
Glucose, UA: NEGATIVE
Ketones, UA: NEGATIVE
Leukocytes, UA: NEGATIVE
Nitrite, UA: NEGATIVE
POC,PROTEIN,UA: NEGATIVE

## 2020-02-10 MED ORDER — BUTALBITAL-APAP-CAFFEINE 50-325-40 MG PO TABS: 1.0000 | ORAL_TABLET | Freq: Four times a day (QID) | ORAL | 0 refills | Status: DC | PRN

## 2020-02-10 MED ORDER — ASPIRIN 81 MG PO CHEW: 162.0000 mg | CHEWABLE_TABLET | Freq: Every day | ORAL | 6 refills | Status: DC

## 2020-02-10 MED ORDER — OMEPRAZOLE 20 MG PO CPDR: 20.0000 mg | DELAYED_RELEASE_CAPSULE | Freq: Every day | ORAL | 6 refills | Status: DC

## 2020-02-10 NOTE — Progress Notes (Signed)
[name] 11+2 wks,measurements c/w dates,crl 50.06 mm,fhr 166 bpm,anterior placenta gr 0,NB present,NT .8 mm,normal ovaries

## 2020-02-10 NOTE — Progress Notes (Signed)
[facility]  SUBJECTIVE:  Meagan [name] is a 29 y.o. [phone] female here for Panorama NIPT and Horizon Carrier Screening . She is [redacted]w[redacted]d pregnant.   OBJECTIVE:  Appears well, in no apparent distress  Blood work drawn from right [facility] without difficulty. 1 attempt(s).   ASSESSMENT: Pregnancy [redacted]w[redacted]d Panorama NIPT and Horizon Carrier Screening  PLAN: [facility] portal information given and instructed patient how to access results   Jobe Marker  02/10/2020 2:47 PM

## 2020-02-10 NOTE — Patient Instructions (Signed)
Meagan [name], I greatly value your feedback.  If you receive a survey following your visit with [name] today, we appreciate you taking the time to fill it out.  Thanks, Philipp Deputy, CNM   [facility] ([address] [place], [place] [address]) Entrance C, located off of [address] Free 24/7 valet parking   Nausea & Vomiting  Have saltine crackers or pretzels by your bed and eat a few bites before you raise your head out of bed in the morning  Eat small frequent meals throughout the day instead of large meals  Drink plenty of fluids throughout the day to stay hydrated, just don't drink a lot of fluids with your meals.  This can make your stomach fill up faster making you feel sick  Do not brush your teeth right after you eat  Products with real ginger are good for nausea, like ginger ale and ginger hard candy Make sure it says made with real ginger!  Sucking on sour candy like lemon heads is also good for nausea  If your prenatal vitamins make you nauseated, take them at night so you will sleep through the nausea  Sea Bands  If you feel like you need medicine for the nausea & vomiting please let us know  If you are unable to keep any fluids or food down please let us know   Constipation  Drink plenty of fluid, preferably water, throughout the day  Eat foods high in fiber such as fruits, vegetables, and grains  Exercise, such as walking, is a good way to keep your bowels regular  Drink warm fluids, especially warm prune juice, or decaf coffee  Eat a 1/2 cup of real oatmeal (not instant), 1/2 cup applesauce, and 1/2-1 cup warm prune juice every day  If needed, you may take Colace (docusate sodium) stool softener once or twice a day to help keep the stool soft.   If you still are having problems with constipation, you may take Miralax once daily as needed to help keep your bowels regular.   Home Blood Pressure Monitoring for Patients   Your  provider has recommended that you check your blood pressure (BP) at least once a week at home. If you do not have a blood pressure cuff at home, one will be provided for you. Contact your provider if you have not received your monitor within 1 week.   Helpful Tips for Accurate Home Blood Pressure Checks  . Don't smoke, exercise, or drink caffeine 30 minutes before checking your BP . Use the restroom before checking your BP (a full bladder can raise your pressure) . Relax in a comfortable upright chair . Feet on the ground . Left arm resting comfortably on a flat surface at the level of your heart . Legs uncrossed . Back supported . Sit quietly and don't talk . Place the cuff on your bare arm . Adjust snuggly, so that only two fingertips can fit between your skin and the top of the cuff . Check 2 readings separated by at least one minute . Keep a log of your BP readings . For a visual, please reference this diagram: http://ccnc.care/bpdiagram  Provider Name: [name]     Phone: [phone]  Zone 1: ALL CLEAR  Continue to monitor your symptoms:  . BP reading is less than 140 (top number) or less than 90 (bottom number)  . No right upper stomach pain . No headaches or seeing  spots . No feeling nauseated or throwing up . No swelling in face and hands  Zone 2: CAUTION Call your doctor's office for any of the following:  . BP reading is greater than 140 (top number) or greater than 90 (bottom number)  . Stomach pain under your ribs in the middle or right side . Headaches or seeing spots . Feeling nauseated or throwing up . Swelling in face and hands  Zone 3: EMERGENCY  Seek immediate medical care if you have any of the following:  . BP reading is greater than160 (top number) or greater than 110 (bottom number) . Severe headaches not improving with Tylenol . Serious difficulty catching your breath . Any worsening symptoms from Zone 2    First Trimester of Pregnancy The  first trimester of pregnancy is from week 1 until the end of week 12 (months 1 through 3). A week after a sperm fertilizes an egg, the egg will implant on the wall of the uterus. This embryo will begin to develop into a baby. Genes from you and your partner are forming the baby. The female genes determine whether the baby is a boy or a girl. At 6-8 weeks, the eyes and face are formed, and the heartbeat can be seen on ultrasound. At the end of 12 weeks, all the baby's organs are formed.  Now that you are pregnant, you will want to do everything you can to have a healthy baby. Two of the most important things are to get good prenatal care and to follow your health care provider's instructions. Prenatal care is all the medical care you receive before the baby's birth. This care will help prevent, find, and treat any problems during the pregnancy and childbirth. BODY CHANGES Your body goes through many changes during pregnancy. The changes vary from woman to woman.   You may gain or lose a couple of pounds at first.  You may feel sick to your stomach (nauseous) and throw up (vomit). If the vomiting is uncontrollable, call your health care provider.  You may tire easily.  You may develop headaches that can be relieved by medicines approved by your health care provider.  You may urinate more often. Painful urination may mean you have a bladder infection.  You may develop heartburn as a result of your pregnancy.  You may develop constipation because certain hormones are causing the muscles that push waste through your intestines to slow down.  You may develop hemorrhoids or swollen, bulging veins (varicose veins).  Your breasts may begin to grow larger and become tender. Your nipples may stick out more, and the tissue that surrounds them (areola) may become darker.  Your gums may bleed and may be sensitive to brushing and flossing.  Dark spots or blotches (chloasma, mask of pregnancy) may develop on  your face. This will likely fade after the baby is born.  Your menstrual periods will stop.  You may have a loss of appetite.  You may develop cravings for certain kinds of food.  You may have changes in your emotions from day to day, such as being excited to be pregnant or being concerned that something may go wrong with the pregnancy and baby.  You may have more vivid and strange dreams.  You may have changes in your hair. These can include thickening of your hair, rapid growth, and changes in texture. Some women also have hair loss during or after pregnancy, or hair that feels dry or thin. Your hair  will most likely return to normal after your baby is born. WHAT TO EXPECT AT YOUR PRENATAL VISITS During a routine prenatal visit:  You will be weighed to make sure you and the baby are growing normally.  Your blood pressure will be taken.  Your abdomen will be measured to track your baby's growth.  The fetal heartbeat will be listened to starting around week 10 or 12 of your pregnancy.  Test results from any previous visits will be discussed. Your health care provider may ask you:  How you are feeling.  If you are feeling the baby move.  If you have had any abnormal symptoms, such as leaking fluid, bleeding, severe headaches, or abdominal cramping.  If you have any questions. Other tests that may be performed during your first trimester include:  Blood tests to find your blood type and to check for the presence of any previous infections. They will also be used to check for low iron levels (anemia) and Rh antibodies. Later in the pregnancy, blood tests for diabetes will be done along with other tests if problems develop.  Urine tests to check for infections, diabetes, or protein in the urine.  An ultrasound to confirm the proper growth and development of the baby.  An amniocentesis to check for possible genetic problems.  Fetal screens for spina bifida and Down  syndrome.  You may need other tests to make sure you and the baby are doing well. HOME CARE INSTRUCTIONS  Medicines  Follow your health care provider's instructions regarding medicine use. Specific medicines may be either safe or unsafe to take during pregnancy.  Take your prenatal vitamins as directed.  If you develop constipation, try taking a stool softener if your health care provider approves. Diet  Eat regular, well-balanced meals. Choose a variety of foods, such as meat or vegetable-based protein, fish, milk and low-fat dairy products, vegetables, fruits, and whole grain breads and cereals. Your health care provider will help you determine the amount of weight gain that is right for you.  Avoid raw meat and uncooked cheese. These carry germs that can cause birth defects in the baby.  Eating four or five small meals rather than three large meals a day may help relieve nausea and vomiting. If you start to feel nauseous, eating a few soda crackers can be helpful. Drinking liquids between meals instead of during meals also seems to help nausea and vomiting.  If you develop constipation, eat more high-fiber foods, such as fresh vegetables or fruit and whole grains. Drink enough fluids to keep your urine clear or pale yellow. Activity and Exercise  Exercise only as directed by your health care provider. Exercising will help you:  Control your weight.  Stay in shape.  Be prepared for labor and delivery.  Experiencing pain or cramping in the lower abdomen or low back is a good sign that you should stop exercising. Check with your health care provider before continuing normal exercises.  Try to avoid standing for long periods of time. Move your legs often if you must stand in one place for a long time.  Avoid heavy lifting.  Wear low-heeled shoes, and practice good posture.  You may continue to have sex unless your health care provider directs you otherwise. Relief of Pain or  Discomfort  Wear a good support bra for breast tenderness.    Take warm sitz baths to soothe any pain or discomfort caused by hemorrhoids. Use hemorrhoid cream if your health care provider approves.  Rest with your legs elevated if you have leg cramps or low back pain.  If you develop varicose veins in your legs, wear support hose. Elevate your feet for 15 minutes, 3-4 times a day. Limit salt in your diet. Prenatal Care  Schedule your prenatal visits by the twelfth week of pregnancy. They are usually scheduled monthly at first, then more often in the last 2 months before delivery.  Write down your questions. Take them to your prenatal visits.  Keep all your prenatal visits as directed by your health care provider. Safety  Wear your seat belt at all times when driving.  Make a list of emergency phone numbers, including numbers for family, friends, the hospital, and police and fire departments. General Tips  Ask your health care provider for a referral to a local prenatal education class. Begin classes no later than at the beginning of month 6 of your pregnancy.  Ask for help if you have counseling or nutritional needs during pregnancy. Your health care provider can offer advice or refer you to specialists for help with various needs.  Do not use hot tubs, steam rooms, or saunas.  Do not douche or use tampons or scented sanitary pads.  Do not cross your legs for long periods of time.  Avoid cat litter boxes and soil used by cats. These carry germs that can cause birth defects in the baby and possibly loss of the fetus by miscarriage or stillbirth.  Avoid all smoking, herbs, alcohol, and medicines not prescribed by your health care provider. Chemicals in these affect the formation and growth of the baby.  Schedule a dentist appointment. At home, brush your teeth with a soft toothbrush and be gentle when you floss. SEEK MEDICAL CARE IF:   You have dizziness.  You have mild  pelvic cramps, pelvic pressure, or nagging pain in the abdominal area.  You have persistent nausea, vomiting, or diarrhea.  You have a bad smelling vaginal discharge.  You have pain with urination.  You notice increased swelling in your face, hands, legs, or ankles. SEEK IMMEDIATE MEDICAL CARE IF:   You have a fever.  You are leaking fluid from your vagina.  You have spotting or bleeding from your vagina.  You have severe abdominal cramping or pain.  You have rapid weight gain or loss.  You vomit blood or material that looks like coffee grounds.  You are exposed to [name] measles and have never had them.  You are exposed to fifth disease or chickenpox.  You develop a severe headache.  You have shortness of breath.  You have any kind of trauma, such as from a fall or a car accident. Document Released: 06/12/2001 Document Revised: 11/02/2013 Document Reviewed: 04/28/2013 [facility] Patient Information 2015 [place], [place]. This information is not intended to replace advice given to you by your health care provider. Make sure you discuss any questions you have with your health care provider.

## 2020-02-10 NOTE — Progress Notes (Signed)
INITIAL OBSTETRICAL VISIT Patient name: Meagan [name] MRN [id number]  Date of birth: [birth year] Chief Complaint:   Initial Prenatal Visit (nt/it, headaches)  History of Present Illness:   Meagan [name] is a 29 y.o. [phone] Caucasian female at [redacted]w[redacted]d by LMP c/w u/s at 7 weeks with an Estimated Date of Delivery: 08/29/20 being seen today for her initial obstetrical visit.   Her obstetrical history is significant for pre-eclampsia, smoker and C/S (abruption)>VBAC.   Today she reports H/A x past few days, Tylenol doesn't relieve it; also with heartburn.  Depression screen [facility] 2/9 02/10/2020 01/06/2019 11/13/2018 06/26/2017  Decreased Interest 1 0 0 2  Down, Depressed, Hopeless 2 0 0 2  PHQ - 2 Score 3 0 0 4  Altered sleeping 2 0 - 1  Tired, decreased energy 2 0 - 3  Change in appetite 2 0 - 0  Feeling bad or failure about yourself  0 0 - 1  Trouble concentrating 1 0 - 3  Moving slowly or fidgety/restless 0 0 - 3  Suicidal thoughts 0 0 - 1  PHQ-9 Score 10 0 - 16  Difficult doing work/chores - - - Somewhat difficult  Some recent data might be hidden    Patient's last menstrual period was 11/23/2019. Last pap May 2017 (declines today; wants to get at next visit). Results were: normal Review of Systems:   Pertinent items are noted in HPI Denies cramping/contractions, leakage of fluid, vaginal bleeding, abnormal vaginal discharge w/ itching/odor/irritation, headaches, visual changes, shortness of breath, chest pain, abdominal pain, severe nausea/vomiting, or problems with urination or bowel movements unless otherwise stated above.  Pertinent History Reviewed:  Reviewed past medical,surgical, social, obstetrical and family history.  Reviewed problem list, medications and allergies. OB History  Gravida Para Term Preterm AB Living  6 5 4 1  0 5  SAB TAB Ectopic Multiple Live Births  0 0 0 0 5    # Outcome Date GA Lbr Len/2nd Weight Sex Delivery Anes PTL Lv  6 Current           5  Term 07/10/19 [redacted]w[redacted]d 03:09 / 00:03 7 lb 4.4 oz (3.3 kg) M VBAC EPI  LIV  4 Preterm 12/19/17 [redacted]w[redacted]d  4 lb 15.7 oz (2.26 kg) F CS-LVertical EPI  LIV     Complications: Abruptio Placenta, Polyhydramnios  3 Term 06/18/16 [redacted]w[redacted]d 04:13 / 00:04 8 lb 3 oz (3.715 kg) F Vag-Spont EPI N LIV     Complications: Polyhydramnios  2 Term 02/13/14 [redacted]w[redacted]d 03:14 / 00:05 6 lb 12.1 oz (3.065 kg) F Vag-Spont EPI N LIV     Complications: Pre-eclampsia  1 Term 05/30/12 [redacted]w[redacted]d 15:31 / 04:14 7 lb 14 oz (3.572 kg) M Vag-Spont EPI N LIV     Complications: Preeclampsia    Obstetric Comments  G4=LTCS (not vertical) per op note   Physical Assessment:   Vitals:   02/10/20 1428  BP: 131/69  Pulse: 95  Weight: 172 lb (78 kg)  Body mass index is 30.47 kg/m.       Physical Examination:  General appearance - well appearing, and in no distress  Mental status - alert, oriented to person, place, and time  Psych:  She has a normal mood and affect  Skin - warm and dry, normal color, no suspicious lesions noted  Chest - effort normal, all lung fields clear to auscultation bilaterally  Heart - normal rate and regular rhythm  Abdomen - soft, nontender  Extremities:  No swelling or  varicosities noted  Pelvic - VULVA: normal appearing vulva with no masses, tenderness or lesions  VAGINA: normal appearing vagina with normal color and discharge, no lesions  CERVIX: normal appearing cervix without discharge or lesions, no CMT  Thin prep pap is not done  TODAY'S NT [name] 11+2 wks,measurements c/w dates,crl 50.06 mm,fhr 166 bpm,anterior placenta gr 0,NB present,NT .9 mm,normal ovaries  Results for orders placed or performed in visit on 02/10/20 (from the past 24 hour(s))  POC Urinalysis Dipstick OB   Collection Time: 02/10/20  2:46 PM  Result Value Ref Range   Color, UA     Clarity, UA     Glucose, UA Negative Negative   Bilirubin, UA     Ketones, UA neg    Spec Grav, UA     Blood, UA neg    pH, UA     POC,PROTEIN,UA Negative  Negative, Trace, Small (1+), Moderate (2+), Large (3+), 4+   Urobilinogen, UA     Nitrite, UA neg    Leukocytes, UA Negative Negative   Appearance     Odor      Assessment & Plan:  1) Low-Risk Pregnancy [id number] at [redacted]w[redacted]d with an Estimated Date of Delivery: 08/29/20   2) Initial OB visit  3) Hx pre-e, baseline labs today, rx bASA 162mg  qd  4) Hx pLTC/S for abruption at 33wks>VBAC, plans TOLAC this time  5) Anxiety, takes Xanax 0.5mg  2-3x/day & Pamelor  6) Rh neg, Rhogam at 28wks  7) Hemiplegic migraine, rx Fioricet  Meds:  Meds ordered this encounter  Medications  . aspirin 81 MG chewable tablet    Sig: Chew 2 tablets (162 mg total) by mouth daily.    Dispense:  60 tablet    Refill:  6    Order Specific Question:   Supervising Provider    Answer:   [[id number]]  . butalbital-acetaminophen-caffeine (FIORICET) 50-325-40 MG tablet    Sig: Take 1-2 tablets by mouth every 6 (six) hours as needed for headache.    Dispense:  20 tablet    Refill:  0    Order Specific Question:   Supervising Provider    Answer:   Tilda Burrow [[id number]]  . omeprazole (PRILOSEC) 20 MG capsule    Sig: Take 1 capsule (20 mg total) by mouth daily.    Dispense:  30 capsule    Refill:  6    Order Specific Question:   Supervising Provider    Answer:   Tilda Burrow [phone]    Initial labs obtained Continue prenatal vitamins Reviewed n/v relief measures and warning s/s to report Reviewed recommended weight gain based on pre-gravid BMI Encouraged well-balanced diet Genetic & carrier screening discussed: requests Panorama, NT/IT and Horizon 14  Ultrasound discussed; fetal survey: requested [facility]NC completed> form faxed if has or is planning to apply for medicaid The nature of [facility] [[id number]] with multiple MDs and other Advanced Practice Providers was explained to patient; also emphasized that fellows, residents, and students are part of our team. Ordered home bp  cuff. Check bp weekly, let [employer] know if >140/90.   Indications for ASA therapy (per uptodate) One of the following: H/O preeclampsia, especially early onset/adverse outcome Yes   Follow-up: Return in about 4 weeks (around 03/09/2020) for LROB, in person, 2nd IT.   Orders Placed This Encounter  Procedures  . Urine Culture  . GC/Chlamydia Probe Amp  . Integrated 1  . Genetic Screening  .  Comprehensive metabolic panel  . Protein / creatinine ratio, urine  . Pain Management Screening Profile (10S)  . CBC/D/Plt+RPR+Rh+ABO+Rub Ab...  . POC Urinalysis Dipstick OB    [name] [facility] 02/10/2020 4:58 PM

## 2020-02-11 LAB — PMP SCREEN PROFILE (10S), URINE
Amphetamine Scrn, Ur: NEGATIVE ng/mL
BARBITURATE SCREEN URINE: NEGATIVE ng/mL
BENZODIAZEPINE SCREEN, URINE: POSITIVE ng/mL — AB
CANNABINOIDS UR QL SCN: POSITIVE ng/mL — AB
Cocaine (Metab) Scrn, Ur: NEGATIVE ng/mL
Creatinine(Crt), U: 213.4 mg/dL (ref 20.0–300.0)
Methadone Screen, Urine: NEGATIVE ng/mL
OXYCODONE+OXYMORPHONE UR QL SCN: NEGATIVE ng/mL
Opiate Scrn, Ur: NEGATIVE ng/mL
Ph of Urine: 6.2 (ref 4.5–8.9)
Phencyclidine Qn, Ur: NEGATIVE ng/mL
Propoxyphene Scrn, Ur: NEGATIVE ng/mL

## 2020-02-12 ENCOUNTER — Encounter: Payer: Self-pay | Admitting: Advanced Practice Midwife

## 2020-02-12 DIAGNOSIS — F1290 Cannabis use, unspecified, uncomplicated: Secondary | ICD-10-CM | POA: Insufficient documentation

## 2020-02-12 LAB — CBC/D/PLT+RPR+RH+ABO+RUB AB...
Antibody Screen: NEGATIVE
Basophils Absolute: 0 10*3/uL (ref 0.0–0.2)
Basos: 1 %
EOS (ABSOLUTE): 0.1 10*3/uL (ref 0.0–0.4)
Eos: 2 %
HCV Ab: <0.1 s/co ratio (ref 0.0–0.9)
HIV Screen 4th Generation wRfx: NONREACTIVE
Hematocrit: 35.8 % (ref 34.0–46.6)
Hemoglobin: 12.5 g/dL (ref 11.1–15.9)
Hepatitis B Surface Ag: NEGATIVE
Immature Grans (Abs): 0 10*3/uL (ref 0.0–0.1)
Immature Granulocytes: 0 %
Lymphocytes Absolute: 1.1 10*3/uL (ref 0.7–3.1)
Lymphs: 27 %
MCH: 30.9 pg (ref 26.6–33.0)
MCHC: 34.9 g/dL (ref 31.5–35.7)
MCV: 89 fL (ref 79–97)
Monocytes Absolute: 0.3 10*3/uL (ref 0.1–0.9)
Monocytes: 7 %
Neutrophils Absolute: 2.6 10*3/uL (ref 1.4–7.0)
Neutrophils: 63 %
Platelets: 168 10*3/uL (ref 150–450)
RBC: 4.04 x10E6/uL (ref 3.77–5.28)
RDW: 13.5 % (ref 11.7–15.4)
RPR Ser Ql: NONREACTIVE
Rh Factor: NEGATIVE
Rubella Antibodies, IGG: 1.5 index (ref >0.99)
WBC: 4.1 10*3/uL (ref 3.4–10.8)

## 2020-02-12 LAB — COMPREHENSIVE METABOLIC PANEL
ALT: 17 IU/L (ref 0–32)
AST: 10 IU/L (ref 0–40)
Albumin/Globulin Ratio: 1.7 (ref 1.2–2.2)
Albumin: 3.8 g/dL — ABNORMAL LOW (ref 3.9–5.0)
Alkaline Phosphatase: 69 IU/L (ref 48–121)
BUN/Creatinine Ratio: 11 (ref 9–23)
BUN: 7 mg/dL (ref 6–20)
Bilirubin Total: <0.2 mg/dL (ref 0.0–1.2)
CO2: 22 mmol/L (ref 20–29)
Calcium: 8.9 mg/dL (ref 8.7–10.2)
Chloride: 105 mmol/L (ref 96–106)
Creatinine, Ser: 0.64 mg/dL (ref 0.57–1.00)
GFR calc Af Amer: 142 mL/min/{1.73_m2} (ref >59)
GFR calc non Af Amer: 124 mL/min/{1.73_m2} (ref >59)
Globulin, Total: 2.3 g/dL (ref 1.5–4.5)
Glucose: 80 mg/dL (ref 65–99)
Potassium: 4.1 mmol/L (ref 3.5–5.2)
Sodium: 138 mmol/L (ref 134–144)
Total Protein: 6.1 g/dL (ref 6.0–8.5)

## 2020-02-12 LAB — INTEGRATED 1
Crown Rump Length: 50.1 mm
Gest. Age on Collection Date: 11.6 weeks
Maternal Age at EDD: 27.3 yr
Nuchal Translucency (NT): 0.9 mm
Number of Fetuses: 1
PAPP-A Value: 243.4 ng/mL
Weight: 172 [lb_av]

## 2020-02-12 LAB — GC/CHLAMYDIA PROBE AMP
Chlamydia trachomatis, NAA: NEGATIVE
Neisseria Gonorrhoeae by PCR: NEGATIVE

## 2020-02-12 LAB — URINE CULTURE

## 2020-02-12 LAB — PROTEIN / CREATININE RATIO, URINE
Creatinine, Urine: 195.8 mg/dL
Protein, Ur: 15 mg/dL
Protein/Creat Ratio: 77 mg/g creat (ref 0–200)

## 2020-02-12 LAB — HCV INTERPRETATION

## 2020-02-15 ENCOUNTER — Telehealth: Payer: Self-pay | Admitting: Women's Health

## 2020-02-15 ENCOUNTER — Telehealth: Payer: Self-pay | Admitting: *Deleted

## 2020-02-15 ENCOUNTER — Encounter: Payer: Self-pay | Admitting: *Deleted

## 2020-02-15 NOTE — Telephone Encounter (Signed)
Patient called stating that she did some blood work the last time she was here and she states that she can not see the results on her Mychart. Please contact pt

## 2020-02-15 NOTE — Telephone Encounter (Signed)
Unable to leave message. VM full. Will send mychart message.

## 2020-02-22 LAB — RPR: RPR Ser Ql: NONREACTIVE — AB

## 2020-03-04 ENCOUNTER — Other Ambulatory Visit (INDEPENDENT_AMBULATORY_CARE_PROVIDER_SITE_OTHER): Payer: Medicaid Other | Admitting: *Deleted

## 2020-03-04 VITALS — BP 116/67 | HR 94 | Ht 63.0 in | Wt 169.5 lb

## 2020-03-04 DIAGNOSIS — Z3A14 14 weeks gestation of pregnancy: Secondary | ICD-10-CM

## 2020-03-04 DIAGNOSIS — Z331 Pregnant state, incidental: Secondary | ICD-10-CM

## 2020-03-04 DIAGNOSIS — R8290 Unspecified abnormal findings in urine: Secondary | ICD-10-CM

## 2020-03-04 DIAGNOSIS — Z1389 Encounter for screening for other disorder: Secondary | ICD-10-CM | POA: Diagnosis not present

## 2020-03-04 LAB — POCT URINALYSIS DIPSTICK OB
Glucose, UA: NEGATIVE
Ketones, UA: NEGATIVE
Nitrite, UA: NEGATIVE
POC,PROTEIN,UA: NEGATIVE

## 2020-03-04 MED ORDER — BUTALBITAL-APAP-CAFFEINE 50-325-40 MG PO TABS: 1.0000 | ORAL_TABLET | Freq: Four times a day (QID) | ORAL | 0 refills | Status: DC | PRN

## 2020-03-04 NOTE — Progress Notes (Addendum)
   NURSE VISIT- UTI SYMPTOMS   SUBJECTIVE:  Meagan [name] is a 29 y.o. [phone] female here for UTI symptoms. She is [redacted]w[redacted]d pregnant. She reports odor with urine .  OBJECTIVE:  BP 116/67 (BP Location: Left Arm, Patient Position: Sitting, Cuff Size: Normal)   Pulse 94   Ht 5\' 3"  (1.6 m)   Wt 169 lb 8 oz (76.9 kg)   LMP 11/23/2019   BMI 30.03 kg/m   Appears well, in no apparent distress  Results for orders placed or performed in visit on 03/04/20 (from the past 24 hour(s))  POC Urinalysis Dipstick OB   Collection Time: 03/04/20 12:11 PM  Result Value Ref Range   Color, UA     Clarity, UA     Glucose, UA Negative Negative   Bilirubin, UA     Ketones, UA neg    Spec Grav, UA     Blood, UA trace    pH, UA     POC,PROTEIN,UA Negative Negative, Trace, Small (1+), Moderate (2+), Large (3+), 4+   Urobilinogen, UA     Nitrite, UA neg    Leukocytes, UA Trace (A) Negative   Appearance     Odor      ASSESSMENT: Pregnancy [redacted]w[redacted]d with UTI symptoms and negative nitrites  PLAN: Note routed to [redacted]w[redacted]d, CNM, [facility]   Rx sent by provider today: No Urine culture sent Call or return to clinic prn if these symptoms worsen or fail to improve as anticipated. Follow-up: as scheduled   [facility]  03/04/2020 12:37 PM   Chart reviewed for nurse visit. Agree with plan of care. Fioricet refilled 05/04/2020, [name] 03/04/2020 1:01 PM

## 2020-03-04 NOTE — Addendum Note (Signed)
Addended by: Shawna Clamp R on: 03/04/2020 01:03 PM   Modules accepted: Orders

## 2020-03-06 LAB — URINE CULTURE: Organism ID, Bacteria: NO GROWTH

## 2020-03-09 ENCOUNTER — Telehealth: Payer: Self-pay | Admitting: Family Medicine

## 2020-03-09 ENCOUNTER — Other Ambulatory Visit (HOSPITAL_COMMUNITY)
Admission: RE | Admit: 2020-03-09 | Discharge: 2020-03-09 | Disposition: A | Discharge status: Home / Self Care | Payer: Medicaid Other | Source: Ambulatory Visit | Attending: Advanced Practice Midwife | Admitting: Advanced Practice Midwife

## 2020-03-09 ENCOUNTER — Ambulatory Visit (INDEPENDENT_AMBULATORY_CARE_PROVIDER_SITE_OTHER): Payer: Medicaid Other | Admitting: Advanced Practice Midwife

## 2020-03-09 ENCOUNTER — Telehealth: Payer: Self-pay | Admitting: Women's Health

## 2020-03-09 ENCOUNTER — Telehealth: Payer: Self-pay | Admitting: *Deleted

## 2020-03-09 ENCOUNTER — Encounter: Payer: Self-pay | Admitting: Advanced Practice Midwife

## 2020-03-09 VITALS — BP 110/65 | HR 89 | Wt 168.0 lb

## 2020-03-09 DIAGNOSIS — Z98891 History of uterine scar from previous surgery: Secondary | ICD-10-CM

## 2020-03-09 DIAGNOSIS — Z1389 Encounter for screening for other disorder: Secondary | ICD-10-CM

## 2020-03-09 DIAGNOSIS — Z3480 Encounter for supervision of other normal pregnancy, unspecified trimester: Secondary | ICD-10-CM

## 2020-03-09 DIAGNOSIS — Z6791 Unspecified blood type, Rh negative: Secondary | ICD-10-CM

## 2020-03-09 DIAGNOSIS — Z331 Pregnant state, incidental: Secondary | ICD-10-CM

## 2020-03-09 DIAGNOSIS — Z3A15 15 weeks gestation of pregnancy: Secondary | ICD-10-CM

## 2020-03-09 DIAGNOSIS — O26899 Other specified pregnancy related conditions, unspecified trimester: Secondary | ICD-10-CM

## 2020-03-09 LAB — POCT URINALYSIS DIPSTICK OB
Blood, UA: NEGATIVE
Glucose, UA: NEGATIVE
Ketones, UA: NEGATIVE
Leukocytes, UA: NEGATIVE
Nitrite, UA: NEGATIVE
POC,PROTEIN,UA: NEGATIVE

## 2020-03-09 NOTE — Telephone Encounter (Signed)
Pt called stating that she is having a Hemiplegic migraine and is wanting to speak to a nurse. Please advise.

## 2020-03-09 NOTE — Telephone Encounter (Signed)
Sounds great. She should continue Fioricet sparingly for abortive therapy and nortriptyline 25mg  daily. I do not think Botox is advised during pregnancy. I will be happy to discuss with Dr and reach out with any other recommendations.

## 2020-03-09 NOTE — Telephone Encounter (Signed)
Pt is having vision issues she would like to discuss / pt is 15 weeks

## 2020-03-09 NOTE — Patient Instructions (Signed)
Meagan [name], I greatly value your feedback.  If you receive a survey following your visit with [name] today, we appreciate you taking the time to fill it out.  Thanks, Philipp Deputy CNM  [facility] ([address] [place], [place] [address]) Entrance C, located off of [address] valet parking  Go to [email or web address] to register for FREE online childbirth classes  [place] Pediatricians/Family Doctors:  [facility] [phone]            [facility] [phone]                 [facility] [phone] (usually not accepting new patients unless you have family there already, you are always welcome to call and ask)       [facility] Department [phone]       [facility] Pediatricians/Family Doctors:   [name]: [phone]  [name]: [phone]  [name]: [phone]  [facility] Doctors:   [name]: [phone]   [facility]: [phone]  [facility] Doctors:  [facility]: [phone]    Home Blood Pressure Monitoring for Patients   Your provider has recommended that you check your blood pressure (BP) at least once a week at home. If you do not have a blood pressure cuff at home, one will be provided for you. Contact your provider if you have not received your monitor within 1 week.   Helpful Tips for Accurate Home Blood Pressure Checks  . Don't smoke, exercise, or drink caffeine 30 minutes before checking your BP . Use the restroom before checking your BP (a full bladder can raise your pressure) . Relax in a comfortable upright chair . Feet on the ground . Left arm resting comfortably on a flat surface at the level of your heart . Legs uncrossed . Back supported . Sit quietly and don't talk . Place the cuff on your bare arm . Adjust snuggly, so that  only two fingertips can fit between your skin and the top of the cuff . Check 2 readings separated by at least one minute . Keep a log of your BP readings . For a visual, please reference this diagram: http://ccnc.care/bpdiagram  Provider Name: [name]     Phone: [phone]  Zone 1: ALL CLEAR  Continue to monitor your symptoms:  . BP reading is less than 140 (top number) or less than 90 (bottom number)  . No right upper stomach pain . No headaches or seeing spots . No feeling nauseated or throwing up . No swelling in face and hands  Zone 2: CAUTION Call your doctor's office for any of the following:  . BP reading is greater than 140 (top number) or greater than 90 (bottom number)  . Stomach pain under your ribs in the middle or right side . Headaches or seeing spots . Feeling nauseated or throwing up . Swelling in face and hands  Zone 3: EMERGENCY  Seek immediate medical care if you have any of the following:  . BP reading is greater than160 (top number) or greater than 110 (bottom number) . Severe headaches not improving with Tylenol . Serious difficulty catching your breath . Any worsening symptoms from Zone 2     Second Trimester of Pregnancy The second trimester is from week 14 through week 27 (months 4 through 6). The second trimester is often a time when you feel your best.  Your body has adjusted to being pregnant, and you begin to feel better physically. Usually, morning sickness has lessened or quit completely, you may have more energy, and you may have an increase in appetite. The second trimester is also a time when the fetus is growing rapidly. At the end of the sixth month, the fetus is about 9 inches long and weighs about 1 pounds. You will likely begin to feel the baby move (quickening) between 16 and 20 weeks of pregnancy. Body changes during your second trimester Your body continues to go through many changes during your second trimester. The changes  vary from woman to woman.  Your weight will continue to increase. You will notice your lower abdomen bulging out.  You may begin to get stretch marks on your hips, abdomen, and breasts.  You may develop headaches that can be relieved by medicines. The medicines should be approved by your health care provider.  You may urinate more often because the fetus is pressing on your bladder.  You may develop or continue to have heartburn as a result of your pregnancy.  You may develop constipation because certain hormones are causing the muscles that push waste through your intestines to slow down.  You may develop hemorrhoids or swollen, bulging veins (varicose veins).  You may have back pain. This is caused by: ? Weight gain. ? Pregnancy hormones that are relaxing the joints in your pelvis. ? A shift in weight and the muscles that support your balance.  Your breasts will continue to grow and they will continue to become tender.  Your gums may bleed and may be sensitive to brushing and flossing.  Dark spots or blotches (chloasma, mask of pregnancy) may develop on your face. This will likely fade after the baby is born.  A dark line from your belly button to the pubic area (linea nigra) may appear. This will likely fade after the baby is born.  You may have changes in your hair. These can include thickening of your hair, rapid growth, and changes in texture. Some women also have hair loss during or after pregnancy, or hair that feels dry or thin. Your hair will most likely return to normal after your baby is born.  What to expect at prenatal visits During a routine prenatal visit:  You will be weighed to make sure you and the fetus are growing normally.  Your blood pressure will be taken.  Your abdomen will be measured to track your baby's growth.  The fetal heartbeat will be listened to.  Any test results from the previous visit will be discussed.  Your health care provider may  ask you:  How you are feeling.  If you are feeling the baby move.  If you have had any abnormal symptoms, such as leaking fluid, bleeding, severe headaches, or abdominal cramping.  If you are using any tobacco products, including cigarettes, chewing tobacco, and electronic cigarettes.  If you have any questions.  Other tests that may be performed during your second trimester include:  Blood tests that check for: ? Low iron levels (anemia). ? High blood sugar that affects pregnant women (gestational diabetes) between 73 and 28 weeks. ? Rh antibodies. This is to check for a protein on red blood cells (Rh factor).  Urine tests to check for infections, diabetes, or protein in the urine.  An ultrasound to confirm the proper growth and development of the baby.  An amniocentesis to check for possible genetic problems.  Fetal screens  for spina bifida and Down syndrome.  HIV (human immunodeficiency virus) testing. Routine prenatal testing includes screening for HIV, unless you choose not to have this test.  Follow these instructions at home: Medicines  Follow your health care provider's instructions regarding medicine use. Specific medicines may be either safe or unsafe to take during pregnancy.  Take a prenatal vitamin that contains at least 600 micrograms (mcg) of folic acid.  If you develop constipation, try taking a stool softener if your health care provider approves. Eating and drinking  Eat a balanced diet that includes fresh fruits and vegetables, whole grains, good sources of protein such as meat, eggs, or tofu, and low-fat dairy. Your health care provider will help you determine the amount of weight gain that is right for you.  Avoid raw meat and uncooked cheese. These carry germs that can cause birth defects in the baby.  If you have low calcium intake from food, talk to your health care provider about whether you should take a daily calcium supplement.  Limit foods  that are high in fat and processed sugars, such as fried and sweet foods.  To prevent constipation: ? Drink enough fluid to keep your urine clear or pale yellow. ? Eat foods that are high in fiber, such as fresh fruits and vegetables, whole grains, and beans. Activity  Exercise only as directed by your health care provider. Most women can continue their usual exercise routine during pregnancy. Try to exercise for 30 minutes at least 5 days a week. Stop exercising if you experience uterine contractions.  Avoid heavy lifting, wear low heel shoes, and practice good posture.  A sexual relationship may be continued unless your health care provider directs you otherwise. Relieving pain and discomfort  Wear a good support bra to prevent discomfort from breast tenderness.  Take warm sitz baths to soothe any pain or discomfort caused by hemorrhoids. Use hemorrhoid cream if your health care provider approves.  Rest with your legs elevated if you have leg cramps or low back pain.  If you develop varicose veins, wear support hose. Elevate your feet for 15 minutes, 3-4 times a day. Limit salt in your diet. Prenatal Care  Write down your questions. Take them to your prenatal visits.  Keep all your prenatal visits as told by your health care provider. This is important. Safety  Wear your seat belt at all times when driving.  Make a list of emergency phone numbers, including numbers for family, friends, the hospital, and police and fire departments. General instructions  Ask your health care provider for a referral to a local prenatal education class. Begin classes no later than the beginning of month 6 of your pregnancy.  Ask for help if you have counseling or nutritional needs during pregnancy. Your health care provider can offer advice or refer you to specialists for help with various needs.  Do not use hot tubs, steam rooms, or saunas.  Do not douche or use tampons or scented sanitary  pads.  Do not cross your legs for long periods of time.  Avoid cat litter boxes and soil used by cats. These carry germs that can cause birth defects in the baby and possibly loss of the fetus by miscarriage or stillbirth.  Avoid all smoking, herbs, alcohol, and unprescribed drugs. Chemicals in these products can affect the formation and growth of the baby.  Do not use any products that contain nicotine or tobacco, such as cigarettes and e-cigarettes. If you need help quitting,  ask your health care provider.  Visit your dentist if you have not gone yet during your pregnancy. Use a soft toothbrush to brush your teeth and be gentle when you floss. Contact a health care provider if:  You have dizziness.  You have mild pelvic cramps, pelvic pressure, or nagging pain in the abdominal area.  You have persistent nausea, vomiting, or diarrhea.  You have a bad smelling vaginal discharge.  You have pain when you urinate. Get help right away if:  You have a fever.  You are leaking fluid from your vagina.  You have spotting or bleeding from your vagina.  You have severe abdominal cramping or pain.  You have rapid weight gain or weight loss.  You have shortness of breath with chest pain.  You notice sudden or extreme swelling of your face, hands, ankles, feet, or legs.  You have not felt your baby move in over an hour.  You have severe headaches that do not go away when you take medicine.  You have vision changes. Summary  The second trimester is from week 14 through week 27 (months 4 through 6). It is also a time when the fetus is growing rapidly.  Your body goes through many changes during pregnancy. The changes vary from woman to woman.  Avoid all smoking, herbs, alcohol, and unprescribed drugs. These chemicals affect the formation and growth your baby.  Do not use any tobacco products, such as cigarettes, chewing tobacco, and e-cigarettes. If you need help quitting, ask your  health care provider.  Contact your health care provider if you have any questions. Keep all prenatal visits as told by your health care provider. This is important. This information is not intended to replace advice given to you by your health care provider. Make sure you discuss any questions you have with your health care provider. Document Released: 06/12/2001 Document Revised: 11/24/2015 Document Reviewed: 08/19/2012 Elsevier Interactive Patient Education  2017 [employer].

## 2020-03-09 NOTE — Telephone Encounter (Signed)
I called pt and relayed that per NP that she felt that since she has seen AL/NP and due to circumstances ([redacted]wks pregnant) that she would prefer her to see AL/NP.  I told her that I would cancel the mychart VV for tomorrow. Would place on cancellation list to see AL/NP.  Saw her obgyn today, fiorcet was taken when she got home and headache is gone.  Taking nortriptyline 25mg  po qhs  (she asked about botox, increasing nortriptyline).  botox not while pregnant and ? Increasing nortriptyline would touch base with AL/NP and let her know.

## 2020-03-09 NOTE — Telephone Encounter (Signed)
She was previously seen by [name] who has availability tomorrow afternoon therefore per request follow-up with her who is familiar with her care

## 2020-03-09 NOTE — Telephone Encounter (Signed)
Patient states she is having a headache, numbness and tingling in her hands.  Is planning to come to her visit this afternoon but wanted to talk with [name] first.  She is scheduled to see her neurologist tomorrow.  Advised to keep appointment as scheduled today.  Verbalized understanding and is planning on coming.

## 2020-03-09 NOTE — Progress Notes (Signed)
   LOW-RISK PREGNANCY VISIT Patient name: Meagan [name] MRN [id number]  Date of birth: 08-Oct-1990 Chief Complaint:   Routine Prenatal Visit  History of Present Illness:   Meagan [name] is a 29 y.o. [id number] female at [redacted]w[redacted]d with an Estimated Date of Delivery: 08/29/20 being seen today for ongoing management of a low-risk pregnancy.  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  . Vag. Bleeding: None.   . denies leaking of fluid. Review of Systems:   Pertinent items are noted in HPI Denies abnormal vaginal discharge w/ itching/odor/irritation, headaches, visual changes, shortness of breath, chest pain, abdominal pain, severe nausea/vomiting, or problems with urination or bowel movements unless otherwise stated above. Pertinent History Reviewed:  Reviewed past medical,surgical, social, obstetrical and family history.  Reviewed problem list, medications and allergies. Physical Assessment:   Vitals:   03/09/20 1339  BP: 110/65  Pulse: 89  Weight: 168 lb (76.2 kg)  Body mass index is 29.76 kg/m.        Physical Examination:   General appearance: Well appearing, and in no distress  Mental status: Alert, oriented to person, place, and time  Skin: Warm & dry  Cardiovascular: Normal heart rate noted  Respiratory: Normal respiratory effort, no distress  Abdomen: Soft, gravid, nontender  Pelvic: Cervical exam deferred; thin prep collected w/ HPV        Extremities: Edema: None  Fetal Status: Fetal Heart Rate (bpm): 150        Results for orders placed or performed in visit on 03/09/20 (from the past 24 hour(s))  POC Urinalysis Dipstick OB   Collection Time: 03/09/20  1:55 PM  Result Value Ref Range   Color, UA     Clarity, UA     Glucose, UA Negative Negative   Bilirubin, UA     Ketones, UA neg    Spec  Grav, UA     Blood, UA neg    pH, UA     POC,PROTEIN,UA Negative Negative, Trace, Small (1+), Moderate (2+), Large (3+), 4+   Urobilinogen, UA     Nitrite, UA neg    Leukocytes, UA Negative Negative   Appearance     Odor      Assessment & Plan:  1) Low-risk pregnancy [id number] at [redacted]w[redacted]d with an Estimated Date of Delivery: 08/29/20   2) 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  Labs/procedures today: 2nd IT & Pap  Plan:  Continue routine obstetrical care with anatomy scan at next visit  Reviewed: Preterm labor symptoms and general obstetric precautions including but not limited to vaginal bleeding, contractions, leaking of fluid and fetal movement were reviewed in detail with the patient.  All questions were answered. Has home bp cuff. Check bp weekly, let us know if >140/90.   Follow-up: Return in about 3 weeks (around 03/30/2020) for LROB, [name]: Anatomy, in person.  Orders Placed This Encounter  Procedures  . US OB Comp + 14 Wk  . INTEGRATED 2  . POC Urinalysis Dipstick OB   [name] [facility] 03/09/2020 2:07 PM

## 2020-03-09 NOTE — Telephone Encounter (Signed)
I called pt.  She is having sx of hemiplegic migraine.  She has had some vision issues which are getting better now, but has numbness/tingling L can radiate up to tongue then will get headache.  She has fiorcet at home and will take, is [redacted]wks pregnant now and has appt with OBgyn today at 1330.  Has 5 other children.  On nortriptyline 25mg  at bedtime for about 4 yrs and that has worked previously but when gets pregnant notes more migraine frequency. Takes xanax for panic attacks.  I made appt ([facility]) as she is not able to come tomorrow as has 5 kids with her.  I told her if any change in appt status will let her know.  She is to call 911 if things do not improve and worsen.

## 2020-03-10 ENCOUNTER — Telehealth: Payer: Self-pay | Admitting: Adult Health

## 2020-03-10 NOTE — Telephone Encounter (Signed)
I called and LMVM for pt that per AL/NP to keep things as she is doing at this time, no changes as she is pregnant. She can call back and make appt to have on schedule.

## 2020-03-14 LAB — INTEGRATED 2
AFP MoM: 1.07
Alpha-Fetoprotein: 29.8 ng/mL
Crown Rump Length: 50.1 mm
DIA MoM: 1.81
DIA Value: 269.3 pg/mL
Estriol, Unconjugated: 0.76 ng/mL
Gest. Age on Collection Date: 11.6 weeks
Gestational Age: 15.6 weeks
Maternal Age at EDD: 27.3 yr
Nuchal Translucency (NT): 0.9 mm
Nuchal Translucency MoM: 0.8
Number of Fetuses: 1
PAPP-A MoM: 0.41
PAPP-A Value: 243.4 ng/mL
Test Results:: NEGATIVE
Weight: 172 [lb_av]
Weight: 172 [lb_av]
hCG MoM: 0.78
hCG Value: 28.7 IU/mL
uE3 MoM: 0.93

## 2020-03-14 LAB — CYTOLOGY - PAP
Chlamydia: NEGATIVE
Comment: NEGATIVE
Comment: NEGATIVE
Comment: NORMAL
Diagnosis: NEGATIVE
High risk HPV: NEGATIVE
Neisseria Gonorrhea: NEGATIVE

## 2020-03-21 ENCOUNTER — Telehealth: Payer: Self-pay | Admitting: Women's Health

## 2020-03-21 ENCOUNTER — Telehealth: Payer: Self-pay | Admitting: *Deleted

## 2020-03-21 MED ORDER — NORTRIPTYLINE HCL 25 MG PO CAPS: 25.0000 mg | ORAL_CAPSULE | Freq: Every day | ORAL | 0 refills | Status: DC

## 2020-03-21 NOTE — Telephone Encounter (Signed)
Pt wants to pick up work note stating that she can return to work on 03/22/20

## 2020-03-21 NOTE — Addendum Note (Signed)
Addended by: Guy Begin on: 03/21/2020 02:06 PM   Modules accepted: Orders

## 2020-03-21 NOTE — Telephone Encounter (Signed)
Patient states she is needing a note to return to work as she has not been back since she had her last baby.  Will come by and pick up later today.

## 2020-03-21 NOTE — Telephone Encounter (Signed)
Pt has scheduled an annual f/u with [name],NP and she is on wait list, pt is asking if she can now get a refill on her nortriptyline (PAMELOR) 25 MG capsule to [facility]

## 2020-03-21 NOTE — Addendum Note (Signed)
Addended by: Shawnie Dapper L on: 03/21/2020 02:42 PM   Modules accepted: Orders

## 2020-04-05 ENCOUNTER — Other Ambulatory Visit: Payer: Self-pay | Admitting: Advanced Practice Midwife

## 2020-04-05 ENCOUNTER — Encounter: Payer: Self-pay | Admitting: *Deleted

## 2020-04-05 DIAGNOSIS — Z3480 Encounter for supervision of other normal pregnancy, unspecified trimester: Secondary | ICD-10-CM

## 2020-04-05 DIAGNOSIS — Z363 Encounter for antenatal screening for malformations: Secondary | ICD-10-CM

## 2020-04-06 ENCOUNTER — Other Ambulatory Visit: Payer: Self-pay

## 2020-04-06 ENCOUNTER — Ambulatory Visit (INDEPENDENT_AMBULATORY_CARE_PROVIDER_SITE_OTHER): Payer: Medicaid Other | Admitting: Advanced Practice Midwife

## 2020-04-06 ENCOUNTER — Ambulatory Visit (INDEPENDENT_AMBULATORY_CARE_PROVIDER_SITE_OTHER): Payer: Medicaid Other

## 2020-04-06 ENCOUNTER — Encounter: Payer: Self-pay | Admitting: Advanced Practice Midwife

## 2020-04-06 VITALS — BP 124/68 | HR 92 | Wt 174.6 lb

## 2020-04-06 DIAGNOSIS — Z1389 Encounter for screening for other disorder: Secondary | ICD-10-CM

## 2020-04-06 DIAGNOSIS — Z3480 Encounter for supervision of other normal pregnancy, unspecified trimester: Secondary | ICD-10-CM | POA: Diagnosis not present

## 2020-04-06 DIAGNOSIS — Z3A19 19 weeks gestation of pregnancy: Secondary | ICD-10-CM

## 2020-04-06 DIAGNOSIS — Z363 Encounter for antenatal screening for malformations: Secondary | ICD-10-CM | POA: Diagnosis not present

## 2020-04-06 DIAGNOSIS — Z331 Pregnant state, incidental: Secondary | ICD-10-CM

## 2020-04-06 LAB — POCT URINALYSIS DIPSTICK OB
Glucose, UA: NEGATIVE
Ketones, UA: NEGATIVE
Nitrite, UA: NEGATIVE

## 2020-04-06 NOTE — Patient Instructions (Signed)
Meagan [name], I greatly value your feedback.  If you receive a survey following your visit with [name] today, we appreciate you taking the time to fill it out.  Thanks, Philipp Deputy CNM  [facility] ([address] [place], [place] [address]) Entrance C, located off of [address] valet parking  Go to [email or web address] to register for FREE online childbirth classes  [place] Pediatricians/Family Doctors:  [facility] [phone]            [facility] [phone]                 [facility] [phone] (usually not accepting new patients unless you have family there already, you are always welcome to call and ask)       [facility] Department [phone]       [facility] Pediatricians/Family Doctors:   [name]: [phone]  [name]: [phone]  [name]: [phone]  [facility] Doctors:   [name]: [phone]   [facility]: [phone]  [facility] Doctors:  [facility]: [phone]    Home Blood Pressure Monitoring for Patients   Your provider has recommended that you check your blood pressure (BP) at least once a week at home. If you do not have a blood pressure cuff at home, one will be provided for you. Contact your provider if you have not received your monitor within 1 week.   Helpful Tips for Accurate Home Blood Pressure Checks  . Don't smoke, exercise, or drink caffeine 30 minutes before checking your BP . Use the restroom before checking your BP (a full bladder can raise your pressure) . Relax in a comfortable upright chair . Feet on the ground . Left arm resting comfortably on a flat surface at the level of your heart . Legs uncrossed . Back supported . Sit quietly and don't talk . Place the cuff on your bare arm . Adjust snuggly, so that  only two fingertips can fit between your skin and the top of the cuff . Check 2 readings separated by at least one minute . Keep a log of your BP readings . For a visual, please reference this diagram: http://ccnc.care/bpdiagram  Provider Name: [name]     Phone: [phone]  Zone 1: ALL CLEAR  Continue to monitor your symptoms:  . BP reading is less than 140 (top number) or less than 90 (bottom number)  . No right upper stomach pain . No headaches or seeing spots . No feeling nauseated or throwing up . No swelling in face and hands  Zone 2: CAUTION Call your doctor's office for any of the following:  . BP reading is greater than 140 (top number) or greater than 90 (bottom number)  . Stomach pain under your ribs in the middle or right side . Headaches or seeing spots . Feeling nauseated or throwing up . Swelling in face and hands  Zone 3: EMERGENCY  Seek immediate medical care if you have any of the following:  . BP reading is greater than160 (top number) or greater than 110 (bottom number) . Severe headaches not improving with Tylenol . Serious difficulty catching your breath . Any worsening symptoms from Zone 2     Second Trimester of Pregnancy The second trimester is from week 14 through week 27 (months 4 through 6). The second trimester is often a time when you feel your best.  Your body has adjusted to being pregnant, and you begin to feel better physically. Usually, morning sickness has lessened or quit completely, you may have more energy, and you may have an increase in appetite. The second trimester is also a time when the fetus is growing rapidly. At the end of the sixth month, the fetus is about 9 inches long and weighs about 1 pounds. You will likely begin to feel the baby move (quickening) between 16 and 20 weeks of pregnancy. Body changes during your second trimester Your body continues to go through many changes during your second trimester. The changes  vary from woman to woman.  Your weight will continue to increase. You will notice your lower abdomen bulging out.  You may begin to get stretch marks on your hips, abdomen, and breasts.  You may develop headaches that can be relieved by medicines. The medicines should be approved by your health care provider.  You may urinate more often because the fetus is pressing on your bladder.  You may develop or continue to have heartburn as a result of your pregnancy.  You may develop constipation because certain hormones are causing the muscles that push waste through your intestines to slow down.  You may develop hemorrhoids or swollen, bulging veins (varicose veins).  You may have back pain. This is caused by: ? Weight gain. ? Pregnancy hormones that are relaxing the joints in your pelvis. ? A shift in weight and the muscles that support your balance.  Your breasts will continue to grow and they will continue to become tender.  Your gums may bleed and may be sensitive to brushing and flossing.  Dark spots or blotches (chloasma, mask of pregnancy) may develop on your face. This will likely fade after the baby is born.  A dark line from your belly button to the pubic area (linea nigra) may appear. This will likely fade after the baby is born.  You may have changes in your hair. These can include thickening of your hair, rapid growth, and changes in texture. Some women also have hair loss during or after pregnancy, or hair that feels dry or thin. Your hair will most likely return to normal after your baby is born.  What to expect at prenatal visits During a routine prenatal visit:  You will be weighed to make sure you and the fetus are growing normally.  Your blood pressure will be taken.  Your abdomen will be measured to track your baby's growth.  The fetal heartbeat will be listened to.  Any test results from the previous visit will be discussed.  Your health care provider may  ask you:  How you are feeling.  If you are feeling the baby move.  If you have had any abnormal symptoms, such as leaking fluid, bleeding, severe headaches, or abdominal cramping.  If you are using any tobacco products, including cigarettes, chewing tobacco, and electronic cigarettes.  If you have any questions.  Other tests that may be performed during your second trimester include:  Blood tests that check for: ? Low iron levels (anemia). ? High blood sugar that affects pregnant women (gestational diabetes) between 73 and 28 weeks. ? Rh antibodies. This is to check for a protein on red blood cells (Rh factor).  Urine tests to check for infections, diabetes, or protein in the urine.  An ultrasound to confirm the proper growth and development of the baby.  An amniocentesis to check for possible genetic problems.  Fetal screens  for spina bifida and Down syndrome.  HIV (human immunodeficiency virus) testing. Routine prenatal testing includes screening for HIV, unless you choose not to have this test.  Follow these instructions at home: Medicines  Follow your health care provider's instructions regarding medicine use. Specific medicines may be either safe or unsafe to take during pregnancy.  Take a prenatal vitamin that contains at least 600 micrograms (mcg) of folic acid.  If you develop constipation, try taking a stool softener if your health care provider approves. Eating and drinking  Eat a balanced diet that includes fresh fruits and vegetables, whole grains, good sources of protein such as meat, eggs, or tofu, and low-fat dairy. Your health care provider will help you determine the amount of weight gain that is right for you.  Avoid raw meat and uncooked cheese. These carry germs that can cause birth defects in the baby.  If you have low calcium intake from food, talk to your health care provider about whether you should take a daily calcium supplement.  Limit foods  that are high in fat and processed sugars, such as fried and sweet foods.  To prevent constipation: ? Drink enough fluid to keep your urine clear or pale yellow. ? Eat foods that are high in fiber, such as fresh fruits and vegetables, whole grains, and beans. Activity  Exercise only as directed by your health care provider. Most women can continue their usual exercise routine during pregnancy. Try to exercise for 30 minutes at least 5 days a week. Stop exercising if you experience uterine contractions.  Avoid heavy lifting, wear low heel shoes, and practice good posture.  A sexual relationship may be continued unless your health care provider directs you otherwise. Relieving pain and discomfort  Wear a good support bra to prevent discomfort from breast tenderness.  Take warm sitz baths to soothe any pain or discomfort caused by hemorrhoids. Use hemorrhoid cream if your health care provider approves.  Rest with your legs elevated if you have leg cramps or low back pain.  If you develop varicose veins, wear support hose. Elevate your feet for 15 minutes, 3-4 times a day. Limit salt in your diet. Prenatal Care  Write down your questions. Take them to your prenatal visits.  Keep all your prenatal visits as told by your health care provider. This is important. Safety  Wear your seat belt at all times when driving.  Make a list of emergency phone numbers, including numbers for family, friends, the hospital, and police and fire departments. General instructions  Ask your health care provider for a referral to a local prenatal education class. Begin classes no later than the beginning of month 6 of your pregnancy.  Ask for help if you have counseling or nutritional needs during pregnancy. Your health care provider can offer advice or refer you to specialists for help with various needs.  Do not use hot tubs, steam rooms, or saunas.  Do not douche or use tampons or scented sanitary  pads.  Do not cross your legs for long periods of time.  Avoid cat litter boxes and soil used by cats. These carry germs that can cause birth defects in the baby and possibly loss of the fetus by miscarriage or stillbirth.  Avoid all smoking, herbs, alcohol, and unprescribed drugs. Chemicals in these products can affect the formation and growth of the baby.  Do not use any products that contain nicotine or tobacco, such as cigarettes and e-cigarettes. If you need help quitting,  ask your health care provider.  Visit your dentist if you have not gone yet during your pregnancy. Use a soft toothbrush to brush your teeth and be gentle when you floss. Contact a health care provider if:  You have dizziness.  You have mild pelvic cramps, pelvic pressure, or nagging pain in the abdominal area.  You have persistent nausea, vomiting, or diarrhea.  You have a bad smelling vaginal discharge.  You have pain when you urinate. Get help right away if:  You have a fever.  You are leaking fluid from your vagina.  You have spotting or bleeding from your vagina.  You have severe abdominal cramping or pain.  You have rapid weight gain or weight loss.  You have shortness of breath with chest pain.  You notice sudden or extreme swelling of your face, hands, ankles, feet, or legs.  You have not felt your baby move in over an hour.  You have severe headaches that do not go away when you take medicine.  You have vision changes. Summary  The second trimester is from week 14 through week 27 (months 4 through 6). It is also a time when the fetus is growing rapidly.  Your body goes through many changes during pregnancy. The changes vary from woman to woman.  Avoid all smoking, herbs, alcohol, and unprescribed drugs. These chemicals affect the formation and growth your baby.  Do not use any tobacco products, such as cigarettes, chewing tobacco, and e-cigarettes. If you need help quitting, ask your  health care provider.  Contact your health care provider if you have any questions. Keep all prenatal visits as told by your health care provider. This is important. This information is not intended to replace advice given to you by your health care provider. Make sure you discuss any questions you have with your health care provider. Document Released: 06/12/2001 Document Revised: 11/24/2015 Document Reviewed: 08/19/2012 Elsevier Interactive Patient Education  2017 [employer].

## 2020-04-06 NOTE — Progress Notes (Signed)
LOW-RISK PREGNANCY VISIT Patient name: Meagan [name] MRN [id number]  Date of birth: [birth year] Chief Complaint:   Routine Prenatal Visit ([name] today)  History of Present Illness:   Meagan [name] is a 29 y.o. [phone] female at [redacted]w[redacted]d with an Estimated Date of Delivery: 08/29/20 being seen today for ongoing management of a low-risk pregnancy.  Today she reports doing well; hasn't had a hemiplegic migrane since her last visit; has a neuro MD appt in Jan, but has access to a neuro NP if she needs help before then. Contractions: Not present. Vag. Bleeding: None.  Movement: Present. denies leaking of fluid. Review of Systems:   Pertinent items are noted in HPI Denies abnormal vaginal discharge w/ itching/odor/irritation, headaches, visual changes, shortness of breath, chest pain, abdominal pain, severe nausea/vomiting, or problems with urination or bowel movements unless otherwise stated above. Pertinent History Reviewed:  Reviewed past medical,surgical, social, obstetrical and family history.  Reviewed problem list, medications and allergies. Physical Assessment:   Vitals:   04/06/20 1144  BP: 124/68  Pulse: 92  Weight: 79.2 kg  Body mass index is 30.93 kg/m.        Physical Examination:   General appearance: Well appearing, and in no distress  Mental status: Alert, oriented to person, place, and time  Skin: Warm & dry  Cardiovascular: Normal heart rate noted  Respiratory: Normal respiratory effort, no distress  Abdomen: Soft, gravid, nontender  Pelvic: Cervical exam deferred         Extremities: Edema: None  Fetal Status: Fetal Heart Rate (bpm): 140 u/s   Movement: Present     Anatomy u/s: [name] 19+2 wks,cephalic,anterior left lateral placenta gr 0,cx 4.4 cm,svp of fluid 5.9 cm,normal ovaries,fhr 140 bpm,EFW 281 g 41%,anatomy complete,no obvious abnormalities   Results for orders placed or performed in visit on 04/06/20 (from the past 24 hour(s))  POC Urinalysis Dipstick  OB   Collection Time: 04/06/20 11:41 AM  Result Value Ref Range   Color, UA     Clarity, UA     Glucose, UA Negative Negative   Bilirubin, UA     Ketones, UA neg    Spec Grav, UA     Blood, UA trace    pH, UA     POC,PROTEIN,UA Trace Negative, Trace, Small (1+), Moderate (2+), Large (3+), 4+   Urobilinogen, UA     Nitrite, UA neg    Leukocytes, UA Trace (A) Negative   Appearance     Odor      Assessment & Plan:  1) Low-risk pregnancy [id number] at [redacted]w[redacted]d with an Estimated Date of Delivery: 08/29/20   2) Hx pre-e, hasn't started bASA yet- rec to do so soon  3) Hemiplegic migraines, 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  Labs/procedures today: anatomy US  Plan:  Continue routine obstetrical care   Reviewed: Preterm labor symptoms and general obstetric precautions including but not limited to vaginal bleeding, contractions, leaking of fluid and fetal movement were reviewed in detail with the patient.  All questions were answered. Has home bp cuff. Check bp weekly, let us know if >140/90.   Follow-up: Return in about 4 weeks (around 05/04/2020) for LROB, in person.  Orders Placed This Encounter  Procedures  . POC Urinalysis Dipstick  OB   [name] CNM 04/06/2020 12:08 PM

## 2020-04-06 NOTE — Progress Notes (Signed)
[name] 19+2 wks,cephalic,anterior left lateral placenta gr 0,cx 4.4 cm,svp of fluid 5.9 cm,normal ovaries,fhr 140 bpm,EFW 281 g 41%,anatomy complete,no obvious abnormalities

## 2020-04-28 ENCOUNTER — Other Ambulatory Visit: Payer: Self-pay | Admitting: Advanced Practice Midwife

## 2020-04-28 ENCOUNTER — Telehealth: Payer: Self-pay | Admitting: Women's Health

## 2020-04-28 MED ORDER — BUTALBITAL-APAP-CAFFEINE 50-325-40 MG PO TABS: 1.0000 | ORAL_TABLET | Freq: Four times a day (QID) | ORAL | 0 refills | Status: DC | PRN

## 2020-04-28 NOTE — Telephone Encounter (Signed)
Pt calling to request a refill of FIORICET  [facility]  Please advise & call pt

## 2020-05-02 ENCOUNTER — Telehealth: Payer: Self-pay | Admitting: Family Medicine

## 2020-05-02 MED ORDER — NORTRIPTYLINE HCL 25 MG PO CAPS: 25.0000 mg | ORAL_CAPSULE | Freq: Every day | ORAL | 0 refills | Status: DC

## 2020-05-02 NOTE — Telephone Encounter (Signed)
Pt request refill nortriptyline (PAMELOR) 25 MG capsule at [facility]

## 2020-05-02 NOTE — Telephone Encounter (Signed)
Done

## 2020-05-04 ENCOUNTER — Encounter: Payer: Medicaid Other | Admitting: Advanced Practice Midwife

## 2020-05-09 ENCOUNTER — Encounter: Payer: Medicaid Other | Admitting: Advanced Practice Midwife

## 2020-05-19 ENCOUNTER — Encounter: Payer: Medicaid Other | Admitting: Obstetrics & Gynecology

## 2020-06-01 ENCOUNTER — Encounter: Payer: Self-pay | Admitting: *Deleted

## 2020-06-06 ENCOUNTER — Encounter: Payer: Self-pay | Admitting: Women's Health

## 2020-06-06 ENCOUNTER — Ambulatory Visit (INDEPENDENT_AMBULATORY_CARE_PROVIDER_SITE_OTHER): Payer: Medicaid Other | Admitting: Women's Health

## 2020-06-06 ENCOUNTER — Other Ambulatory Visit: Payer: Self-pay

## 2020-06-06 ENCOUNTER — Other Ambulatory Visit: Payer: Medicaid Other

## 2020-06-06 VITALS — BP 122/65 | HR 105 | Wt 179.8 lb

## 2020-06-06 DIAGNOSIS — Z1389 Encounter for screening for other disorder: Secondary | ICD-10-CM

## 2020-06-06 DIAGNOSIS — Z3A28 28 weeks gestation of pregnancy: Secondary | ICD-10-CM

## 2020-06-06 DIAGNOSIS — Z3483 Encounter for supervision of other normal pregnancy, third trimester: Secondary | ICD-10-CM

## 2020-06-06 DIAGNOSIS — Z3480 Encounter for supervision of other normal pregnancy, unspecified trimester: Secondary | ICD-10-CM

## 2020-06-06 DIAGNOSIS — Z331 Pregnant state, incidental: Secondary | ICD-10-CM

## 2020-06-06 LAB — POCT URINALYSIS DIPSTICK OB
Blood, UA: NEGATIVE
Glucose, UA: NEGATIVE
Ketones, UA: NEGATIVE
Leukocytes, UA: NEGATIVE
Nitrite, UA: NEGATIVE
POC,PROTEIN,UA: NEGATIVE

## 2020-06-06 NOTE — Progress Notes (Signed)
LOW-RISK PREGNANCY VISIT Patient name: Meagan [name] MRN [id number]  Date of birth: September 19, 1990 Chief Complaint:   Routine Prenatal Visit  History of Present Illness:   Meagan [name] is a 29 y.o. [id number] female at [redacted]w[redacted]d with an Estimated Date of Delivery: 08/29/20 being seen today for ongoing management of a low-risk pregnancy.  Depression screen [name] 2/9 02/10/2020 01/06/2019 11/13/2018 06/26/2017  Decreased Interest 1 0 0 2  Down, Depressed, Hopeless 2 0 0 2  PHQ - 2 Score 3 0 0 4  Altered sleeping 2 0 - 1  Tired, decreased energy 2 0 - 3  Change in appetite 2 0 - 0  Feeling bad or failure about yourself  0 0 - 1  Trouble concentrating 1 0 - 3  Moving slowly or fidgety/restless 0 0 - 3  Suicidal thoughts 0 0 - 1  PHQ-9 Score 10 0 - 16  Difficult doing work/chores - - - Somewhat difficult  Some recent data might be hidden    Today she reports woke up this am shaky, didn't feel well, drank some gatorade and vomited, so unable to do PN2. Wants BTL. Contractions: Irregular. Vag. Bleeding: None.  Movement: Present. denies leaking of fluid. Review of Systems:   Pertinent items are noted in HPI Denies abnormal vaginal discharge w/ itching/odor/irritation, headaches, visual changes, shortness of breath, chest pain, abdominal pain, severe nausea/vomiting, or problems with urination or bowel movements unless otherwise stated above. Pertinent History Reviewed:  Reviewed past medical,surgical, social, obstetrical and family history.  Reviewed problem list, medications and allergies. Physical Assessment:   Vitals:   06/06/20 0945  BP: 122/65  Pulse: (!) 105  Weight: 179 lb 12.8 oz (81.6 kg)  Body mass index is 31.85 kg/m.        Physical Examination:   General appearance: Well appearing, and in no distress  Mental status: Alert, oriented to person, place, and time  Skin: Warm & dry  Cardiovascular: Normal heart rate noted  Respiratory: Normal respiratory effort, no  distress  Abdomen: Soft, gravid, nontender  Pelvic: Cervical exam deferred         Extremities: Edema: None  Fetal Status: Fetal Heart Rate (bpm): 158 Fundal Height: 28 cm Movement: Present    Chaperone: N/A   Results for orders placed or performed in visit on 06/06/20 (from the past 24 hour(s))  POC Urinalysis Dipstick OB   Collection Time: 06/06/20  9:43 AM  Result Value Ref Range   Color, UA     Clarity, UA     Glucose, UA Negative Negative   Bilirubin, UA     Ketones, UA neg    Spec Grav, UA     Blood, UA neg    pH, UA     POC,PROTEIN,UA Negative Negative, Trace, Small (1+), Moderate (2+), Large (3+), 4+   Urobilinogen, UA     Nitrite, UA neg    Leukocytes, UA Negative Negative   Appearance     Odor      Assessment & Plan:  1) Low-risk pregnancy [id number] at [redacted]w[redacted]d with an Estimated Date of Delivery: 08/29/20   2) Wants BTL> reviewed risks/benefits, LARCs just as effective, consent signed today   3) Prev c/s> plans TOLAC  4) Anxiety> on pamelor and xanax 0.5mg  TID prn, tries to only take BID  5) H/O pre-e> on ASA   Meds: No orders of the defined types were placed in this encounter.  Labs/procedures today: declined flu and tdap today, wants tdap next  visit  Plan:  Continue routine obstetrical care  Next visit: prefers will be in person for pn2    Reviewed: Preterm labor symptoms and general obstetric precautions including but not limited to vaginal bleeding, contractions, leaking of fluid and fetal movement were reviewed in detail with the patient.  All questions were answered.   Follow-up: Return for ASAP, PN2 (no visit), then 4wks LROB w/ CNM .  Future Appointments  Date Time Provider Department Center  06/13/2020  8:50 AM [facility]  07/04/2020  9:00 AM Lomax, [name], NP [facility] None    Orders Placed This Encounter  Procedures  . POC Urinalysis Dipstick OB   [name] CNM, [facility] 06/06/2020 10:17 AM

## 2020-06-06 NOTE — Patient Instructions (Signed)
Meagan [name], I greatly value your feedback.  If you receive a survey following your visit with [name] today, we appreciate you taking the time to fill it out.  Thanks, [name], CNM, WHNP-BC   [facility] ([address] [place], [place] [address]) Entrance C, located off of [address] valet parking  Go to [email or web address] to register for FREE online childbirth classes   Call the office [phone]) or go to [facility] if:  You begin to have strong, frequent contractions  Your water breaks.  Sometimes it is a big gush of fluid, sometimes it is just a trickle that keeps getting your panties wet or running down your legs  You have vaginal bleeding.  It is normal to have a small amount of spotting if your cervix was checked.   You don't feel your baby moving like normal.  If you don't, get you something to eat and drink and lay down and focus on feeling your baby move.  You should feel at least 10 movements in 2 hours.  If you don't, you should call the office or go to [facility].    Tdap Vaccine  It is recommended that you get the Tdap vaccine during the third trimester of EACH pregnancy to help protect your baby from getting pertussis (whooping cough)  27-36 weeks is the BEST time to do this so that you can pass the protection on to your baby. During pregnancy is better than after pregnancy, but if you are unable to get it during pregnancy it will be offered at the hospital.   You can get this vaccine with [name], at the health department, your family doctor, or some local pharmacies  Everyone who will be around your baby should also be up-to-date on their vaccines before the baby comes. Adults (who are not pregnant) only need 1 dose of Tdap during adulthood.   [place] Pediatricians/Family Doctors:  [facility] [phone]            [facility] [phone]                 [facility] [phone] (usually not accepting new patients unless you have family there already, you are always welcome to call and ask)       [facility] Department [phone]       [facility] Pediatricians/Family Doctors:   [name]: [phone]  [name]: [phone]  [name]: [phone]  [facility] Doctors:   [name]: [phone]   [facility]: [phone]  [facility] Doctors:  [facility]: [phone]   Home Blood Pressure Monitoring for Patients   Your provider has recommended that you check your blood pressure (BP) at least once a week at home. If you do not have a blood pressure cuff at home, one will be provided for you. Contact your provider if you have not received your monitor within 1 week.   Helpful Tips for Accurate Home Blood Pressure Checks  . Don't smoke, exercise, or drink caffeine 30 minutes before checking your BP . Use the restroom before checking your BP (a full bladder can raise your pressure) . Relax in a comfortable upright chair . Feet on the ground . Left arm resting comfortably on a flat surface at the level of your heart . Legs uncrossed . Back supported . Sit quietly and don't talk . Place the cuff on your  bare arm . Adjust snuggly, so that only two fingertips can fit between your skin and the top of the cuff . Check 2 readings separated by at least one minute . Keep a log of your BP readings . For a visual, please reference this diagram: http://ccnc.care/bpdiagram  Provider Name: [name]     Phone: [phone]  Zone 1: ALL CLEAR  Continue to monitor your symptoms:  . BP reading is less than 140 (top number) or less than 90 (bottom number)  . No right upper stomach pain . No headaches or seeing spots . No feeling nauseated or throwing up . No swelling in face and hands  Zone 2: CAUTION Call  your doctor's office for any of the following:  . BP reading is greater than 140 (top number) or greater than 90 (bottom number)  . Stomach pain under your ribs in the middle or right side . Headaches or seeing spots . Feeling nauseated or throwing up . Swelling in face and hands  Zone 3: EMERGENCY  Seek immediate medical care if you have any of the following:  . BP reading is greater than160 (top number) or greater than 110 (bottom number) . Severe headaches not improving with Tylenol . Serious difficulty catching your breath . Any worsening symptoms from Zone 2   Third Trimester of Pregnancy The third trimester is from week 29 through week 42, months 7 through 9. The third trimester is a time when the fetus is growing rapidly. At the end of the ninth month, the fetus is about 20 inches in length and weighs 6-10 pounds.  BODY CHANGES Your body goes through many changes during pregnancy. The changes vary from woman to woman.   Your weight will continue to increase. You can expect to gain 25-35 pounds (11-16 kg) by the end of the pregnancy.  You may begin to get stretch marks on your hips, abdomen, and breasts.  You may urinate more often because the fetus is moving lower into your pelvis and pressing on your bladder.  You may develop or continue to have heartburn as a result of your pregnancy.  You may develop constipation because certain hormones are causing the muscles that push waste through your intestines to slow down.  You may develop hemorrhoids or swollen, bulging veins (varicose veins).  You may have pelvic pain because of the weight gain and pregnancy hormones relaxing your joints between the bones in your pelvis. Backaches may result from overexertion of the muscles supporting your posture.  You may have changes in your hair. These can include thickening of your hair, rapid growth, and changes in texture. Some women also have hair loss during or after pregnancy, or hair  that feels dry or thin. Your hair will most likely return to normal after your baby is born.  Your breasts will continue to grow and be tender. A yellow discharge may leak from your breasts called colostrum.  Your belly button may stick out.  You may feel short of breath because of your expanding uterus.  You may notice the fetus "dropping," or moving lower in your abdomen.  You may have a bloody mucus discharge. This usually occurs a few days to a week before labor begins.  Your cervix becomes thin and soft (effaced) near your due date. WHAT TO EXPECT AT YOUR PRENATAL EXAMS  You will have prenatal exams every 2 weeks until week 36. Then, you will have weekly prenatal exams. During a routine prenatal visit:  You will be weighed to make sure you and the fetus are growing normally.  Your blood pressure is taken.  Your abdomen will be measured to track your baby's growth.  The fetal heartbeat will be listened to.  Any test results from the previous visit will be discussed.  You may have a cervical check near your due date to see if you have effaced. At around 36 weeks, your caregiver will check your cervix. At the same time, your caregiver will also perform a test on the secretions of the vaginal tissue. This test is to determine if a type of bacteria, Group B streptococcus, is present. Your caregiver will explain this further. Your caregiver may ask you:  What your birth plan is.  How you are feeling.  If you are feeling the baby move.  If you have had any abnormal symptoms, such as leaking fluid, bleeding, severe headaches, or abdominal cramping.  If you have any questions. Other tests or screenings that may be performed during your third trimester include:  Blood tests that check for low iron levels (anemia).  Fetal testing to check the health, activity level, and growth of the fetus. Testing is done if you have certain medical conditions or if there are problems during the  pregnancy. FALSE LABOR You may feel small, irregular contractions that eventually go away. These are called Braxton Hicks contractions, or false labor. Contractions may last for hours, days, or even weeks before true labor sets in. If contractions come at regular intervals, intensify, or become painful, it is best to be seen by your caregiver.  SIGNS OF LABOR   Menstrual-like cramps.  Contractions that are 5 minutes apart or less.  Contractions that start on the top of the uterus and spread down to the lower abdomen and back.  A sense of increased pelvic pressure or back pain.  A watery or bloody mucus discharge that comes from the vagina. If you have any of these signs before the 37th week of pregnancy, call your caregiver right away. You need to go to the hospital to get checked immediately. HOME CARE INSTRUCTIONS   Avoid all smoking, herbs, alcohol, and unprescribed drugs. These chemicals affect the formation and growth of the baby.  Follow your caregiver's instructions regarding medicine use. There are medicines that are either safe or unsafe to take during pregnancy.  Exercise only as directed by your caregiver. Experiencing uterine cramps is a good sign to stop exercising.  Continue to eat regular, healthy meals.  Wear a good support bra for breast tenderness.  Do not use hot tubs, steam rooms, or saunas.  Wear your seat belt at all times when driving.  Avoid raw meat, uncooked cheese, cat litter boxes, and soil used by cats. These carry germs that can cause birth defects in the baby.  Take your prenatal vitamins.  Try taking a stool softener (if your caregiver approves) if you develop constipation. Eat more high-fiber foods, such as fresh vegetables or fruit and whole grains. Drink plenty of fluids to keep your urine clear or pale yellow.  Take warm sitz baths to soothe any pain or discomfort caused by hemorrhoids. Use hemorrhoid cream if your caregiver approves.  If you  develop varicose veins, wear support hose. Elevate your feet for 15 minutes, 3-4 times a day. Limit salt in your diet.  Avoid heavy lifting, wear low heal shoes, and practice good posture.  Rest a lot with your legs elevated if you have leg cramps or low  back pain.  Visit your dentist if you have not gone during your pregnancy. Use a soft toothbrush to brush your teeth and be gentle when you floss.  A sexual relationship may be continued unless your caregiver directs you otherwise.  Do not travel far distances unless it is absolutely necessary and only with the approval of your caregiver.  Take prenatal classes to understand, practice, and ask questions about the labor and delivery.  Make a trial run to the hospital.  Pack your hospital bag.  Prepare the baby's nursery.  Continue to go to all your prenatal visits as directed by your caregiver. SEEK MEDICAL CARE IF:  You are unsure if you are in labor or if your water has broken.  You have dizziness.  You have mild pelvic cramps, pelvic pressure, or nagging pain in your abdominal area.  You have persistent nausea, vomiting, or diarrhea.  You have a bad smelling vaginal discharge.  You have pain with urination. SEEK IMMEDIATE MEDICAL CARE IF:   You have a fever.  You are leaking fluid from your vagina.  You have spotting or bleeding from your vagina.  You have severe abdominal cramping or pain.  You have rapid weight loss or gain.  You have shortness of breath with chest pain.  You notice sudden or extreme swelling of your face, hands, ankles, feet, or legs.  You have not felt your baby move in over an hour.  You have severe headaches that do not go away with medicine.  You have vision changes. Document Released: 06/12/2001 Document Revised: 06/23/2013 Document Reviewed: 08/19/2012 [facility] Patient Information 2015 [place], [place]. This information is not intended to replace advice given to you by your health  care provider. Make sure you discuss any questions you have with your health care provider.

## 2020-06-07 ENCOUNTER — Telehealth: Payer: Self-pay | Admitting: Obstetrics & Gynecology

## 2020-06-07 MED ORDER — ALPRAZOLAM 0.5 MG PO TABS
ORAL_TABLET | ORAL | 3 refills | Status: DC
Start: 2020-06-07 — End: 2020-10-03

## 2020-06-07 NOTE — Telephone Encounter (Signed)
Pt calling to request a refill on her Xanax States the bottle she has does not have any refills left on it & that Dr. Despina Hidden refills this for her Refill is due 12/14 per pt but last time it was needed he was out of the office & pt states she ended up at the hospital  Please advise & notify pt    [facility]

## 2020-06-08 NOTE — Telephone Encounter (Signed)
Left message letting pt know med was sent to pharmacy. JSY 

## 2020-06-09 NOTE — Telephone Encounter (Signed)
Refill ordered by Dr. Despina Hidden 06/07/2020

## 2020-06-13 ENCOUNTER — Other Ambulatory Visit: Payer: Medicaid Other

## 2020-06-22 ENCOUNTER — Other Ambulatory Visit: Payer: Medicaid Other

## 2020-06-23 ENCOUNTER — Other Ambulatory Visit: Payer: Self-pay | Admitting: Advanced Practice Midwife

## 2020-06-28 ENCOUNTER — Other Ambulatory Visit: Payer: Medicaid Other

## 2020-06-28 ENCOUNTER — Ambulatory Visit: Payer: Medicaid Other

## 2020-07-02 NOTE — L&D Delivery Note (Signed)
[facility]N [facility] Delivery Note  Meagan [name] is a 30 y.o. [phone] s/p VBAC at [redacted]w[redacted]d. She was admitted for SOL/vag bleeding.   ROM: 0h 29m with clear/pink-tinged fluid GBS Status: neg Maximum Maternal Temperature: 97.8  Labor Progress: Marland Kitchen [name] was admitted in early active labor with mod bloody show, requesting a TOLAC. She progressed spont, and then was 9cm with BBOW and FHR variables. Membranes were ruptured during an exam, and then she pushed to successful VBAC with the next ctx.  Delivery Date/Time: August 10, 2020 at 2326 Delivery: Called to room and patient was 9cm with FHR variables and feeling pressure. She pushed with one ctx after AROM and the head delivered ROA. Nuchal cord present x 2- reduced. Shoulder and body delivered in usual fashion. Infant with spontaneous cry, placed on mother's abdomen, dried and stimulated. Cord clamped x 2 after 1-minute delay, and cut by FOB. Cord blood drawn. Placenta delivered spontaneously with gentle cord traction. Fundus firm with massage and Pitocin. Labia, perineum, vagina, and cervix inspected and found to be intact.   Placenta: approx 25% of adherent clot on maternal surface, suggestive of abruption Complications: mild placental abruption Lacerations: none EBL: 100cc Analgesia: epidural  Postpartum Planning [x]  message to sent to schedule follow-up>1-2wk preop appt for BTL and to sign 30d papers [ ]  Rhogam eval [ ]  SW consult for substance use   Infant: boy  APGARs 8/9  2880g (6lb 5.6oz)  , CNM  08/10/2020 11:49 PM

## 2020-07-04 ENCOUNTER — Encounter: Payer: Self-pay | Admitting: Family Medicine

## 2020-07-04 ENCOUNTER — Ambulatory Visit: Payer: Medicaid Other | Admitting: Family Medicine

## 2020-07-07 ENCOUNTER — Ambulatory Visit (INDEPENDENT_AMBULATORY_CARE_PROVIDER_SITE_OTHER): Payer: Medicaid Other | Admitting: Obstetrics and Gynecology

## 2020-07-07 ENCOUNTER — Encounter: Payer: Self-pay | Admitting: Obstetrics and Gynecology

## 2020-07-07 ENCOUNTER — Other Ambulatory Visit: Payer: Self-pay

## 2020-07-07 VITALS — BP 131/77 | HR 106 | Wt 187.2 lb

## 2020-07-07 DIAGNOSIS — Z3480 Encounter for supervision of other normal pregnancy, unspecified trimester: Secondary | ICD-10-CM

## 2020-07-07 DIAGNOSIS — Z98891 History of uterine scar from previous surgery: Secondary | ICD-10-CM

## 2020-07-07 DIAGNOSIS — Z131 Encounter for screening for diabetes mellitus: Secondary | ICD-10-CM

## 2020-07-07 DIAGNOSIS — Z3A32 32 weeks gestation of pregnancy: Secondary | ICD-10-CM

## 2020-07-07 DIAGNOSIS — Z1389 Encounter for screening for other disorder: Secondary | ICD-10-CM

## 2020-07-07 DIAGNOSIS — Z6791 Unspecified blood type, Rh negative: Secondary | ICD-10-CM

## 2020-07-07 DIAGNOSIS — O26899 Other specified pregnancy related conditions, unspecified trimester: Secondary | ICD-10-CM

## 2020-07-07 DIAGNOSIS — O09299 Supervision of pregnancy with other poor reproductive or obstetric history, unspecified trimester: Secondary | ICD-10-CM

## 2020-07-07 DIAGNOSIS — Z3009 Encounter for other general counseling and advice on contraception: Secondary | ICD-10-CM

## 2020-07-07 DIAGNOSIS — Z331 Pregnant state, incidental: Secondary | ICD-10-CM

## 2020-07-07 LAB — POCT URINALYSIS DIPSTICK OB
Blood, UA: NEGATIVE
Glucose, UA: NEGATIVE
Ketones, UA: NEGATIVE
Leukocytes, UA: NEGATIVE
Nitrite, UA: NEGATIVE
POC,PROTEIN,UA: NEGATIVE

## 2020-07-07 NOTE — Progress Notes (Signed)
Subjective:  Meagan [name] is a 30 y.o. [phone] at [redacted]w[redacted]d being seen today for ongoing prenatal care.  She is currently monitored for the following issues for this low-risk pregnancy and has Anxiety; Smoker; Hemiplegic migraine; Hx of preeclampsia; Chronic prescription benzodiazepine use; Previous cesarean section; Rh negative state in antepartum period; Short interval between pregnancies affecting pregnancy in first trimester, antepartum; Encounter for supervision of normal pregnancy, antepartum; History of vaginal delivery following previous cesarean delivery; Marijuana use; and Unwanted fertility on their problem list.  Patient reports no complaints.  Contractions: Irregular. Vag. Bleeding: None.  Movement: Present. Denies leaking of fluid.   The following portions of the patient's history were reviewed and updated as appropriate: allergies, current medications, past family history, past medical history, past social history, past surgical history and problem list. Problem list updated.  Objective:   Vitals:   07/07/20 1339  BP: 131/77  Pulse: (!) 106  Weight: 187 lb 3.2 oz (84.9 kg)    Fetal Status:     Movement: Present     General:  Alert, oriented and cooperative. Patient is in no acute distress.  Skin: Skin is warm and dry. No rash noted.   Cardiovascular: Normal heart rate noted  Respiratory: Normal respiratory effort, no problems with respiration noted  Abdomen: Soft, gravid, appropriate for gestational age. Pain/Pressure: Absent     Pelvic:  Cervical exam deferred        Extremities: Normal range of motion.  Edema: Trace  Mental Status: Normal mood and affect. Normal behavior. Normal judgment and thought content.   Urinalysis:      Assessment and Plan:  Pregnancy: [id number] at [redacted]w[redacted]d  1. [redacted] weeks gestation of pregnancy  - POC Urinalysis [facility] - Glucose tolerance, 1 hour  2. Screening for genitourinary condition  - POC Urinalysis [facility]  3. Pregnant  state, incidental  - POC Urinalysis [facility]  4. Supervision of other normal pregnancy, antepartum  - Glucose tolerance, 1 hour  5. History of vaginal delivery following previous cesarean delivery Desires TOLAC  6. Rh negative state in antepartum period 28 week labs today   7. Previous cesarean section See above  8. Hx of preeclampsia BP stable  9. Unwanted fertility BTL papers signed  10. Diabetes mellitus screening  - Glucose tolerance, 1 hour  Preterm labor symptoms and general obstetric precautions including but not limited to vaginal bleeding, contractions, leaking of fluid and fetal movement were reviewed in detail with the patient. Please refer to After Visit Summary for other counseling recommendations.  Return in about 2 weeks (around 07/21/2020) for OB visit, face to face, any provider.   Hermina Staggers, MD

## 2020-07-07 NOTE — Patient Instructions (Signed)
Third Trimester of Pregnancy The third trimester is from week 28 through week 40 (months 7 through 9). The third trimester is a time when the unborn baby (fetus) is growing rapidly. At the end of the ninth month, the fetus is about 20 inches in length and weighs 6-10 pounds. Body changes during your third trimester Your body will continue to go through many changes during pregnancy. The changes vary from woman to woman. During the third trimester:  Your weight will continue to increase. You can expect to gain 25-35 pounds (11-16 kg) by the end of the pregnancy.  You may begin to get stretch marks on your hips, abdomen, and breasts.  You may urinate more often because the fetus is moving lower into your pelvis and pressing on your bladder.  You may develop or continue to have heartburn. This is caused by increased hormones that slow down muscles in the digestive tract.  You may develop or continue to have constipation because increased hormones slow digestion and cause the muscles that push waste through your intestines to relax.  You may develop hemorrhoids. These are swollen veins (varicose veins) in the rectum that can itch or be painful.  You may develop swollen, bulging veins (varicose veins) in your legs.  You may have increased body aches in the pelvis, back, or thighs. This is due to weight gain and increased hormones that are relaxing your joints.  You may have changes in your hair. These can include thickening of your hair, rapid growth, and changes in texture. Some women also have hair loss during or after pregnancy, or hair that feels dry or thin. Your hair will most likely return to normal after your baby is born.  Your breasts will continue to grow and they will continue to become tender. A yellow fluid (colostrum) may leak from your breasts. This is the first milk you are producing for your baby.  Your belly button may stick out.  You may notice more swelling in your hands,  face, or ankles.  You may have increased tingling or numbness in your hands, arms, and legs. The skin on your belly may also feel numb.  You may feel short of breath because of your expanding uterus.  You may have more problems sleeping. This can be caused by the size of your belly, increased need to urinate, and an increase in your body's metabolism.  You may notice the fetus "dropping," or moving lower in your abdomen (lightening).  You may have increased vaginal discharge.  You may notice your joints feel loose and you may have pain around your pelvic bone. What to expect at prenatal visits You will have prenatal exams every 2 weeks until week 36. Then you will have weekly prenatal exams. During a routine prenatal visit:  You will be weighed to make sure you and the baby are growing normally.  Your blood pressure will be taken.  Your abdomen will be measured to track your baby's growth.  The fetal heartbeat will be listened to.  Any test results from the previous visit will be discussed.  You may have a cervical check near your due date to see if your cervix has softened or thinned (effaced).  You will be tested for Group B streptococcus. This happens between 35 and 37 weeks. Your health care provider may ask you:  What your birth plan is.  How you are feeling.  If you are feeling the baby move.  If you have had any abnormal   symptoms, such as leaking fluid, bleeding, severe headaches, or abdominal cramping.  If you are using any tobacco products, including cigarettes, chewing tobacco, and electronic cigarettes.  If you have any questions. Other tests or screenings that may be performed during your third trimester include:  Blood tests that check for low iron levels (anemia).  Fetal testing to check the health, activity level, and growth of the fetus. Testing is done if you have certain medical conditions or if there are problems during the pregnancy.  Nonstress test  (NST). This test checks the health of your baby to make sure there are no signs of problems, such as the baby not getting enough oxygen. During this test, a belt is placed around your belly. The baby is made to move, and its heart rate is monitored during movement. What is false labor? False labor is a condition in which you feel small, irregular tightenings of the muscles in the womb (contractions) that usually go away with rest, changing position, or drinking water. These are called Braxton Hicks contractions. Contractions may last for hours, days, or even weeks before true labor sets in. If contractions come at regular intervals, become more frequent, increase in intensity, or become painful, you should see your health care provider. What are the signs of labor?  Abdominal cramps.  Regular contractions that start at 10 minutes apart and become stronger and more frequent with time.  Contractions that start on the top of the uterus and spread down to the lower abdomen and back.  Increased pelvic pressure and dull back pain.  A watery or bloody mucus discharge that comes from the vagina.  Leaking of amniotic fluid. This is also known as your "water breaking." It could be a slow trickle or a gush. Let your health care provider know if it has a color or strange odor. If you have any of these signs, call your health care provider right away, even if it is before your due date. Follow these instructions at home: Medicines  Follow your health care provider's instructions regarding medicine use. Specific medicines may be either safe or unsafe to take during pregnancy.  Take a prenatal vitamin that contains at least 600 micrograms (mcg) of folic acid.  If you develop constipation, try taking a stool softener if your health care provider approves. Eating and drinking   Eat a balanced diet that includes fresh fruits and vegetables, whole grains, good sources of protein such as meat, eggs, or tofu,  and low-fat dairy. Your health care provider will help you determine the amount of weight gain that is right for you.  Avoid raw meat and uncooked cheese. These carry germs that can cause birth defects in the baby.  If you have low calcium intake from food, talk to your health care provider about whether you should take a daily calcium supplement.  Eat four or five small meals rather than three large meals a day.  Limit foods that are high in fat and processed sugars, such as fried and sweet foods.  To prevent constipation: ? Drink enough fluid to keep your urine clear or pale yellow. ? Eat foods that are high in fiber, such as fresh fruits and vegetables, whole grains, and beans. Activity  Exercise only as directed by your health care provider. Most women can continue their usual exercise routine during pregnancy. Try to exercise for 30 minutes at least 5 days a week. Stop exercising if you experience uterine contractions.  Avoid heavy lifting.  Do   not exercise in extreme heat or humidity, or at high altitudes.  Wear low-heel, comfortable shoes.  Practice good posture.  You may continue to have sex unless your health care provider tells you otherwise. Relieving pain and discomfort  Take frequent breaks and rest with your legs elevated if you have leg cramps or low back pain.  Take warm sitz baths to soothe any pain or discomfort caused by hemorrhoids. Use hemorrhoid cream if your health care provider approves.  Wear a good support bra to prevent discomfort from breast tenderness.  If you develop varicose veins: ? Wear support pantyhose or compression stockings as told by your healthcare provider. ? Elevate your feet for 15 minutes, 3-4 times a day. Prenatal care  Write down your questions. Take them to your prenatal visits.  Keep all your prenatal visits as told by your health care provider. This is important. Safety  Wear your seat belt at all times when driving.  Make  a list of emergency phone numbers, including numbers for family, friends, the hospital, and police and fire departments. General instructions  Avoid cat litter boxes and soil used by cats. These carry germs that can cause birth defects in the baby. If you have a cat, ask someone to clean the litter box for you.  Do not travel far distances unless it is absolutely necessary and only with the approval of your health care provider.  Do not use hot tubs, steam rooms, or saunas.  Do not drink alcohol.  Do not use any products that contain nicotine or tobacco, such as cigarettes and e-cigarettes. If you need help quitting, ask your health care provider.  Do not use any medicinal herbs or unprescribed drugs. These chemicals affect the formation and growth of the baby.  Do not douche or use tampons or scented sanitary pads.  Do not cross your legs for long periods of time.  To prepare for the arrival of your baby: ? Take prenatal classes to understand, practice, and ask questions about labor and delivery. ? Make a trial run to the hospital. ? Visit the hospital and tour the maternity area. ? Arrange for maternity or paternity leave through employers. ? Arrange for family and friends to take care of pets while you are in the hospital. ? Purchase a rear-facing car seat and make sure you know how to install it in your car. ? Pack your hospital bag. ? Prepare the baby's nursery. Make sure to remove all pillows and stuffed animals from the baby's crib to prevent suffocation.  Visit your dentist if you have not gone during your pregnancy. Use a soft toothbrush to brush your teeth and be gentle when you floss. Contact a health care provider if:  You are unsure if you are in labor or if your water has broken.  You become dizzy.  You have mild pelvic cramps, pelvic pressure, or nagging pain in your abdominal area.  You have lower back pain.  You have persistent nausea, vomiting, or  diarrhea.  You have an unusual or bad smelling vaginal discharge.  You have pain when you urinate. Get help right away if:  Your water breaks before 37 weeks.  You have regular contractions less than 5 minutes apart before 37 weeks.  You have a fever.  You are leaking fluid from your vagina.  You have spotting or bleeding from your vagina.  You have severe abdominal pain or cramping.  You have rapid weight loss or weight gain.  You have   shortness of breath with chest pain.  You notice sudden or extreme swelling of your face, hands, ankles, feet, or legs.  Your baby makes fewer than 10 movements in 2 hours.  You have severe headaches that do not go away when you take medicine.  You have vision changes. Summary  The third trimester is from week 28 through week 40, months 7 through 9. The third trimester is a time when the unborn baby (fetus) is growing rapidly.  During the third trimester, your discomfort may increase as you and your baby continue to gain weight. You may have abdominal, leg, and back pain, sleeping problems, and an increased need to urinate.  During the third trimester your breasts will keep growing and they will continue to become tender. A yellow fluid (colostrum) may leak from your breasts. This is the first milk you are producing for your baby.  False labor is a condition in which you feel small, irregular tightenings of the muscles in the womb (contractions) that eventually go away. These are called Braxton Hicks contractions. Contractions may last for hours, days, or even weeks before true labor sets in.  Signs of labor can include: abdominal cramps; regular contractions that start at 10 minutes apart and become stronger and more frequent with time; watery or bloody mucus discharge that comes from the vagina; increased pelvic pressure and dull back pain; and leaking of amniotic fluid. This information is not intended to replace advice given to you by your  health care provider. Make sure you discuss any questions you have with your health care provider. Document Revised: 10/09/2018 Document Reviewed: 07/24/2016 Elsevier Patient Education  2020 [employer]  

## 2020-07-08 LAB — CBC
Hematocrit: 31.4 % — ABNORMAL LOW (ref 34.0–46.6)
Hemoglobin: 10.9 g/dL — ABNORMAL LOW (ref 11.1–15.9)
MCH: 31.4 pg (ref 26.6–33.0)
MCHC: 34.7 g/dL (ref 31.5–35.7)
MCV: 91 fL (ref 79–97)
Platelets: 195 10*3/uL (ref 150–450)
RBC: 3.47 x10E6/uL — ABNORMAL LOW (ref 3.77–5.28)
RDW: 13 % (ref 11.7–15.4)
WBC: 7 10*3/uL (ref 3.4–10.8)

## 2020-07-08 LAB — ANTIBODY SCREEN: Antibody Screen: NEGATIVE

## 2020-07-08 LAB — GLUCOSE TOLERANCE, 1 HOUR: Glucose, 1Hr PP: 139 mg/dL (ref 65–199)

## 2020-07-08 LAB — RPR: RPR Ser Ql: NONREACTIVE

## 2020-07-08 LAB — HIV ANTIBODY (ROUTINE TESTING W REFLEX): HIV Screen 4th Generation wRfx: NONREACTIVE

## 2020-07-11 ENCOUNTER — Other Ambulatory Visit: Payer: Self-pay | Admitting: Women's Health

## 2020-07-11 MED ORDER — FERROUS SULFATE 325 (65 FE) MG PO TABS: 325.0000 mg | ORAL_TABLET | ORAL | 2 refills | Status: DC

## 2020-07-12 ENCOUNTER — Other Ambulatory Visit: Payer: Medicaid Other

## 2020-07-14 ENCOUNTER — Encounter: Payer: Self-pay | Admitting: Advanced Practice Midwife

## 2020-07-14 ENCOUNTER — Ambulatory Visit (INDEPENDENT_AMBULATORY_CARE_PROVIDER_SITE_OTHER): Payer: Medicaid Other | Admitting: Advanced Practice Midwife

## 2020-07-14 ENCOUNTER — Other Ambulatory Visit: Payer: Self-pay

## 2020-07-14 VITALS — BP 126/82 | HR 101 | Wt 189.0 lb

## 2020-07-14 DIAGNOSIS — Z3493 Encounter for supervision of normal pregnancy, unspecified, third trimester: Secondary | ICD-10-CM

## 2020-07-14 DIAGNOSIS — Z6791 Unspecified blood type, Rh negative: Secondary | ICD-10-CM | POA: Diagnosis not present

## 2020-07-14 DIAGNOSIS — Z331 Pregnant state, incidental: Secondary | ICD-10-CM

## 2020-07-14 DIAGNOSIS — Z3480 Encounter for supervision of other normal pregnancy, unspecified trimester: Secondary | ICD-10-CM

## 2020-07-14 DIAGNOSIS — Z3A33 33 weeks gestation of pregnancy: Secondary | ICD-10-CM

## 2020-07-14 DIAGNOSIS — Z1389 Encounter for screening for other disorder: Secondary | ICD-10-CM

## 2020-07-14 DIAGNOSIS — O99013 Anemia complicating pregnancy, third trimester: Secondary | ICD-10-CM | POA: Diagnosis not present

## 2020-07-14 DIAGNOSIS — O26893 Other specified pregnancy related conditions, third trimester: Secondary | ICD-10-CM | POA: Diagnosis not present

## 2020-07-14 LAB — POCT HEMOGLOBIN: Hemoglobin: 11.1 g/dL (ref 11–14.6)

## 2020-07-14 LAB — POCT URINALYSIS DIPSTICK OB
Blood, UA: NEGATIVE
Glucose, UA: NEGATIVE
Ketones, UA: NEGATIVE
Leukocytes, UA: NEGATIVE
Nitrite, UA: NEGATIVE
POC,PROTEIN,UA: NEGATIVE

## 2020-07-14 NOTE — Progress Notes (Signed)
    WORK IN FOR PAIN/PRESSURE  LOW-RISK PREGNANCY VISIT Patient name: Meagan [name] MRN [id number]  Date of birth: July 28, 1990 Chief Complaint:   Routine Prenatal Visit (Having irregular contractions; throbbing back pain)  History of Present Illness:   Meagan [name] is a 30 y.o. [phone] female at [redacted]w[redacted]d with an Estimated Date of Delivery: 08/29/20 being seen today for ongoing management of a low-risk pregnancy.  Today she reports "Really tightening in my stomach and a pushing feeling w/the tightening; since yesterday. Contractions: Irregular. Vag. Bleeding: None.  Movement: Present. denies leaking of fluid. Review of Systems:   Pertinent items are noted in HPI Denies abnormal vaginal discharge w/ itching/odor/irritation, headaches, visual changes, shortness of breath, chest pain, abdominal pain, severe nausea/vomiting, or problems with urination or bowel movements unless otherwise stated above. Pertinent History Reviewed:  Reviewed past medical,surgical, social, obstetrical and family history.  Reviewed problem list, medications and allergies. Physical Assessment:   Vitals:   07/14/20 0938  BP: 126/82  Pulse: (!) 101  Weight: 189 lb (85.7 kg)  Body mass index is 33.48 kg/m.        Physical Examination:   General appearance: Well appearing, and in no distress  Mental status: Alert, oriented to person, place, and time  Skin: Warm & dry  Cardiovascular: Normal heart rate noted  Respiratory: Normal respiratory effort, no distress  Abdomen: Soft, gravid, nontender  Pelvic: Cervical exam performed  Dilation: 1 Effacement (%): Thick Station: Ballotable  Extremities: Edema: Trace  Fetal Status: Fetal Heart Rate (bpm): 131   Movement: Present Presentation: Vertex  Chaperone: Malachy Mood    Results for orders placed or performed in visit on 07/14/20 (from the past 24 hour(s))  POC Urinalysis Dipstick OB   Collection Time: 07/14/20  9:39 AM  Result Value Ref Range   Color,  UA     Clarity, UA     Glucose, UA Negative Negative   Bilirubin, UA     Ketones, UA neg    Spec Grav, UA     Blood, UA neg    pH, UA     POC,PROTEIN,UA Negative Negative, Trace, Small (1+), Moderate (2+), Large (3+), 4+   Urobilinogen, UA     Nitrite, UA neg    Leukocytes, UA Negative Negative   Appearance     Odor    POCT hemoglobin   Collection Time: 07/14/20  9:48 AM  Result Value Ref Range   Hemoglobin 11.1 11 - 14.6 g/dL    Assessment & Plan:  1) Low-risk pregnancy [id number] at [redacted]w[redacted]d with an Estimated Date of Delivery: 08/29/20   2) BH ctx  3) anemia, continue iron QOD   Meds: No orders of the defined types were placed in this encounter.  Labs/procedures today: none  Plan:  Continue routine obstetrical care  Next visit: prefers in person    Reviewed: Preterm labor symptoms and general obstetric precautions including but not limited to vaginal bleeding, contractions, leaking of fluid and fetal movement were reviewed in detail with the patient.  All questions were answered. Has home bp cuff. . Check bp weekly, let us know if >140/90.   Follow-up: Return for As scheduled.  Orders Placed This Encounter  Procedures  . RHO (D) Immune Globulin  . POC Urinalysis Dipstick OB  . POCT hemoglobin   Jacklyn Shell DNP, CNM 07/14/2020 10:08 AM

## 2020-07-22 ENCOUNTER — Ambulatory Visit (INDEPENDENT_AMBULATORY_CARE_PROVIDER_SITE_OTHER): Payer: Medicaid Other | Admitting: Women's Health

## 2020-07-22 ENCOUNTER — Other Ambulatory Visit: Payer: Self-pay

## 2020-07-22 ENCOUNTER — Encounter: Payer: Self-pay | Admitting: Women's Health

## 2020-07-22 VITALS — BP 122/74 | HR 101 | Wt 191.0 lb

## 2020-07-22 DIAGNOSIS — Z3483 Encounter for supervision of other normal pregnancy, third trimester: Secondary | ICD-10-CM

## 2020-07-22 DIAGNOSIS — O26899 Other specified pregnancy related conditions, unspecified trimester: Secondary | ICD-10-CM

## 2020-07-22 DIAGNOSIS — Z6791 Unspecified blood type, Rh negative: Secondary | ICD-10-CM

## 2020-07-22 DIAGNOSIS — Z3480 Encounter for supervision of other normal pregnancy, unspecified trimester: Secondary | ICD-10-CM

## 2020-07-22 DIAGNOSIS — O26843 Uterine size-date discrepancy, third trimester: Secondary | ICD-10-CM

## 2020-07-22 DIAGNOSIS — F1290 Cannabis use, unspecified, uncomplicated: Secondary | ICD-10-CM

## 2020-07-22 NOTE — Progress Notes (Signed)
LOW-RISK PREGNANCY VISIT Patient name: Meagan [name] MRN [id number]  Date of birth: 1991-03-01 Chief Complaint:   Routine Prenatal Visit (Wants hgb check. Last checked 1-6)  History of Present Illness:   Meagan [name] is a 30 y.o. [phone] female at [redacted]w[redacted]d with an Estimated Date of Delivery: 08/29/20 being seen today for ongoing management of a low-risk pregnancy.  Depression screen [facility] 2/9 02/10/2020 01/06/2019 11/13/2018 06/26/2017  Decreased Interest 1 0 0 2  Down, Depressed, Hopeless 2 0 0 2  PHQ - 2 Score 3 0 0 4  Altered sleeping 2 0 - 1  Tired, decreased energy 2 0 - 3  Change in appetite 2 0 - 0  Feeling bad or failure about yourself  0 0 - 1  Trouble concentrating 1 0 - 3  Moving slowly or fidgety/restless 0 0 - 3  Suicidal thoughts 0 0 - 1  PHQ-9 Score 10 0 - 16  Difficult doing work/chores - - - Somewhat difficult  Some recent data might be hidden    Today she reports legs swelling. Wants to get hgb checked to make sure Fe working. Contractions: Irregular.  .  Movement: Present. denies leaking of fluid. Review of Systems:   Pertinent items are noted in HPI Denies abnormal vaginal discharge w/ itching/odor/irritation, headaches, visual changes, shortness of breath, chest pain, abdominal pain, severe nausea/vomiting, or problems with urination or bowel movements unless otherwise stated above. Pertinent History Reviewed:  Reviewed past medical,surgical, social, obstetrical and family history.  Reviewed problem list, medications and allergies. Physical Assessment:   Vitals:   07/22/20 1152  BP: 122/74  Pulse: (!) 101  Weight: 191 lb (86.6 kg)  Body mass index is 33.83 kg/m.        Physical Examination:   General appearance: Well appearing, and in no distress  Mental status: Alert, oriented to person, place, and time  Skin: Warm & dry  Cardiovascular: Normal heart rate noted  Respiratory: Normal respiratory effort, no distress  Abdomen: Soft, gravid,  nontender  Pelvic: Cervical exam deferred         Extremities: Edema: Trace  Fetal Status: Fetal Heart Rate (bpm): 130 Fundal Height: 37 cm Movement: Present    Fingerstick Hgb: 10.9  Chaperone: N/A   No results found for this or any previous visit (from the past 24 hour(s)).  Assessment & Plan:  1) Low-risk pregnancy [id number] at [redacted]w[redacted]d with an Estimated Date of Delivery: 08/29/20   2) Uterine size>dates, slightly, h/o poly, will get EFW @ next visit in 2wks  3) H/O pre-e> ASA, bp good  4) Prev c/s> then VBAC, wants another VBAC, to sign consent w/ MD  5) Anxiety> on pamelor and xanax 0.5mg    6) Anemia> fingerstick hgb 10.9, continue fe QOD   Meds: No orders of the defined types were placed in this encounter.  Labs/procedures today: hgb  Plan:  Continue routine obstetrical care  Next visit: prefers will be in person for gbs, u/s    Reviewed: Preterm labor symptoms and general obstetric precautions including but not limited to vaginal bleeding, contractions, leaking of fluid and fetal movement were reviewed in detail with the patient.  All questions were answered. Has home bp cuff.  Check bp weekly, let us know if >140/90.   Follow-up: Return in about 2 weeks (around 08/05/2020) for LROB, [name]: EFW, MD, in person (sign VBAC consent), .  Future Appointments  Date Time Provider Department Center  08/05/2020 10:15 AM [facility] [name]  [facility] None    Orders Placed This Encounter  Procedures  . US OB Follow Up   [name] CNM, [facility] 07/22/2020 12:33 PM

## 2020-07-22 NOTE — Patient Instructions (Signed)
Meagan [name], I greatly value your feedback.  If you receive a survey following your visit with [name] today, we appreciate you taking the time to fill it out.  Thanks, Meagan [name], CNM, WHNP-BC  [facility] ([address] [place], [place] [address]) Entrance C, located off of [address] valet parking   Go to [email or web address] to register for FREE online childbirth classes    Call the office [phone]) or go to [facility] if:  You begin to have strong, frequent contractions  Your water breaks.  Sometimes it is a big gush of fluid, sometimes it is just a trickle that keeps getting your panties wet or running down your legs  You have vaginal bleeding.  It is normal to have a small amount of spotting if your cervix was checked.   You don't feel your baby moving like normal.  If you don't, get you something to eat and drink and lay down and focus on feeling your baby move.  You should feel at least 10 movements in 2 hours.  If you don't, you should call the office or go to [facility].   Call the office [phone]) or go to [facility] hospital for these signs of pre-eclampsia:  Severe headache that does not go away with Tylenol  Visual changes- seeing spots, double, blurred vision  Pain under your right breast or upper abdomen that does not go away with Tums or heartburn medicine  Nausea and/or vomiting  Severe swelling in your hands, feet, and face    Home Blood Pressure Monitoring for Patients   Your provider has recommended that you check your blood pressure (BP) at least once a week at home. If you do not have a blood pressure cuff at home, one will be provided for you. Contact your provider if you have not received your monitor within 1 week.   Helpful Tips for Accurate Home Blood Pressure Checks  . Don't smoke, exercise, or drink caffeine 30 minutes before checking your BP . Use the restroom before checking your BP (a full  bladder can raise your pressure) . Relax in a comfortable upright chair . Feet on the ground . Left arm resting comfortably on a flat surface at the level of your heart . Legs uncrossed . Back supported . Sit quietly and don't talk . Place the cuff on your bare arm . Adjust snuggly, so that only two fingertips can fit between your skin and the top of the cuff . Check 2 readings separated by at least one minute . Keep a log of your BP readings . For a visual, please reference this diagram: http://ccnc.care/bpdiagram  Provider Name: [name]     Phone: [phone]  Zone 1: ALL CLEAR  Continue to monitor your symptoms:  . BP reading is less than 140 (top number) or less than 90 (bottom number)  . No right upper stomach pain . No headaches or seeing spots . No feeling nauseated or throwing up . No swelling in face and hands  Zone 2: CAUTION Call your doctor's office for any of the following:  . BP reading is greater than 140 (top number) or greater than 90 (bottom number)  . Stomach pain under your ribs in the middle or right side . Headaches or seeing spots . Feeling nauseated or throwing up . Swelling in face and hands  Zone 3: EMERGENCY  Seek immediate medical care if you have any of  the following:  . BP reading is greater than160 (top number) or greater than 110 (bottom number) . Severe headaches not improving with Tylenol . Serious difficulty catching your breath . Any worsening symptoms from Zone 2  Preterm Labor and Birth Information  The normal length of a pregnancy is 39-41 weeks. Preterm labor is when labor starts before 37 completed weeks of pregnancy. What are the risk factors for preterm labor? Preterm labor is more likely to occur in women who:  Have certain infections during pregnancy such as a bladder infection, sexually transmitted infection, or infection inside the uterus (chorioamnionitis).  Have a shorter-than-normal cervix.  Have gone into  preterm labor before.  Have had surgery on their cervix.  Are younger than age 54 or older than age 25.  Are African American.  Are pregnant with twins or multiple babies (multiple gestation).  Take street drugs or smoke while pregnant.  Do not gain enough weight while pregnant.  Became pregnant shortly after having been pregnant. What are the symptoms of preterm labor? Symptoms of preterm labor include:  Cramps similar to those that can happen during a menstrual period. The cramps may happen with diarrhea.  Pain in the abdomen or lower back.  Regular uterine contractions that may feel like tightening of the abdomen.  A feeling of increased pressure in the pelvis.  Increased watery or bloody mucus discharge from the vagina.  Water breaking (ruptured amniotic sac). Why is it important to recognize signs of preterm labor? It is important to recognize signs of preterm labor because babies who are born prematurely may not be fully developed. This can put them at an increased risk for:  Long-term (chronic) heart and lung problems.  Difficulty immediately after birth with regulating body systems, including blood sugar, body temperature, heart rate, and breathing rate.  Bleeding in the brain.  Cerebral palsy.  Learning difficulties.  Death. These risks are highest for babies who are born before 48 weeks of pregnancy. How is preterm labor treated? Treatment depends on the length of your pregnancy, your condition, and the health of your baby. It may involve: 1. Having a stitch (suture) placed in your cervix to prevent your cervix from opening too early (cerclage). 2. Taking or being given medicines, such as: ? Hormone medicines. These may be given early in pregnancy to help support the pregnancy. ? Medicine to stop contractions. ? Medicines to help mature the baby's lungs. These may be prescribed if the risk of delivery is high. ? Medicines to prevent your baby from  developing cerebral palsy. If the labor happens before 34 weeks of pregnancy, you may need to stay in the hospital. What should I do if I think I am in preterm labor? If you think that you are going into preterm labor, call your health care provider right away. How can I prevent preterm labor in future pregnancies? To increase your chance of having a full-term pregnancy:  Do not use any tobacco products, such as cigarettes, chewing tobacco, and e-cigarettes. If you need help quitting, ask your health care provider.  Do not use street drugs or medicines that have not been prescribed to you during your pregnancy.  Talk with your health care provider before taking any herbal supplements, even if you have been taking them regularly.  Make sure you gain a healthy amount of weight during your pregnancy.  Watch for infection. If you think that you might have an infection, get it checked right away.  Make sure to  tell your health care provider if you have gone into preterm labor before. This information is not intended to replace advice given to you by your health care provider. Make sure you discuss any questions you have with your health care provider. Document Revised: 10/10/2018 Document Reviewed: 11/09/2015 Elsevier Patient Education  [place].

## 2020-08-01 ENCOUNTER — Telehealth: Payer: Self-pay | Admitting: Women's Health

## 2020-08-01 ENCOUNTER — Other Ambulatory Visit: Payer: Self-pay

## 2020-08-01 ENCOUNTER — Inpatient Hospital Stay (HOSPITAL_COMMUNITY)
Admission: AD | Admit: 2020-08-01 | Discharge: 2020-08-01 | Disposition: A | Discharge status: Home / Self Care | Payer: Medicaid Other | Attending: Obstetrics and Gynecology | Admitting: Obstetrics and Gynecology

## 2020-08-01 ENCOUNTER — Ambulatory Visit (INDEPENDENT_AMBULATORY_CARE_PROVIDER_SITE_OTHER): Payer: Medicaid Other

## 2020-08-01 ENCOUNTER — Encounter (HOSPITAL_COMMUNITY): Payer: Self-pay | Admitting: Obstetrics and Gynecology

## 2020-08-01 VITALS — BP 136/84 | HR 103 | Wt 190.8 lb

## 2020-08-01 DIAGNOSIS — Z1389 Encounter for screening for other disorder: Secondary | ICD-10-CM | POA: Diagnosis not present

## 2020-08-01 DIAGNOSIS — O26893 Other specified pregnancy related conditions, third trimester: Secondary | ICD-10-CM | POA: Insufficient documentation

## 2020-08-01 DIAGNOSIS — M549 Dorsalgia, unspecified: Secondary | ICD-10-CM | POA: Diagnosis not present

## 2020-08-01 DIAGNOSIS — M7918 Myalgia, other site: Secondary | ICD-10-CM | POA: Diagnosis not present

## 2020-08-01 DIAGNOSIS — F17210 Nicotine dependence, cigarettes, uncomplicated: Secondary | ICD-10-CM | POA: Diagnosis not present

## 2020-08-01 DIAGNOSIS — O99891 Other specified diseases and conditions complicating pregnancy: Secondary | ICD-10-CM | POA: Diagnosis not present

## 2020-08-01 DIAGNOSIS — O99333 Smoking (tobacco) complicating pregnancy, third trimester: Secondary | ICD-10-CM | POA: Insufficient documentation

## 2020-08-01 DIAGNOSIS — Z3A36 36 weeks gestation of pregnancy: Secondary | ICD-10-CM | POA: Insufficient documentation

## 2020-08-01 DIAGNOSIS — Z0130 Encounter for examination of blood pressure without abnormal findings: Secondary | ICD-10-CM

## 2020-08-01 LAB — POCT URINALYSIS DIPSTICK OB
Blood, UA: NEGATIVE
Glucose, UA: NEGATIVE
Ketones, UA: NEGATIVE
Leukocytes, UA: NEGATIVE
Nitrite, UA: NEGATIVE
POC,PROTEIN,UA: NEGATIVE

## 2020-08-01 LAB — URINALYSIS, ROUTINE W REFLEX MICROSCOPIC
Bilirubin Urine: NEGATIVE
Glucose, UA: NEGATIVE mg/dL
Hgb urine dipstick: NEGATIVE
Ketones, ur: NEGATIVE mg/dL
Nitrite: NEGATIVE
Protein, ur: NEGATIVE mg/dL
Specific Gravity, Urine: 1.017 (ref 1.005–1.030)
pH: 5 (ref 5.0–8.0)

## 2020-08-01 MED ORDER — CYCLOBENZAPRINE HCL 5 MG PO TABS
10.0000 mg | ORAL_TABLET | Freq: Once | ORAL | Status: AC
  Administered 2020-08-01: 10 mg via ORAL
  Filled 2020-08-01: qty 2

## 2020-08-01 MED ORDER — CYCLOBENZAPRINE HCL 10 MG PO TABS: 10.0000 mg | ORAL_TABLET | Freq: Three times a day (TID) | ORAL | 0 refills | Status: DC | PRN

## 2020-08-01 NOTE — Progress Notes (Addendum)
   NURSE VISIT- BLOOD PRESSURE CHECK  SUBJECTIVE:  Meagan [name] is a 30 y.o. [phone] female here for BP check. She is [redacted]w[redacted]d pregnant    HYPERTENSION ROS:  Pregnant/postpartum:  . Severe headaches that don't go away with tylenol/other medicines: No  . Visual changes (seeing spots/double/blurred vision) No  . Severe pain under right breast breast or in center of upper chest No  . Severe nausea/vomiting No  . Taking medicines as instructed yes  OBJECTIVE:  BP 136/84 (BP Location: Right Arm, Patient Position: Sitting, Cuff Size: Normal)   Pulse (!) 103   Wt 190 lb 12.8 oz (86.5 kg)   LMP 11/23/2019   BMI 33.80 kg/m   Appearance alert, well appearing, and in no distress, oriented to person, place, and time and normal appearing weight.  ASSESSMENT: Pregnancy [redacted]w[redacted]d  blood pressure check  PLAN: Discussed with [name], CNM, [facility]   Recommendations: no changes needed   Follow-up: as scheduled   Reigna Ruperto A Jena Tegeler  08/01/2020 2:10 PM   Chart reviewed for nurse visit. Agree with plan of care.  [name], [place] 08/01/2020 3:21 PM

## 2020-08-01 NOTE — MAU Note (Signed)
..  Meagan [name] is a 29 y.o. at [redacted]w[redacted]d here in MAU reporting: Right flank pain that occasionally radiates to her back and elevated blood pressure.  Patient states she went to office today and her blood pressure was elevated and was told to monitor it, when she was at home her blood pressure was 156/102. Denies headache, visual disturbances, or epigastric pain.  The flank pain feels sharp and "bruise-like" began about a week ago and is aggravated by coughing and vomiting.  Reports occasional contractions but not regular. Denies vaginal bleeding or leaking of fluid. +FM. Pain score: 6/10 Vitals:   08/01/20 1919  BP: 127/71  Pulse: (!) 102  Resp: 16  Temp: 98.6 F (37 C)  SpO2: 99%     FHT: 140 Lab orders placed from triage: UA

## 2020-08-01 NOTE — Telephone Encounter (Signed)
Pt called around 4:00, states her face felt hot & her vision was blurry so she checked her BP 149/102 then 150/96 Spoke with the nurse then advised pt to go to the ER @ [facility] per nurse

## 2020-08-01 NOTE — MAU Provider Note (Signed)
History     CSN: [id number]  Arrival date and time: 08/01/20 1851   None     Chief Complaint  Patient presents with  . Flank Pain  . Hypertension   HPI   Meagan [name] is a 30 y.o. female [phone] @ [redacted]w[redacted]d here with Right flank pain and elevated BP. The flank pain worsens when she strains or coughs. She reports a "smokers" cough every morning which makes her gag. She reports straining to cough and gag each morning and has done this for a "long time".  No history of trauma to her back or flank area. No dysuria or urinary complaints. She was treated for pyelo in the pregnancy, however this does not feel the same.  "The inside of the flank feels bruised".  She reports elevated BP readings from home cuff today 156/102.  She has a history of Pre E, however no CHTN or elevated BP's in this pregnancy.    OB History    Gravida  6   Para  5   Term  4   Preterm  1   AB  0   Living  5     SAB  0   IAB  0   Ectopic  0   Multiple  0   Live Births  5        Obstetric Comments  G4=LTCS (not vertical) per op note        Past Medical History:  Diagnosis Date  . Abdominal cramps 12/06/2015  . Anemia   . Anxiety   . Asthma    inhaler at bedside  . Daily headache   . Diarrhea 06/04/2013  . Hemiplegic migraine    blurred vision, nausea, dizziness, numbness  . Kidney stones   . Mental disorder    huffed inhalants "affected brain"  . Pyelonephritis complicating pregnancy, antepartum, first trimester 11/05/2011  . Stomach ulcer     Past Surgical History:  Procedure Laterality Date  . ANKLE SURGERY Left 09/2016   plates & screws  . CESAREAN SECTION N/A 12/19/2017   Procedure: CESAREAN SECTION;  Surgeon: Lazaro Arms, MD;  Location: [facility] BIRTHING SUITES;  Service: Obstetrics;  Laterality: N/A;    Family History  Problem Relation Age of Onset  . Cancer Maternal Grandmother        breast  . Cancer Paternal Grandfather        lung cancer  . Migraines Father    . Hypertension Father   . Hypercholesterolemia Father   . Other Son        ventricular defect  . Hypertension Other     Social History   Tobacco Use  . Smoking status: Current Every Day Smoker    Packs/day: 1.00    Years: 7.00    Pack years: 7.00    Types: Cigarettes  . Smokeless tobacco: Never Used  Vaping Use  . Vaping Use: Former  Substance Use Topics  . Alcohol use: Not Currently    Alcohol/week: 0.0 standard drinks    Comment: occ; not now  . Drug use: Not Currently    Types: Other-see comments, Marijuana    Comment: huffed inhalants in past, "affected Brain"; as of 08/01/20 "oncce in the last 30 days"    Allergies:  Allergies  Allergen Reactions  . Codeine Nausea And Vomiting and Other (See Comments)    Vertigo  . Penicillins Rash and Other (See Comments)    Unknown:  Childhood reaction.  Has patient had a  PCN reaction causing immediate rash, facial/tongue/throat swelling, SOB or lightheadedness with hypotension: Unknown Has patient had a PCN reaction causing severe rash involving mucus membranes or skin necrosis: Unknown Has patient had a PCN reaction that required hospitalization No Has patient had a PCN reaction occurring within the last 10 years: No If all of the above answers are "NO", then may pro  . Sulfamethoxazole-Trimethoprim Hives    Medications Prior to Admission  Medication Sig Dispense Refill Last Dose  . albuterol (VENTOLIN HFA) 108 (90 Base) MCG/ACT inhaler Inhale 1-2 puffs into the lungs every 6 (six) hours as needed for wheezing or shortness of breath. 8 g 2 Past Month at Unknown time  . ALPRAZolam (XANAX) 0.5 MG tablet 3 times daily as needed for anxiety and/or sleep 90 tablet 3 08/01/2020 at Unknown time  . Blood Pressure Monitor MISC For regular home bp monitoring during pregnancy 1 each 0 08/01/2020 at Unknown time  . butalbital-acetaminophen-caffeine (FIORICET) 50-325-40 MG tablet TAKE 1 TABLET BY MOUTH EVERY 6 HOURS AS NEEDED FOR HEADACHE 20  tablet 0 Past Month at Unknown time  . nortriptyline (PAMELOR) 25 MG capsule Take 1 capsule (25 mg total) by mouth at bedtime. 90 capsule 0 07/31/2020 at Unknown time  . omeprazole (PRILOSEC) 20 MG capsule Take 1 capsule (20 mg total) by mouth daily. 30 capsule 6 Past Month at Unknown time  . Prenatal Vit-Fe Fumarate-FA (PREPLUS) 27-1 MG TABS TAKE ONE TABLET BY MOUTH AT 12 NOON. 30 tablet prn 08/01/2020 at Unknown time  . ferrous sulfate 325 (65 FE) MG tablet Take 1 tablet (325 mg total) by mouth every other day. (Patient taking differently: Take 325 mg by mouth daily.) 45 tablet 2    Results for orders placed or performed during the hospital encounter of 08/01/20 (from the past 48 hour(s))  Urinalysis, Routine w reflex microscopic Urine, Clean Catch     Status: Abnormal   Collection Time: 08/01/20  7:46 PM  Result Value Ref Range   Color, Urine YELLOW YELLOW   APPearance CLOUDY (A) CLEAR   Specific Gravity, Urine 1.017 1.005 - 1.030   pH 5.0 5.0 - 8.0   Glucose, UA NEGATIVE NEGATIVE mg/dL   Hgb urine dipstick NEGATIVE NEGATIVE   Bilirubin Urine NEGATIVE NEGATIVE   Ketones, ur NEGATIVE NEGATIVE mg/dL   Protein, ur NEGATIVE NEGATIVE mg/dL   Nitrite NEGATIVE NEGATIVE   Leukocytes,Ua TRACE (A) NEGATIVE   WBC, UA 11-20 0 - 5 WBC/hpf   Bacteria, UA FEW (A) NONE SEEN   Squamous Epithelial / LPF 21-50 0 - 5   Mucus PRESENT    Ca Oxalate Crys, UA PRESENT     Comment: Performed at [facility], [address] [place], [place] [address]   Review of Systems  Constitutional: Positive for fever.  Eyes: Negative for photophobia and visual disturbance.  Gastrointestinal: Negative for abdominal pain and nausea.  Genitourinary: Positive for flank pain.  Musculoskeletal: Positive for back pain.  Neurological: Negative for headaches.   Physical Exam   Blood pressure 137/76, pulse 97, temperature 98.6 F (37 C), temperature source Oral, resp. rate 16, height 5\' 2"  (1.575 m), weight 87.1 kg,  last menstrual period 11/23/2019, SpO2 99 %, not currently breastfeeding.  Patient Vitals for the past 24 hrs:  BP Temp Temp src Pulse Resp SpO2 Height Weight  08/01/20 2117 122/74 - - 90 - - - -  08/01/20 2006 129/75 - - 93 - - - -  08/01/20 1937 137/76 - -  97 - - - -  08/01/20 1919 127/71 98.6 F (37 C) Oral (!) 102 16 99 % 5\' 2"  (1.575 m) 87.1 kg    Physical Exam Constitutional:      General: She is not in acute distress.    Appearance: Normal appearance. She is not ill-appearing, toxic-appearing or diaphoretic.  HENT:     Head: Normocephalic.  Abdominal:     Tenderness: There is no right CVA tenderness or left CVA tenderness.  Genitourinary:    Comments: Dilation: 2.5 Effacement (%): Thick Exam by:: [email or web address] NP Neurological:     Mental Status: She is alert and oriented to person, place, and time.     Deep Tendon Reflexes: Reflexes normal.     Comments: Negative clonus   Psychiatric:        Behavior: Behavior normal.    Fetal Tracing: Baseline: 125 Bpm Variability: Moderate  Accelerations: 15x15 Decelerations: None Toco: Occasional with UI  MAU Course  Procedures  None  MDM  BP's grossly normal  No CVA tenderness reproduced on exam UA with large Squamous, Urine culture ordered and pending Flexeril given 10 mg PO prior to DC  Cervix is unchanged from previous office exams.   Assessment and Plan   A:  1. Musculoskeletal pain   2. [redacted] weeks gestation of pregnancy   3. BP check     P:  Discharge home in stable condition Message sent to [facility] for BP on Weds, patient instructed to bring her cuff with her.  Rx: Flexeril  Alternate heat/ice Return to MAU for fever or worsening pain Urine culture pending.  [facility], NP 08/01/2020 9:43 PM

## 2020-08-02 ENCOUNTER — Encounter: Payer: Self-pay | Admitting: *Deleted

## 2020-08-02 ENCOUNTER — Other Ambulatory Visit: Payer: Self-pay | Admitting: Women's Health

## 2020-08-02 MED ORDER — BUTALBITAL-APAP-CAFFEINE 50-325-40 MG PO TABS: ORAL_TABLET | ORAL | 0 refills | Status: DC

## 2020-08-03 ENCOUNTER — Telehealth (INDEPENDENT_AMBULATORY_CARE_PROVIDER_SITE_OTHER): Payer: Medicaid Other | Admitting: *Deleted

## 2020-08-03 ENCOUNTER — Telehealth: Payer: Self-pay | Admitting: *Deleted

## 2020-08-03 ENCOUNTER — Telehealth: Payer: Self-pay | Admitting: Certified Nurse Midwife

## 2020-08-03 DIAGNOSIS — F1290 Cannabis use, unspecified, uncomplicated: Secondary | ICD-10-CM

## 2020-08-03 DIAGNOSIS — Z3480 Encounter for supervision of other normal pregnancy, unspecified trimester: Secondary | ICD-10-CM

## 2020-08-03 DIAGNOSIS — Z0130 Encounter for examination of blood pressure without abnormal findings: Secondary | ICD-10-CM

## 2020-08-03 LAB — CULTURE, OB URINE: Culture: >=100000 — AB

## 2020-08-03 NOTE — Progress Notes (Addendum)
   I connected with  Meagan [name] on 08/03/20 by a video enabled telemedicine application and verified that I am speaking with the correct person using two identifiers.   I discussed the limitations of evaluation and management by telemedicine. The patient expressed understanding and agreed to proceed.   NURSE VISIT- BLOOD PRESSURE CHECK  SUBJECTIVE:  Meagan [name] is a 30 y.o. [phone] female here for BP check. She is [redacted]w[redacted]d pregnant    HYPERTENSION ROS:  Pregnant:  . Severe headaches that don't go away with tylenol/other medicines: No  . Visual changes (seeing spots/double/blurred vision) No  . Severe pain under right breast breast or in center of upper chest No  . Severe nausea/vomiting No  . Taking medicines as instructed not applicable  .   OBJECTIVE:  BP (!) 142/87   Pulse (!) 111   LMP 11/23/2019   Appearance alert, well appearing, and in no distress and oriented to person, place, and time.  ASSESSMENT: Pregnancy [redacted]w[redacted]d  blood pressure check  PLAN: Discussed with [name], CNM, [facility]   Recommendations: no changes needed   Follow-up: as scheduled   Jobe Marker  08/03/2020 3:39 PM   Chart reviewed for nurse visit. Agree with plan of care. Asymptomatic, check home bp's, keep appt on Fri as scheduled. RN reviewed pre-e s/s, reasons to seek care. [name], [place] 08/03/2020 5:08 PM

## 2020-08-03 NOTE — Telephone Encounter (Signed)
Pt called via nurse line to inquire about antibiotics for the lactobacillus in her urine since she is still having back pain. Explained that lactobacillus is a normal finding and does not warrant antibiotic therapy. Pt verbalized understanding, has follow up scheduled at [facility] on Friday, 08/05/20.  Edd Arbour, CNM, MSN, IBCLC Certified Nurse Midwife, [facility]

## 2020-08-03 NOTE — Telephone Encounter (Signed)
Patient states she has noticed some mucous with light blood this morning when she went to the bathroom.  States it has a snotty consistency.  Informed it sounded most like her mucous plug which can contain blood.  Reports irregular contractions and good fetal movement.  Advised to continue to monitor and if bleeding increased, to let us know.  Verbalized understanding.

## 2020-08-05 ENCOUNTER — Encounter: Payer: Medicaid Other | Admitting: Women's Health

## 2020-08-05 ENCOUNTER — Ambulatory Visit (INDEPENDENT_AMBULATORY_CARE_PROVIDER_SITE_OTHER): Payer: Medicaid Other

## 2020-08-05 ENCOUNTER — Other Ambulatory Visit (HOSPITAL_COMMUNITY)
Admission: RE | Admit: 2020-08-05 | Discharge: 2020-08-05 | Disposition: A | Discharge status: Home / Self Care | Payer: Medicaid Other | Source: Ambulatory Visit | Attending: Obstetrics & Gynecology | Admitting: Obstetrics & Gynecology

## 2020-08-05 ENCOUNTER — Other Ambulatory Visit: Payer: Self-pay

## 2020-08-05 ENCOUNTER — Other Ambulatory Visit: Payer: Medicaid Other

## 2020-08-05 ENCOUNTER — Encounter: Payer: Self-pay | Admitting: Women's Health

## 2020-08-05 ENCOUNTER — Encounter: Payer: Medicaid Other | Admitting: Obstetrics & Gynecology

## 2020-08-05 ENCOUNTER — Ambulatory Visit (INDEPENDENT_AMBULATORY_CARE_PROVIDER_SITE_OTHER): Payer: Medicaid Other | Admitting: Women's Health

## 2020-08-05 VITALS — BP 116/76 | HR 96 | Wt 191.2 lb

## 2020-08-05 DIAGNOSIS — F1290 Cannabis use, unspecified, uncomplicated: Secondary | ICD-10-CM

## 2020-08-05 DIAGNOSIS — O26843 Uterine size-date discrepancy, third trimester: Secondary | ICD-10-CM

## 2020-08-05 DIAGNOSIS — Z3483 Encounter for supervision of other normal pregnancy, third trimester: Secondary | ICD-10-CM | POA: Diagnosis not present

## 2020-08-05 DIAGNOSIS — Z113 Encounter for screening for infections with a predominantly sexual mode of transmission: Secondary | ICD-10-CM | POA: Insufficient documentation

## 2020-08-05 DIAGNOSIS — Z3A36 36 weeks gestation of pregnancy: Secondary | ICD-10-CM | POA: Insufficient documentation

## 2020-08-05 DIAGNOSIS — Z3480 Encounter for supervision of other normal pregnancy, unspecified trimester: Secondary | ICD-10-CM

## 2020-08-05 NOTE — Patient Instructions (Signed)
Meagan [name], I greatly value your feedback.  If you receive a survey following your visit with [name] today, we appreciate you taking the time to fill it out.  Thanks, [name], CNM, WHNP-BC  [facility] ([address] [place], [place] [address]) Entrance C, located off of [address] valet parking   Go to [email or web address] to register for FREE online childbirth classes    Call the office [phone]) or go to [facility] if:  You begin to have strong, frequent contractions  Your water breaks.  Sometimes it is a big gush of fluid, sometimes it is just a trickle that keeps getting your panties wet or running down your legs  You have vaginal bleeding.  It is normal to have a small amount of spotting if your cervix was checked.   You don't feel your baby moving like normal.  If you don't, get you something to eat and drink and lay down and focus on feeling your baby move.  You should feel at least 10 movements in 2 hours.  If you don't, you should call the office or go to [facility].   Call the office [phone]) or go to [facility] hospital for these signs of pre-eclampsia:  Severe headache that does not go away with Tylenol  Visual changes- seeing spots, double, blurred vision  Pain under your right breast or upper abdomen that does not go away with Tums or heartburn medicine  Nausea and/or vomiting  Severe swelling in your hands, feet, and face    Home Blood Pressure Monitoring for Patients   Your provider has recommended that you check your blood pressure (BP) at least once a week at home. If you do not have a blood pressure cuff at home, one will be provided for you. Contact your provider if you have not received your monitor within 1 week.   Helpful Tips for Accurate Home Blood Pressure Checks  . Don't smoke, exercise, or drink caffeine 30 minutes before checking your BP . Use the restroom before checking your BP (a full  bladder can raise your pressure) . Relax in a comfortable upright chair . Feet on the ground . Left arm resting comfortably on a flat surface at the level of your heart . Legs uncrossed . Back supported . Sit quietly and don't talk . Place the cuff on your bare arm . Adjust snuggly, so that only two fingertips can fit between your skin and the top of the cuff . Check 2 readings separated by at least one minute . Keep a log of your BP readings . For a visual, please reference this diagram: http://ccnc.care/bpdiagram  Provider Name: [name]     Phone: [phone]  Zone 1: ALL CLEAR  Continue to monitor your symptoms:  . BP reading is less than 140 (top number) or less than 90 (bottom number)  . No right upper stomach pain . No headaches or seeing spots . No feeling nauseated or throwing up . No swelling in face and hands  Zone 2: CAUTION Call your doctor's office for any of the following:  . BP reading is greater than 140 (top number) or greater than 90 (bottom number)  . Stomach pain under your ribs in the middle or right side . Headaches or seeing spots . Feeling nauseated or throwing up . Swelling in face and hands  Zone 3: EMERGENCY  Seek immediate medical care if you have any of  the following:  . BP reading is greater than160 (top number) or greater than 110 (bottom number) . Severe headaches not improving with Tylenol . Serious difficulty catching your breath . Any worsening symptoms from Zone 2   Braxton Hicks Contractions Contractions of the uterus can occur throughout pregnancy, but they are not always a sign that you are in labor. You may have practice contractions called Braxton Hicks contractions. These false labor contractions are sometimes confused with true labor. What are Braxton Hicks contractions? Braxton Hicks contractions are tightening movements that occur in the muscles of the uterus before labor. Unlike true labor contractions, these  contractions do not result in opening (dilation) and thinning of the cervix. Toward the end of pregnancy (32-34 weeks), Braxton Hicks contractions can happen more often and may become stronger. These contractions are sometimes difficult to tell apart from true labor because they can be very uncomfortable. You should not feel embarrassed if you go to the hospital with false labor. Sometimes, the only way to tell if you are in true labor is for your health care provider to look for changes in the cervix. The health care provider will do a physical exam and may monitor your contractions. If you are not in true labor, the exam should show that your cervix is not dilating and your water has not broken. If there are no other health problems associated with your pregnancy, it is completely safe for you to be sent home with false labor. You may continue to have Braxton Hicks contractions until you go into true labor. How to tell the difference between true labor and false labor True labor  Contractions last 30-70 seconds.  Contractions become very regular.  Discomfort is usually felt in the top of the uterus, and it spreads to the lower abdomen and low back.  Contractions do not go away with walking.  Contractions usually become more intense and increase in frequency.  The cervix dilates and gets thinner. False labor  Contractions are usually shorter and not as strong as true labor contractions.  Contractions are usually irregular.  Contractions are often felt in the front of the lower abdomen and in the groin.  Contractions may go away when you walk around or change positions while lying down.  Contractions get weaker and are shorter-lasting as time goes on.  The cervix usually does not dilate or become thin. Follow these instructions at home:  1. Take over-the-counter and prescription medicines only as told by your health care provider. 2. Keep up with your usual exercises and follow other  instructions from your health care provider. 3. Eat and drink lightly if you think you are going into labor. 4. If Braxton Hicks contractions are making you uncomfortable: ? Change your position from lying down or resting to walking, or change from walking to resting. ? Sit and rest in a tub of warm water. ? Drink enough fluid to keep your urine pale yellow. Dehydration may cause these contractions. ? Do slow and deep breathing several times an hour. 5. Keep all follow-up prenatal visits as told by your health care provider. This is important. Contact a health care provider if:  You have a fever.  You have continuous pain in your abdomen. Get help right away if:  Your contractions become stronger, more regular, and closer together.  You have fluid leaking or gushing from your vagina.  You pass blood-tinged mucus (bloody show).  You have bleeding from your vagina.  You have low back   pain that you never had before.  You feel your baby's head pushing down and causing pelvic pressure.  Your baby is not moving inside you as much as it used to. Summary  Contractions that occur before labor are called Braxton Hicks contractions, false labor, or practice contractions.  Braxton Hicks contractions are usually shorter, weaker, farther apart, and less regular than true labor contractions. True labor contractions usually become progressively stronger and regular, and they become more frequent.  Manage discomfort from [facility] contractions by changing position, resting in a warm bath, drinking plenty of water, or practicing deep breathing. This information is not intended to replace advice given to you by your health care provider. Make sure you discuss any questions you have with your health care provider. Document Revised: 05/31/2017 Document Reviewed: 11/01/2016 Elsevier Patient Education  [place].

## 2020-08-05 NOTE — Progress Notes (Signed)
[name] 36+4 wks,cephalic,anterior placenta gr 3,AFI 22.7 cm,fhr 133 bpm,EFW 2932 g 50%,AC 96%,FL 2.9%,HC 3.7%,BPD 1.4%

## 2020-08-05 NOTE — Progress Notes (Signed)
LOW-RISK PREGNANCY VISIT Patient name: Meagan [name] MRN [id number]  Date of birth: 05/23/1991 Chief Complaint:   Routine Prenatal Visit  History of Present Illness:   Meagan [name] is a 30 y.o. [id number] female at [redacted]w[redacted]d with an Estimated Date of Delivery: 08/29/20 being seen today for ongoing management of a low-risk pregnancy.  Depression screen [facility] 2/9 02/10/2020 01/06/2019 11/13/2018 06/26/2017  Decreased Interest 1 0 0 2  Down, Depressed, Hopeless 2 0 0 2  PHQ - 2 Score 3 0 0 4  Altered sleeping 2 0 - 1  Tired, decreased energy 2 0 - 3  Change in appetite 2 0 - 0  Feeling bad or failure about yourself  0 0 - 1  Trouble concentrating 1 0 - 3  Moving slowly or fidgety/restless 0 0 - 3  Suicidal thoughts 0 0 - 1  PHQ-9 Score 10 0 - 16  Difficult doing work/chores - - - Somewhat difficult  Some recent data might be hidden    Today she reports pinkish spotting the other day, none since.  Contractions: Irregular. Vag. Bleeding: Small.  Movement: Present. denies leaking of fluid. Review of Systems:   Pertinent items are noted in HPI Denies abnormal vaginal discharge w/ itching/odor/irritation, headaches, visual changes, shortness of breath, chest pain, abdominal pain, severe nausea/vomiting, or problems with urination or bowel movements unless otherwise stated above. Pertinent History Reviewed:  Reviewed past medical,surgical, social, obstetrical and family history.  Reviewed problem list, medications and allergies. Physical Assessment:   Vitals:   08/05/20 1132  BP: 116/76  Pulse: 96  Weight: 191 lb 3.2 oz (86.7 kg)  Body mass index is 34.97 kg/m.        Physical Examination:   General appearance: Well appearing, and in no distress  Mental status: Alert, oriented to person, place, and time  Skin: Warm & dry  Cardiovascular: Normal heart rate noted  Respiratory: Normal respiratory effort, no distress  Abdomen: Soft, gravid, nontender  Pelvic: Cervical exam  performed  Dilation: 2 Effacement (%): 60 Station: Ballotable by [employer], SNP  Extremities: Edema: Trace  Fetal Status: Fetal Heart Rate (bpm): 133 u/s Fundal Height: 36 cm Movement: Present Presentation: Vertex [name] 36+4 wks,cephalic,anterior placenta gr 3,AFI 22.7 cm,fhr 133 bpm,EFW 2932 g 50%,AC 96%,FL 2.9%,HC 3.7%,BPD 1.4%  Chaperone: me   No results found for this or any previous visit (from the past 24 hour(s)).  Assessment & Plan:  1) Low-risk pregnancy [id number] at [redacted]w[redacted]d with an Estimated Date of Delivery: 08/29/20   2) H/O pre-e, bp stable, reviewed pre-e s/s, reasons to seek care  3) Prev c/s> wants VBAC, needs consent w/ MD  4) H/O poly> AFI normal today 22.7cm  5) Anxiety> on xanax, pamelor   Meds: No orders of the defined types were placed in this encounter.  Labs/procedures today: gbs, gc/ct, sve, u/s  Plan:  Continue routine obstetrical care  Next visit: prefers will be in person for vbac consent w/ md    Reviewed: Preterm labor symptoms and general obstetric precautions including but not limited to vaginal bleeding, contractions, leaking of fluid and fetal movement were reviewed in detail with the patient.  All questions were answered. Has home bp cuff.  Check bp weekly, let us know if >140/90.   Follow-up: Return in about 1 week (around 08/12/2020) for LROB, MD only (to sign vbac consent), in person.  No future appointments.  Orders Placed This Encounter  Procedures  . Strep Gp B NAA+Rflx  [name] CNM, [facility] 08/05/2020 12:36 PM

## 2020-08-07 LAB — STREP GP B NAA+RFLX: Strep Gp B NAA+Rflx: NEGATIVE

## 2020-08-07 LAB — CERVICOVAGINAL ANCILLARY ONLY
Chlamydia: NEGATIVE
Comment: NEGATIVE
Comment: NORMAL
Neisseria Gonorrhea: NEGATIVE

## 2020-08-10 ENCOUNTER — Inpatient Hospital Stay (HOSPITAL_COMMUNITY): Payer: Medicaid Other | Admitting: Anesthesiology

## 2020-08-10 ENCOUNTER — Other Ambulatory Visit (INDEPENDENT_AMBULATORY_CARE_PROVIDER_SITE_OTHER): Payer: Medicaid Other

## 2020-08-10 ENCOUNTER — Inpatient Hospital Stay (HOSPITAL_COMMUNITY)
Admission: AD | Admit: 2020-08-10 | Discharge: 2020-08-12 | DRG: 806 | Disposition: A | Discharge status: Home / Self Care | Payer: Medicaid Other | Attending: Obstetrics & Gynecology | Admitting: Obstetrics & Gynecology

## 2020-08-10 ENCOUNTER — Other Ambulatory Visit: Payer: Self-pay

## 2020-08-10 ENCOUNTER — Encounter (HOSPITAL_COMMUNITY): Payer: Self-pay | Admitting: Obstetrics and Gynecology

## 2020-08-10 ENCOUNTER — Ambulatory Visit (INDEPENDENT_AMBULATORY_CARE_PROVIDER_SITE_OTHER): Payer: Medicaid Other | Admitting: Advanced Practice Midwife

## 2020-08-10 VITALS — BP 133/88 | HR 91 | Wt 190.0 lb

## 2020-08-10 DIAGNOSIS — F419 Anxiety disorder, unspecified: Secondary | ICD-10-CM | POA: Diagnosis present

## 2020-08-10 DIAGNOSIS — Z3483 Encounter for supervision of other normal pregnancy, third trimester: Secondary | ICD-10-CM | POA: Diagnosis not present

## 2020-08-10 DIAGNOSIS — O26893 Other specified pregnancy related conditions, third trimester: Secondary | ICD-10-CM | POA: Diagnosis present

## 2020-08-10 DIAGNOSIS — F17200 Nicotine dependence, unspecified, uncomplicated: Secondary | ICD-10-CM | POA: Diagnosis present

## 2020-08-10 DIAGNOSIS — Z20822 Contact with and (suspected) exposure to covid-19: Secondary | ICD-10-CM | POA: Diagnosis present

## 2020-08-10 DIAGNOSIS — O34211 Maternal care for low transverse scar from previous cesarean delivery: Secondary | ICD-10-CM | POA: Diagnosis present

## 2020-08-10 DIAGNOSIS — O26899 Other specified pregnancy related conditions, unspecified trimester: Secondary | ICD-10-CM

## 2020-08-10 DIAGNOSIS — O26843 Uterine size-date discrepancy, third trimester: Secondary | ICD-10-CM

## 2020-08-10 DIAGNOSIS — N898 Other specified noninflammatory disorders of vagina: Secondary | ICD-10-CM

## 2020-08-10 DIAGNOSIS — O99334 Smoking (tobacco) complicating childbirth: Secondary | ICD-10-CM | POA: Diagnosis present

## 2020-08-10 DIAGNOSIS — Z3A37 37 weeks gestation of pregnancy: Secondary | ICD-10-CM | POA: Diagnosis not present

## 2020-08-10 DIAGNOSIS — F1290 Cannabis use, unspecified, uncomplicated: Secondary | ICD-10-CM | POA: Diagnosis present

## 2020-08-10 DIAGNOSIS — Z98891 History of uterine scar from previous surgery: Secondary | ICD-10-CM

## 2020-08-10 DIAGNOSIS — O99344 Other mental disorders complicating childbirth: Secondary | ICD-10-CM | POA: Diagnosis present

## 2020-08-10 DIAGNOSIS — Z3009 Encounter for other general counseling and advice on contraception: Secondary | ICD-10-CM | POA: Diagnosis present

## 2020-08-10 DIAGNOSIS — Z6791 Unspecified blood type, Rh negative: Secondary | ICD-10-CM | POA: Diagnosis not present

## 2020-08-10 DIAGNOSIS — O4593 Premature separation of placenta, unspecified, third trimester: Principal | ICD-10-CM | POA: Diagnosis present

## 2020-08-10 DIAGNOSIS — F17210 Nicotine dependence, cigarettes, uncomplicated: Secondary | ICD-10-CM | POA: Diagnosis present

## 2020-08-10 DIAGNOSIS — Z3480 Encounter for supervision of other normal pregnancy, unspecified trimester: Secondary | ICD-10-CM

## 2020-08-10 DIAGNOSIS — O99324 Drug use complicating childbirth: Secondary | ICD-10-CM | POA: Diagnosis present

## 2020-08-10 DIAGNOSIS — O99013 Anemia complicating pregnancy, third trimester: Secondary | ICD-10-CM

## 2020-08-10 DIAGNOSIS — O09299 Supervision of pregnancy with other poor reproductive or obstetric history, unspecified trimester: Secondary | ICD-10-CM

## 2020-08-10 DIAGNOSIS — Z79899 Other long term (current) drug therapy: Secondary | ICD-10-CM

## 2020-08-10 LAB — COMPREHENSIVE METABOLIC PANEL
ALT: 14 U/L (ref 0–44)
AST: 14 U/L — ABNORMAL LOW (ref 15–41)
Albumin: 2.5 g/dL — ABNORMAL LOW (ref 3.5–5.0)
Alkaline Phosphatase: 80 U/L (ref 38–126)
Anion gap: 9 (ref 5–15)
BUN: 5 mg/dL — ABNORMAL LOW (ref 6–20)
CO2: 20 mmol/L — ABNORMAL LOW (ref 22–32)
Calcium: 8.4 mg/dL — ABNORMAL LOW (ref 8.9–10.3)
Chloride: 103 mmol/L (ref 98–111)
Creatinine, Ser: 0.58 mg/dL (ref 0.44–1.00)
GFR, Estimated: >60 mL/min (ref >60)
Glucose, Bld: 109 mg/dL — ABNORMAL HIGH (ref 70–99)
Potassium: 3.7 mmol/L (ref 3.5–5.1)
Sodium: 132 mmol/L — ABNORMAL LOW (ref 135–145)
Total Bilirubin: 0.4 mg/dL (ref 0.3–1.2)
Total Protein: 5.2 g/dL — ABNORMAL LOW (ref 6.5–8.1)

## 2020-08-10 LAB — PROTEIN / CREATININE RATIO, URINE
Creatinine, Urine: 132.7 mg/dL
Protein Creatinine Ratio: 0.2 mg/mg{Cre} — ABNORMAL HIGH (ref 0.00–0.15)
Total Protein, Urine: 27 mg/dL

## 2020-08-10 LAB — POCT URINALYSIS DIPSTICK OB
Glucose, UA: NEGATIVE
Ketones, UA: NEGATIVE
Leukocytes, UA: NEGATIVE
Nitrite, UA: NEGATIVE
POC,PROTEIN,UA: NEGATIVE

## 2020-08-10 LAB — CBC
HCT: 37.2 % (ref 36.0–46.0)
Hemoglobin: 12.3 g/dL (ref 12.0–15.0)
MCH: 31.3 pg (ref 26.0–34.0)
MCHC: 33.1 g/dL (ref 30.0–36.0)
MCV: 94.7 fL (ref 80.0–100.0)
Platelets: 185 10*3/uL (ref 150–400)
RBC: 3.93 MIL/uL (ref 3.87–5.11)
RDW: 14.7 % (ref 11.5–15.5)
WBC: 10 10*3/uL (ref 4.0–10.5)
nRBC: 0 % (ref 0.0–0.2)

## 2020-08-10 LAB — RESP PANEL BY RT-PCR (FLU A&B, COVID) ARPGX2
Influenza A by PCR: NEGATIVE
Influenza B by PCR: NEGATIVE
SARS Coronavirus 2 by RT PCR: NEGATIVE

## 2020-08-10 LAB — RAPID URINE DRUG SCREEN, HOSP PERFORMED
Amphetamines: NOT DETECTED
Barbiturates: NOT DETECTED
Benzodiazepines: POSITIVE — AB
Cocaine: NOT DETECTED
Opiates: NOT DETECTED
Tetrahydrocannabinol: POSITIVE — AB

## 2020-08-10 LAB — POCT HEMOGLOBIN: Hemoglobin: 11.6 g/dL (ref 11–14.6)

## 2020-08-10 LAB — TYPE AND SCREEN
ABO/RH(D): A NEG
Antibody Screen: POSITIVE

## 2020-08-10 MED ORDER — DIPHENHYDRAMINE HCL 50 MG/ML IJ SOLN: 12.5000 mg | INTRAMUSCULAR | Status: DC | PRN

## 2020-08-10 MED ORDER — LACTATED RINGERS IV SOLN
500.0000 mL | INTRAVENOUS | Status: DC | PRN
  Administered 2020-08-10: 500 mL via INTRAVENOUS

## 2020-08-10 MED ORDER — PHENYLEPHRINE 40 MCG/ML (10ML) SYRINGE FOR IV PUSH (FOR BLOOD PRESSURE SUPPORT)
PREFILLED_SYRINGE | INTRAVENOUS | Status: AC
  Filled 2020-08-10: qty 10

## 2020-08-10 MED ORDER — ACETAMINOPHEN 325 MG PO TABS: 650.0000 mg | ORAL_TABLET | ORAL | Status: DC | PRN

## 2020-08-10 MED ORDER — OXYCODONE-ACETAMINOPHEN 5-325 MG PO TABS: 2.0000 | ORAL_TABLET | ORAL | Status: DC | PRN

## 2020-08-10 MED ORDER — FENTANYL-BUPIVACAINE-NACL 0.5-0.125-0.9 MG/250ML-% EP SOLN: 12.0000 mL/h | EPIDURAL | Status: DC | PRN

## 2020-08-10 MED ORDER — LACTATED RINGERS IV SOLN
500.0000 mL | Freq: Once | INTRAVENOUS | Status: AC
  Administered 2020-08-10: 500 mL via INTRAVENOUS

## 2020-08-10 MED ORDER — OXYTOCIN-SODIUM CHLORIDE 30-0.9 UT/500ML-% IV SOLN
2.5000 [IU]/h | INTRAVENOUS | Status: DC
  Filled 2020-08-10: qty 500

## 2020-08-10 MED ORDER — OXYCODONE-ACETAMINOPHEN 5-325 MG PO TABS: 1.0000 | ORAL_TABLET | ORAL | Status: DC | PRN

## 2020-08-10 MED ORDER — OXYTOCIN BOLUS FROM INFUSION
333.0000 mL | Freq: Once | INTRAVENOUS | Status: AC
  Administered 2020-08-10: 333 mL via INTRAVENOUS

## 2020-08-10 MED ORDER — PHENYLEPHRINE 40 MCG/ML (10ML) SYRINGE FOR IV PUSH (FOR BLOOD PRESSURE SUPPORT): 80.0000 ug | PREFILLED_SYRINGE | INTRAVENOUS | Status: DC | PRN

## 2020-08-10 MED ORDER — EPHEDRINE 5 MG/ML INJ: 10.0000 mg | INTRAVENOUS | Status: DC | PRN

## 2020-08-10 MED ORDER — LIDOCAINE HCL (PF) 1 % IJ SOLN
INTRAMUSCULAR | Status: DC | PRN
  Administered 2020-08-10: 12 mL via EPIDURAL
  Administered 2020-08-10: 5 mL via EPIDURAL

## 2020-08-10 MED ORDER — ALPRAZOLAM 0.5 MG PO TABS
0.5000 mg | ORAL_TABLET | Freq: Three times a day (TID) | ORAL | Status: DC | PRN
  Administered 2020-08-11: 0.5 mg via ORAL
  Filled 2020-08-10: qty 1

## 2020-08-10 MED ORDER — LACTATED RINGERS IV SOLN: INTRAVENOUS | Status: DC

## 2020-08-10 MED ORDER — SOD CITRATE-CITRIC ACID 500-334 MG/5ML PO SOLN: 30.0000 mL | ORAL | Status: DC | PRN

## 2020-08-10 MED ORDER — LIDOCAINE HCL (PF) 1 % IJ SOLN: 30.0000 mL | INTRAMUSCULAR | Status: DC | PRN

## 2020-08-10 MED ORDER — FENTANYL CITRATE (PF) 100 MCG/2ML IJ SOLN: 50.0000 ug | INTRAMUSCULAR | Status: DC | PRN

## 2020-08-10 MED ORDER — ONDANSETRON HCL 4 MG/2ML IJ SOLN: 4.0000 mg | Freq: Four times a day (QID) | INTRAMUSCULAR | Status: DC | PRN

## 2020-08-10 MED ORDER — FENTANYL-BUPIVACAINE-NACL 0.5-0.125-0.9 MG/250ML-% EP SOLN
EPIDURAL | Status: AC
  Filled 2020-08-10: qty 250

## 2020-08-10 MED ORDER — NORTRIPTYLINE HCL 25 MG PO CAPS
25.0000 mg | ORAL_CAPSULE | Freq: Every day | ORAL | Status: DC
  Administered 2020-08-11 – 2020-08-12 (×2): 25 mg via ORAL
  Filled 2020-08-10 (×4): qty 1

## 2020-08-10 NOTE — Patient Instructions (Signed)

## 2020-08-10 NOTE — Discharge Instructions (Signed)

## 2020-08-10 NOTE — H&P (Signed)
Meagan [name] is a 30 y.o. female [phone] @ 37.2wks by LMP c/w 7wk scan presenting for reg ctx. Denies H/A, visual disturbances, or RUQ pain. Her preg has been followed by the [facility] service and has been remarkable for:  # anxiety (Xanax tid prn & Pamelor) # smoker # +THC use # hx pre-e # prev C/S with G4 for placental abruption with VBAC G5 # Rh neg # unwanted fertility (papers signed 06/06/20- not under media)  OB History    Gravida  6   Para  5   Term  4   Preterm  1   AB  0   Living  5     SAB  0   IAB  0   Ectopic  0   Multiple  0   Live Births  5        Obstetric Comments  G4=LTCS (not vertical) per op note       Past Medical History:  Diagnosis Date  . Abdominal cramps 12/06/2015  . Anemia   . Anxiety   . Asthma    inhaler at bedside  . Daily headache   . Diarrhea 06/04/2013  . Hemiplegic migraine    blurred vision, nausea, dizziness, numbness  . Kidney stones   . Mental disorder    huffed inhalants "affected brain"  . Pyelonephritis complicating pregnancy, antepartum, first trimester 11/05/2011  . Stomach ulcer    Past Surgical History:  Procedure Laterality Date  . ANKLE SURGERY Left 09/2016   plates & screws  . CESAREAN SECTION N/A 12/19/2017   Procedure: CESAREAN SECTION;  Surgeon: Meagan Arms, MD;  Location: [facility] BIRTHING SUITES;  Service: Obstetrics;  Laterality: N/A;   Family History: family history includes Cancer in her maternal grandmother and paternal grandfather; Hypercholesterolemia in her father; Hypertension in her father and another family member; Migraines in her father; Other in her son. Social History:  reports that she has been smoking cigarettes. She has a 7.00 pack-year smoking history. She has never used smokeless tobacco. She reports previous alcohol use. She reports previous drug use. Drugs: Other-see comments and Marijuana.     Maternal Diabetes: No Genetic Screening: Normal Maternal  Ultrasounds/Referrals: Normal Fetal Ultrasounds or other Referrals:  None Maternal Substance Abuse:  Yes:  Type: Smoker, Marijuana Significant Maternal Medications:  Meds include: Other: Xanax, Pamelor Significant Maternal Lab Results:  Rh negative Other Comments:  None  BPs: 152/100 with ctx, followed by 136/86, 134/69, 133/68, 110/62) Review of Systems History Dilation: 5.5 Effacement (%): 70 Station: -2 Exam by:: Dr A Blood pressure (!) 152/100, pulse (!) 126, temperature 97.6 F (36.4 C), temperature source Oral, resp. rate 18, last menstrual period 11/23/2019, SpO2 98 %, not currently breastfeeding. Exam Physical Exam HENT:     Head: Normocephalic.     Nose: Nose normal.     Mouth/Throat:     Mouth: Mucous membranes are moist.  Cardiovascular:     Rate and Rhythm: Tachycardia present.  Pulmonary:     Effort: Pulmonary effort is normal.  Abdominal:     Comments: EFM 130-140s, +accels, no decels (tracing mat at times) Ctx q 2 mins  Musculoskeletal:        General: Normal range of motion.     Cervical back: Normal range of motion.  Skin:    General: Skin is warm.  Neurological:     General: No focal deficit present.     Mental Status: She is alert.  Psychiatric:  Mood and Affect: Mood normal.        Thought Content: Thought content normal.     Prenatal labs: ABO, Rh: A/Negative/-- (08/11 1607) Antibody: Negative (01/06 1515) Rubella: 1.50 (08/11 1607) RPR: Non Reactive (01/06 1515)  HBsAg: Negative (08/11 1607)  HIV: Non Reactive (01/06 1515)  GBS: --Meagan [name] (02/04 1424)   CMP neg for pre-e; P/C ratio pending  Assessment/Plan: -IUP@37 .2wks -Early active labor/SROM -TOLAC (hx VD x 3, then C/S with G4 for placental abruption, VBAC with G5) -Rh neg -Hx pre-e -Smoker/marijuana use -Xanax & Pamelor during preg   Admit to Labor and Delivery Expectant management Anticipate successful VBAC Rhogam eval PP SW consult PP Desires ppBTL> papers not  seen under media; will check with FT office in the morning  Meagan [name] CNM 08/10/2020, 6:54 PM

## 2020-08-10 NOTE — Progress Notes (Signed)
Patient ID: Meagan [name], female   DOB: Nov 14, 1990, 30 y.o.   MRN: [id number]  Comfortable w epidural; feeling mild rectal pressure  BP 128/76 FHR 130s, +accels, no decels, occ variables Ctx q 2 mins Cx deferred (was 7-8/80/vtx -1)  UDS: +benzos, +THC Urine p/c ratio: 0.2  IUP@37 .2wks Active labor GBS neg  Anticipate over the next hour she will start to feel pressure Anticipate vag del  [name] CNM 08/10/2020

## 2020-08-10 NOTE — MAU Note (Signed)
Pt took the "midwife brew" and 12 today and 2 hrs later the cntrx started. Thinks she felt a 'pop" and then felt "water" between her legs. Arrived at MAU in labor. Blood seen when checking her cervix. She is contracting ever 1-2 mins. Rating pain 10/10. Feeling fetal movement this morning.

## 2020-08-10 NOTE — Progress Notes (Signed)
LOW-RISK PREGNANCY VISIT- work-in for leaking fluid Patient name: Meagan [name] MRN [id number]  Date of birth: 05-20-1991 Chief Complaint:   Routine Prenatal Visit (W/I for leaking)  History of Present Illness:   Meagan [name] is a 30 y.o. [phone] female at [redacted]w[redacted]d with an Estimated Date of Delivery: 08/29/20 being seen today for ongoing management of a low-risk pregnancy.  Today she reports having some thin, pink discharge in her panty liner this morning and also 2 mornings ago; hasn't seen fluid run down her legs and hasn't saturated through her clothes; having occ cramps. Contractions: Irregular.  .  Movement: Present. Questionable leaking of fluid. Review of Systems:   Pertinent items are noted in HPI Denies abnormal vaginal discharge w/ itching/odor/irritation, headaches, visual changes, shortness of breath, chest pain, abdominal pain, severe nausea/vomiting, or problems with urination or bowel movements unless otherwise stated above. Pertinent History Reviewed:  Reviewed past medical,surgical, social, obstetrical and family history.  Reviewed problem list, medications and allergies. Physical Assessment:   Vitals:   08/10/20 0944  BP: 133/88  Pulse: 91  Weight: 190 lb (86.2 kg)  Body mass index is 34.75 kg/m.        Physical Examination:   General appearance: Well appearing, and in no distress  Mental status: Alert, oriented to person, place, and time  Skin: Warm & dry  Cardiovascular: Normal heart rate noted  Respiratory: Normal respiratory effort, no distress  Abdomen: Soft, gravid, nontender  Pelvic: SSE: creamy pink d/c seen at cx; neg ferning; cx not examined while SROM is being ruled out         Extremities: Edema: Trace  Fetal Status: Fetal Heart Rate (bpm): 134   Movement: Present    Results for orders placed or performed in visit on 08/10/20 (from the past 24 hour(s))  POC Urinalysis Dipstick OB   Collection Time: 08/10/20  9:52 AM  Result Value Ref  Range   Color, UA     Clarity, UA     Glucose, UA Negative Negative   Bilirubin, UA     Ketones, UA neg    Spec Grav, UA     Blood, UA small    pH, UA     POC,PROTEIN,UA Negative Negative, Trace, Small (1+), Moderate (2+), Large (3+), 4+   Urobilinogen, UA     Nitrite, UA neg    Leukocytes, UA Negative Negative   Appearance     Odor    POCT hemoglobin   Collection Time: 08/10/20  9:53 AM  Result Value Ref Range   Hemoglobin 11.6 11 - 14.6 g/dL    Assessment & Plan:  1) Low-risk pregnancy [id number] at [redacted]w[redacted]d with an Estimated Date of Delivery: 08/29/20   2) Prev C/S, plans for TOLAC; needs to sign consent with MD at next visit  3) Anxiety, on Xanax & Pamelor  4) Hx pre-e, stable BP  5) Vaginal d/c, pink & creamy, neg fern, AFI 24cm (was 22.7cm on 08/05/20); no protein in urine   Meds: No orders of the defined types were placed in this encounter.  Labs/procedures today: SSE, limited OB u/s (AFI)  Plan:  Continue routine obstetrical care   Reviewed: Term labor symptoms and general obstetric precautions including but not limited to vaginal bleeding, contractions, leaking of fluid and fetal movement were reviewed in detail with the patient.  All questions were answered. Has home bp cuff. Check bp weekly, let us know if >140/90.   Follow-up: Return for As scheduled.  Orders  Placed This Encounter  Procedures  . POC Urinalysis Dipstick OB  . POCT hemoglobin   [name] [facility] 08/10/2020 10:53 AM

## 2020-08-10 NOTE — Anesthesia Procedure Notes (Signed)
Epidural Patient location during procedure: OB Start time: 08/10/2020 8:06 PM End time: 08/10/2020 8:17 PM  Staffing Anesthesiologist: Trevor Iha, MD Performed: anesthesiologist   Preanesthetic Checklist Completed: patient identified, IV checked, site marked, risks and benefits discussed, surgical consent, monitors and equipment checked, pre-op evaluation and timeout performed  Epidural Patient position: sitting Prep: DuraPrep and site prepped and draped Patient monitoring: continuous pulse ox and blood pressure Approach: midline Location: L3-L4 Injection technique: LOR air  Needle:  Needle type: Tuohy  Needle gauge: 17 G Needle length: 9 cm and 9 Needle insertion depth: 6 cm Catheter type: closed end flexible Catheter size: 19 Gauge Catheter at skin depth: 12 cm Test dose: negative  Assessment Events: blood not aspirated, injection not painful, no injection resistance, no paresthesia and negative IV test  Additional Notes Patient identified. Risks/Benefits/Options discussed with patient including but not limited to bleeding, infection, nerve damage, paralysis, failed block, incomplete pain control, headache, blood pressure changes, nausea, vomiting, reactions to medication both or allergic, itching and postpartum back pain. Confirmed with bedside nurse the patient's most recent platelet count. Confirmed with patient that they are not currently taking any anticoagulation, have any bleeding history or any family history of bleeding disorders. Patient expressed understanding and wished to proceed. All questions were answered. Sterile technique was used throughout the entire procedure. Please see nursing notes for vital signs. Test dose was given through epidural needle and negative prior to continuing to dose epidural or start infusion. Warning signs of high block given to the patient including shortness of breath, tingling/numbness in hands, complete motor block, or any concerning  symptoms with instructions to call for help. Patient was given instructions on fall risk and not to get out of bed. All questions and concerns addressed with instructions to call with any issues. 2 Attempt (S) . Patient tolerated procedure well.

## 2020-08-10 NOTE — Progress Notes (Signed)
[name] 37+2 wks,cephalic,AFI 24 cm,anterior placenta gr 3,fhr 135 bpm

## 2020-08-10 NOTE — MAU Note (Signed)
Provider aware of BP- occurred during contraction.

## 2020-08-10 NOTE — Anesthesia Preprocedure Evaluation (Signed)
Anesthesia Evaluation  Patient identified by MRN, date of birth, ID band Patient awake    Reviewed: Allergy & Precautions, NPO status , Patient's Chart, lab work & pertinent test results  Airway Mallampati: II  TM Distance: >3 FB Neck ROM: Full    Dental no notable dental hx. (+) Teeth Intact, Dental Advisory Given   Pulmonary asthma , Current Smoker,    Pulmonary exam normal breath sounds clear to auscultation       Cardiovascular Exercise Tolerance: Good negative cardio ROS Normal cardiovascular exam Rhythm:Regular Rate:Normal     Neuro/Psych  Headaches, Anxiety Hemiplegic Moigraines    GI/Hepatic Neg liver ROS, PUD,   Endo/Other  negative endocrine ROS  Renal/GU negative Renal ROS     Musculoskeletal   Abdominal   Peds  Hematology Lab Results      Component                Value               Date                      WBC                      10.0                08/10/2020                HGB                      12.3                08/10/2020                HCT                      37.2                08/10/2020                MCV                      94.7                08/10/2020                PLT                      185                 08/10/2020              Anesthesia Other Findings   Reproductive/Obstetrics (+) Pregnancy                             Anesthesia Physical Anesthesia Plan  ASA: II  Anesthesia Plan: Epidural   Post-op Pain Management:    Induction:   PONV Risk Score and Plan:   Airway Management Planned:   Additional Equipment:   Intra-op Plan:   Post-operative Plan:   Informed Consent:   Plan Discussed with:   Anesthesia Plan Comments: (37.5 wk G6P5 for TOLAC w Epidural )       Anesthesia Quick Evaluation

## 2020-08-10 NOTE — Discharge Summary (Signed)
Postpartum Discharge Summary       Patient Name: Meagan [name] DOB: [birth year] MRN: [id number]  Date of admission: 08/10/2020 Delivery date:08/10/2020  Delivering provider: Serita Grammes D  Date of discharge: 08/12/2020  Admitting diagnosis: Normal labor [O80, Z37.9] Intrauterine pregnancy: [redacted]w[redacted]d    Secondary diagnosis:  Active Problems:   Anxiety   Smoker   Hx of preeclampsia   Chronic prescription benzodiazepine use   Previous cesarean section   Rh negative state in antepartum period   History of vaginal delivery following previous cesarean delivery   Marijuana use   Unwanted fertility   Normal labor  Additional problems: none    Discharge diagnosis: Term Pregnancy Delivered and VBAC                                              Post partum procedures:none Augmentation: AROM Complications: Placental Abruption  Hospital course: Onset of Labor With Vaginal Delivery      30y.o. yo G914 220 7540at 38w2das admitted in Latent Labor on 08/10/2020 with ctx and mod bloody show. Patient had an uncomplicated labor course with AROM just prior to delivery. Mod bleeding persisted during labor, and placenta inspection revealed approx 25% area of adherent clot due to abruption.  Membrane Rupture Time/Date: 11:21 PM ,08/10/2020   Delivery Method:VBAC, Spontaneous  Episiotomy: None  Lacerations:  None  Patient had an uncomplicated postpartum course.  She is ambulating, tolerating a regular diet, passing flatus, and urinating well. Patient is discharged home in stable condition on 08/12/20.  Newborn Data: Birth date:08/10/2020  Birth time:11:26 PM  Gender:Female  Living status:Living  Apgars:8 ,9  Weight:2880 g (6lb 5.6oz)  Magnesium Sulfate received: No BMZ received: No Rhophylac:No (baby O neg) MMR:N/A T-DaP:declined prenatally Flu: No Transfusion:No  Physical exam  Vitals:   08/11/20 1032 08/11/20 1438 08/11/20 2057 08/12/20 0457  BP: 120/76 133/86 130/77 125/73  Pulse:  84 89 (!) 101 75  Resp: 18 18 20 18   Temp: 98.4 F (36.9 C) 98.2 F (36.8 C) 97.7 F (36.5 C) 98 F (36.7 C)  TempSrc: Oral Oral Oral Oral  SpO2: 100% 100% 98% 100%  Weight:      Height:       General: alert, cooperative and no distress Lochia: appropriate Uterine Fundus: firm Incision: N/A DVT Evaluation: No evidence of DVT seen on physical exam. Negative Homan's sign. No cords or calf tenderness. No significant calf/ankle edema. Labs: Lab Results  Component Value Date   WBC 10.0 08/10/2020   HGB 12.3 08/10/2020   HCT 37.2 08/10/2020   MCV 94.7 08/10/2020   PLT 185 08/10/2020   CMP Latest Ref Rng & Units 08/10/2020  Glucose 70 - 99 mg/dL 109(H)  BUN 6 - 20 mg/dL 5(L)  Creatinine 0.44 - 1.00 mg/dL 0.58  Sodium 135 - 145 mmol/L 132(L)  Potassium 3.5 - 5.1 mmol/L 3.7  Chloride 98 - 111 mmol/L 103  CO2 22 - 32 mmol/L 20(L)  Calcium 8.9 - 10.3 mg/dL 8.4(L)  Total Protein 6.5 - 8.1 g/dL 5.2(L)  Total Bilirubin 0.3 - 1.2 mg/dL 0.4  Alkaline Phos 38 - 126 U/L 80  AST 15 - 41 U/L 14(L)  ALT 0 - 44 U/L 14   Edinburgh Score: Edinburgh Postnatal Depression Scale Screening Tool 08/11/2020  I have been able to laugh and see the funny side  of things. (No Data)  I have looked forward with enjoyment to things. -  I have blamed myself unnecessarily when things went wrong. -  I have been anxious or worried for no good reason. -  I have felt scared or panicky for no good reason. -  Things have been getting on top of me. -  I have been so unhappy that I have had difficulty sleeping. -  I have felt sad or miserable. -  I have been so unhappy that I have been crying. -  The thought of harming myself has occurred to me. Flavia Shipper Postnatal Depression Scale Total -     After visit meds:  Allergies as of 08/12/2020      Reactions   Penicillins Rash, Other (See Comments)   Unknown:  Childhood reaction.  Has patient had a PCN reaction causing immediate rash, facial/tongue/throat  swelling, SOB or lightheadedness with hypotension: Unknown Has patient had a PCN reaction causing severe rash involving mucus membranes or skin necrosis: Unknown Has patient had a PCN reaction that required hospitalization No Has patient had a PCN reaction occurring within the last 10 years: No If all of the above answers are "NO", then may pro   Sulfamethoxazole-trimethoprim Hives      Medication List    STOP taking these medications   albuterol 108 (90 Base) MCG/ACT inhaler Commonly known as: VENTOLIN HFA   Blood Pressure Monitor Misc   butalbital-acetaminophen-caffeine 50-325-40 MG tablet Commonly known as: FIORICET   cyclobenzaprine 10 MG tablet Commonly known as: FLEXERIL   ferrous sulfate 325 (65 FE) MG tablet     TAKE these medications   ALPRAZolam 0.5 MG tablet Commonly known as: XANAX 3 times daily as needed for anxiety and/or sleep   ibuprofen 600 MG tablet Commonly known as: ADVIL Take 1 tablet (600 mg total) by mouth every 6 (six) hours.   medroxyPROGESTERone 150 MG/ML injection Commonly known as: DEPO-PROVERA Inject 1 mL (150 mg total) into the muscle every 3 (three) months.   nortriptyline 25 MG capsule Commonly known as: PAMELOR Take 1 capsule (25 mg total) by mouth at bedtime.   PrePLUS 27-1 MG Tabs TAKE ONE TABLET BY MOUTH AT 12 NOON. What changed: See the new instructions.        Discharge home in stable condition Infant Feeding: Breast Infant Disposition:home with mother Discharge instruction: per After Visit Summary and Postpartum booklet. Activity: Advance as tolerated. Pelvic rest for 6 weeks.  Diet: routine diet Future Appointments: Future Appointments  Date Time Provider [place]  08/29/2020  2:50 PM Florian Buff, MD [facility]   Follow up Visit:  Follow-up Information    [facility] Follow up on 08/29/2020.   Specialty: Obstetrics and Gynecology Why: for tubal pre op Contact information: [address] [place] [phone]              Myrtis Ser, CNM  Gloris [place] Please schedule this patient for Postpartum visit in: 1-2wks with the following provider: Dr Elonda Husky (for pre-op interval BTL)  In-Person  For C/S patients schedule nurse incision check in weeks 2 weeks: no  High risk pregnancy complicated by: VBAC  Delivery mode: VBAC  Anticipated Birth Control: Plans Interval BTL- needs to sign 30d papers  PP Procedures needed: none  Schedule Integrated BH visit: no    08/12/2020 Christin Fudge, CNM

## 2020-08-11 ENCOUNTER — Encounter (HOSPITAL_COMMUNITY): Payer: Self-pay | Admitting: Obstetrics & Gynecology

## 2020-08-11 LAB — RPR: RPR Ser Ql: NONREACTIVE

## 2020-08-11 MED ORDER — PRENATAL MULTIVITAMIN CH
1.0000 | ORAL_TABLET | Freq: Every day | ORAL | Status: DC
  Administered 2020-08-11 – 2020-08-12 (×2): 1 via ORAL
  Filled 2020-08-11 (×2): qty 1

## 2020-08-11 MED ORDER — SENNOSIDES-DOCUSATE SODIUM 8.6-50 MG PO TABS
2.0000 | ORAL_TABLET | ORAL | Status: DC
  Administered 2020-08-11 – 2020-08-12 (×2): 2 via ORAL
  Filled 2020-08-11 (×3): qty 2

## 2020-08-11 MED ORDER — TETANUS-DIPHTH-ACELL PERTUSSIS 5-2.5-18.5 LF-MCG/0.5 IM SUSY: 0.5000 mL | PREFILLED_SYRINGE | Freq: Once | INTRAMUSCULAR | Status: DC

## 2020-08-11 MED ORDER — COCONUT OIL OIL
1.0000 "application " | TOPICAL_OIL | Status: DC | PRN
  Administered 2020-08-11: 1 via TOPICAL

## 2020-08-11 MED ORDER — MEASLES, MUMPS & RUBELLA VAC IJ SOLR
0.5000 mL | Freq: Once | INTRAMUSCULAR | Status: DC
Start: 2020-08-11 — End: 2020-08-12

## 2020-08-11 MED ORDER — IBUPROFEN 600 MG PO TABS
600.0000 mg | ORAL_TABLET | Freq: Four times a day (QID) | ORAL | Status: DC
  Administered 2020-08-11 – 2020-08-12 (×6): 600 mg via ORAL
  Filled 2020-08-11 (×6): qty 1

## 2020-08-11 MED ORDER — ONDANSETRON HCL 4 MG/2ML IJ SOLN: 4.0000 mg | INTRAMUSCULAR | Status: DC | PRN

## 2020-08-11 MED ORDER — SIMETHICONE 80 MG PO CHEW: 80.0000 mg | CHEWABLE_TABLET | ORAL | Status: DC | PRN

## 2020-08-11 MED ORDER — DIPHENHYDRAMINE HCL 25 MG PO CAPS: 25.0000 mg | ORAL_CAPSULE | Freq: Four times a day (QID) | ORAL | Status: DC | PRN

## 2020-08-11 MED ORDER — WITCH HAZEL-GLYCERIN EX PADS: 1.0000 "application " | MEDICATED_PAD | CUTANEOUS | Status: DC | PRN

## 2020-08-11 MED ORDER — BENZOCAINE-MENTHOL 20-0.5 % EX AERO: 1.0000 "application " | INHALATION_SPRAY | CUTANEOUS | Status: DC | PRN

## 2020-08-11 MED ORDER — ZOLPIDEM TARTRATE 5 MG PO TABS: 5.0000 mg | ORAL_TABLET | Freq: Every evening | ORAL | Status: DC | PRN

## 2020-08-11 MED ORDER — ACETAMINOPHEN 325 MG PO TABS: 650.0000 mg | ORAL_TABLET | ORAL | Status: DC | PRN

## 2020-08-11 MED ORDER — OXYCODONE HCL 5 MG PO TABS: 5.0000 mg | ORAL_TABLET | ORAL | Status: DC | PRN

## 2020-08-11 MED ORDER — DIBUCAINE (PERIANAL) 1 % EX OINT: 1.0000 "application " | TOPICAL_OINTMENT | CUTANEOUS | Status: DC | PRN

## 2020-08-11 MED ORDER — ONDANSETRON HCL 4 MG PO TABS: 4.0000 mg | ORAL_TABLET | ORAL | Status: DC | PRN

## 2020-08-11 NOTE — Progress Notes (Addendum)
Post Partum Day #1 Subjective: no complaints, up ad lib and tolerating PO; baby has latched; is also bottlefeeding a little too; planning for interval BTL (msg sent for 1-2wk appt at [facility] to sign 30d papers)  Objective: Blood pressure (!) 100/57, pulse 79, temperature 98.4 F (36.9 C), temperature source Oral, resp. rate 16, height 5\' 2"  (1.575 m), weight 86.2 kg, last menstrual period 11/23/2019, SpO2 98 %, unknown if currently breastfeeding.  Physical Exam:  General: alert, cooperative and no distress Lochia: appropriate Uterine Fundus: firm DVT Evaluation: No evidence of DVT seen on physical exam.  Recent Labs    08/10/20 0953 08/10/20 1909  HGB 11.6 12.3  HCT  --  37.2   Baby: O neg  Assessment/Plan: Plan for discharge tomorrow and Social Work consult  Baby may need NAS (Xanax); she will discuss further with peds No need for Rhogam   LOS: 1 day   10/08/20 CNM 08/11/2020, 8:37 AM

## 2020-08-11 NOTE — Lactation Note (Signed)
This note was copied from a baby's chart. Lactation Consultation Note  Patient Name: Meagan [name] [id number] Date: 08/11/2020    [facility] went to see patient but they were outside and infant in the nursery. Mom is on Xanax 0.5 mg TID which is an L3 medication in the [place] book. LC contacted RN, Demetrius Revel about the [place] medication status for Xanax and provider informed since she was in the nursery at the time of the call via vocera.

## 2020-08-11 NOTE — Lactation Note (Addendum)
This note was copied from a baby's chart. Lactation Consultation Note  Patient Name: Meagan [name] PJASN'K Date: 08/11/2020 Reason for consult: Initial assessment;Mother's request;Difficult latch;1st time breastfeeding;Early term 37-38.6wks;Nipple pain/trauma (Anxiety, Anemia) Age:28 hours   Mom having trouble latching infant on the left side. LC tried to latch but he tends to arch his back on push off with his feet. We tried a semi prone and football with same results. LC used 24 NS primed with 0.5 ml of Gerber good start but he only latched longing enough to empty the nipple shield.  Mom complains of tenderness at the nipple. We went over how to get a deeper latch and the pain resolved. RN to provide coconut oil for nipple care. No signs of abrasions nipples are red.   LC returned and Mom stated she had some blood with use of the pump. I examined the nipples but there was no signs of abrasion. I told Mom she can increase flange size to 27 with the next pumping session.   Infant abel to latch on the left side for 20 minutes without NS and signs of milk transfer noted.   Plan 1. To feed based on cues 8-12 x in 24 hours no more than 3 hours without an attempt. Place infant STS and look for signs of milk transfer.           2. Breastfeeding supplementation guide reviewed. Dad to offer paced bottle feeding of Gerber good start with purple extra slow flow nipple.            3 Mom to pump using DEBP q 3 hrs for 15 minutes.             4. All questions answered at the end of the visit.  LC reviewed findings with RN Candace Cruise as described above.   Maternal Data Has patient been taught Hand Expression?: Yes Does the patient have breastfeeding experience prior to this delivery?: Yes How long did the patient breastfeed?: She was not successful due to lack of support  Feeding Mother's Current Feeding Choice: Breast Milk and Formula  LATCH Score Latch: Repeated attempts needed to  sustain latch, nipple held in mouth throughout feeding, stimulation needed to elicit sucking reflex.  Audible Swallowing: Spontaneous and intermittent  Type of Nipple: Everted at rest and after stimulation  Comfort (Breast/Nipple): Filling, red/small blisters or bruises, mild/mod discomfort  Hold (Positioning): Assistance needed to correctly position infant at breast and maintain latch.  LATCH Score: 7   Lactation Tools Discussed/Used Tools: Shells;Pump;Flanges;Coconut oil;Nipple Shields Nipple shield size: 24 (Infant will not latch on the left. Primed NS 24 with 0.5 ml but only latch until shield was empty.) Flange Size: 24 Breast pump type: Double-Electric Breast Pump  Interventions Interventions: Breast feeding basics reviewed;Support pillows;Education;Assisted with latch;Position options;Skin to skin;Expressed milk;Breast massage;Hand express;Coconut oil;DEBP;Breast compression;Adjust position  Discharge [facility] Program: No  Consult Status Consult Status: Follow-up Date: 08/12/20 Follow-up type: In-patient    Aidyn Sportsman  [place] 08/11/2020, 5:00 PM

## 2020-08-11 NOTE — Lactation Note (Signed)
This note was copied from a baby's chart. Lactation Consultation Note  Patient Name: Boy Kamariah Fruchter OBTVM'T Date: 08/11/2020   Age:28 hours LC entered L&D and mom declined LC services at this time.  (No LC charge).  Maternal Data    Feeding    LATCH Score                    Lactation Tools Discussed/Used    Interventions    Discharge    Consult Status      Danelle Earthly 08/11/2020, 12:00 AM
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
## 2020-08-11 NOTE — Anesthesia Postprocedure Evaluation (Signed)
Anesthesia Post Note  Patient: Meagan [name]  Procedure(s) Performed: AN AD HOC LABOR EPIDURAL     Patient location during evaluation: Mother Baby Anesthesia Type: Epidural Level of consciousness: awake and alert Pain management: pain level controlled Vital Signs Assessment: post-procedure vital signs reviewed and stable Respiratory status: spontaneous breathing, nonlabored ventilation and respiratory function stable Cardiovascular status: stable Postop Assessment: no headache, no backache and epidural receding Anesthetic complications: no   No complications documented.  Last Vitals:  Vitals:   08/11/20 0540 08/11/20 1032  BP: (!) 100/57 120/76  Pulse: 79 84  Resp: 16 18  Temp: 36.9 C 36.9 C  SpO2: 98% 100%    Last Pain:  Vitals:   08/11/20 1033  TempSrc:   PainSc: 3    Pain Goal:                Epidural/Spinal Function Cutaneous sensation: Normal sensation (08/11/20 1033)  [place]

## 2020-08-12 ENCOUNTER — Encounter: Payer: Medicaid Other | Admitting: Obstetrics & Gynecology

## 2020-08-12 MED ORDER — IBUPROFEN 600 MG PO TABS: 600.0000 mg | ORAL_TABLET | Freq: Four times a day (QID) | ORAL | 0 refills | Status: DC

## 2020-08-12 MED ORDER — MEDROXYPROGESTERONE ACETATE 150 MG/ML IM SUSP: 150.0000 mg | INTRAMUSCULAR | 3 refills | Status: DC

## 2020-08-12 NOTE — Lactation Note (Signed)
This note was copied from a baby's chart. Lactation Consultation Note  Patient Name: Meagan [name] Date: 08/12/2020 Reason for consult: Follow-up assessment Age:28 hours  Follow up to 40 hours old with 3.65% weight loss. Infant is sleeping in basinet upon arrival. Mother states breastfeeding is going well but she feels she does not have much milk at this point. Mother explains she is pumping and unable to collect any EBM. Last feeding baby breastfed for 60 minutes ( each breast) and bottlefed 25 mL of formula.    Mother reports feeding infant in side lying position and pacing. Praised mother for efforts. Reinforced the importance of effective feeding and keeping infant awake at breast. Discussed preserving infant's energy since he is early term by avoiding lengthy feeding sessions. Mother verbalizes in agreement.   All questions answered at this time. Encouraged parents to contact LC for any needs,questions or concerns   Feeding Mother's Current Feeding Choice: Breast Milk and Formula Nipple Type: Extra Slow Flow  Interventions Interventions: Breast feeding basics reviewed;Hand express;Expressed milk;DEBP;Education  Consult Status Consult Status: Follow-up Date: 08/13/20 Follow-up type: In-patient    Emmilia Sowder A Higuera [place] 08/12/2020, 4:07 PM

## 2020-08-13 ENCOUNTER — Telehealth: Payer: Medicaid Other | Admitting: Family Medicine

## 2020-08-13 DIAGNOSIS — M62830 Muscle spasm of back: Secondary | ICD-10-CM

## 2020-08-13 DIAGNOSIS — M545 Low back pain, unspecified: Secondary | ICD-10-CM | POA: Diagnosis not present

## 2020-08-13 MED ORDER — CYCLOBENZAPRINE HCL 5 MG PO TABS
5.0000 mg | ORAL_TABLET | Freq: Every day | ORAL | 0 refills | Status: DC
Start: 2020-08-13 — End: 2022-08-07

## 2020-08-13 NOTE — Patient Instructions (Addendum)
Cyclobenzaprine 5 mg at bedtime for back spasms. Continue Ibuprofen 800 mg every 8 hours as needed for mild to moderate back pain. Use interchangeably with Tylenol 1000 mg every 6 hours as needed.  Apply heating pad to low back as needed.  Start back exercises when cleared by obstetrician  I enjoyed seeing the new baby, congratulations again.   Thanks for allowing [name] to be apart of your care.  Cyclobenzaprine tablets What is this medicine? CYCLOBENZAPRINE (sye kloe BEN za preen) is a muscle relaxer. It is used to treat muscle pain, spasms, and stiffness. This medicine may be used for other purposes; ask your health care provider or pharmacist if you have questions. COMMON BRAND NAME(S): Fexmid, Flexeril What should I tell my health care provider before I take this medicine? They need to know if you have any of these conditions:  heart disease, irregular heartbeat, or previous heart attack  liver disease  thyroid problem  an unusual or allergic reaction to cyclobenzaprine, tricyclic antidepressants, lactose, other medicines, foods, dyes, or preservatives  pregnant or trying to get pregnant  breast-feeding How should I use this medicine? Take this medicine by mouth with a glass of water. Follow the directions on the prescription label. If this medicine upsets your stomach, take it with food or milk. Take your medicine at regular intervals. Do not take it more often than directed. Talk to your pediatrician regarding the use of this medicine in children. Special care may be needed. Overdosage: If you think you have taken too much of this medicine contact a poison control center or emergency room at once. NOTE: This medicine is only for you. Do not share this medicine with others. What if I miss a dose? If you miss a dose, take it as soon as you can. If it is almost time for your next dose, take only that dose. Do not take double or extra doses. What may interact with this  medicine? Do not take this medicine with any of the following medications:  MAOIs like Carbex, Eldepryl, Marplan, Nardil, and Parnate  narcotic medicines for cough  safinamide This medicine may also interact with the following medications:  alcohol  bupropion  antihistamines for allergy, cough and cold  certain medicines for anxiety or sleep  certain medicines for bladder problems like oxybutynin, tolterodine  certain medicines for depression like amitriptyline, fluoxetine, sertraline  certain medicines for Parkinson's disease like benztropine, trihexyphenidyl  certain medicines for seizures like phenobarbital, primidone  certain medicines for stomach problems like dicyclomine, hyoscyamine  certain medicines for travel sickness like scopolamine  general anesthetics like halothane, isoflurane, methoxyflurane, propofol  ipratropium  local anesthetics like lidocaine, pramoxine, tetracaine  medicines that relax muscles for surgery  narcotic medicines for pain  phenothiazines like chlorpromazine, mesoridazine, prochlorperazine, thioridazine  verapamil This list may not describe all possible interactions. Give your health care provider a list of all the medicines, herbs, non-prescription drugs, or dietary supplements you use. Also tell them if you smoke, drink alcohol, or use illegal drugs. Some items may interact with your medicine. What should I watch for while using this medicine? Tell your doctor or health care professional if your symptoms do not start to get better or if they get worse. You may get drowsy or dizzy. Do not drive, use machinery, or do anything that needs mental alertness until you know how this medicine affects you. Do not stand or sit up quickly, especially if you are an older patient. This reduces the risk  of dizzy or fainting spells. Alcohol may interfere with the effect of this medicine. Avoid alcoholic drinks. If you are taking another medicine that  also causes drowsiness, you may have more side effects. Give your health care provider a list of all medicines you use. Your doctor will tell you how much medicine to take. Do not take more medicine than directed. Call emergency for help if you have problems breathing or unusual sleepiness. Your mouth may get dry. Chewing sugarless gum or sucking hard candy, and drinking plenty of water may help. Contact your doctor if the problem does not go away or is severe. What side effects may I notice from receiving this medicine? Side effects that you should report to your doctor or health care professional as soon as possible:  allergic reactions like skin rash, itching or hives, swelling of the face, lips, or tongue  breathing problems  chest pain  fast, irregular heartbeat  hallucinations  seizures  unusually weak or tired Side effects that usually do not require medical attention (report to your doctor or health care professional if they continue or are bothersome):  headache  nausea, vomiting This list may not describe all possible side effects. Call your doctor for medical advice about side effects. You may report side effects to FDA at 1-800-FDA-1088. Where should I keep my medicine? Keep out of the reach of children. Store at room temperature between 15 and 30 degrees C (59 and 86 degrees F). Keep container tightly closed. Throw away any unused medicine after the expiration date. NOTE: This sheet is a summary. It may not cover all possible information. If you have questions about this medicine, talk to your doctor, pharmacist, or health care provider.  2021 Elsevier/Gold Standard (2018-05-21 12:49:26)  Back Exercises These exercises help to make your trunk and back strong. They also help to keep the lower back flexible. Doing these exercises can help to prevent back pain or lessen existing pain.  If you have back pain, try to do these exercises 2-3 times each day or as told by your  doctor.  As you get better, do the exercises once each day. Repeat the exercises more often as told by your doctor.  To stop back pain from coming back, do the exercises once each day, or as told by your doctor. Exercises Single knee to chest Do these steps 3-5 times in a row for each leg: 1. Lie on your back on a firm bed or the floor with your legs stretched out. 2. Bring one knee to your chest. 3. Grab your knee or thigh with both hands and hold them it in place. 4. Pull on your knee until you feel a gentle stretch in your lower back or buttocks. 5. Keep doing the stretch for 10-30 seconds. 6. Slowly let go of your leg and straighten it. Pelvic tilt Do these steps 5-10 times in a row: 1. Lie on your back on a firm bed or the floor with your legs stretched out. 2. Bend your knees so they point up to the ceiling. Your feet should be flat on the floor. 3. Tighten your lower belly (abdomen) muscles to press your lower back against the floor. This will make your tailbone point up to the ceiling instead of pointing down to your feet or the floor. 4. Stay in this position for 5-10 seconds while you gently tighten your muscles and breathe evenly. Cat-cow Do these steps until your lower back bends more easily: 1. Get on  your hands and knees on a firm surface. Keep your hands under your shoulders, and keep your knees under your hips. You may put padding under your knees. 2. Let your head hang down toward your chest. Tighten (contract) the muscles in your belly. Point your tailbone toward the floor so your lower back becomes rounded like the back of a cat. 3. Stay in this position for 5 seconds. 4. Slowly lift your head. Let the muscles of your belly relax. Point your tailbone up toward the ceiling so your back forms a sagging arch like the back of a cow. 5. Stay in this position for 5 seconds.   Press-ups Do these steps 5-10 times in a row: 1. Lie on your belly (face-down) on the  floor. 2. Place your hands near your head, about shoulder-width apart. 3. While you keep your back relaxed and keep your hips on the floor, slowly straighten your arms to raise the top half of your body and lift your shoulders. Do not use your back muscles. You may change where you place your hands in order to make yourself more comfortable. 4. Stay in this position for 5 seconds. 5. Slowly return to lying flat on the floor.   Bridges Do these steps 10 times in a row: 1. Lie on your back on a firm surface. 2. Bend your knees so they point up to the ceiling. Your feet should be flat on the floor. Your arms should be flat at your sides, next to your body. 3. Tighten your butt muscles and lift your butt off the floor until your waist is almost as high as your knees. If you do not feel the muscles working in your butt and the back of your thighs, slide your feet 1-2 inches farther away from your butt. 4. Stay in this position for 3-5 seconds. 5. Slowly lower your butt to the floor, and let your butt muscles relax. If this exercise is too easy, try doing it with your arms crossed over your chest.   Belly crunches Do these steps 5-10 times in a row: 1. Lie on your back on a firm bed or the floor with your legs stretched out. 2. Bend your knees so they point up to the ceiling. Your feet should be flat on the floor. 3. Cross your arms over your chest. 4. Tip your chin a little bit toward your chest but do not bend your neck. 5. Tighten your belly muscles and slowly raise your chest just enough to lift your shoulder blades a tiny bit off of the floor. Avoid raising your body higher than that, because it can put too much stress on your low back. 6. Slowly lower your chest and your head to the floor. Back lifts Do these steps 5-10 times in a row: 1. Lie on your belly (face-down) with your arms at your sides, and rest your forehead on the floor. 2. Tighten the muscles in your legs and your  butt. 3. Slowly lift your chest off of the floor while you keep your hips on the floor. Keep the back of your head in line with the curve in your back. Look at the floor while you do this. 4. Stay in this position for 3-5 seconds. 5. Slowly lower your chest and your face to the floor. Contact a doctor if:  Your back pain gets a lot worse when you do an exercise.  Your back pain does not get better 2 hours after you exercise. If  you have any of these problems, stop doing the exercises. Do not do them again unless your doctor says it is okay. Get help right away if:  You have sudden, very bad back pain. If this happens, stop doing the exercises. Do not do them again unless your doctor says it is okay. This information is not intended to replace advice given to you by your health care provider. Make sure you discuss any questions you have with your health care provider. Document Revised: 03/13/2018 Document Reviewed: 03/13/2018 Elsevier Patient Education  2021 [employer]

## 2020-08-13 NOTE — Progress Notes (Signed)
[name], Meagan [name] are scheduled for a virtual visit with your provider today.    Just as we do with appointments in the office, we must obtain your consent to participate.  Your consent will be active for this visit and any virtual visit you may have with one of our providers in the next 365 days.    If you have a MyChart account, I can also send a copy of this consent to you electronically.  All virtual visits are billed to your insurance company just like a traditional visit in the office.  As this is a virtual visit, video technology does not allow for your provider to perform a traditional examination.  This may limit your provider's ability to fully assess your condition.  If your provider identifies any concerns that need to be evaluated in person or the need to arrange testing such as labs, EKG, etc, we will make arrangements to do so.    Although advances in technology are sophisticated, we cannot ensure that it will always work on either your end or our end.  If the connection with a video visit is poor, we may have to switch to a telephone visit.  With either a video or telephone visit, we are not always able to ensure that we have a secure connection.   I need to obtain your verbal consent now.   Are you willing to proceed with your visit today?   Meagan [name] has provided verbal consent on 08/13/2020 for a virtual visit (video or telephone).  Virtual Visit via Video Note  I connected with Meagan [name] on 08/13/20 at  1:00 PM EST by a video enabled telemedicine application and verified that I am speaking with the correct person using two identifiers.  Location: Patient: Home Provider: Home office   I discussed the limitations of evaluation and management by telemedicine and the availability of in person appointments. The patient expressed understanding and agreed to proceed.  History of Present Illness: Meagan [name] is a 30 year old female with a medical history  significant for migraine headaches and anxiety that presents via video with complaints of low back spasms and low back pain that has been worsening over the past 24 hours.  Patient is postpartum.  She just delivered a baby boy on 08/10/2020.  Patient has been taking ibuprofen 800 and Tylenol for increased low back pain without sustained relief.  Patient characterizes pain as intermittent, crampy, and occasionally throbbing.  Back Pain This is a new problem. The current episode started in the past 7 days. The problem occurs intermittently. The problem is unchanged. The pain is present in the lumbar spine. The quality of the pain is described as cramping. The pain does not radiate. The pain is at a severity of 7/10. The pain is moderate. The symptoms are aggravated by position. Associated symptoms include abdominal pain. Pertinent negatives include no bladder incontinence, bowel incontinence, numbness, paresis, paresthesias or pelvic pain.   Review of Systems  HENT: Negative.   Eyes: Negative.   Respiratory: Negative.   Cardiovascular: Negative.   Gastrointestinal: Positive for abdominal pain. Negative for bowel incontinence.  Genitourinary: Negative.  Negative for bladder incontinence and pelvic pain.  Musculoskeletal: Positive for back pain.  Skin: Negative.   Neurological: Negative.  Negative for numbness and paresthesias.  Psychiatric/Behavioral: Negative.    Past Medical History:  Diagnosis Date  . Abdominal cramps 12/06/2015  . Anemia   . Anxiety   . Asthma    inhaler at bedside  .  Daily headache   . Diarrhea 06/04/2013  . Hemiplegic migraine    blurred vision, nausea, dizziness, numbness  . Kidney stones   . Mental disorder    huffed inhalants "affected brain"  . Pyelonephritis complicating pregnancy, antepartum, first trimester 11/05/2011  . Stomach ulcer    Social History   Socioeconomic History  . Marital status: Legally Separated    Spouse name: Meagan [name]  . Number of  children: 3  . Years of education: Not on file  . Highest education level: GED or equivalent  Occupational History  . Not on file  Tobacco Use  . Smoking status: Current Every Day Smoker    Packs/day: 1.00    Years: 7.00    Pack years: 7.00    Types: Cigarettes  . Smokeless tobacco: Never Used  Vaping Use  . Vaping Use: Former  Substance and Sexual Activity  . Alcohol use: Not Currently    Alcohol/week: 0.0 standard drinks    Comment: occ; not now  . Drug use: Not Currently    Types: Other-see comments, Marijuana    Comment: huffed inhalants in past, "affected Brain"; as of 08/01/20 "oncce in the last 30 days"  . Sexual activity: Yes    Birth control/protection: Condom  Other Topics Concern  . Not on file  Social History Narrative   Lives at home with her children   Right handed.   Six sodas per day.   Currently about [redacted] weeks pregnant as of 12/15/2018   Social Determinants of Health   Financial Resource Strain: Low Risk   . Difficulty of Paying Living Expenses: Not very hard  Food Insecurity: No Food Insecurity  . Worried About Programme researcher, broadcasting/film/video in the Last Year: Never true  . Ran Out of Food in the Last Year: Never true  Transportation Needs: No Transportation Needs  . Lack of Transportation (Medical): No  . Lack of Transportation (Non-Medical): No  Physical Activity: Sufficiently Active  . Days of Exercise per Week: 5 days  . Minutes of Exercise per Session: 30 min  Stress: Stress Concern Present  . Feeling of Stress : Very much  Social Connections: Moderately Isolated  . Frequency of Communication with Friends and Family: More than three times a week  . Frequency of Social Gatherings with Friends and Family: Never  . Attends Religious Services: 1 to 4 times per year  . Active Member of Clubs or Organizations: No  . Attends Banker Meetings: Never  . Marital Status: Divorced  Catering manager Violence: Not At Risk  . Fear of Current or Ex-Partner:  No  . Emotionally Abused: No  . Physically Abused: No  . Sexually Abused: No   Immunization History  Administered Date(s) Administered  . Rho (D) Immune Globulin 06/01/2012, 07/14/2020  . Tdap 11/18/2017   Allergies  Allergen Reactions  . Penicillins Rash and Other (See Comments)    Unknown:  Childhood reaction.  Has patient had a PCN reaction causing immediate rash, facial/tongue/throat swelling, SOB or lightheadedness with hypotension: Unknown Has patient had a PCN reaction causing severe rash involving mucus membranes or skin necrosis: Unknown Has patient had a PCN reaction that required hospitalization No Has patient had a PCN reaction occurring within the last 10 years: No If all of the above answers are "NO", then may pro  . Sulfamethoxazole-Trimethoprim Hives      Assessment and Plan:  1. Spasm of muscle of lower back Recommend flexaril at bedtime for low back spasms  -  cyclobenzaprine (FLEXERIL) 5 MG tablet; Take 1 tablet (5 mg total) by mouth at bedtime.  Dispense: 30 tablet; Refill: 0  2. Low back pain at multiple sites Continue Ibuprofen 800 mg every 8 hours with food Use Ibuprofen interchangeably with Tylenol 1000 mg every 6 hours as needed for mild to moderate pain - cyclobenzaprine (FLEXERIL) 5 MG tablet; Take 1 tablet (5 mg total) by mouth at bedtime.  Dispense: 30 tablet; Refill: 0 Follow Up Instructions:    I discussed the assessment and treatment plan with the patient. The patient was provided an opportunity to ask questions and all were answered. The patient agreed with the plan and demonstrated an understanding of the instructions.   The patient was advised to call back or seek an in-person evaluation if the symptoms worsen or if the condition fails to improve as anticipated.  I provided 10 minutes of non-face-to-face time during this encounter.   Nolon Nations  APRN, MSN, FNP-C Patient Care [facility] [address]  [place], [place] [address] [phone]   08/13/2020  1:03 PM

## 2020-08-24 ENCOUNTER — Telehealth: Payer: Self-pay | Admitting: Obstetrics & Gynecology

## 2020-08-24 NOTE — Telephone Encounter (Signed)
Pt called requesting birth control, would like to start something immediately  States Drenda Freeze advised if she's strictly breast feeding (which pt states she is strictly breast feeding) the chances of getting pregnant are very low, pt does not want to take any chances & would like to start something today.  States she has 6 kids & she knows her body & doesn't want to get pregnant   Asked about pills to start today, tried to explain they take time to get in system & work as they should   Please advise & notify pt   [place]

## 2020-08-25 ENCOUNTER — Other Ambulatory Visit: Payer: Self-pay | Admitting: Advanced Practice Midwife

## 2020-08-25 ENCOUNTER — Encounter: Payer: Self-pay | Admitting: *Deleted

## 2020-08-25 ENCOUNTER — Telehealth: Payer: Self-pay | Admitting: *Deleted

## 2020-08-25 MED ORDER — NORETHINDRONE 0.35 MG PO TABS: 1.0000 | ORAL_TABLET | Freq: Every day | ORAL | 11 refills | Status: DC

## 2020-08-25 NOTE — Telephone Encounter (Signed)
Micronoir sent in. Start now, will be working in about 2 weeks.  Take same time everyday!

## 2020-08-25 NOTE — Telephone Encounter (Signed)
Patient informed Micronor sent in.  Can start now but should use condoms for weeks and to take at the same time everyday.  Advised if she stopped breastfeeding to let us know as we would need to switch to a different type of pill.  Verbalized understanding.

## 2020-08-29 ENCOUNTER — Other Ambulatory Visit: Payer: Self-pay

## 2020-08-29 ENCOUNTER — Encounter: Payer: Self-pay | Admitting: Obstetrics & Gynecology

## 2020-08-29 ENCOUNTER — Other Ambulatory Visit: Payer: Self-pay | Admitting: Women's Health

## 2020-08-29 ENCOUNTER — Ambulatory Visit (INDEPENDENT_AMBULATORY_CARE_PROVIDER_SITE_OTHER): Payer: Medicaid Other | Admitting: Obstetrics & Gynecology

## 2020-08-29 VITALS — BP 130/82 | HR 84 | Ht 63.0 in | Wt 163.0 lb

## 2020-08-29 DIAGNOSIS — Z3009 Encounter for other general counseling and advice on contraception: Secondary | ICD-10-CM | POA: Diagnosis not present

## 2020-08-29 MED ORDER — BUTALBITAL-APAP-CAFFEINE 50-325-40 MG PO TABS: 1.0000 | ORAL_TABLET | ORAL | 0 refills | Status: DC | PRN

## 2020-08-29 MED ORDER — AZITHROMYCIN 250 MG PO TABS: ORAL_TABLET | ORAL | 0 refills | Status: DC

## 2020-08-29 NOTE — Progress Notes (Signed)
Preoperative History and Physical  Meagan [name] is a 30 y.o. [phone] with Patient's last menstrual period was 11/23/2019. admitted for a laparoscopic bilateral salpingectomy for sterilization.  Pt opts for salpingectomy for ovarian cancer prophylaxis  PMH:    Past Medical History:  Diagnosis Date  . Abdominal cramps 12/06/2015  . Anemia   . Anxiety   . Asthma    inhaler at bedside  . Daily headache   . Diarrhea 06/04/2013  . Hemiplegic migraine    blurred vision, nausea, dizziness, numbness  . Kidney stones   . Mental disorder    huffed inhalants "affected brain"  . Pyelonephritis complicating pregnancy, antepartum, first trimester 11/05/2011  . Stomach ulcer     PSH:     Past Surgical History:  Procedure Laterality Date  . ANKLE SURGERY Left 09/2016   plates & screws  . CESAREAN SECTION N/A 12/19/2017   Procedure: CESAREAN SECTION;  Surgeon: Lazaro Arms, MD;  Location: [facility] BIRTHING SU[facility];  Service: Obstetrics;  Laterality: N/A;    POb/GynH:      OB History    Gravida  6   Para  6   Term  5   Preterm  1   AB  0   Living  6     SAB  0   IAB  0   Ectopic  0   Multiple  0   Live Births  6        Obstetric Comments  G4=LTCS (not vertical) per op note        SH:   Social History   Tobacco Use  . Smoking status: Current Every Day Smoker    Packs/day: 1.00    Years: 7.00    Pack years: 7.00    Types: Cigarettes  . Smokeless tobacco: Never Used  Vaping Use  . Vaping Use: Former  Substance Use Topics  . Alcohol use: Not Currently    Alcohol/week: 0.0 standard drinks    Comment: occ; not now  . Drug use: Not Currently    Types: Other-see comments, Marijuana    Comment: huffed inhalants in past, "affected Brain"; as of 08/01/20 "oncce in the last 30 days"    FH:    Family History  Problem Relation Age of Onset  . Cancer Maternal Grandmother        breast  . Cancer Paternal Grandfather        lung cancer  . Migraines Father   .  Hypertension Father   . Hypercholesterolemia Father   . Other Son        ventricular defect  . Hypertension Other      Allergies:  Allergies  Allergen Reactions  . Penicillins Rash and Other (See Comments)    Unknown:  Childhood reaction.  Has patient had a PCN reaction causing immediate rash, facial/tongue/throat swelling, SOB or lightheadedness with hypotension: Unknown Has patient had a PCN reaction causing severe rash involving mucus membranes or skin necrosis: Unknown Has patient had a PCN reaction that required hospitalization No Has patient had a PCN reaction occurring within the last 10 years: No If all of the above answers are "NO", then may pro  . Sulfamethoxazole-Trimethoprim Hives    Medications:       Current Outpatient Medications:  .  ALPRAZolam (XANAX) 0.5 MG tablet, 3 times daily as needed for anxiety and/or sleep, Disp: 90 tablet, Rfl: 3 .  azithromycin (ZITHROMAX Z-PAK) 250 MG tablet, Take 2 tablets today and then 1 a  day til finished, Disp: 6 each, Rfl: 0 .  butalbital-acetaminophen-caffeine (FIORICET) 50-325-40 MG tablet, Take 1 tablet by mouth every 4 (four) hours as needed for headache or migraine., Disp: 20 tablet, Rfl: 0 .  cyclobenzaprine (FLEXERIL) 5 MG tablet, Take 1 tablet (5 mg total) by mouth at bedtime., Disp: 30 tablet, Rfl: 0 .  ibuprofen (ADVIL) 600 MG tablet, Take 1 tablet (600 mg total) by mouth every 6 (six) hours., Disp: 30 tablet, Rfl: 0 .  norethindrone (MICRONOR) 0.35 MG tablet, Take 1 tablet (0.35 mg total) by mouth daily., Disp: 28 tablet, Rfl: 11 .  nortriptyline (PAMELOR) 25 MG capsule, Take 1 capsule (25 mg total) by mouth at bedtime., Disp: 90 capsule, Rfl: 0 .  Prenatal Vit-Fe Fumarate-FA (PREPLUS) 27-1 MG TABS, TAKE ONE TABLET BY MOUTH AT 12 NOON. (Patient taking differently: Take 1 tablet by mouth at bedtime.), Disp: 30 tablet, Rfl: prn  Review of Systems:   Review of Systems  Constitutional: Negative for fever, chills, weight  loss, malaise/fatigue and diaphoresis.  HENT: Negative for hearing loss, ear pain, nosebleeds, congestion, sore throat, neck pain, tinnitus and ear discharge.   Eyes: Negative for blurred vision, double vision, photophobia, pain, discharge and redness.  Respiratory: Negative for cough, hemoptysis, sputum production, shortness of breath, wheezing and stridor.   Cardiovascular: Negative for chest pain, palpitations, orthopnea, claudication, leg swelling and PND.  Gastrointestinal: Positive for abdominal pain. Negative for heartburn, nausea, vomiting, diarrhea, constipation, blood in stool and melena.  Genitourinary: Negative for dysuria, urgency, frequency, hematuria and flank pain.  Musculoskeletal: Negative for myalgias, back pain, joint pain and falls.  Skin: Negative for itching and rash.  Neurological: Negative for dizziness, tingling, tremors, sensory change, speech change, focal weakness, seizures, loss of consciousness, weakness and headaches.  Endo/Heme/Allergies: Negative for environmental allergies and polydipsia. Does not bruise/bleed easily.  Psychiatric/Behavioral: Negative for depression, suicidal ideas, hallucinations, memory loss and substance abuse. The patient is not nervous/anxious and does not have insomnia.      PHYSICAL EXAM:  Blood pressure 130/82, pulse 84, height 5\' 3"  (1.6 m), weight 163 lb (73.9 kg), last menstrual period 11/23/2019, currently breastfeeding.    Vitals reviewed. Constitutional: She is oriented to person, place, and time. She appears well-developed and well-nourished.  HENT:  Head: Normocephalic and atraumatic.  Right Ear: External ear normal.  Left Ear: External ear normal.  Nose: Nose normal.  Mouth/Throat: Oropharynx is clear and moist.  Eyes: Conjunctivae and EOM are normal. Pupils are equal, round, and reactive to light. Right eye exhibits no discharge. Left eye exhibits no discharge. No scleral icterus.  Neck: Normal range of motion. Neck  supple. No tracheal deviation present. No thyromegaly present.  Cardiovascular: Normal rate, regular rhythm, normal heart sounds and intact distal pulses.  Exam reveals no gallop and no friction rub.   No murmur heard. Respiratory: Effort normal and breath sounds normal. No respiratory distress. She has no wheezes. She has no rales. She exhibits no tenderness.  GI: Soft. Bowel sounds are normal. She exhibits no distension and no mass. There is tenderness. There is no rebound and no guarding.  Genitourinary:       Vulva is normal without lesions Vagina is pink moist without discharge Cervix normal in appearance and pap is normal Uterus is normal size, contour, position, consistency, mobility, non-tender Adnexa is negative with normal sized ovaries by sonogram  Musculoskeletal: Normal range of motion. She exhibits no edema and no tenderness.  Neurological: She is alert and oriented to  person, place, and time. She has normal reflexes. She displays normal reflexes. No cranial nerve deficit. She exhibits normal muscle tone. Coordination normal.  Skin: Skin is warm and dry. No rash noted. No erythema. No pallor.  Psychiatric: She has a normal mood and affect. Her behavior is normal. Judgment and thought content normal.    Labs: No results found for this or any previous visit (from the past 336 hour(s)).  EKG: Orders placed or performed during the hospital encounter of 12/14/17  . ED EKG  . ED EKG  . EKG 12-Lead  . EKG 12-Lead    Imaging Studies: US OB Limited  Result Date: 08/10/2020  [facility] [address] [place] [address] FOLLOW UP SONOGRAM Meagan [name] is in the office for a follow up sonogram for AFI. She is a 30 y.o. year old [phone] with Estimated Date of Delivery: 08/29/20 by LMP now at  [redacted]w[redacted]d weeks gestation. Thus far the pregnancy has been complicated by ? ruptured membrains. GESTATION: SINGLETON PRESENTATION: cephalic FETAL  ACTIVITY:          Heart rate         135          The fetus is active. AMNIOTIC FLUID: The amniotic fluid volume is  normal, 24 cm. PLACENTA LOCALIZATION:  anterior GRADE 3 CERVIX: Limited view GESTATIONAL AGE AND  BIOMETRICS: Gestational criteria: Estimated Date of Delivery: 08/29/20 by LMP now at [redacted]w[redacted]d Previous Scans:4 ANATOMICAL SURVEY                                                                            COMMENTS CEREBRAL VENTRICLES yes normal  CHOROID PLEXUS yes normal  CEREBELLUM yes normal  CISTERNA MAGNA  Yes  normal   CAVUM SEPTI PELLUCIDI YES NORMAL              NOSE/LIP yes normal  FACIAL PROFILE yes normal  4 CHAMBERED HEART yes normal  OUTFLOW TRACTS YES normaL                  DIAPHRAGM yes normal  STOMACH yes normal  RENAL REGION yes normal  BLADDER yes normal          3 VESSEL CORD yes normal              GENITALIA yes normal female     SUSPECTED ABNORMALITIES:  no QUALITY OF SCAN: satisfactory TECHNICIAN COMMENTS: [name] 37+2 wks,cephalic,AFI 24 cm,anterior placenta gr 3,fhr 135 bpm A copy of this report including all images has been saved and backed up to a second source for retrieval if needed. All measures and details of the anatomical scan, placentation, fluid volume and pelvic anatomy are contained in that report. Amber Flora Lipps 08/10/2020 10:53 AM Clinical Impression and recommendations: I have reviewed the sonogram results above, combined with the patient's current clinical course, below are my impressions and any appropriate recommendations for management based on the sonographic findings. 1.  [id number] Estimated Date of Delivery: 08/29/20 by serial sonographic evaluations 2.  Fetal sonographic surveillance findings: a). Normal fluid volume, borderline mild polyhydramnios 3.  Normal general sonographic findings Although sonogram should not over rule clinical  impression, there is stable fluid volume as compared to previous scans which does not support the patient history of SROM. Lazaro Arms  08/10/2020 5:23 PM   US OB Follow Up  Result Date: 08/05/2020  [facility] [address] [place] [address] FOLLOW UP SONOGRAM Meagan [name] is in the office for a follow up sonogram for EFW. She is a 30 y.o. year old [phone] with Estimated Date of Delivery: 08/29/20 by LMP now at  [redacted]w[redacted]d weeks gestation. Thus far the pregnancy has been complicated by smoker,hx of preclampsia,poly in prior preg. GESTATION: SINGLETON PRESENTATION: cephalic FETAL ACTIVITY:          Heart rate         133          The fetus is active. AMNIOTIC FLUID: The amniotic fluid volume is  normal, 22.7 cm. PLACENTA LOCALIZATION:  anterior GRADE 3 CERVIX: Limited view GESTATIONAL AGE AND  BIOMETRICS: Gestational criteria: Estimated Date of Delivery: 08/29/20 by LMP now at [redacted]w[redacted]d Previous Scans:3          BIPARIETAL DIAMETER           8.27 cm         33+1 weeks   1.4% HEAD CIRCUMFERENCE           31.32 cm         35 weeks    3.7% ABDOMINAL CIRCUMFERENCE           34.55 cm         38+3 weeks 96% FEMUR LENGTH           6.59 cm         33+6 weeks   2.9%                                                       AVERAGE EGA(BY THIS SCAN):  35+2 weeks                                                 ESTIMATED FETAL WEIGHT:       2932  grams, 50 % ANATOMICAL SURVEY                                                                            COMMENTS CEREBRAL VENTRICLES yes normal  CHOROID PLEXUS yes normal  CEREBELLUM yes normal  CISTERNA MAGNA  Yes  normal   CAVUM SEPTI PELLUCIDI YES NORMAL          NASAL BONE yes normal  NOSE/LIP yes normal  FACIAL PROFILE yes normal  4 CHAMBERED HEART yes normal  OUTFLOW TRACTS YES normaL  3VV YES NORMAL  3VTV YES NORMAL  SITUS YES NORMAL      DIAPHRAGM yes normal  STOMACH yes normal  RENAL REGION yes normal  BLADDER yes normal  3 VESSEL CORD yes normal              GENITALIA yes normal female     SUSPECTED ABNORMALITIES:  AC 96%,FL 2.9%,HC 3.7%,BPD 1.4% EFW WNL 50% QUALITY  OF SCAN: satisfactory TECHNICIAN COMMENTS: US 36+4 wks,cephalic,anterior placenta gr 3,AFI 22.7 cm,fhr 133 bpm,EFW 2932 g 50%,AC 96%,FL 2.9%,HC 3.7%,BPD 1.4% A copy of this report including all images has been saved and backed up to a second source for retrieval if needed. All measures and details of the anatomical scan, placentation, fluid volume and pelvic anatomy are contained in that report. Amber Flora LippsJ Carl 08/05/2020 11:25 AM Clinical Impression and recommendations: I have reviewed the sonogram results above, combined with the patient's current clinical course, below are my impressions and any appropriate recommendations for management based on the sonographic findings. 1.  [id number] Estimated Date of Delivery: 08/29/20 by serial sonographic evaluations 2.  Fetal sonographic surveillance findings: a). Normal fluid volume b). Normal growth percentile with appropriate interval growth:  50% 3.  Normal general sonographic findings Recommend continued prenatal evaluations and care based on this sonogram and as clinically indicated from the patient's clinical course. Lazaro ArmsLuther H Eure 08/05/2020 12:57 PM      Assessment: Patient Active Problem List   Diagnosis Date Noted  . Normal labor 08/10/2020  . Unwanted fertility 07/07/2020  . History of vaginal delivery following previous cesarean delivery 02/10/2020  . Short interval between pregnancies affecting pregnancy in first trimester, antepartum 12/23/2019  . Rh negative state in antepartum period 01/07/2019  . Previous cesarean section 03/10/2018  . 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  . Hx of preeclampsia 11/16/2015  . 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 Smoker 12/16/2013  . Anxiety 06/17/2013    Plan: Laparoscopic bilateral salpingectomy for sterilization  Lazaro ArmsLuther H Eure 08/29/2020 4:07 PM      Face to face time:  20 minutes  Greater than 50% of the visit time was spent in counseling and coordination of care with the patient.  The  summary and outline of the counseling and care coordination is summarized in the note above.   All questions were answered.

## 2020-09-14 ENCOUNTER — Ambulatory Visit: Payer: Medicaid Other | Admitting: Women's Health

## 2020-09-21 ENCOUNTER — Telehealth: Payer: Self-pay | Admitting: Obstetrics & Gynecology

## 2020-09-21 NOTE — Telephone Encounter (Signed)
Patient requesting refill on Fioricet.

## 2020-09-21 NOTE — Telephone Encounter (Signed)
PT needs refill on Fioricet medication. Also has some concerns/questions about heavy flow during period currently.

## 2020-09-22 ENCOUNTER — Other Ambulatory Visit: Payer: Self-pay | Admitting: Obstetrics & Gynecology

## 2020-09-22 MED ORDER — BUTALBITAL-APAP-CAFFEINE 50-325-40 MG PO TABS: 1.0000 | ORAL_TABLET | ORAL | 0 refills | Status: DC | PRN

## 2020-09-22 NOTE — Telephone Encounter (Signed)
Refilled the fioricet

## 2020-10-03 ENCOUNTER — Other Ambulatory Visit: Payer: Self-pay

## 2020-10-03 ENCOUNTER — Telehealth: Payer: Self-pay | Admitting: Obstetrics & Gynecology

## 2020-10-03 MED ORDER — ALPRAZOLAM 0.5 MG PO TABS: ORAL_TABLET | ORAL | 3 refills | Status: AC

## 2020-10-03 NOTE — Telephone Encounter (Signed)
Pt called to request a refill of her Alprazolam, please send to [facility]  Please advise & notify pt

## 2020-10-20 NOTE — Patient Instructions (Signed)
SHACARA COZINE  10/20/2020     @PREFPERIOPPHARMACY @   Your procedure is scheduled on  10/26/2020   Report to [facility] at  1230  P.M.   Call this number if you have problems the morning of surgery:  [phone]   Remember:  Do not eat any solid food after midnight. You may drink clear liquids until          Drink your carb drink at this time           . You may have nothing else to drink after your drink your carb drink.                      Take these medicines the morning of surgery with A SIP OF WATER  Xanax(if needed), fioricet.   Place clean sheets on your bed the night before your procedure and DO NOT sleep with pets this night.  Shower with CHG the night before and the morning of your procedure. DO NOT put CHG on your face, hair or genitals.  After each shower, dry off with a clean towel, put on clean, comfortable clothes and brush your teeth.      Do not wear jewelry, make-up or nail polish.  Do not wear lotions, powders, or perfumes, or deodorant.  Do not shave 48 hours prior to surgery.  Men may shave face and neck.  Do not bring valuables to the hospital.  [facility] is not responsible for any belongings or valuables.   Contacts, dentures or bridgework may not be worn into surgery.  Leave your suitcase in the car.  After surgery it may be brought to your room.  For patients admitted to the hospital, discharge time will be determined by your treatment team.  Patients discharged the day of surgery will not be allowed to drive home and must have someone with them for 24 hours.      Special instructions:  DO NOT smoke tobacco or vape for 24 hours before your procedure.   Please read over the following fact sheets that you were given. Coughing and Deep Breathing, Surgical Site Infection Prevention, Anesthesia Post-op Instructions and Care and Recovery After Surgery       Salpingectomy, Care After This sheet gives you information about  how to care for yourself after your procedure. Your health care provider may also give you more specific instructions. If you have problems or questions, contact your health care provider. What can I expect after the procedure? After the procedure, it is common to have:  Pain in your abdomen.  Some light vaginal bleeding (spotting) for a few days.  Tiredness. Your recovery time will vary depending on which method your surgeon used for your surgery. Follow these instructions at home: Incision care  Follow instructions from your health care provider about how to take care of your incisions. Make sure you: ? Wash your hands with soap and water before and after you change your bandage (dressing). If soap and water are not available, use hand sanitizer. ? Change or remove your dressing as told by your health care provider. ? Leave any stitches (sutures), skin glue, or adhesive strips in place. These skin closures may need to stay in place for 2 weeks or longer. If adhesive strip edges start to loosen and curl up, you may trim the loose edges. Do not remove adhesive strips completely unless your health care provider tells you to do  that.  Keep your dressing clean and dry.  Check your incision area every day for signs of infection. Check for: ? Redness, swelling, or pain that gets worse. ? Fluid or blood. ? Warmth. ? Pus or a bad smell.   Activity  Rest as told by your health care provider.  Avoid sitting for a long time without moving. Get up to take short walks every 1-2 hours. This is important to improve blood flow and breathing. Ask for help if you feel weak or unsteady.  Return to your normal activities as told by your health care provider. Ask your health care provider what activities are safe for you.  Do not drive until your health care provider says that it is safe.  Do not lift anything that is heavier than 10 lb (4.5 kg), or the limit that you are told, until your health care  provider says that it is safe. This may be 2-6 weeks depending on your surgery.  Until your health care provider approves: ? Do not douche. ? Do not use tampons. ? Do not have sex. Medicines  Take over-the-counter and prescription medicines only as told by your health care provider.  Ask your health care provider if the medicine prescribed to you: ? Requires you to avoid driving or using heavy machinery. ? Can cause constipation. You may need to take actions to prevent or treat constipation, such as:  Drink enough fluid to keep your urine pale yellow.  Take over-the-counter or prescription medicines.  Eat foods that are high in fiber, such as beans, whole grains, and fresh fruits and vegetables.  Limit foods that are high in fat and processed sugars, such as fried or sweet foods. General instructions  Wear compression stockings as told by your health care provider. These stockings help to prevent blood clots and reduce swelling in your legs.  Do not use any products that contain nicotine or tobacco, such as cigarettes, e-cigarettes, and chewing tobacco. If you need help quitting, ask your health care provider.  Do not take baths, swim, or use a hot tub until your health care provider approves. You may take showers.  Keep all follow-up visits as told by your health care provider. This is important. Contact a health care provider if you have:  Pain when you urinate.  Redness, swelling, or pain around an incision.  Fluid or blood coming from an incision.  Pus or a bad smell coming from an incision.  An incision that feels warm to the touch.  A fever.  Abdominal pain that gets worse or does not get better with medicine.  An incision that starts to break open.  A rash.  Light-headedness.  Nausea and vomiting. Get help right away if you:  Have pain in your chest or leg.  Develop shortness of breath.  Faint.  Have increased or heavy vaginal bleeding, such as  soaking a pad in an hour. Summary  After the procedure, it is common to feel tired, have some pain in your abdomen, and have some light vaginal bleeding for a few days.  Follow instructions from your health care provider about how to take care of your incisions.  Return to your normal activities as told by your health care provider. Ask your health care provider what activities are safe for you.  Do not douche, use tampons, or have sex until your health care provider approves.  Keep all follow-up visits as told by your health care provider. This information is not intended  to replace advice given to you by your health care provider. Make sure you discuss any questions you have with your health care provider. Document Revised: 06/09/2018 Document Reviewed: 06/09/2018 Elsevier Patient Education  2021 Elsevier Inc. General Anesthesia, Adult, Care After This sheet gives you information about how to care for yourself after your procedure. Your health care provider may also give you more specific instructions. If you have problems or questions, contact your health care provider. What can I expect after the procedure? After the procedure, the following side effects are common:  Pain or discomfort at the IV site.  Nausea.  Vomiting.  Sore throat.  Trouble concentrating.  Feeling cold or chills.  Feeling weak or tired.  Sleepiness and fatigue.  Soreness and body aches. These side effects can affect parts of the body that were not involved in surgery. Follow these instructions at home: For the time period you were told by your health care provider:  Rest.  Do not participate in activities where you could fall or become injured.  Do not drive or use machinery.  Do not drink alcohol.  Do not take sleeping pills or medicines that cause drowsiness.  Do not make important decisions or sign legal documents.  Do not take care of children on your own.   Eating and  drinking  Follow any instructions from your health care provider about eating or drinking restrictions.  When you feel hungry, start by eating small amounts of foods that are soft and easy to digest (bland), such as toast. Gradually return to your regular diet.  Drink enough fluid to keep your urine pale yellow.  If you vomit, rehydrate by drinking water, juice, or clear broth. General instructions  If you have sleep apnea, surgery and certain medicines can increase your risk for breathing problems. Follow instructions from your health care provider about wearing your sleep device: ? Anytime you are sleeping, including during daytime naps. ? While taking prescription pain medicines, sleeping medicines, or medicines that make you drowsy.  Have a responsible adult stay with you for the time you are told. It is important to have someone help care for you until you are awake and alert.  Return to your normal activities as told by your health care provider. Ask your health care provider what activities are safe for you.  Take over-the-counter and prescription medicines only as told by your health care provider.  If you smoke, do not smoke without supervision.  Keep all follow-up visits as told by your health care provider. This is important. Contact a health care provider if:  You have nausea or vomiting that does not get better with medicine.  You cannot eat or drink without vomiting.  You have pain that does not get better with medicine.  You are unable to pass urine.  You develop a skin rash.  You have a fever.  You have redness around your IV site that gets worse. Get help right away if:  You have difficulty breathing.  You have chest pain.  You have blood in your urine or stool, or you vomit blood. Summary  After the procedure, it is common to have a sore throat or nausea. It is also common to feel tired.  Have a responsible adult stay with you for the time you are  told. It is important to have someone help care for you until you are awake and alert.  When you feel hungry, start by eating small amounts of foods that are  soft and easy to digest (bland), such as toast. Gradually return to your regular diet.  Drink enough fluid to keep your urine pale yellow.  Return to your normal activities as told by your health care provider. Ask your health care provider what activities are safe for you. This information is not intended to replace advice given to you by your health care provider. Make sure you discuss any questions you have with your health care provider. Document Revised: 03/03/2020 Document Reviewed: 10/01/2019 Elsevier Patient Education  2021 [employer].

## 2020-10-23 ENCOUNTER — Other Ambulatory Visit: Payer: Self-pay | Admitting: Obstetrics & Gynecology

## 2020-10-24 ENCOUNTER — Encounter (HOSPITAL_COMMUNITY): Payer: Self-pay

## 2020-10-24 ENCOUNTER — Encounter (HOSPITAL_COMMUNITY)
Admission: RE | Admit: 2020-10-24 | Discharge: 2020-10-24 | Disposition: A | Discharge status: Home / Self Care | Payer: Medicaid Other | Source: Ambulatory Visit | Attending: Obstetrics & Gynecology | Admitting: Obstetrics & Gynecology

## 2020-10-24 ENCOUNTER — Other Ambulatory Visit (HOSPITAL_COMMUNITY): Payer: Medicaid Other

## 2020-10-26 ENCOUNTER — Encounter (HOSPITAL_COMMUNITY): Admission: RE | Payer: Self-pay | Source: Home / Self Care

## 2020-10-26 ENCOUNTER — Ambulatory Visit (HOSPITAL_COMMUNITY)
Admission: RE | Admit: 2020-10-26 | Payer: Medicaid Other | Source: Home / Self Care | Admitting: Obstetrics & Gynecology

## 2020-10-26 SURGERY — SALPINGECTOMY, BILATERAL, LAPAROSCOPIC
Anesthesia: General | Laterality: Bilateral

## 2020-11-04 ENCOUNTER — Telehealth: Payer: Medicaid Other | Admitting: Obstetrics & Gynecology

## 2020-11-21 ENCOUNTER — Other Ambulatory Visit: Payer: Self-pay | Admitting: Advanced Practice Midwife

## 2020-11-21 MED ORDER — FLUCONAZOLE 150 MG PO TABS: ORAL_TABLET | ORAL | 2 refills | Status: DC

## 2020-11-21 NOTE — Progress Notes (Signed)
Diflucan per pt request 

## 2020-12-05 ENCOUNTER — Other Ambulatory Visit: Payer: Self-pay

## 2020-12-05 ENCOUNTER — Other Ambulatory Visit (INDEPENDENT_AMBULATORY_CARE_PROVIDER_SITE_OTHER): Payer: Medicaid Other

## 2020-12-05 ENCOUNTER — Other Ambulatory Visit (HOSPITAL_COMMUNITY)
Admission: RE | Admit: 2020-12-05 | Discharge: 2020-12-05 | Disposition: A | Discharge status: Home / Self Care | Payer: Medicaid Other | Source: Ambulatory Visit | Attending: Obstetrics & Gynecology | Admitting: Obstetrics & Gynecology

## 2020-12-05 DIAGNOSIS — Z113 Encounter for screening for infections with a predominantly sexual mode of transmission: Secondary | ICD-10-CM | POA: Diagnosis present

## 2020-12-05 NOTE — Progress Notes (Signed)
   NURSE VISIT- VAGINITIS/STD  SUBJECTIVE:  Meagan [name] is a 30 y.o. [id number] GYN patientfemale here for a vaginal swab for vaginitis screening, STD screen.  She reports the following symptoms: discharge described as clear for 2 days with odor.  Denies abnormal vaginal bleeding, significant pelvic pain, fever, or UTI symptoms.  OBJECTIVE:  There were no vitals taken for this visit.  Appears well, in no apparent distress  ASSESSMENT: Vaginal swab for vaginitis screening, STD screen  PLAN: Self-collected vaginal probe for Gonorrhea, Chlamydia, Trichomonas, Bacterial Vaginosis, Yeast sent to lab Treatment: to be determined once results are received Follow-up as needed if symptoms persist/worsen, or new symptoms develop  Jobe Marker  12/05/2020 4:27 PM

## 2020-12-05 NOTE — Progress Notes (Signed)
Chart reviewed for nurse visit. Agree with plan of care.  Adline Potter, NP 12/05/2020 4:49 PM

## 2020-12-07 ENCOUNTER — Other Ambulatory Visit: Payer: Self-pay | Admitting: Adult Health

## 2020-12-07 ENCOUNTER — Other Ambulatory Visit: Payer: Self-pay | Admitting: Obstetrics & Gynecology

## 2020-12-07 LAB — CERVICOVAGINAL ANCILLARY ONLY
Bacterial Vaginitis (gardnerella): POSITIVE — AB
Candida Glabrata: NEGATIVE
Candida Vaginitis: NEGATIVE
Chlamydia: NEGATIVE
Comment: NEGATIVE
Comment: NEGATIVE
Comment: NEGATIVE
Comment: NEGATIVE
Comment: NEGATIVE
Comment: NORMAL
Neisseria Gonorrhea: NEGATIVE
Trichomonas: NEGATIVE

## 2020-12-07 MED ORDER — METRONIDAZOLE 0.75 % VA GEL: 1.0000 | Freq: Every day | VAGINAL | 0 refills | Status: DC

## 2020-12-07 NOTE — Progress Notes (Signed)
Rx metrogel  

## 2021-01-13 ENCOUNTER — Other Ambulatory Visit: Payer: Self-pay | Admitting: Obstetrics & Gynecology

## 2021-01-24 ENCOUNTER — Other Ambulatory Visit: Payer: Medicaid Other

## 2021-02-26 ENCOUNTER — Telehealth: Payer: Medicaid Other | Admitting: Family

## 2021-02-26 DIAGNOSIS — K5900 Constipation, unspecified: Secondary | ICD-10-CM

## 2021-02-26 MED ORDER — POLYETHYLENE GLYCOL 3350 17 GM/SCOOP PO POWD: 17.0000 g | Freq: Two times a day (BID) | ORAL | 1 refills | Status: DC | PRN

## 2021-02-26 NOTE — Progress Notes (Signed)
We are sorry that you are not feeling well.  Here is how we plan to help!  Based on your presentation I believe you most likely have constipation.   I recommend you start taking Miralax twice a day. Make sure you are drinking lots of fluids. I have sent a prescription to your pharmacy.    HOME CARE Only take medications as instructed by your medical team. Complete the entire course of an antibiotic. Drink plenty of fluids and get plenty of rest. Avoid close contacts especially the very young and the elderly Cover your mouth if you cough or cough into your sleeve. Always remember to wash your hands A steam or ultrasonic humidifier can help congestion.   GET HELP RIGHT AWAY IF: You develop worsening fever. You become short of breath You cough up blood. Your symptoms persist after you have completed your treatment plan MAKE SURE YOU  Understand these instructions. Will watch your condition. Will get help right away if you are not doing well or get worse.    Thank you for choosing an e-visit.  Your e-visit answers were reviewed by a board certified advanced clinical practitioner to complete your personal care plan. Depending upon the condition, your plan could have included both over the counter or prescription medications.  Please review your pharmacy choice. Make sure the pharmacy is open so you can pick up prescription now. If there is a problem, you may contact your provider through [employer] and have the prescription routed to another pharmacy.  Your safety is important to [name]. If you have drug allergies check your prescription carefully.   For the next 24 hours you can use MyChart to ask questions about today's visit, request a non-urgent call back, or ask for a work or school excuse. You will get an email in the next two days asking about your experience. I hope that your e-visit has been valuable and will speed your recovery.  Approximately 5 minutes was spent  documenting and reviewing patient's chart.

## 2021-02-26 NOTE — Progress Notes (Signed)
Awaiting patient's response to a couple questions.   Jannifer Rodney, FNP

## 2021-02-26 NOTE — Progress Notes (Signed)
Virtual Visit Consent   Meagan [name], you are scheduled for a virtual visit with a [facility] provider today.     Just as with appointments in the office, your consent must be obtained to participate.  Your consent will be active for this visit and any virtual visit you may have with one of our providers in the next 365 days.     If you have a MyChart account, a copy of this consent can be sent to you electronically.  All virtual visits are billed to your insurance company just like a traditional visit in the office.    As this is a virtual visit, video technology does not allow for your provider to perform a traditional examination.  This may limit your provider's ability to fully assess your condition.  If your provider identifies any concerns that need to be evaluated in person or the need to arrange testing (such as labs, EKG, etc.), we will make arrangements to do so.     Although advances in technology are sophisticated, we cannot ensure that it will always work on either your end or our end.  If the connection with a video visit is poor, the visit may have to be switched to a telephone visit.  With either a video or telephone visit, we are not always able to ensure that we have a secure connection.     I need to obtain your verbal consent now.   Are you willing to proceed with your visit today?    Meagan [name] has provided verbal consent on 02/26/2021 for a virtual visit (video or telephone).   Jannifer Rodney, FNP   Date: 02/26/2021 3:51 PM   Virtual Visit via Video Note   I, Jannifer Rodney, connected with  CHE BELOW  ([id number], 1990-12-23) on 02/26/21 at  3:45 PM EDT by a video-enabled telemedicine application and verified that I am speaking with the correct person using two identifiers.  Location: Patient: Virtual Visit Location Patient: Home Provider: Virtual Visit Location Provider: Home   I discussed the limitations of evaluation and management by  telemedicine and the availability of in person appointments. The patient expressed understanding and agreed to proceed.    History of Present Illness: Meagan [name] is a 30 y.o. who identifies as a female who was assigned female at birth, and is being seen today for constipation. She has taken Norco. She reports she has not had BM in 5 days. She has the sensation she needs to go, but states she can not push it out.   HPI: Constipation This is a new problem. The current episode started in the past 7 days. The problem is unchanged. Her stool frequency is 1 time per week or less. Pertinent negatives include no diarrhea, difficulty urinating, nausea or vomiting.   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  Has patient had a PCN  reaction causing immediate rash, facial/tongue/throat swelling, SOB or lightheadedness with hypotension: Unknown Has patient had a PCN reaction causing severe rash involving mucus membranes or skin necrosis: Unknown Has patient had a PCN reaction that required hospitalization No Has patient had a PCN reaction occurring within the last 10 years: No If all of the above answers are "NO", then may pro   Sulfamethoxazole-Trimethoprim Hives   Medications:  Current Outpatient Medications:    ALPRAZolam (XANAX) 0.5 MG tablet, 3 times daily as  needed for anxiety and/or sleep, Disp: 90 tablet, Rfl: 3   azithromycin (ZITHROMAX Z-PAK) 250 MG tablet, Take 2 tablets today and then 1 a day til finished, Disp: 6 each, Rfl: 0   butalbital-acetaminophen-caffeine (FIORICET) 50-325-40 MG tablet, TAKE (1) TABLET BY MOUTH EVERY 4 HOURS AS NEEDED FOR HEADACHE OR MIGRAINE, Disp: 20 tablet, Rfl: 0   cyclobenzaprine (FLEXERIL) 5 MG tablet, Take 1 tablet (5 mg total) by mouth at bedtime., Disp: 30 tablet, Rfl: 0   fluconazole (DIFLUCAN) 150 MG tablet, 1 po stat; repeat in 3 days, Disp: 2 tablet, Rfl: 2   ibuprofen (ADVIL) 600 MG tablet, Take 1 tablet (600 mg total) by mouth every 6 (six) hours., Disp: 30 tablet, Rfl: 0   metroNIDAZOLE (METROGEL VAGINAL) 0.75 % vaginal gel, Place 1 Applicatorful vaginally at bedtime., Disp: 70 g, Rfl: 0   norethindrone (MICRONOR) 0.35 MG tablet, Take 1 tablet (0.35 mg total) by mouth daily., Disp: 28 tablet, Rfl: 11   nortriptyline (PAMELOR) 25 MG capsule, Take 1 capsule (25 mg total) by mouth at bedtime., Disp: 90 capsule, Rfl: 0   polyethylene glycol powder (GLYCOLAX/MIRALAX) 17 GM/SCOOP powder, Take 17 g by mouth 2 (two) times daily as needed., Disp: 3350 g, Rfl: 1   Prenatal Vit-Fe Fumarate-FA (PREPLUS) 27-1 MG TABS, TAKE ONE TABLET BY MOUTH AT 12 NOON. (Patient taking differently: Take 1 tablet by mouth at bedtime.), Disp: 30 tablet, Rfl: prn  Observations/Objective: Patient is well-developed, well-nourished in no acute distress.  Resting comfortably at home.  Head is normocephalic, atraumatic.  No labored breathing. Speech is clear and coherent with logical content.  Patient is alert and oriented at baseline.    Assessment and Plan: 1. Constipation, unspecified constipation type Pt will try the Miralax BID  Force fluids If not BM will use an Enema Discussed she may have to self disimpact given she can feel stool in her rectum.  Follow Up Instructions: I discussed the assessment and treatment plan with the  patient. The patient was provided an opportunity to ask questions and all were answered. The patient agreed with the plan and demonstrated an understanding of the instructions.  A copy of instructions were sent to the patient via MyChart.  The patient was advised to call back or seek an in-person evaluation if the symptoms worsen or if the condition fails to improve as anticipated.  Time:  I spent 9 minutes with the patient via telehealth technology discussing the above problems/concerns.    Jannifer Rodney, FNP

## 2021-03-08 ENCOUNTER — Telehealth: Payer: Medicaid Other | Admitting: Physician Assistant

## 2021-03-08 DIAGNOSIS — R102 Pelvic and perineal pain: Secondary | ICD-10-CM

## 2021-03-09 NOTE — Progress Notes (Signed)
Based on what you shared with me, I feel your condition warrants further evaluation and I recommend that you be seen in a face to face visit. Giving abdominopelvic and vaginal pain, as well as severity of pain, you need to be evaluated in person either with your PCP, local Urgent Care, or if symptoms have worsened since submitting this e-visit, ER.    NOTE: There will be NO CHARGE for this eVisit   If you are having a true medical emergency please call 911.      For an urgent face to face visit, [facility] has six urgent care centers for your convenience:     [facility] at [facility] Directions [phone] [address] [place], [place] [address]    [facility] Urgent Care Center [facility]) Get Driving Directions [phone] [address] [place], [place] [address]  [facility] Health Urgent Care Center [facility]) Get Driving Directions [phone] [address] [place],  [place]  [address]  [facility] Health Urgent Care at [facility] Get Driving Directions [phone] [address] [place], [place] [address]   [facility] Urgent Care at [facility] Get Driving Directions  [phone] [address] [place], [place] [address]   [facility] Health Urgent Care at [facility] Directions [phone] [address] [place], [place] [address]  Your MyChart E-visit questionnaire answers were reviewed by a board certified advanced clinical practitioner to complete your personal care plan based on your specific symptoms.  Thank you for using e-Visits.

## 2021-03-18 ENCOUNTER — Telehealth: Payer: Medicaid Other | Admitting: Nurse Practitioner

## 2021-03-18 ENCOUNTER — Encounter: Payer: Self-pay | Admitting: Nurse Practitioner

## 2021-03-18 DIAGNOSIS — U071 COVID-19: Secondary | ICD-10-CM

## 2021-03-18 DIAGNOSIS — K0889 Other specified disorders of teeth and supporting structures: Secondary | ICD-10-CM | POA: Diagnosis not present

## 2021-03-18 MED ORDER — BENZONATATE 100 MG PO CAPS
100.0000 mg | ORAL_CAPSULE | Freq: Three times a day (TID) | ORAL | 0 refills | Status: AC | PRN
Start: 2021-03-18 — End: 2021-03-28

## 2021-03-18 MED ORDER — AZITHROMYCIN 250 MG PO TABS: ORAL_TABLET | ORAL | 0 refills | Status: AC

## 2021-03-18 NOTE — Progress Notes (Signed)
Virtual Visit Consent   BRADEN [name], you are scheduled for a virtual visit with a [facility] provider today.     Just as with appointments in the office, your consent must be obtained to participate.  Your consent will be active for this visit and any virtual visit you may have with one of our providers in the next 365 days.     If you have a MyChart account, a copy of this consent can be sent to you electronically.  All virtual visits are billed to your insurance company just like a traditional visit in the office.    As this is a virtual visit, video technology does not allow for your provider to perform a traditional examination.  This may limit your provider's ability to fully assess your condition.  If your provider identifies any concerns that need to be evaluated in person or the need to arrange testing (such as labs, EKG, etc.), we will make arrangements to do so.     Although advances in technology are sophisticated, we cannot ensure that it will always work on either your end or our end.  If the connection with a video visit is poor, the visit may have to be switched to a telephone visit.  With either a video or telephone visit, we are not always able to ensure that we have a secure connection.     I need to obtain your verbal consent now.   Are you willing to proceed with your visit today?    Meagan [name] has provided verbal consent on 03/18/2021 for a virtual visit (video or telephone).   Viviano Simas, FNP   Date: 03/18/2021 10:14 AM   Virtual Visit via Video Note   I, Viviano Simas, connected with  Meagan [name]  ([id number], [birth year]) on 03/18/21 at 10:15 AM EDT by a video-enabled telemedicine application and verified that I am speaking with the correct person using two identifiers.  Location: Patient: Virtual Visit Location Patient: Home Provider: Virtual Visit Location Provider: Office/Clinic   I discussed the limitations of evaluation and  management by telemedicine and the availability of in person appointments. The patient expressed understanding and agreed to proceed.    History of Present Illness: Meagan [name] is a 30 y.o. who identifies as a female who was assigned female at birth, and is being seen today for swelling behind her back molar. She has recently started to feel her wisdom teeth coming in. She was last at the dentist a few weeks ago, and has an appointment to have her wisdom teeth removed. Now has pain and swelling in the area.   She denies fever. When asked about any other systemic symptoms patient states that she tested positive for COVID six days ago as well. She does have an ongoing cough from that.   She has not been vaccinated for COVID denies a history of COVID infection.   She does have a history of asthma as a child. Denies any wheezing at this time.   Her cough is mostly dry.   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  Has patient had a PCN reaction causing immediate rash, facial/tongue/throat swelling, SOB or lightheadedness with hypotension: Unknown Has patient had a PCN reaction causing severe rash involving mucus membranes or skin necrosis: Unknown Has patient had a PCN reaction that required hospitalization No Has patient had a PCN reaction occurring within the  last 10 years: No If all of the above answers are "NO", then may pro   Sulfamethoxazole-Trimethoprim Hives   Medications:  Current Outpatient Medications:    ALPRAZolam (XANAX) 0.5 MG tablet, 3 times daily as needed for anxiety and/or sleep, Disp: 90 tablet, Rfl: 3   azithromycin (ZITHROMAX Z-PAK) 250 MG tablet, Take 2 tablets today and then 1 a day til finished, Disp: 6 each, Rfl: 0   butalbital-acetaminophen-caffeine (FIORICET) 50-325-40 MG tablet, TAKE (1) TABLET BY MOUTH EVERY 4 HOURS AS NEEDED FOR HEADACHE OR MIGRAINE, Disp: 20 tablet, Rfl: 0   cyclobenzaprine (FLEXERIL) 5 MG tablet, Take 1 tablet (5 mg total) by mouth at bedtime., Disp: 30 tablet, Rfl: 0   fluconazole (DIFLUCAN) 150 MG tablet, 1 po stat; repeat in 3 days, Disp: 2 tablet, Rfl: 2   ibuprofen (ADVIL) 600 MG tablet, Take 1 tablet (600 mg total) by mouth every 6 (six) hours., Disp: 30 tablet, Rfl: 0   metroNIDAZOLE (METROGEL VAGINAL) 0.75 % vaginal gel, Place 1 Applicatorful vaginally at bedtime., Disp: 70 g, Rfl: 0   norethindrone (MICRONOR) 0.35 MG tablet, Take 1 tablet (0.35 mg total) by mouth daily., Disp: 28 tablet, Rfl: 11   nortriptyline (PAMELOR) 25 MG capsule, Take 1 capsule (25 mg total) by mouth at bedtime., Disp: 90 capsule, Rfl: 0   polyethylene glycol powder (GLYCOLAX/MIRALAX) 17 GM/SCOOP powder, Take 17 g by mouth 2 (two) times daily as needed., Disp: 3350 g, Rfl: 1   Prenatal Vit-Fe Fumarate-FA (PREPLUS) 27-1 MG TABS, TAKE ONE TABLET BY MOUTH AT 12 NOON. (Patient taking differently: Take 1 tablet by mouth at bedtime.), Disp: 30 tablet, Rfl: prn  Observations/Objective: Patient is well-developed, well-nourished in no acute distress.  Resting comfortably at home.  Head is normocephalic, atraumatic.  No labored breathing.  Speech is clear and coherent with logical content.  Patient is alert and oriented at baseline.    Assessment and Plan: 1. COVID-19 Patient is outside of window for anti-viral management.  Due to underlying asthma and ongoing cough management is as follows:  - benzonatate (TESSALON) 100 MG capsule; Take 1 capsule (100 mg total) by mouth 3 (three) times daily as needed for up to 10 days for cough.  Dispense: 30 capsule; Refill: 0 - azithromycin (ZITHROMAX) 250 MG tablet; Take 2 tablets on day 1, then 1 tablet daily on days 2 through 5  Dispense: 6 tablet; Refill: 0  2. Tooth ache Likely pain is from tooth eruption without signs or symptoms of infection at this time. Starting antibiotics due to ongoing COVID cough as above patient instructed to call Dentist Monday to let them know she is having more pain and swelling in the area  600mg  ibuprofen for pain relief with food every 6 hours as needed   Seek immediate medical attention for any new or worsening symptoms.      Follow Up Instructions: I discussed the assessment and treatment plan with the patient. The patient was provided an opportunity to  ask questions and all were answered. The patient agreed with the plan and demonstrated an understanding of the instructions.  A copy of instructions were sent to the patient via MyChart.  The patient was advised to call back or seek an in-person evaluation if the symptoms worsen or if the condition fails to improve as anticipated.  Time:  I spent 15 minutes with the patient via telehealth technology discussing the above problems/concerns.    Apolonio Schneiders, FNP

## 2021-03-22 ENCOUNTER — Ambulatory Visit: Payer: Medicaid Other | Admitting: Adult Health

## 2021-04-11 ENCOUNTER — Ambulatory Visit
Admission: EM | Admit: 2021-04-11 | Discharge: 2021-04-11 | Disposition: A | Discharge status: Home / Self Care | Payer: Medicaid Other | Attending: Internal Medicine | Admitting: Internal Medicine

## 2021-04-11 ENCOUNTER — Emergency Department (HOSPITAL_COMMUNITY)
Admission: EM | Admit: 2021-04-11 | Discharge: 2021-04-11 | Disposition: A | Discharge status: Home / Self Care | Payer: Medicaid Other

## 2021-04-11 ENCOUNTER — Other Ambulatory Visit: Payer: Self-pay

## 2021-04-11 ENCOUNTER — Encounter: Payer: Self-pay | Admitting: Emergency Medicine

## 2021-04-11 DIAGNOSIS — K047 Periapical abscess without sinus: Secondary | ICD-10-CM | POA: Diagnosis not present

## 2021-04-11 MED ORDER — DEXAMETHASONE SODIUM PHOSPHATE 10 MG/ML IJ SOLN
5.0000 mg | Freq: Once | INTRAMUSCULAR | Status: AC
  Administered 2021-04-11: 5 mg via INTRAMUSCULAR

## 2021-04-11 NOTE — Discharge Instructions (Signed)
Continue current antibiotic regimen If you experience pain with eye movement, please go to the emergency room for further evaluation. Keep your follow-up appointment with your dentist.

## 2021-04-11 NOTE — ED Triage Notes (Signed)
Was seen at dentist yesterday.  Was referred to a specialist to see tomorrow.  Right side of face swollen.  Was placed on Augmentin BID

## 2021-04-11 NOTE — ED Provider Notes (Signed)
[facility]    CSN: [id number] Arrival date & time: 04/11/21  1808      History   Chief Complaint No chief complaint on file.   HPI Meagan [name] is a 30 y.o. female comes to urgent care today with painful swelling on the right side of the face.  Patient has dental pain like that.  She has some retained second right upper incisors.  Patient was started on Augmentin few days ago.  She continues to have swelling in the right side of the face.  She was advised to come to the urgent care for reevaluation.  No febrile episodes.  No pain with eye movement. No fever or chills.  No trauma to the face.  No overlying erythema.Marland Kitchen   HPI  Past Medical History:  Diagnosis Date   Abdominal cramps 12/06/2015   Anemia    Anxiety    Asthma    inhaler at bedside   Daily headache    Diarrhea 06/04/2013   Hemiplegic migraine    blurred vision, nausea, dizziness, numbness   Kidney stones    Mental disorder    huffed inhalants "affected brain"   Pyelonephritis complicating pregnancy, antepartum, first trimester 11/05/2011   Stomach ulcer     Patient Active Problem List   Diagnosis Date Noted   Normal labor 08/10/2020   Unwanted fertility 07/07/2020   History of vaginal delivery following previous cesarean delivery 02/10/2020   Short interval between pregnancies affecting pregnancy in first trimester, antepartum 12/23/2019   Rh negative state in antepartum period 01/07/2019   Previous cesarean section 03/10/2018   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   Hx of preeclampsia 11/16/2015   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   Smoker 12/16/2013   Anxiety 06/17/2013    Past Surgical History:  Procedure Laterality Date   ANKLE SURGERY Left 09/2016   plates & screws   CESAREAN SECTION N/A 12/19/2017   Procedure: CESAREAN SECTION;  Surgeon: Lazaro Arms, MD;  Location: [facility];  Service: Obstetrics;  Laterality: N/A;    OB History     Gravida  6    Para  6   Term  5   Preterm  1   AB  0   Living  6      SAB  0   IAB  0   Ectopic  0   Multiple  0   Live Births  6        Obstetric Comments  G4=LTCS (not vertical) per op note          Home Medications    Prior to Admission medications   Medication Sig Start Date End Date Taking? Authorizing Provider  ALPRAZolam Prudy Feeler) 0.5 MG tablet 3 times daily as needed for anxiety and/or sleep 10/03/20   Lazaro Arms, MD  butalbital-acetaminophen-caffeine (FIORICET) 50-325-40 MG tablet TAKE (1) TABLET BY MOUTH EVERY 4 HOURS AS NEEDED FOR HEADACHE OR MIGRAINE 01/14/21   Lazaro Arms, MD  cyclobenzaprine (FLEXERIL) 5 MG tablet Take 1 tablet (5 mg total) by mouth at bedtime. 08/13/20   Massie Maroon, FNP  fluconazole (DIFLUCAN) 150 MG tablet 1 po stat; repeat in 3 days 11/21/20   Cresenzo-Dishmon, Scarlette Calico, CNM  ibuprofen (ADVIL) 600 MG tablet Take 1 tablet (600 mg total) by mouth every 6 (six) hours. 08/12/20   Cresenzo-Dishmon, Scarlette Calico, CNM  norethindrone (MICRONOR) 0.35 MG tablet Take 1 tablet (0.35 mg total) by mouth daily. 08/25/20   Cresenzo-Dishmon, Scarlette Calico, CNM  nortriptyline (PAMELOR)  25 MG capsule Take 1 capsule (25 mg total) by mouth at bedtime. 05/02/20   Lomax, [name], NP  polyethylene glycol powder (GLYCOLAX/MIRALAX) 17 GM/SCOOP powder Take 17 g by mouth 2 (two) times daily as needed. 02/26/21   Junie Spencer, FNP  Prenatal Vit-Fe Fumarate-FA (PREPLUS) 27-1 MG TABS TAKE ONE TABLET BY MOUTH AT 12 NOON. Patient taking differently: Take 1 tablet by mouth at bedtime. 12/22/19   Adline Potter, NP    Family History Family History  Problem Relation Age of Onset   Cancer Maternal Grandmother        breast   Cancer Paternal Grandfather        lung cancer   Migraines Father    Hypertension Father    Hypercholesterolemia Father    Other Son        ventricular defect   Hypertension Other     Social History Social History   Tobacco Use   Smoking status: Every  Day    Packs/day: 1.00    Years: 7.00    Pack years: 7.00    Types: Cigarettes   Smokeless tobacco: Never  Vaping Use   Vaping Use: Former  Substance Use Topics   Alcohol use: Not Currently    Alcohol/week: 0.0 standard drinks    Comment: occ; not now   Drug use: Not Currently    Types: Other-see comments, Marijuana    Comment: huffed inhalants in past, "affected Brain"; as of 08/01/20 "oncce in the last 30 days"     Allergies   Penicillins and Sulfamethoxazole-trimethoprim   Review of Systems Review of Systems As per HPI  Physical Exam Triage Vital Signs ED Triage Vitals  Enc Vitals Group     BP 04/11/21 1915 131/70     Pulse Rate 04/11/21 1915 85     Resp 04/11/21 1915 18     Temp 04/11/21 1915 98.7 F (37.1 C)     Temp Source 04/11/21 1915 Oral     SpO2 04/11/21 1915 99 %     Weight --      Height --      Head Circumference --      Peak Flow --      Pain Score 04/11/21 1916 1     Pain Loc --      Pain Edu? --      Excl. in GC? --    No data found.  Updated Vital Signs BP 131/70 (BP Location: Right Arm)   Pulse 85   Temp 98.7 F (37.1 C) (Oral)   Resp 18   SpO2 99%   Visual Acuity Right Eye Distance:   Left Eye Distance:   Bilateral Distance:    Right Eye Near:   Left Eye Near:    Bilateral Near:     Physical Exam Vitals reviewed.  Constitutional:      Appearance: Normal appearance.  HENT:     Nose: Nose normal.     Comments:  Peripheral swelling.  No overlying erythema.  Dental hygiene is fair.  No dental cavity noted in the second right maxillary incisor.    Mouth/Throat:     Mouth: Mucous membranes are moist.  Cardiovascular:     Rate and Rhythm: Normal rate.  Neurological:     Mental Status: She is alert.     UC Treatments / Results  Labs (all labs ordered are listed, but only abnormal results are displayed) Labs Reviewed - No data to display  EKG  Radiology No results found.  Procedures Procedures (including critical  care time)  Medications Ordered in UC Medications  dexamethasone (DECADRON) injection 5 mg (5 mg Intramuscular Given 04/11/21 1947)    Initial Impression / Assessment and Plan / UC Course  I have reviewed the triage vital signs and the nursing notes.  Pertinent labs & imaging results that were available during my care of the patient were reviewed by me and considered in my medical decision making (see chart for details).    1.  Dental infection: Dexamethasone 5 mg IM x1 dose Continue Augmentin Ibuprofen as needed for pain and/or fever Please follow-up with your dentist for dental work.  Final Clinical Impressions(s) / UC Diagnoses   Final diagnoses:  Dental infection     Discharge Instructions      Continue current antibiotic regimen If you experience pain with eye movement, please go to the emergency room for further evaluation. Keep your follow-up appointment with your dentist.   ED Prescriptions   None    PDMP not reviewed this encounter.   Merrilee Jansky, MD 04/15/21 [phone]

## 2021-05-05 ENCOUNTER — Other Ambulatory Visit: Payer: Medicaid Other

## 2021-05-08 ENCOUNTER — Other Ambulatory Visit (HOSPITAL_COMMUNITY)
Admission: RE | Admit: 2021-05-08 | Discharge: 2021-05-08 | Disposition: A | Discharge status: Home / Self Care | Payer: Medicaid Other | Source: Ambulatory Visit | Attending: Obstetrics & Gynecology | Admitting: Obstetrics & Gynecology

## 2021-05-08 ENCOUNTER — Other Ambulatory Visit (INDEPENDENT_AMBULATORY_CARE_PROVIDER_SITE_OTHER): Payer: Medicaid Other

## 2021-05-08 ENCOUNTER — Other Ambulatory Visit: Payer: Self-pay

## 2021-05-08 DIAGNOSIS — N898 Other specified noninflammatory disorders of vagina: Secondary | ICD-10-CM

## 2021-05-08 NOTE — Progress Notes (Addendum)
   NURSE VISIT- VAGINITIS/STD  SUBJECTIVE:  Meagan [name] is a 30 y.o. [phone] postpartumfemale here for a vaginal swab for vaginitis screening, STD screen.  She reports the following symptoms: discharge described as clear and odor for 3 weeks. Denies abnormal vaginal bleeding, significant pelvic pain, fever, or UTI symptoms.  OBJECTIVE:  There were no vitals taken for this visit.  Appears well, in no apparent distress  ASSESSMENT: Vaginal swab for  vaginitis & STD screening  PLAN: Self-collected vaginal probe for Gonorrhea, Chlamydia, Trichomonas, Bacterial Vaginosis, Yeast sent to lab Treatment: to be determined once results are received Follow-up as needed if symptoms persist/worsen, or new symptoms develop  Sharona Rovner A Lyle Niblett  05/08/2021 3:51 PM   Chart reviewed for nurse visit. Agree with plan of care.  [name], [place] 05/08/2021 5:06 PM

## 2021-05-10 ENCOUNTER — Other Ambulatory Visit: Payer: Self-pay | Admitting: Women's Health

## 2021-05-10 LAB — CERVICOVAGINAL ANCILLARY ONLY
Bacterial Vaginitis (gardnerella): POSITIVE — AB
Candida Glabrata: NEGATIVE
Candida Vaginitis: NEGATIVE
Chlamydia: NEGATIVE
Comment: NEGATIVE
Comment: NEGATIVE
Comment: NEGATIVE
Comment: NEGATIVE
Comment: NEGATIVE
Comment: NORMAL
Neisseria Gonorrhea: NEGATIVE
Trichomonas: NEGATIVE

## 2021-05-10 MED ORDER — METRONIDAZOLE 500 MG PO TABS: 500.0000 mg | ORAL_TABLET | Freq: Two times a day (BID) | ORAL | 0 refills | Status: DC

## 2021-05-17 ENCOUNTER — Telehealth: Payer: Self-pay | Admitting: Adult Health

## 2021-05-17 MED ORDER — METRONIDAZOLE 0.75 % VA GEL: VAGINAL | 0 refills | Status: DC

## 2021-05-17 NOTE — Telephone Encounter (Signed)
Returned pt's call. Two identifiers used. Pt states she is having stomach pain, N/V, and headaches. She has only been able to take 2 tablets of Flagyl so far. Pt was asked if she was taking the med with food and she denies any alcohol use. Pt informed of med prescribed in gel form and she is willing to try that.

## 2021-05-17 NOTE — Telephone Encounter (Signed)
Patient called stating that she was placed on an antibiotic that has been causing her to have severe stomach pain, with vomiting and nausea. Patient states that she would like to know if there is another medication she could take besides this one and that there is only one pill to take. Please contact pt

## 2021-05-17 NOTE — Addendum Note (Signed)
Addended by: Lazaro Arms on: 05/17/2021 02:11 PM   Modules accepted: Orders

## 2021-06-28 ENCOUNTER — Other Ambulatory Visit: Payer: Self-pay | Admitting: Advanced Practice Midwife

## 2021-07-11 ENCOUNTER — Other Ambulatory Visit: Payer: Medicaid Other

## 2021-08-07 ENCOUNTER — Other Ambulatory Visit: Payer: Medicaid Other

## 2021-08-10 ENCOUNTER — Telehealth: Payer: Medicaid Other | Admitting: Physician Assistant

## 2021-08-10 DIAGNOSIS — J019 Acute sinusitis, unspecified: Secondary | ICD-10-CM

## 2021-08-10 DIAGNOSIS — N760 Acute vaginitis: Secondary | ICD-10-CM

## 2021-08-10 DIAGNOSIS — B9689 Other specified bacterial agents as the cause of diseases classified elsewhere: Secondary | ICD-10-CM | POA: Diagnosis not present

## 2021-08-10 MED ORDER — METRONIDAZOLE 500 MG PO TABS: 500.0000 mg | ORAL_TABLET | Freq: Two times a day (BID) | ORAL | 0 refills | Status: AC

## 2021-08-10 MED ORDER — PSEUDOEPH-BROMPHEN-DM 30-2-10 MG/5ML PO SYRP: 5.0000 mL | ORAL_SOLUTION | Freq: Four times a day (QID) | ORAL | 0 refills | Status: DC | PRN

## 2021-08-10 MED ORDER — DOXYCYCLINE HYCLATE 100 MG PO TABS: 100.0000 mg | ORAL_TABLET | Freq: Two times a day (BID) | ORAL | 0 refills | Status: DC

## 2021-08-10 NOTE — Progress Notes (Signed)
Virtual Visit Consent   Meagan [name], you are scheduled for a virtual visit with a [facility] provider today.     Just as with appointments in the office, your consent must be obtained to participate.  Your consent will be active for this visit and any virtual visit you may have with one of our providers in the next 365 days.     If you have a MyChart account, a copy of this consent can be sent to you electronically.  All virtual visits are billed to your insurance company just like a traditional visit in the office.    As this is a virtual visit, video technology does not allow for your provider to perform a traditional examination.  This may limit your provider's ability to fully assess your condition.  If your provider identifies any concerns that need to be evaluated in person or the need to arrange testing (such as labs, EKG, etc.), we will make arrangements to do so.     Although advances in technology are sophisticated, we cannot ensure that it will always work on either your end or our end.  If the connection with a video visit is poor, the visit may have to be switched to a telephone visit.  With either a video or telephone visit, we are not always able to ensure that we have a secure connection.     I need to obtain your verbal consent now.   Are you willing to proceed with your visit today?    Meagan [name] has provided verbal consent on 08/10/2021 for a virtual visit (video or telephone).   Meagan Loveless, PA-C   Date: 08/10/2021 11:08 AM   Virtual Visit via Video Note   I, Meagan [name], connected with  Meagan [name]  ([id number], Meagan [name]) on 08/10/21 at 11:00 AM EST by a video-enabled telemedicine application and verified that I am speaking with the correct person using two identifiers.  Location: Patient: Virtual Visit Location Patient: Home Provider: Virtual Visit Location Provider: Home Office   I discussed the limitations of evaluation  and management by telemedicine and the availability of in person appointments. The patient expressed understanding and agreed to proceed.    History of Present Illness: Meagan [name] is a 31 y.o. who identifies as a female who was assigned female at birth, and is being seen today for possible sinus infection.  HPI: Sinusitis This is a new problem. The current episode started 1 to 4 weeks ago. The problem has been gradually worsening since onset. There has been no fever. Associated symptoms include congestion, coughing, ear pain (popping; not pain; bilateral), headaches, sinus pressure and a sore throat (mild; intermittent). Pertinent negatives include no hoarse voice. Past treatments include acetaminophen. The treatment provided no relief.  Vaginal Discharge The patient's primary symptoms include a genital odor and vaginal discharge. The patient's pertinent negatives include no genital itching, genital lesions or genital rash. This is a recurrent problem. The current episode started in the past 7 days. The problem occurs constantly. The problem has been unchanged. The patient is experiencing no pain. She is not pregnant. Associated symptoms include headaches and a sore throat (mild; intermittent). The vaginal discharge was white, thin and malodorous.     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  Has patient had a PCN reaction causing immediate rash, facial/tongue/throat swelling, SOB or lightheadedness with hypotension: Unknown Has patient had a PCN reaction causing severe rash involving mucus membranes or skin necrosis: Unknown Has patient had a PCN reaction that required hospitalization No Has patient had a PCN reaction occurring within the last 10 years: No If all of the above answers are "NO", then may pro   Sulfamethoxazole-Trimethoprim Hives   Medications:  Current Outpatient Medications:    brompheniramine-pseudoephedrine-DM 30-2-10 MG/5ML syrup, Take 5 mLs by mouth 4 (four) times daily as needed., Disp: 120 mL, Rfl: 0   doxycycline (VIBRA-TABS) 100 MG tablet, Take 1 tablet (100 mg total) by mouth 2 (two) times daily., Disp: 20 tablet, Rfl: 0   metroNIDAZOLE (FLAGYL) 500 MG tablet, Take 1 tablet (500 mg total) by mouth 2 (two) times daily for 7 days., Disp: 14 tablet, Rfl: 0   ALPRAZolam (XANAX) 0.5 MG tablet, 3 times daily as needed for anxiety and/or sleep, Disp: 90 tablet, Rfl: 3   butalbital-acetaminophen-caffeine (FIORICET) 50-325-40 MG tablet, TAKE (1) TABLET BY MOUTH EVERY 4 HOURS AS NEEDED FOR HEADACHE OR MIGRAINE, Disp: 20 tablet, Rfl: 0   cyclobenzaprine (FLEXERIL) 5 MG tablet, Take 1 tablet (5 mg total) by mouth at bedtime., Disp: 30 tablet, Rfl: 0   fluconazole (DIFLUCAN) 150 MG tablet, 1 po stat; repeat in 3 days, Disp: 2 tablet, Rfl: 2   ibuprofen (ADVIL) 600 MG tablet, Take 1 tablet (600 mg total) by mouth every 6 (six) hours., Disp: 30 tablet, Rfl: 0   metroNIDAZOLE (METROGEL VAGINAL) 0.75 % vaginal gel, Nightly x 5 nights, Disp: 70 g, Rfl: 0   norethindrone (MICRONOR) 0.35 MG tablet, TAKE 1 TABLET BY MOUTH ONCE A DAY., Disp: 28 tablet, Rfl: 11   nortriptyline (PAMELOR) 25 MG capsule, Take 1 capsule (25 mg total) by mouth at bedtime., Disp: 90 capsule, Rfl: 0   polyethylene glycol powder (GLYCOLAX/MIRALAX) 17 GM/SCOOP powder, Take  17 g by mouth 2 (two) times daily as needed., Disp: 3350 g, Rfl: 1   Prenatal Vit-Fe Fumarate-FA (PREPLUS) 27-1 MG TABS, TAKE ONE TABLET BY MOUTH AT 12 NOON. (Patient taking differently: Take 1 tablet by mouth at bedtime.), Disp: 30 tablet, Rfl: prn  Observations/Objective: Patient is well-developed, well-nourished in no acute distress.  Resting comfortably at home.  Head is normocephalic, atraumatic.  No labored breathing.  Speech is clear and coherent with logical content.  Patient is alert and oriented at baseline.    Assessment and Plan: 1. Acute bacterial sinusitis - doxycycline (VIBRA-TABS) 100 MG tablet; Take 1 tablet (100 mg total) by mouth 2 (two) times daily.  Dispense: 20 tablet; Refill: 0 - brompheniramine-pseudoephedrine-DM 30-2-10 MG/5ML syrup; Take 5 mLs by mouth 4 (four) times daily as needed.  Dispense: 120 mL; Refill: 0  2. BV (bacterial vaginosis) - metroNIDAZOLE (FLAGYL) 500 MG tablet; Take 1 tablet (500 mg total) by mouth 2 (two) times daily for 7 days.  Dispense: 14 tablet; Refill: 0  - Worsening symptoms that have not responded to OTC medications.  - Will give Doxycycline -  Continue allergy medications.  - Stay well hydrated and get plenty of rest.  - Metronidazole for BV symptoms  - Seek in person evaluation if no symptom improvement or if symptoms worsen.'   Follow Up Instructions: I discussed the assessment and treatment plan with the patient. The patient was provided an opportunity to ask questions and all were answered. The patient agreed with the plan and demonstrated an understanding of the instructions.  A copy of instructions were sent to the patient via MyChart unless otherwise noted below.    The patient was advised to call back or seek an in-person evaluation if the symptoms worsen or if the condition fails to improve as anticipated.  Time:  I spent 10 minutes with the patient via telehealth technology discussing the above problems/concerns.     Meagan Loveless, PA-C

## 2021-08-10 NOTE — Patient Instructions (Signed)
Meagan [name], thank you for joining [name], PA-C for today's virtual visit.  While this provider is not your primary care provider (PCP), if your PCP is located in our provider database this encounter information will be shared with them immediately following your visit.  Consent: (Patient) Meagan [name] provided verbal consent for this virtual visit at the beginning of the encounter.  Current Medications:  Current Outpatient Medications:    brompheniramine-pseudoephedrine-DM 30-2-10 MG/5ML syrup, Take 5 mLs by mouth 4 (four) times daily as needed., Disp: 120 mL, Rfl: 0   doxycycline (VIBRA-TABS) 100 MG tablet, Take 1 tablet (100 mg total) by mouth 2 (two) times daily., Disp: 20 tablet, Rfl: 0   metroNIDAZOLE (FLAGYL) 500 MG tablet, Take 1 tablet (500 mg total) by mouth 2 (two) times daily for 7 days., Disp: 14 tablet, Rfl: 0   ALPRAZolam (XANAX) 0.5 MG tablet, 3 times daily as needed for anxiety and/or sleep, Disp: 90 tablet, Rfl: 3   butalbital-acetaminophen-caffeine (FIORICET) 50-325-40 MG tablet, TAKE (1) TABLET BY MOUTH EVERY 4 HOURS AS NEEDED FOR HEADACHE OR MIGRAINE, Disp: 20 tablet, Rfl: 0   cyclobenzaprine (FLEXERIL) 5 MG tablet, Take 1 tablet (5 mg total) by mouth at bedtime., Disp: 30 tablet, Rfl: 0   fluconazole (DIFLUCAN) 150 MG tablet, 1 po stat; repeat in 3 days, Disp: 2 tablet, Rfl: 2   ibuprofen (ADVIL) 600 MG tablet, Take 1 tablet (600 mg total) by mouth every 6 (six) hours., Disp: 30 tablet, Rfl: 0   metroNIDAZOLE (METROGEL VAGINAL) 0.75 % vaginal gel, Nightly x 5 nights, Disp: 70 g, Rfl: 0   norethindrone (MICRONOR) 0.35 MG tablet, TAKE 1 TABLET BY MOUTH ONCE A DAY., Disp: 28 tablet, Rfl: 11   nortriptyline (PAMELOR) 25 MG capsule, Take 1 capsule (25 mg total) by mouth at bedtime., Disp: 90 capsule, Rfl: 0   polyethylene glycol powder (GLYCOLAX/MIRALAX) 17 GM/SCOOP powder, Take 17 g by mouth 2 (two) times daily as needed., Disp: 3350 g, Rfl: 1    Prenatal Vit-Fe Fumarate-FA (PREPLUS) 27-1 MG TABS, TAKE ONE TABLET BY MOUTH AT 12 NOON. (Patient taking differently: Take 1 tablet by mouth at bedtime.), Disp: 30 tablet, Rfl: prn   Medications ordered in this encounter:  Meds ordered this encounter  Medications   doxycycline (VIBRA-TABS) 100 MG tablet    Sig: Take 1 tablet (100 mg total) by mouth 2 (two) times daily.    Dispense:  20 tablet    Refill:  0    Order Specific Question:   Supervising Provider    Answer:   Hyacinth Meeker, BRIAN [[id number]]   brompheniramine-pseudoephedrine-DM 30-2-10 MG/5ML syrup    Sig: Take 5 mLs by mouth 4 (four) times daily as needed.    Dispense:  120 mL    Refill:  0    Order Specific Question:   Supervising Provider    Answer:   MILLER, BRIAN [[id number]]   metroNIDAZOLE (FLAGYL) 500 MG tablet    Sig: Take 1 tablet (500 mg total) by mouth 2 (two) times daily for 7 days.    Dispense:  14 tablet    Refill:  0    Order Specific Question:   Supervising Provider    Answer:   Hyacinth Meeker, BRIAN [[id number]]     *If you need refills on other medications prior to your next appointment, please contact your pharmacy*  Follow-Up: Call back or seek an in-person evaluation if the symptoms worsen or if the condition fails to improve as anticipated.  Other Instructions Sinusitis, Adult Sinusitis is soreness and swelling (inflammation) of your sinuses. Sinuses are hollow spaces in the bones around your face. They are located: Around your eyes. In the middle of your forehead. Behind your nose. In your cheekbones. Your sinuses and nasal passages are lined with a fluid called mucus. Mucus drains out of your sinuses. Swelling can trap mucus in your sinuses. This lets germs (bacteria, virus, or fungus) grow, which leads to infection. Most of the time, this condition is caused by a virus. What are the causes? This condition is caused by: Allergies. Asthma. Germs. Things that block your nose or sinuses. Growths in the nose (nasal  polyps). Chemicals or irritants in the air. Fungus (rare). What increases the risk? You are more likely to develop this condition if: You have a weak body defense system (immune system). You do a lot of swimming or diving. You use nasal sprays too much. You smoke. What are the signs or symptoms? The main symptoms of this condition are pain and a feeling of pressure around the sinuses. Other symptoms include: Stuffy nose (congestion). Runny nose (drainage). Swelling and warmth in the sinuses. Headache. Toothache. A cough that may get worse at night. Mucus that collects in the throat or the back of the nose (postnasal drip). Being unable to smell and taste. Being very tired (fatigue). A fever. Sore throat. Bad breath. How is this diagnosed? This condition is diagnosed based on: Your symptoms. Your medical history. A physical exam. Tests to find out if your condition is short-term (acute) or long-term (chronic). Your doctor may: Check your nose for growths (polyps). Check your sinuses using a tool that has a light (endoscope). Check for allergies or germs. Do imaging tests, such as an MRI or CT scan. How is this treated? Treatment for this condition depends on the cause and whether it is short-term or long-term. If caused by a virus, your symptoms should go away on their own within 10 days. You may be given medicines to relieve symptoms. They include: Medicines that shrink swollen tissue in the nose. Medicines that treat allergies (antihistamines). A spray that treats swelling of the nostrils.  Rinses that help get rid of thick mucus in your nose (nasal saline washes). If caused by bacteria, your doctor may wait to see if you will get better without treatment. You may be given antibiotic medicine if you have: A very bad infection. A weak body defense system. If caused by growths in the nose, you may need to have surgery. Follow these instructions at home: Medicines Take,  use, or apply over-the-counter and prescription medicines only as told by your doctor. These may include nasal sprays. If you were prescribed an antibiotic medicine, take it as told by your doctor. Do not stop taking the antibiotic even if you start to feel better. Hydrate and humidify  Drink enough water to keep your pee (urine) pale yellow. Use a cool mist humidifier to keep the humidity level in your home above 50%. Breathe in steam for 10-15 minutes, 3-4 times a day, or as told by your doctor. You can do this in the bathroom while a hot shower is running. Try not to spend time in cool or dry air. Rest Rest as much as you can. Sleep with your head raised (elevated). Make sure you get enough sleep each night. General instructions  Put a warm, moist washcloth on your face 3-4 times a day, or as often as told by your doctor. This will  help with discomfort. Wash your hands often with soap and water. If there is no soap and water, use hand sanitizer. Do not smoke. Avoid being around people who are smoking (secondhand smoke). Keep all follow-up visits as told by your doctor. This is important. Contact a doctor if: You have a fever. Your symptoms get worse. Your symptoms do not get better within 10 days. Get help right away if: You have a very bad headache. You cannot stop throwing up (vomiting). You have very bad pain or swelling around your face or eyes. You have trouble seeing. You feel confused. Your neck is stiff. You have trouble breathing. Summary Sinusitis is swelling of your sinuses. Sinuses are hollow spaces in the bones around your face. This condition is caused by tissues in your nose that become inflamed or swollen. This traps germs. These can lead to infection. If you were prescribed an antibiotic medicine, take it as told by your doctor. Do not stop taking it even if you start to feel better. Keep all follow-up visits as told by your doctor. This is important. This  information is not intended to replace advice given to you by your health care provider. Make sure you discuss any questions you have with your health care provider. Document Revised: 11/18/2017 Document Reviewed: 11/18/2017 Elsevier Patient Education  2022 Elsevier Inc.   Bacterial Vaginosis Bacterial vaginosis is an infection of the vagina. It happens when too many normal germs (healthy bacteria) grow in the vagina. This infection can make it easier to get other infections from sex (STIs). It is very important for pregnant women to get treated. This infection can cause babies to be born early or at a low birth weight. What are the causes? This infection is caused by an increase in certain germs that grow in the vagina. You cannot get this infection from toilet seats, bedsheets, swimming pools, or things that touch your vagina. What increases the risk? Having sex with a new person or more than one person. Having sex without protection. Douching. Having an intrauterine device (IUD). Smoking. Using drugs or drinking alcohol. These can lead you to do things that are risky. Taking certain antibiotic medicines. Being pregnant. What are the signs or symptoms? Some women have no symptoms. Symptoms may include: A discharge from your vagina. It may be gray or white. It can be watery or foamy. A fishy smell. This can happen after sex or during your menstrual period. Itching in and around your vagina. A feeling of burning or pain when you pee (urinate). How is this treated? This infection is treated with antibiotic medicines. These may be given to you as: A pill. A cream for your vagina. A medicine that you put into your vagina (suppository). If the infection comes back after treatment, you may need more antibiotics. Follow these instructions at home: Medicines Take over-the-counter and prescription medicines as told by your doctor. Take or use your antibiotic medicine as told by your  doctor. Do not stop taking or using it, even if you start to feel better. General instructions If the person you have sex with is a woman, tell her that you have this infection. She will need to follow up with her doctor. If you have a female partner, he does not need to be treated. Do not have sex until you finish treatment. Drink enough fluid to keep your pee pale yellow. Keep your vagina and butt clean. Wash the area with warm water each day. Wipe from front to  back after you use the toilet. If you are breastfeeding a baby, ask your doctor if you should keep doing so during treatment. Keep all follow-up visits. How is this prevented? Self-care Do not douche. Use only warm water to wash around your vagina. Wear underwear that is cotton or lined with cotton. Do not wear tight pants and pantyhose, especially in the summer. Safe sex Use protection when you have sex. This includes: Use condoms. Use dental dams. This is a thin layer that protects the mouth during oral sex. Limit how many people you have sex with. To prevent this infection, it is best to have sex with just one person. Get tested for STIs. The person you have sex with should also get tested. Drugs and alcohol Do not smoke or use any products that contain nicotine or tobacco. If you need help quitting, ask your doctor. Do not use drugs. Do not drink alcohol if: Your doctor tells you not to drink. You are pregnant, may be pregnant, or are planning to become pregnant. If you drink alcohol: Limit how much you have to 0-1 drink a day. Know how much alcohol is in your drink. In the [place] one drink equals one 12 oz bottle of beer (355 mL), one 5 oz glass of wine (148 mL), or one 1 oz glass of hard liquor (44 mL). Where to find more information Centers for Disease Control and Prevention: FootballExhibition.com.br American Sexual Health Association: www.ashastd.org Office on [employer] Health: http://hoffman.com/ Contact a doctor if: Your  symptoms do not get better, even after you are treated. You have more discharge or pain when you pee. You have a fever or chills. You have pain in your belly (abdomen) or in the area between your hips. You have pain with sex. You bleed from your vagina between menstrual periods. Summary This infection can happen when too many germs (bacteria) grow in the vagina. This infection can make it easier to get infections from sex (STIs). Treating this can lower that chance. Get treated if you are pregnant. This infection can cause babies to be born early. Do not stop taking or using your antibiotic medicine, even if you start to feel better. This information is not intended to replace advice given to you by your health care provider. Make sure you discuss any questions you have with your health care provider. Document Revised: 12/17/2019 Document Reviewed: 12/17/2019 Elsevier Patient Education  2022 [employer].    If you have been instructed to have an in-person evaluation today at a local Urgent Care facility, please use the link below. It will take you to a list of all of our available [facility], including address, phone number and hours of operation. Please do not delay care.  [facility]  If you or a family member do not have a primary care provider, use the link below to schedule a visit and establish care. When you choose a [facility] primary care physician or advanced practice provider, you gain a long-term partner in health. Find a Primary Care Provider  Learn more about [facility] in-office and virtual care options:  - Get Care Now

## 2021-08-21 ENCOUNTER — Telehealth: Payer: Medicaid Other | Admitting: Physician Assistant

## 2021-08-21 DIAGNOSIS — R112 Nausea with vomiting, unspecified: Secondary | ICD-10-CM | POA: Diagnosis not present

## 2021-08-21 DIAGNOSIS — R5383 Other fatigue: Secondary | ICD-10-CM | POA: Diagnosis not present

## 2021-08-21 MED ORDER — ONDANSETRON HCL 4 MG PO TABS: 4.0000 mg | ORAL_TABLET | Freq: Three times a day (TID) | ORAL | 0 refills | Status: DC | PRN

## 2021-08-21 NOTE — Progress Notes (Signed)
E-Visit for Nausea and Vomiting   We are sorry that you are not feeling well. Here is how we plan to help!  Based on what you have shared with me it looks like you have a Virus that is irritating your GI tract.  Vomiting is the forceful emptying of a portion of the stomach's content through the mouth.  Although nausea and vomiting can make you feel miserable, it's important to remember that these are not diseases, but rather symptoms of an underlying illness.  When we treat short term symptoms, we always caution that any symptoms that persist should be fully evaluated in a medical office.  I have prescribed a medication that will help alleviate your symptoms and allow you to stay hydrated:  Zofran 4 mg 1 tablet every 8 hours as needed for nausea and vomiting  HOME CARE: Drink clear liquids.  This is very important! Dehydration (the lack of fluid) can lead to a serious complication.  Start off with 1 tablespoon every 5 minutes for 8 hours. You may begin eating bland foods after 8 hours without vomiting.  Start with saltine crackers, white bread, rice, mashed potatoes, applesauce. After 48 hours on a bland diet, you may resume a normal diet. Try to go to sleep.  Sleep often empties the stomach and relieves the need to vomit.  GET HELP RIGHT AWAY IF:  Your symptoms do not improve or worsen within 2 days after treatment. You have a fever for over 3 days. You cannot keep down fluids after trying the medication.  MAKE SURE YOU:  Understand these instructions. Will watch your condition. Will get help right away if you are not doing well or get worse.    Thank you for choosing an e-visit.  Your e-visit answers were reviewed by a board certified advanced clinical practitioner to complete your personal care plan. Depending upon the condition, your plan could have included both over the counter or prescription medications.  Please review your pharmacy choice. Make sure the pharmacy is open so  you can pick up prescription now. If there is a problem, you may contact your provider through MyChart messaging and have the prescription routed to another pharmacy.  Your safety is important to us. If you have drug allergies check your prescription carefully.   For the next 24 hours you can use MyChart to ask questions about today's visit, request a non-urgent call back, or ask for a work or school excuse. You will get an email in the next two days asking about your experience. I hope that your e-visit has been valuable and will speed your recovery.  I provided 5 minutes of non face-to-face time during this encounter for chart review and documentation.   

## 2021-08-25 ENCOUNTER — Other Ambulatory Visit: Payer: Self-pay | Admitting: Advanced Practice Midwife

## 2021-10-28 ENCOUNTER — Telehealth: Payer: Medicaid Other | Admitting: Physician Assistant

## 2021-10-28 ENCOUNTER — Encounter: Payer: Self-pay | Admitting: Physician Assistant

## 2021-10-28 DIAGNOSIS — N3000 Acute cystitis without hematuria: Secondary | ICD-10-CM | POA: Diagnosis not present

## 2021-10-28 MED ORDER — NITROFURANTOIN MONOHYD MACRO 100 MG PO CAPS: 100.0000 mg | ORAL_CAPSULE | Freq: Two times a day (BID) | ORAL | 0 refills | Status: AC

## 2021-10-28 NOTE — Progress Notes (Signed)
?Virtual Visit [name]  ? ?Meagan Pellegriniatherine P [name], you are scheduled for a virtual visit with a [facility] provider today.   ?  ?Just as with appointments in the office, your [name] must be obtained to participate.  Your [name] will be active for this visit and any virtual visit you may have with one of our providers in the next 365 days.   ?  ?If you have a MyChart account, a copy of this [name] can be sent to you electronically.  All virtual visits are billed to your insurance company just like a traditional visit in the office.   ? ?As this is a virtual visit, video technology does not allow for your provider to perform a traditional examination.  This may limit your provider's ability to fully assess your condition.  If your provider identifies any concerns that need to be evaluated in person or the need to arrange testing (such as labs, EKG, etc.), we will make arrangements to do so.   ?  ?Although advances in technology are sophisticated, we cannot ensure that it will always work on either your end or our end.  If the connection with a video visit is poor, the visit may have to be switched to a telephone visit.  With either a video or telephone visit, we are not always able to ensure that we have a secure connection.    ? ?Also, by engaging in this virtual visit, you [name] to the provision of healthcare. Additionally, you authorize for your insurance to be billed (if applicable) for the services provided during this visit.  ? ?I need to obtain your verbal [name] now.   Are you willing to proceed with your visit today?  ?  ?Meagan [name] on 10/28/2021 for a virtual visit (video or telephone). ?  ?Roney Jaffeari S Mayers, PA-C  ? ?Date: 10/28/2021 3:27 PM ? ? ?Virtual Visit via Video Note  ? ?I, Kasandra KnudsenCari S Mayers, connected with  Meagan [name]  ([id number], 10/29/1990) on 10/28/21 at  3:15 PM EDT by a video-enabled telemedicine application and verified that I am speaking with the  correct person using two identifiers. ? ?Due to technical difficulties on patient's side, did transfer to telephone visit with patient's permission. ? ?Location: ?Patient: Virtual Visit Location Patient: Home ?Provider: Virtual Visit Location Provider: Home Office ?  ?I discussed the limitations of evaluation and management by telemedicine and the availability of in person appointments. The patient expressed understanding and agreed to proceed.   ? ?History of Present Illness: ?Meagan [name] is a 31 y.o. who identifies as a female who was assigned female at birth, and is being seen today for UTI symptoms. ? ?States that she has been having dysuria, incomplete bladder emptying, increased frequency, occasional light pink on tissue when wiping, "bladder feels crampy" for the past week.  Denies back pain, nausea, vomiting, fever, tachycardia, vaginal discharge. ? ?States that she has not tried anything for relief.  States that she has had urinary tract infections in the past and this feels similar.  No recent antibiotic use. ? ?Problems:  ?Patient Active Problem List  ? Diagnosis Date Noted  ? Normal labor 08/10/2020  ? Unwanted fertility 07/07/2020  ? History of vaginal delivery following previous cesarean delivery 02/10/2020  ? Short interval between pregnancies affecting pregnancy in first trimester, antepartum 12/23/2019  ? Rh negative state in antepartum period 01/07/2019  ? Previous cesarean section 03/10/2018  ? 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  ?  Hx of preeclampsia 11/16/2015  ? 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 Smoker 12/16/2013  ? Anxiety 06/17/2013  ?  ?Allergies:  ?Allergies  ?Allergen Reactions  ? Penicillins Rash and Other (See Comments)  ?  Unknown:  Childhood reaction.  ?Has patient had a PCN reaction causing immediate rash, facial/tongue/throat swelling, SOB or lightheadedness with hypotension: Unknown ?Has patient had a PCN reaction causing severe rash involving mucus membranes  or skin necrosis: Unknown ?Has patient had a PCN reaction that required hospitalization No ?Has patient had a PCN reaction occurring within the last 10 years: No ?If all of the above answers are "NO", then may pro  ? Sulfamethoxazole-Trimethoprim Hives  ? ?Medications:  ?Current Outpatient Medications:  ?  nitrofurantoin, macrocrystal-monohydrate, (MACROBID) 100 MG capsule, Take 1 capsule (100 mg total) by mouth 2 (two) times daily for 5 days., Disp: 10 capsule, Rfl: 0 ?  ALPRAZolam (XANAX) 0.5 MG tablet, 3 times daily as needed for anxiety and/or sleep, Disp: 90 tablet, Rfl: 3 ?  butalbital-acetaminophen-caffeine (FIORICET) 50-325-40 MG tablet, TAKE (1) TABLET BY MOUTH EVERY 4 HOURS AS NEEDED FOR HEADACHE OR MIGRAINE, Disp: 20 tablet, Rfl: 0 ?  cyclobenzaprine (FLEXERIL) 5 MG tablet, Take 1 tablet (5 mg total) by mouth at bedtime., Disp: 30 tablet, Rfl: 0 ?  ibuprofen (ADVIL) 600 MG tablet, Take 1 tablet (600 mg total) by mouth every 6 (six) hours., Disp: 30 tablet, Rfl: 0 ?  medroxyPROGESTERone Acetate 150 MG/ML SUSY, INJECT 1 VIAL INTRAMUSCULARLY EVERY 3 MONTHS IN OFFICE., Disp: 1 mL, Rfl: 3 ?  metroNIDAZOLE (METROGEL VAGINAL) 0.75 % vaginal gel, Nightly x 5 nights, Disp: 70 g, Rfl: 0 ?  norethindrone (MICRONOR) 0.35 MG tablet, TAKE 1 TABLET BY MOUTH ONCE A DAY., Disp: 28 tablet, Rfl: 11 ?  nortriptyline (PAMELOR) 25 MG capsule, Take 1 capsule (25 mg total) by mouth at bedtime., Disp: 90 capsule, Rfl: 0 ?  ondansetron (ZOFRAN) 4 MG tablet, Take 1 tablet (4 mg total) by mouth every 8 (eight) hours as needed for nausea or vomiting., Disp: 20 tablet, Rfl: 0 ?  polyethylene glycol powder (GLYCOLAX/MIRALAX) 17 GM/SCOOP powder, Take 17 g by mouth 2 (two) times daily as needed., Disp: 3350 g, Rfl: 1 ?  Prenatal Vit-Fe Fumarate-FA (PREPLUS) 27-1 MG TABS, TAKE ONE TABLET BY MOUTH AT 12 NOON. (Patient taking differently: Take 1 tablet by mouth at bedtime.), Disp: 30 tablet, Rfl: prn ? ?Observations/Objective: ?Patient is  well-developed, well-nourished in no acute distress.  ?Resting comfortably  at home.  ?Head is normocephalic, atraumatic.  ?No labored breathing.  ?Speech is clear and coherent with logical content.  ?Patient is alert and oriented at baseline.  ? ? ?Assessment and Plan: ?1. Acute cystitis without hematuria ?- nitrofurantoin, macrocrystal-monohydrate, (MACROBID) 100 MG capsule; Take 1 capsule (100 mg total) by mouth 2 (two) times daily for 5 days.  Dispense: 10 capsule; Refill: 0 ? ?Patient education given on supportive care, red flags given for prompt reevaluation ? ?Follow Up Instructions: ?I discussed the assessment and treatment plan with the patient. The patient was provided an opportunity to ask questions and all were answered. The patient agreed with the plan and demonstrated an understanding of the instructions.  A copy of instructions were sent to the patient via MyChart unless otherwise noted below.  ? ? ? ?The patient was advised to call back or seek an in-person evaluation if the symptoms worsen or if the condition fails to improve as anticipated. ? ?Time:  ?I spent 12 minutes with the  patient via telehealth technology discussing the above problems/concerns.   ? ?Quinette Hentges S Mayers, PA-C ? ?

## 2021-10-28 NOTE — Patient Instructions (Signed)
Meagan Pellegriniatherine P Keady, thank you for joining Roney Jaffeari S Mayers, PA-C for today's virtual visit.  While this provider is not your primary care provider (PCP), if your PCP is located in our provider database this encounter information will be shared with them immediately following your visit. ? ?Consent: ?(Patient) Meagan PellegriniCatherine P [name] provided verbal consent for this virtual visit at the beginning of the encounter. ? ?Current Medications: ? ?Current Outpatient Medications:  ?  nitrofurantoin, macrocrystal-monohydrate, (MACROBID) 100 MG capsule, Take 1 capsule (100 mg total) by mouth 2 (two) times daily for 5 days., Disp: 10 capsule, Rfl: 0 ?  ALPRAZolam (XANAX) 0.5 MG tablet, 3 times daily as needed for anxiety and/or sleep, Disp: 90 tablet, Rfl: 3 ?  butalbital-acetaminophen-caffeine (FIORICET) 50-325-40 MG tablet, TAKE (1) TABLET BY MOUTH EVERY 4 HOURS AS NEEDED FOR HEADACHE OR MIGRAINE, Disp: 20 tablet, Rfl: 0 ?  cyclobenzaprine (FLEXERIL) 5 MG tablet, Take 1 tablet (5 mg total) by mouth at bedtime., Disp: 30 tablet, Rfl: 0 ?  ibuprofen (ADVIL) 600 MG tablet, Take 1 tablet (600 mg total) by mouth every 6 (six) hours., Disp: 30 tablet, Rfl: 0 ?  medroxyPROGESTERone Acetate 150 MG/ML SUSY, INJECT 1 VIAL INTRAMUSCULARLY EVERY 3 MONTHS IN OFFICE., Disp: 1 mL, Rfl: 3 ?  metroNIDAZOLE (METROGEL VAGINAL) 0.75 % vaginal gel, Nightly x 5 nights, Disp: 70 g, Rfl: 0 ?  norethindrone (MICRONOR) 0.35 MG tablet, TAKE 1 TABLET BY MOUTH ONCE A DAY., Disp: 28 tablet, Rfl: 11 ?  nortriptyline (PAMELOR) 25 MG capsule, Take 1 capsule (25 mg total) by mouth at bedtime., Disp: 90 capsule, Rfl: 0 ?  ondansetron (ZOFRAN) 4 MG tablet, Take 1 tablet (4 mg total) by mouth every 8 (eight) hours as needed for nausea or vomiting., Disp: 20 tablet, Rfl: 0 ?  polyethylene glycol powder (GLYCOLAX/MIRALAX) 17 GM/SCOOP powder, Take 17 g by mouth 2 (two) times daily as needed., Disp: 3350 g, Rfl: 1 ?  Prenatal Vit-Fe Fumarate-FA (PREPLUS) 27-1 MG TABS,  TAKE ONE TABLET BY MOUTH AT 12 NOON. (Patient taking differently: Take 1 tablet by mouth at bedtime.), Disp: 30 tablet, Rfl: prn  ? ?Medications ordered in this encounter:  ?Meds ordered this encounter  ?Medications  ? nitrofurantoin, macrocrystal-monohydrate, (MACROBID) 100 MG capsule  ?  Sig: Take 1 capsule (100 mg total) by mouth 2 (two) times daily for 5 days.  ?  Dispense:  10 capsule  ?  Refill:  0  ?  Order Specific Question:   Supervising Provider  ?  Answer:   Eber HongMILLER, BRIAN [[id number]]  ?  ? ?*If you need refills on other medications prior to your next appointment, please contact your pharmacy* ? ?Follow-Up: ?Call back or seek an in-person evaluation if the symptoms worsen or if the condition fails to improve as anticipated. ? ?Other Instructions ?Urinary Tract Infection, Adult ? ?A urinary tract infection (UTI) is an infection of any part of the urinary tract. The urinary tract includes the kidneys, ureters, bladder, and urethra. These organs make, store, and get rid of urine in the body. ?An upper UTI affects the ureters and kidneys. A lower UTI affects the bladder and urethra. ?What are the causes? ?Most urinary tract infections are caused by bacteria in your genital area around your urethra, where urine leaves your body. These bacteria grow and cause inflammation of your urinary tract. ?What increases the risk? ?You are more likely to develop this condition if: ?You have a urinary catheter that stays in place. ?You are not able  to control when you urinate or have a bowel movement (incontinence). ?You are female and you: ?Use a spermicide or diaphragm for birth control. ?Have low estrogen levels. ?Are pregnant. ?You have certain genes that increase your risk. ?You are sexually active. ?You take antibiotic medicines. ?You have a condition that causes your flow of urine to slow down, such as: ?An enlarged prostate, if you are female. ?Blockage in your urethra. ?A kidney stone. ?A nerve condition that affects your  bladder control (neurogenic bladder). ?Not getting enough to drink, or not urinating often. ?You have certain medical conditions, such as: ?Diabetes. ?A weak disease-fighting system (immunesystem). ?Sickle cell disease. ?Gout. ?Spinal cord injury. ?What are the signs or symptoms? ?Symptoms of this condition include: ?Needing to urinate right away (urgency). ?Frequent urination. This may include small amounts of urine each time you urinate. ?Pain or burning with urination. ?Blood in the urine. ?Urine that smells bad or unusual. ?Trouble urinating. ?Cloudy urine. ?Vaginal discharge, if you are female. ?Pain in the abdomen or the lower back. ?You may also have: ?Vomiting or a decreased appetite. ?Confusion. ?Irritability or tiredness. ?A fever or chills. ?Diarrhea. ?The first symptom in older adults may be confusion. In some cases, they may not have any symptoms until the infection has worsened. ?How is this diagnosed? ?This condition is diagnosed based on your medical history and a physical exam. You may also have other tests, including: ?Urine tests. ?Blood tests. ?Tests for STIs (sexually transmitted infections). ?If you have had more than one UTI, a cystoscopy or imaging studies may be done to determine the cause of the infections. ?How is this treated? ?Treatment for this condition includes: ?Antibiotic medicine. ?Over-the-counter medicines to treat discomfort. ?Drinking enough water to stay hydrated. ?If you have frequent infections or have other conditions such as a kidney stone, you may need to see a health care provider who specializes in the urinary tract (urologist). ?In rare cases, urinary tract infections can cause sepsis. Sepsis is a life-threatening condition that occurs when the body responds to an infection. Sepsis is treated in the hospital with IV antibiotics, fluids, and other medicines. ?Follow these instructions at home: ? ?Medicines ?Take over-the-counter and prescription medicines only as told  by your health care provider. ?If you were prescribed an antibiotic medicine, take it as told by your health care provider. Do not stop using the antibiotic even if you start to feel better. ?General instructions ?Make sure you: ?Empty your bladder often and completely. Do not hold urine for long periods of time. ?Empty your bladder after sex. ?Wipe from front to back after urinating or having a bowel movement if you are female. Use each tissue only one time when you wipe. ?Drink enough fluid to keep your urine pale yellow. ?Keep all follow-up visits. This is important. ?Contact a health care provider if: ?Your symptoms do not get better after 1-2 days. ?Your symptoms go away and then return. ?Get help right away if: ?You have severe pain in your back or your lower abdomen. ?You have a fever or chills. ?You have nausea or vomiting. ?Summary ?A urinary tract infection (UTI) is an infection of any part of the urinary tract, which includes the kidneys, ureters, bladder, and urethra. ?Most urinary tract infections are caused by bacteria in your genital area. ?Treatment for this condition often includes antibiotic medicines. ?If you were prescribed an antibiotic medicine, take it as told by your health care provider. Do not stop using the antibiotic even if you start to  feel better. ?Keep all follow-up visits. This is important. ?This information is not intended to replace advice given to you by your health care provider. Make sure you discuss any questions you have with your health care provider. ?Document Revised: 01/29/2020 Document Reviewed: 01/29/2020 ?Elsevier Patient Education ? 2023 Elsevier Inc. ? ? ? ?If you have been instructed to have an in-person evaluation today at a local Urgent Care facility, please use the link below. It will take you to a list of all of our available [facility], including address, phone number and hours of operation. Please do not delay care.  ?[facility] ? ?If you or a family member do not have a primary care provider, use the link below to schedule a visit and establish care. When you choose a  primary care physician or advanced practice pro

## 2021-12-27 ENCOUNTER — Ambulatory Visit (INDEPENDENT_AMBULATORY_CARE_PROVIDER_SITE_OTHER): Payer: Medicaid Other | Admitting: Family Medicine

## 2021-12-27 ENCOUNTER — Encounter: Payer: Self-pay | Admitting: Family Medicine

## 2021-12-27 VITALS — BP 116/77 | HR 72 | Ht 63.0 in | Wt 135.8 lb

## 2021-12-27 DIAGNOSIS — F419 Anxiety disorder, unspecified: Secondary | ICD-10-CM | POA: Diagnosis not present

## 2021-12-27 DIAGNOSIS — R7301 Impaired fasting glucose: Secondary | ICD-10-CM

## 2021-12-27 DIAGNOSIS — Z0001 Encounter for general adult medical examination with abnormal findings: Secondary | ICD-10-CM

## 2021-12-27 DIAGNOSIS — F32A Depression, unspecified: Secondary | ICD-10-CM

## 2021-12-27 DIAGNOSIS — E559 Vitamin D deficiency, unspecified: Secondary | ICD-10-CM

## 2021-12-27 DIAGNOSIS — F17200 Nicotine dependence, unspecified, uncomplicated: Secondary | ICD-10-CM

## 2021-12-27 NOTE — Assessment & Plan Note (Signed)
-  Will like to talk to a therapist -Referral placed

## 2021-12-27 NOTE — Assessment & Plan Note (Signed)
Smokes about 1 pack/day  Asked about quitting: confirms that she currently smokes cigarettes Advise to quit smoking: Educated about QUITTING to reduce the risk of cancer, cardio and cerebrovascular disease. Assess willingness: Unwilling to quit at this time, but is working on cutting back. Assist with counseling and pharmacotherapy: Counseled for 5 minutes and literature provided. Arrange for follow up: follow up in 3 months and continue to offer help.  
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
## 2021-12-27 NOTE — Progress Notes (Signed)
new

## 2021-12-27 NOTE — Patient Instructions (Addendum)
I appreciate the opportunity to provide care to you today!    Follow up:  3 months  Labs: please stop by the lab today to get your blood drawn (CBC, CMP, TSH, Lipid profile, HgA1c, Vit D)   Referrals today-  psychiatrist   Please continue to a heart-healthy diet and increase your physical activities. Try to exercise for at least three times a week.      It was a pleasure to see you and I look forward to continuing to work together on your health and well-being. Please do not hesitate to call the office if you need care or have questions about your care.   Have a wonderful day and week. With Gratitude, Gilmore Laroche MSN, FNP-BC

## 2022-03-22 ENCOUNTER — Ambulatory Visit: Payer: Medicaid Other | Admitting: Advanced Practice Midwife

## 2022-04-18 ENCOUNTER — Telehealth: Payer: Medicaid Other

## 2022-06-26 ENCOUNTER — Other Ambulatory Visit: Payer: Self-pay | Admitting: Advanced Practice Midwife

## 2022-07-01 ENCOUNTER — Telehealth: Payer: Medicaid Other | Admitting: Family

## 2022-07-01 DIAGNOSIS — J019 Acute sinusitis, unspecified: Secondary | ICD-10-CM | POA: Diagnosis not present

## 2022-07-01 MED ORDER — DOXYCYCLINE HYCLATE 100 MG PO TABS: 100.0000 mg | ORAL_TABLET | Freq: Two times a day (BID) | ORAL | 0 refills | Status: DC

## 2022-07-01 NOTE — Progress Notes (Signed)

## 2022-07-02 ENCOUNTER — Telehealth: Payer: Medicaid Other

## 2022-08-07 ENCOUNTER — Other Ambulatory Visit: Payer: Self-pay

## 2022-08-07 ENCOUNTER — Emergency Department (HOSPITAL_COMMUNITY)
Admission: EM | Admit: 2022-08-07 | Discharge: 2022-08-07 | Disposition: A | Discharge status: Home / Self Care | Payer: Commercial Managed Care - HMO | Attending: Emergency Medicine | Admitting: Emergency Medicine

## 2022-08-07 DIAGNOSIS — T40601A Poisoning by unspecified narcotics, accidental (unintentional), initial encounter: Secondary | ICD-10-CM | POA: Diagnosis present

## 2022-08-07 MED ORDER — NALOXONE HCL 0.4 MG/ML IJ SOLN
0.2000 mg | Freq: Once | INTRAMUSCULAR | Status: AC
Start: 2022-08-07 — End: 2022-08-07
  Administered 2022-08-07: 0.2 mg via INTRAVENOUS
  Filled 2022-08-07: qty 1

## 2022-08-07 MED ORDER — NALOXONE HCL 0.4 MG/ML IJ SOLN: 0.4000 mg | INTRAMUSCULAR | 1 refills | Status: AC | PRN

## 2022-08-07 NOTE — ED Provider Notes (Signed)
  Physical Exam  BP 102/64   Pulse 66   Temp 98.5 F (36.9 C)   Resp 16   Ht 5\' 3"  (1.6 m)   Wt 61 kg   LMP 08/03/2022 (Approximate)   SpO2 100%   BMI 23.82 kg/m   Physical Exam  Procedures  Procedures  ED Course / MDM    Medical Decision Making Risk Prescription drug management.   Pt w/ hx of substance use disorder. She admits to cocaine use and also hydrocodone use. She was found lethargic, hypoxic upon arrival - received narcan in the ER. Patient has overall improved, but currently is on oxygen. She is going to be weaned off of O2 now. If patient continues to maintain O2 sats, mental status remains stable then she can be discharged.  8:24 AM I reassessed the patient at 8:05 AM.  Patient wants to go home.  She still sleepy.  Her friend is at the bedside.  He states that patient is able to carry a conversation.  He is comfortable taking care of her.  They are both comfortable going home at 815.  Nursing staff informing that patient has maintained O2 sats, and they want to go home.  They are stable for discharge, with return precautions.    Varney Biles, MD 08/07/22 [phone]

## 2022-08-07 NOTE — ED Provider Notes (Signed)
[facility] Provider Note   CSN: [id number] Arrival date & time: 08/07/22  0445     History {Add pertinent medical, surgical, social history, OB history to HPI:1} Chief Complaint  Patient presents with   Drug Overdose    Meagan [name] is a 32 y.o. female.  Here with suspected drug overdose. Patient is intoxicated and somewhat hard to understand but she states she did cocaine earlier today. Then tonight she thought she was taking a hydrocodone and subsequently felt really bad and called EMS. Concerned she might have had fentanyl but has never tried it before so not sure.    Drug Overdose       Home Medications Prior to Admission medications   Medication Sig Start Date End Date Taking? Authorizing Provider  ALPRAZolam Duanne Moron) 0.5 MG tablet 3 times daily as needed for anxiety and/or sleep 10/03/20   Florian Buff, MD  butalbital-acetaminophen-caffeine (FIORICET) 50-325-40 MG tablet TAKE (1) TABLET BY MOUTH EVERY 4 HOURS AS NEEDED FOR HEADACHE OR MIGRAINE 01/14/21   Florian Buff, MD  cyclobenzaprine (FLEXERIL) 5 MG tablet Take 1 tablet (5 mg total) by mouth at bedtime. Patient not taking: Reported on 12/27/2021 08/13/20   Dorena Dew, FNP  doxycycline (VIBRA-TABS) 100 MG tablet Take 1 tablet (100 mg total) by mouth 2 (two) times daily. 07/01/22   Evelina Dun A, FNP  ibuprofen (ADVIL) 600 MG tablet Take 1 tablet (600 mg total) by mouth every 6 (six) hours. 08/12/20   Cresenzo-Dishmon, Joaquim Lai, CNM  medroxyPROGESTERone Acetate 150 MG/ML SUSY INJECT 1 VIAL INTRAMUSCULARLY EVERY 3 MONTHS IN OFFICE. Patient not taking: Reported on 12/27/2021 08/29/21   Cresenzo-Dishmon, Joaquim Lai, CNM  metroNIDAZOLE (METROGEL VAGINAL) 0.75 % vaginal gel Nightly x 5 nights Patient not taking: Reported on 12/27/2021 05/17/21   Florian Buff, MD  norethindrone (MICRONOR) 0.35 MG tablet TAKE 1 TABLET BY MOUTH ONCE A DAY. 06/28/22   Cresenzo-Dishmon,  Joaquim Lai, CNM  nortriptyline (PAMELOR) 25 MG capsule Take 1 capsule (25 mg total) by mouth at bedtime. Patient not taking: Reported on 12/27/2021 05/02/20   Debbora Presto, NP  ondansetron (ZOFRAN) 4 MG tablet Take 1 tablet (4 mg total) by mouth every 8 (eight) hours as needed for nausea or vomiting. 08/21/21   Mar Daring, PA-C  polyethylene glycol powder (GLYCOLAX/MIRALAX) 17 GM/SCOOP powder Take 17 g by mouth 2 (two) times daily as needed. Patient not taking: Reported on 12/27/2021 02/26/21   Sharion Balloon, FNP  Prenatal Vit-Fe Fumarate-FA (PREPLUS) 27-1 MG TABS TAKE ONE TABLET BY MOUTH AT 12 NOON. Patient not taking: Reported on 12/27/2021 12/22/19   Estill Dooms, NP      Allergies    Penicillins and Sulfamethoxazole-trimethoprim    Review of Systems   Review of Systems  Physical Exam Updated Vital Signs Ht 5\' 3"  (1.6 m)   Wt 61 kg   LMP 08/03/2022 (Approximate)   BMI 23.82 kg/m  Physical Exam Vitals and nursing note reviewed.  Constitutional:      Appearance: She is well-developed.     Comments: tearful  HENT:     Head: Normocephalic and atraumatic.     Mouth/Throat:     Mouth: Mucous membranes are moist.  Eyes:     Pupils: Pupils are equal, round, and reactive to light.  Cardiovascular:     Rate and Rhythm: Normal rate and regular rhythm.  Pulmonary:     Effort: No respiratory distress.  Breath sounds: No stridor.  Abdominal:     General: Abdomen is flat. There is no distension.  Musculoskeletal:        General: Normal range of motion.     Cervical back: Normal range of motion.  Skin:    General: Skin is warm and dry.  Neurological:     General: No focal deficit present.     Mental Status: She is alert.     ED Results / Procedures / Treatments   Labs (all labs ordered are listed, but only abnormal results are displayed) Labs Reviewed - No data to display  EKG None  Radiology No results found.  Procedures Procedures  {Document cardiac  monitor, telemetry assessment procedure when appropriate:1}  Medications Ordered in ED Medications - No data to display  ED Course/ Medical Decision Making/ A&P   {   Click here for ABCD2, HEART and other calculatorsREFRESH Note before signing :1}                          Medical Decision Making Risk Prescription drug management.  Ecg and monitor.   Gets bradypneic and hypoxic when she goes to sleep even on O2, narcan given with good response. Will continue to monitor.  ***  {Document critical care time when appropriate:1} {Document review of labs and clinical decision tools ie heart score, Chads2Vasc2 etc:1}  {Document your independent review of radiology images, and any outside records:1} {Document your discussion with family members, caretakers, and with consultants:1} {Document social determinants of health affecting pt's care:1} {Document your decision making why or why not admission, treatments were needed:1} Final Clinical Impression(s) / ED Diagnoses Final diagnoses:  None    Rx / DC Orders ED Discharge Orders     None

## 2022-08-07 NOTE — ED Triage Notes (Addendum)
Pt arrives via [facility]. Per EMS pt "snorted some cocaine". Now reports "she doesn't feel good and thinks someone gave her bad drugs" Pt is oriented but lethargic in triage.

## 2022-08-08 ENCOUNTER — Telehealth: Payer: Self-pay

## 2022-08-08 NOTE — Telephone Encounter (Signed)
Transition Care Management Unsuccessful Follow-up Telephone Call  Date of discharge and from where:  [facility] 08/07/2022  Attempts:  1st Attempt  Reason for unsuccessful TCM follow-up call:  Left voice message Juanda Crumble, [place] Direct Dial [phone]

## 2022-08-08 NOTE — Telephone Encounter (Signed)
Transition Care Management Unsuccessful Follow-up Telephone Call  Date of discharge and from where:  [facility] 08/07/2022  Attempts:  2nd Attempt  Reason for unsuccessful TCM follow-up call:  Left voice message Juanda Crumble, [place] Direct Dial [phone]

## 2022-09-07 ENCOUNTER — Emergency Department (HOSPITAL_COMMUNITY)
Admission: EM | Admit: 2022-09-07 | Discharge: 2022-09-08 | Disposition: A | Discharge status: Other Acute Inpatient Hospital | Payer: Commercial Managed Care - HMO | Attending: Emergency Medicine | Admitting: Emergency Medicine

## 2022-09-07 ENCOUNTER — Other Ambulatory Visit: Payer: Self-pay

## 2022-09-07 DIAGNOSIS — Z20822 Contact with and (suspected) exposure to covid-19: Secondary | ICD-10-CM | POA: Diagnosis not present

## 2022-09-07 DIAGNOSIS — F1510 Other stimulant abuse, uncomplicated: Secondary | ICD-10-CM | POA: Diagnosis not present

## 2022-09-07 DIAGNOSIS — F1410 Cocaine abuse, uncomplicated: Secondary | ICD-10-CM | POA: Insufficient documentation

## 2022-09-07 DIAGNOSIS — F1210 Cannabis abuse, uncomplicated: Secondary | ICD-10-CM | POA: Insufficient documentation

## 2022-09-07 DIAGNOSIS — F1910 Other psychoactive substance abuse, uncomplicated: Secondary | ICD-10-CM | POA: Diagnosis not present

## 2022-09-07 DIAGNOSIS — Y900 Blood alcohol level of less than 20 mg/100 ml: Secondary | ICD-10-CM | POA: Diagnosis not present

## 2022-09-07 DIAGNOSIS — F17200 Nicotine dependence, unspecified, uncomplicated: Secondary | ICD-10-CM | POA: Insufficient documentation

## 2022-09-07 LAB — CBC
HCT: 43.9 % (ref 36.0–46.0)
Hemoglobin: 14.4 g/dL (ref 12.0–15.0)
MCH: 30.3 pg (ref 26.0–34.0)
MCHC: 32.8 g/dL (ref 30.0–36.0)
MCV: 92.4 fL (ref 80.0–100.0)
Platelets: 311 10*3/uL (ref 150–400)
RBC: 4.75 MIL/uL (ref 3.87–5.11)
RDW: 12.8 % (ref 11.5–15.5)
WBC: 6.6 10*3/uL (ref 4.0–10.5)
nRBC: 0 % (ref 0.0–0.2)

## 2022-09-07 LAB — COMPREHENSIVE METABOLIC PANEL
ALT: 11 U/L (ref 0–44)
AST: 7 U/L — ABNORMAL LOW (ref 15–41)
Albumin: 3.3 g/dL — ABNORMAL LOW (ref 3.5–5.0)
Alkaline Phosphatase: 64 U/L (ref 38–126)
Anion gap: 5 (ref 5–15)
BUN: 8 mg/dL (ref 6–20)
CO2: 27 mmol/L (ref 22–32)
Calcium: 8.1 mg/dL — ABNORMAL LOW (ref 8.9–10.3)
Chloride: 101 mmol/L (ref 98–111)
Creatinine, Ser: 0.67 mg/dL (ref 0.44–1.00)
GFR, Estimated: >60 mL/min (ref >60)
Glucose, Bld: 97 mg/dL (ref 70–99)
Potassium: 3.6 mmol/L (ref 3.5–5.1)
Sodium: 133 mmol/L — ABNORMAL LOW (ref 135–145)
Total Bilirubin: 0.3 mg/dL (ref 0.3–1.2)
Total Protein: 6.8 g/dL (ref 6.5–8.1)

## 2022-09-07 LAB — RAPID URINE DRUG SCREEN, HOSP PERFORMED
Amphetamines: POSITIVE — AB
Barbiturates: NOT DETECTED
Benzodiazepines: NOT DETECTED
Cocaine: POSITIVE — AB
Opiates: NOT DETECTED
Tetrahydrocannabinol: POSITIVE — AB

## 2022-09-07 LAB — ACETAMINOPHEN LEVEL: Acetaminophen (Tylenol), Serum: <10 ug/mL — ABNORMAL LOW (ref 10–30)

## 2022-09-07 LAB — ETHANOL: Alcohol, Ethyl (B): <10 mg/dL (ref <10)

## 2022-09-07 LAB — SALICYLATE LEVEL: Salicylate Lvl: <7 mg/dL — ABNORMAL LOW (ref 7.0–30.0)

## 2022-09-07 LAB — POC URINE PREG, ED: Preg Test, Ur: NEGATIVE

## 2022-09-07 MED ORDER — ALPRAZOLAM 0.5 MG PO TABS
0.5000 mg | ORAL_TABLET | Freq: Three times a day (TID) | ORAL | Status: DC
  Administered 2022-09-07 – 2022-09-08 (×2): 0.5 mg via ORAL
  Filled 2022-09-07 (×2): qty 1

## 2022-09-07 NOTE — ED Notes (Signed)
Pt asking for her Clonazepam, provider notified.

## 2022-09-07 NOTE — ED Provider Notes (Signed)
[facility] Provider Note   CSN: [id number] Arrival date & time: 09/07/22  1459     History {Add pertinent medical, surgical, social history, OB history to HPI:1} Chief Complaint  Patient presents with   Psychiatric Evaluation   IVC   HPI Meagan [name] is a 32 y.o. female.  HPI     Home Medications Prior to Admission medications   Medication Sig Start Date End Date Taking? Authorizing Provider  ALPRAZolam Duanne Moron) 0.5 MG tablet 3 times daily as needed for anxiety and/or sleep 10/03/20   Florian Buff, MD  butalbital-acetaminophen-caffeine (FIORICET) 50-325-40 MG tablet TAKE (1) TABLET BY MOUTH EVERY 4 HOURS AS NEEDED FOR HEADACHE OR MIGRAINE 01/14/21   Florian Buff, MD  naloxone [facility]) 0.4 MG/ML injection Place 1 mL (0.4 mg total) into the nose as needed (decreased consciousness or breathing after taking narcotics). 08/07/22   Mesner, Corene Cornea, MD  norethindrone (MICRONOR) 0.35 MG tablet TAKE 1 TABLET BY MOUTH ONCE A DAY. 06/28/22   Cresenzo-Dishmon, Joaquim Lai, CNM  ondansetron (ZOFRAN) 4 MG tablet Take 1 tablet (4 mg total) by mouth every 8 (eight) hours as needed for nausea or vomiting. 08/21/21   Mar Daring, PA-C      Allergies    Penicillins and Sulfamethoxazole-trimethoprim    Review of Systems   Review of Systems  Physical Exam Updated Vital Signs BP 113/76 (BP Location: Right Arm)   Pulse (!) 110   Temp 98 F (36.7 C) (Oral)   Resp 14   Ht '5\' 2"'$  (1.575 m)   Wt 55.8 kg   LMP 08/30/2022 (Exact Date)   SpO2 100%   BMI 22.50 kg/m  Physical Exam  ED Results / Procedures / Treatments   Labs (all labs ordered are listed, but only abnormal results are displayed) Labs Reviewed  COMPREHENSIVE METABOLIC PANEL - Abnormal; Notable for the following components:      Result Value   Sodium 133 (*)    Calcium 8.1 (*)    Albumin 3.3 (*)    AST 7 (*)    All other components within normal limits  SALICYLATE LEVEL  - Abnormal; Notable for the following components:   Salicylate Lvl [id number] (*)    All other components within normal limits  ACETAMINOPHEN LEVEL - Abnormal; Notable for the following components:   Acetaminophen (Tylenol), Serum <10 (*)    All other components within normal limits  ETHANOL  CBC  RAPID URINE DRUG SCREEN, HOSP PERFORMED  POC URINE PREG, ED    EKG None  Radiology No results found.  Procedures Procedures  {Document cardiac monitor, telemetry assessment procedure when appropriate:1}  Medications Ordered in ED Medications - No data to display  ED Course/ Medical Decision Making/ A&P   {   Click here for ABCD2, HEART and other calculatorsREFRESH Note before signing :1}                          Medical Decision Making Amount and/or Complexity of Data Reviewed Labs: ordered.   ***  {Document critical care time when appropriate:1} {Document review of labs and clinical decision tools ie heart score, Chads2Vasc2 etc:1}  {Document your independent review of radiology images, and any outside records:1} {Document your discussion with family members, caretakers, and with consultants:1} {Document social determinants of health affecting pt's care:1} {Document your decision making why or why not admission, treatments were needed:1} Final Clinical Impression(s) / ED Diagnoses  Final diagnoses:  None    Rx / DC Orders ED Discharge Orders     None

## 2022-09-07 NOTE — BH Assessment (Addendum)
Comprehensive Clinical Assessment (CCA) Note  09/07/2022 Meagan [name] [id number]  Disposition: Clinical report given to Meagan Reasoner, NP who recommends continuous assessment, to be reassessed by psychiatry in the morning. Meagan [name] informed of the recommendation.  The patient demonstrates the following risk factors for suicide: Chronic risk factors for suicide include: substance use disorder. Acute risk factors for suicide include: family or marital conflict. Protective factors for this patient include: responsibility to others (children, family). Considering these factors, the overall suicide risk at this point appears to be none. Patient is not appropriate for outpatient follow up.  Meagan [name] is a 32 year-old female who presents involuntarily to [facility] ED via Event organ[facility]. Patient reports a history of anxiety. Patient states she was IVC'd today by her father Meagan [name] [phone], following a verbal argument last night. [place] late grandfather's home. She says her father lives next door and he became upset when she did not return home last night. Patient states her father is overprotective and if things do not go his way, he threatens her as a form of control. Patient denies being suicidal and reports she has never been suicidal. Patient states she has a diagnosis of anxiety, however, denies any recent symptoms. Patient denies HI, auditory or visual hallucinations. Patient initially denied any substance use, however when informed her UDS being positive for marijuana and cocaine, she admitted to using marijuana two days ago. Patient reports she smoked marijuana and was not aware it was laced with cocaine. Patient states she uses marijuana 1x-2x a month. She denies additional substance use. Patient denies access to guns or weapons.  Patient identifies her primary stressor as the relationship with her father. Patient has a fianc of 3  years and 6 children, ages 27 years old. to 30 years old. Patient report she has joint custody with the children's father and has them every other week. 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 Patient denies any history of trauma or current legal problems.   Patient is not receiving any therapy services. Patient states she was prescribed Xanax at age 33 and switched to Esko six months ago, which is prescribed by her PCP. Patient denies any previous inpatient hospitalizations.   Patient is dressed in scrubs, alert and oriented x4 with normal speech. Patient has good eye contact and her thought process is coherent. There is no indication patient is responding to internal stimuli. Patient was cooperative throughout the assessment. Patient states she would like to return home because this is her weekend with the children.   Patient provided permission for her father Meagan [name] to be contacted for collateral. Patient's father reports patient's children were taken by [place] one year ago. He states patient's mental health has declined since that time. Mr. Meagan [name] says patient has recently lost about 30lbs and has been missing visitation with her children. He says patient overdosed on cocaine in February. Per chart review, patient arrived at [facility] ED on 08/07/2022 following the use of cocaine and hydrocodone. Patient was given [place] and oxygen before being released. Mr. Meagan [name] reports last night, he text patient and told patient he did not want anyone using drugs at her home. He says patient text him stating "I give up. I just want to die." Mr. Meagan [name] states she text the same thing to the grandmother of  her children. Mr. Meagan [name] reports [facility] CPS has asked patient to get a job and pass drug test, to get her children back. He says patient has a court date on Tuesday, March 12 for a request  of child support. Mr. Meagan [name] states patient "is a very good Gaffer." He says patient's mother has a history of paranoid schizophrenia and bipolar. He states patient needs mental health assistance, as well as help with substance abuse.   Chief Complaint:  Chief Complaint  Patient presents with   Psychiatric Evaluation   IVC   Visit Diagnosis:  Cocaine abuse Cannabis abuse    CCA Screening, Triage and Referral (STR)  Patient Reported Information How did you hear about [name]? Legal System  What Is the Reason for Your Visit/Call Today? Patient presents to [facility] ED under IVC, which states "father stated she wanted to die." Patient states the accusation is fale and stemming from a verbal disagreement with her fahter. Patient denies current SI, HI, auditory or visual hallucinations.  How Long Has This Been Causing You Problems? <Week  What Do You Feel Would Help You the Most Today? Treatment for Depression or other mood problem; Alcohol or Drug Use Treatment   Have You Recently Had Any Thoughts About Hurting Yourself? No  Are You Planning to Commit Suicide/Harm Yourself At This time? No   Flowsheet Row ED from 09/07/2022 in [facility] Emergency Department at [facility] ED from 08/07/2022 in [facility] ED from 04/11/2021 in [facility] at [place] No Risk No Risk No Risk       Have you Recently Had Thoughts About [place]? No  Are You Planning to Harm Someone at This Time? No  Explanation: N/A   Have You Used Any Alcohol or Drugs in the Past 24 Hours? No  What Did You Use and How Much? N/A   Do You Currently Have a Therapist/Psychiatrist? No  Name of Therapist/Psychiatrist: Name of Therapist/Psychiatrist: N/A   Have You Been Recently Discharged From Any Office Practice or Programs? No  Explanation of Discharge From Practice/Program: N/A     CCA  Screening Triage Referral Assessment Type of Contact: Tele-Assessment  Telemedicine Service Delivery: Telemedicine service delivery: This service was provided via telemedicine using a 2-way, interactive audio and video technology  Is this Initial or Reassessment? Is this Initial or Reassessment?: Initial Assessment  Date Telepsych consult ordered in [facility]:  Date Telepsych consult ordered in [facility]: 09/07/22  Time Telepsych consult ordered in [facility]:  Time Telepsych consult ordered in [facility]: 1603  Location of Assessment: [facility] ED  Provider Location: [facility] Assessment Services   Collateral Involvement: Meagan [name] (father) [phone]   Does Patient Have a [place]? No  Legal Guardian Contact Information: N/A  Copy of Legal Guardianship Form: -- (N/A)  Legal Guardian Notified of Arrival: -- (N/A)  Legal Guardian Notified of Pending Discharge: -- (N/A)  If Minor and Not Living with Parent(s), Who has Custody? N/A  Is CPS involved or ever been involved? Currently (Per patient's father, [place] CPS currently involved.)  Is APS involved or ever been involved? Never   Patient Determined To Be At Risk for Harm To Self or Others Based on Review of Patient Reported Information or Presenting Complaint? No  Method: No Plan (Denies SI/HI)  Availability of Means: No access or NA (Denies SI/HI)  Intent: Vague intent or NA (Denies  SI/HI)  Notification Required: No need or identified person (Denies SI/HI)  Additional Information for Danger to Others Potential: -- (N/A)  Additional Comments for Danger to Others Potential: N/A  Are There Guns or Other Weapons in Your Home? No  Types of Guns/Weapons: N/A  Are These Weapons Safely Secured?                            -- (N/A)  Who Could Verify You Are Able To Have These Secured: N/A  Do You Have any Outstanding Charges, Pending Court Dates, Parole/Probation? Per father, patient has a court date Tuesday, March  12 for child support  Contacted To Inform of Risk of Harm To Self or Others: -- (N/A)    Does Patient Present under Involuntary Commitment? Yes    [place] of Residence: [place]   Patient Currently Receiving the Following Services: Not Receiving Services   Determination of Need: Urgent (48 hours)   Options For Referral: Outpatient Therapy     CCA Biopsychosocial Patient Reported Schizophrenia/Schizoaffective Diagnosis in Past: No   Strengths: Patient is friendly and pleasant   Mental Health Symptoms Depression:   None   Duration of Depressive symptoms:    Mania:   None   Anxiety:    Worrying   Psychosis:   None   Duration of Psychotic symptoms:    Trauma:   None   Obsessions:   None   Compulsions:   None   Inattention:   None   Hyperactivity/Impulsivity:   None   Oppositional/Defiant Behaviors:   None   Emotional Irregularity:   None   Other Mood/Personality Symptoms:   N/A    Mental Status Exam Appearance and self-care  Stature:   Small   Weight:   Average weight   Clothing:   -- (Hospital scrubs)   Grooming:   Normal   Cosmetic use:   None   Posture/gait:   Normal   Motor activity:   Not Remarkable   Sensorium  Attention:   Normal   Concentration:   Normal   Orientation:  No data recorded  Recall/memory:   Normal   Affect and Mood  Affect:   Appropriate   Mood:   Euphoric   Relating  Eye contact:   Normal   Facial expression:   Responsive   Attitude toward examiner:   Cooperative   Thought and Language  Speech flow:  Normal   Thought content:   Appropriate to Mood and Circumstances   Preoccupation:   None   Hallucinations:   None   Organization:   Coherent   [employer] of Knowledge:   Average   Intelligence:   Average   Abstraction:   Normal   Judgement:   Normal   Reality Testing:   Adequate   Insight:   Present   Decision Making:   Normal    Social Functioning  Social Maturity:   Responsible   Social Judgement:   Normal   Stress  Stressors:   Family conflict   Coping Ability:   Exhausted; Overwhelmed   Skill Deficits:   None   Supports:   Friends/Service system     Religion: Religion/Spirituality Are You A Religious Person?: Yes What is Your Religious Affiliation?: Baptist How Might This Affect Treatment?: N/A  Leisure/Recreation: Leisure / Recreation Do You Have Hobbies?: No  Exercise/Diet: Exercise/Diet Do You Exercise?: No Have You Gained or Lost A Significant Amount of Weight  in the Past Six Months?: Yes-Lost Number of Pounds Lost?: 30 (Per father's report) Do You Follow a Special Diet?: No Do You Have Any Trouble Sleeping?: No   CCA Employment/Education Employment/Work Situation: Employment / Work Situation Employment Situation: Unemployed Patient's Job has Been Impacted by Current Illness: No Has Patient ever Been in Passenger transport manager?: No  Education: Education Is Patient Currently Attending School?: No Last Grade Completed: 12 Did You Nutritional therapist?: Yes What Type of College Degree Do you Have?: Associates in science Did You Have An Individualized Education Program (IIEP): No Did You Have Any Difficulty At School?: No Patient's Education Has Been Impacted by Current Illness: No   CCA Family/Childhood History Family and Relationship History: Family history Marital status: Long term relationship Long term relationship, how long?: 3 years What types of issues is patient dealing with in the relationship?: None reported Additional relationship information: N/A Does patient have children?: Yes How many children?: 6 How is patient's relationship with their children?: Reports a positive relationship  Childhood History:  Childhood History By whom was/is the patient raised?: Father Did patient suffer any verbal/emotional/physical/sexual abuse as a child?: No Did patient suffer from  severe childhood neglect?: No Has patient ever been sexually abused/assaulted/raped as an adolescent or adult?: No Was the patient ever a victim of a crime or a disaster?: No Witnessed domestic violence?: No Has patient been affected by domestic violence as an adult?: No       CCA Substance Use Alcohol/Drug Use: Alcohol / Drug Use Pain Medications: See MAR Prescriptions: See MAR Over the Counter: See MAR History of alcohol / drug use?: Yes Longest period of sobriety (when/how long): Unknown Negative Consequences of Use: Personal relationships Withdrawal Symptoms: None Substance #1 Name of Substance 1: Marijuana 1 - Age of First Use: 29 1 - Amount (size/oz): 1 blunt 1 - Frequency: 1x-2x a month 1 - Duration: Ongoing 1 - Last Use / Amount: 48 hours ago 1 - Method of Aquiring: Peers 1- Route of Use: Smokes Substance #2 Name of Substance 2: Cocaine 2 - Age of First Use: UTA: patient denies hx 2 - Amount (size/oz): UTA: patient denies hx 2 - Frequency: UTA: patient denies hx 2 - Duration: UTA: patient denies hx 2 - Last Use / Amount: UTA: patient denies hx 2 - Method of Aquiring: UTA: patient denies hx 2 - Route of Substance Use: UTA: patient denies hx                     ASAM's:  Six Dimensions of Multidimensional Assessment  Dimension 1:  Acute Intoxication and/or Withdrawal Potential:   Dimension 1:  Description of individual's past and current experiences of substance use and withdrawal: Patient denies withdrawal symptoms  Dimension 2:  Biomedical Conditions and Complications:   Dimension 2:  Description of patient's biomedical conditions and  complications: Patient denied any health concerns  Dimension 3:  Emotional, Behavioral, or Cognitive Conditions and Complications:  Dimension 3:  Description of emotional, behavioral, or cognitive conditions and complications: Patient reports a diagnosis of anxiety  Dimension 4:  Readiness to Change:  Dimension 4:   Description of Readiness to Change criteria: Patient denies having a substance use problem  Dimension 5:  Relapse, Continued use, or Continued Problem Potential:  Dimension 5:  Relapse, continued use, or continued problem potential critiera description: Patient does not acknowledge a substace issue  Dimension 6:  Recovery/Living Environment:  Dimension 6:  Recovery/Iiving environment criteria description: Patient has the support  of her family  ASAM Severity Score: ASAM's Severity Rating Score: 7  ASAM Recommended Level of Treatment: ASAM Recommended Level of Treatment: Level II Intensive Outpatient Treatment   Substance use Disorder (SUD) Substance Use Disorder (SUD)  Checklist Symptoms of Substance Use: Continued use despite having a persistent/recurrent physical/psychological problem caused/exacerbated by use  Recommendations for Services/Supports/Treatments: Recommendations for Services/Supports/Treatments Recommendations For Services/Supports/Treatments: SAIOP (Substance Abuse Intensive Outpatient Program), Individual Therapy  Discharge Disposition:    DSM5 Diagnoses: Patient Active Problem List   Diagnosis Date Noted   Normal labor 08/10/2020   Unwanted fertility 07/07/2020   History of vaginal delivery following previous cesarean delivery 02/10/2020   Short interval between pregnancies affecting pregnancy in first trimester, antepartum 12/23/2019   Rh negative state in antepartum period 01/07/2019   Previous cesarean section 03/10/2018   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   Hx of preeclampsia 11/16/2015   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

## 2022-09-07 NOTE — ED Triage Notes (Signed)
Pt brought by [facility] with IVC paperwork. Per paperwork, pt's father is concerned that she may be suicidal and might be on drugs. In triage, pt states that she and her father had a disagreement and her father filed IVC papers to "get her to change her ways." Per officer in triage, pt has repeatedly denied suicidal ideation and has been calm and compliant since he picked her up today. NAD and pt adamantly denies SI

## 2022-09-07 NOTE — ED Notes (Addendum)
This note copied and pasted from secure message chat that was sent to this nurse from [place], NP recommends overnight observation to be reassessed by psychiatry in the morning  Dr Melina Copa and Merry Proud, ED charge nurse were both added to this secure chat message

## 2022-09-07 NOTE — ED Notes (Signed)
Pt calm and cooperative- informed pt that we needed urine and then I could give her xanax. Pt agreed to this- pt ambulatory to bathroom, sitter present.

## 2022-09-07 NOTE — ED Provider Triage Note (Signed)
Emergency Medicine Provider Triage Evaluation Note  Meagan [name] , a 31 y.o. female  was evaluated in triage.  Pt brought by [facility] with IVC paperwork.  Per paperwork, patient's father concerned she may be SI and might be on drugs.  Patient states her and her father were in a disagreement last night and afterwards her father filed IVC papers to "get her to change her ways".  She reports her father is very controlling and wants to know her every move.  Patient does have history of anxiety and depression, is currently taking clonazepam and is seeking therapy.  Patient adamantly denies SI, HI, drug use, alcohol use.  Per officer with patient,  patient has repeatedly denied SI and has been calm and cooperative since he picked her up.    Review of Systems  Positive: As above Negative: As above  Physical Exam  BP 113/76 (BP Location: Right Arm)   Pulse (!) 110   Temp 98 F (36.7 C) (Oral)   Resp 14   Ht '5\' 2"'$  (1.575 m)   Wt 55.8 kg   LMP 08/30/2022 (Exact Date)   SpO2 100%   BMI 22.50 kg/m  Gen:   Awake, no distress   Resp:  Normal effort  MSK:   Moves extremities without difficulty  Other:  Patient denies SI multiple times stating she "would never even think to harm herself"; she makes good eye contact, does not appear to be responding to internal stimuli; she is mildly anxious, but is cooperative, non-agitated   Medical Decision Making  Medically screening exam initiated at 5:13 PM.  Appropriate orders placed.  PRINCIE FASH was informed that the remainder of the evaluation will be completed by another provider, this initial triage assessment does not replace that evaluation, and the importance of remaining in the ED until their evaluation is complete.     Theressa Stamps R, [place] 09/07/22 [phone]

## 2022-09-07 NOTE — ED Notes (Addendum)
TTS aware of need for consult- paperwork has been faxed as they requested

## 2022-09-07 NOTE — ED Notes (Signed)
Pt lying in bed with eyes closed at this time. Sitter at bedside.

## 2022-09-07 NOTE — ED Notes (Signed)
IVC paperwork faxed 

## 2022-09-07 NOTE — ED Notes (Signed)
TTS messaged reporting they are ready for TTS- cart is in room at this time, Hassan Rowan, sitter at bedside a/w [facility] to call

## 2022-09-08 ENCOUNTER — Ambulatory Visit (HOSPITAL_COMMUNITY)
Admission: EM | Admit: 2022-09-08 | Discharge: 2022-09-08 | Disposition: A | Discharge status: Home / Self Care | Payer: Medicaid Other | Attending: Urology | Admitting: Urology

## 2022-09-08 ENCOUNTER — Other Ambulatory Visit: Payer: Self-pay

## 2022-09-08 DIAGNOSIS — F410 Panic disorder [episodic paroxysmal anxiety] without agoraphobia: Secondary | ICD-10-CM | POA: Insufficient documentation

## 2022-09-08 DIAGNOSIS — Z79899 Other long term (current) drug therapy: Secondary | ICD-10-CM | POA: Insufficient documentation

## 2022-09-08 DIAGNOSIS — F1410 Cocaine abuse, uncomplicated: Secondary | ICD-10-CM | POA: Diagnosis not present

## 2022-09-08 DIAGNOSIS — F1910 Other psychoactive substance abuse, uncomplicated: Secondary | ICD-10-CM | POA: Insufficient documentation

## 2022-09-08 DIAGNOSIS — F1990 Other psychoactive substance use, unspecified, uncomplicated: Secondary | ICD-10-CM

## 2022-09-08 DIAGNOSIS — Z046 Encounter for general psychiatric examination, requested by authority: Secondary | ICD-10-CM

## 2022-09-08 DIAGNOSIS — F419 Anxiety disorder, unspecified: Secondary | ICD-10-CM | POA: Insufficient documentation

## 2022-09-08 LAB — SARS CORONAVIRUS 2 BY RT PCR: SARS Coronavirus 2 by RT PCR: NEGATIVE

## 2022-09-08 MED ORDER — ACETAMINOPHEN 325 MG PO TABS: 650.0000 mg | ORAL_TABLET | Freq: Four times a day (QID) | ORAL | Status: DC | PRN

## 2022-09-08 MED ORDER — ALUM & MAG HYDROXIDE-SIMETH 200-200-20 MG/5ML PO SUSP: 30.0000 mL | ORAL | Status: DC | PRN

## 2022-09-08 MED ORDER — TRAZODONE HCL 50 MG PO TABS: 50.0000 mg | ORAL_TABLET | Freq: Every evening | ORAL | Status: DC | PRN

## 2022-09-08 MED ORDER — HYDROXYZINE HCL 25 MG PO TABS: 25.0000 mg | ORAL_TABLET | Freq: Three times a day (TID) | ORAL | Status: DC | PRN

## 2022-09-08 MED ORDER — MAGNESIUM HYDROXIDE 400 MG/5ML PO SUSP: 30.0000 mL | Freq: Every day | ORAL | Status: DC | PRN

## 2022-09-08 NOTE — ED Notes (Addendum)
Patient A&O x 4, ambulatory. Patient discharged in no acute distress. Patient denied SI/HI, A/VH upon discharge. Patient verbalized understanding of all discharge instructions explained by staff, to include follow up appointments, RX's .Patient reported mood 10/10.  Pt belongings returned to patient from locker #   30 intact. Patient escorted to lobby via staff for transport to destination. Safety maintained. Provider rescinded the ivc.

## 2022-09-08 NOTE — ED Notes (Addendum)
Pt A&O x 4, under IVC,  transfer from [facility], presents with initial complaint of SI and drug abuse.  Pt adamantly denies SI, cooperative and slightly anxious.  Pt IVCed by the father.  Monitoring for safety.

## 2022-09-08 NOTE — ED Provider Notes (Signed)
Patient accepted to [facility] for further management. [facility] is accepting.    Truddie Hidden, MD 09/08/22 [phone]

## 2022-09-08 NOTE — ED Provider Notes (Signed)
Behavioral Health Urgent Care Medical Screening Exam  Patient Name: RAYNISHA JALALI MRN: [id number] Date of Evaluation: 09/08/22 Chief Complaint:  IVC and substance use.  Diagnosis:  Final diagnoses:  Substance use disorder  Involuntary commitment    History of Present illness: Meagan [name] is a 32 y.o. female patient with a past psychiatric history significant for anxiety, panic attacks, MDD, and polysubstance (cocaine, THC, opiates) abuse who was transferred from [facility] to [facility] behavioral health urgent care for observation under IVC for an evaluation.   Patient seen and evaluated face-to-face by this provider, chart reviewed and case discussed with Dr. Dwyane Dee. Per chart review, patient was evaluated on 08/07/22 at the [facility] for an unintentional opiate overdose.  On evaluation, patient is alert and oriented x 4. Her thought process is logical and speech is clear and coherent at a moderate tone. Her mood is dysphoric and affect is congruent. She has fair eye contact. She denies SI/HI/AVH. There is no objective evidence that the patient is currently responding to internal or external stimuli. She is calm and cooperative. She does not appear to be in acute distress. Patient states that she has 6 children waiting on her. She states that her dad called the sheriff department to get her admitted to the hospital by telling them that she is going to harm herself. She denies past or present suicidal ideations. She denies sending messages saying she wants to die. She denies past suicide attempts. She states that her dad is controlling and he freaks out when she does not to what he wants her to do. She states that she lives side by side her father [place] a farm. She resides with her 4 children ages 18, 29, 49 and 27. She states that her father has temporary custody of her 30 year old child and that her ex boyfriend mother has temporary custody of her 62  year old child. She denies  access to firearms in the home. She admits to using cocaine for the past three weeks. She reports using cocaine socially. She reports last using cocaine 3 days ago. She report smoking marijuana occasionally. She denies using methamphetamine and states that her cocaine was probably laced with methamphetamines. UDS positive for cocaine, THC and amphetamine. She denies drinking alcohol. She denies past substance abuse treatments. She reports taking Klonopin 0.5 mg TID prn prescribed by her PCP Dr. Nancy Fetter for anxiety and panic attacks. She denies anxiety or depressive symptoms at this time. She reports fair sleep. She reports a fair appetite. She reports experiencing sweats from not taking her Klonopin today. No significant benzo withdrawal symptoms noted on exam. UDS negative for benzodiazepines. She denies outpatient psychiatry or therapy. She denies past inpatient psychiatric hospitalizations. She states that she works full-time as a Quarry manager for [employer] She states that she is interested in outpatient substance abuse treatment but not inpatient substance abuse treatment. She is agreeable to outpatient psychiatry and therapy. She states that she needs to get back home to her children and return back to work. She gives verbal consent for this provider to safety plan with her father.   I spoke to Mr. [name] [phone] via telephone. He confirms that the patient does live on the same property as he does and does not have access to firearms in her home. He states that the patient needs to get substance abuse treatment so that she can take care of her children. He states that she hangs around  heroin addicts. He states that she started using cocaine and that he did what he did to try and get her on track. Although, he is not agreeable with the patient discharging, he states that he can pick the patient up from the facility today at the time of discharge. He was advised that the patient has been provided with  outpatient and residential resources for substance abuse treatment and resources for outpatient psychiatry and therapy. He verbalizes understanding.    [facility] from 09/08/2022 in [facility] ED from 09/07/2022 in [facility] Department at [facility] ED from 08/07/2022 in [facility] Emergency Department at [place] No Risk No Risk No Risk       Psychiatric Specialty Exam  Presentation  General Appearance:Appropriate for Environment  Eye Contact:Fair  Speech:Clear and Coherent  Speech Volume:Normal  Handedness:Right   Mood and Affect  Mood: Euphoric  Affect: Congruent   Thought Process  Thought Processes: Coherent; Goal Directed  Descriptions of Associations:Intact  Orientation:Full (Time, Place and Person)  Thought Content:Logical  Diagnosis of Schizophrenia or Schizoaffective disorder in past: No   Hallucinations:None  Ideas of Reference:None  Suicidal Thoughts:No  Homicidal Thoughts:No   Sensorium  Memory: Immediate Fair; Recent Fair; Remote Fair  Judgment: Fair  Insight: Fair   Community education officer  Concentration: Fair  Attention Span: Fair  Recall: [employer] of Knowledge: Fair  Language: Fair   Psychomotor Activity  Psychomotor Activity: Increased   Assets  Assets: Communication Skills; Desire for Improvement; Financial Resources/Insurance; Housing; Leisure Time; Physical Health; Social Support; Transport planner; Vocational/Educational   Sleep  Sleep: Fair  Number of hours:  8   Physical Exam: Physical Exam HENT:     Head: Normocephalic.     Nose: Nose normal.  Eyes:     Conjunctiva/sclera: Conjunctivae normal.  Cardiovascular:     Rate and Rhythm: Normal rate.  Pulmonary:     Effort: Pulmonary effort is normal.  Musculoskeletal:        General: Normal range of motion.     Cervical back: Normal range of motion.   Neurological:     Mental Status: She is alert and oriented to person, place, and time.    Review of Systems  Constitutional:  Positive for diaphoresis.  HENT: Negative.    Eyes: Negative.   Respiratory: Negative.    Cardiovascular: Negative.   Gastrointestinal: Negative.   Genitourinary: Negative.   Musculoskeletal: Negative.   Skin: Negative.   Neurological: Negative.   Endo/Heme/Allergies: Negative.   Psychiatric/Behavioral:  Positive for substance abuse.    Blood pressure 106/64, pulse 74, temperature 98.4 F (36.9 C), temperature source Oral, resp. rate 18, last menstrual period 08/30/2022, SpO2 100 %, currently breastfeeding. There is no height or weight on file to calculate BMI.  Musculoskeletal: Strength & Muscle Tone: within normal limits Gait & Station: normal Patient leans: N/A   [facility] Discharge Disposition for Follow up and Recommendations: Based on my evaluation the patient does not appear to have an emergency medical condition and can be discharged with resources and follow up care in outpatient services for Medication Management, Substance Abuse Intensive Outpatient Program, Individual Therapy, and Group Therapy  Rescind IVC After thorough evaluation and review of information currently presented on assessment of NAVID MUSACCHIO (respondent), there is insufficient findings to indicate respondent meets criteria for involuntary commitment or require an inpatient level of care. Respondent is alert/oriented x 4, calm, cooperative, and  mood congruent with affect. Respondent is speaking in a clear tone at moderate volume, and normal pace, with good eye contact.  Respondents' thought process is coherent, relevant, and there is no indication that the respondent is currently responding to internal/external stimuli or experiencing delusional thought content. Respondent denies suicidal/self-harm/homicidal ideation, psychosis, and paranoia. Respondent has no past history of  suicide attempts or family history of completed suicide. Respondent has remained calm and cooperative throughout assessment and responded to questions appropriately. Currently respondent is not significantly impaired, psychotic, or manic on exam. There is no evidence of imminent risk to self or others at present and respondent does not meet criteria for psychiatric inpatient admission.  Although this patient presented under IVC, she do not appear to be at imminent risk of dangerousness to self and dangerousness to others at this time. While future psychiatric events cannot be accurately predicted, the patient does not necessitate nor desire further acute inpatient psychiatric care at this time. This patient presents with risk factors that include substance abuse. These are mitigated by protective factors which include lack of active SI/HI, no history of previous suicide attempts , no history of violence, sense of responsibility to family and social supports, presence of an available support system, employment or functioning in a structured work/academic setting, enjoyment of leisure actvities, and expresses purpose for living.  Phu Record L, NP 09/08/2022, 1:07 PM

## 2022-09-08 NOTE — ED Notes (Signed)
Patient alert and oriented x 3. Denies SI/HI/AVH. Denies intent or plan to harm self or others. Routine conducted according to faculty protocol. Encourage patient to notify staff with any needs or concerns. Patient verbalized agreement and understanding. Will continue to monitor for safety. 

## 2022-09-08 NOTE — Discharge Instructions (Addendum)
Discharge recommendations:   Medications: Patient is to take medications as prescribed.  No medication changes were made during your visit. The patient or patient's guardian is to contact a medical professional and/or outpatient provider to address any new side effects that develop. The patient or the patient's guardian should update outpatient providers of any new medications and/or medication changes.   Outpatient Follow up: Please review list of outpatient resources for psychiatry and counseling. Please follow up with your primary care provider for all medical related needs.   Therapy: We recommend that patient participate in individual therapy to address mental health concerns.  Safety:   The following safety precautions should be taken:   No sharp objects. This includes scissors, razors, scrapers, and putty knives.   Chemicals should be removed and locked up.   Medications should be removed and locked up.   Weapons should be removed and locked up. This includes firearms, knives and instruments that can be used to cause injury.   The patient should abstain from use of illicit substances/drugs and abuse of any medications.  If symptoms worsen or do not continue to improve or if the patient becomes actively suicidal or homicidal then it is recommended that the patient return to the closest hospital emergency department, the [facility], or call 911 for further evaluation and treatment. National Suicide Prevention Lifeline 1-800-SUICIDE or 959-186-7932.  About 988 988 offers 24/7 access to trained crisis counselors who can help people experiencing mental health-related distress. People can call or text 988 or chat 988lifeline.org for themselves or if they are worried about a loved one who may need crisis support.   Please contact one of the following facilities to start medication management and therapy services:   [facility]  [address],  [place] [address] Phone: [phone]  [facility]  [address]  [place], [place] [address]  [facility]  [address]  [place],  [address] Phone: [phone]

## 2022-09-08 NOTE — ED Notes (Signed)
Patient  sleeping in no acute stress. RR even and unlabored .Environment secured .Will continue to monitor for safely. 

## 2022-09-26 ENCOUNTER — Ambulatory Visit: Payer: Commercial Managed Care - HMO | Admitting: Adult Health

## 2022-12-25 ENCOUNTER — Other Ambulatory Visit: Payer: Self-pay

## 2022-12-25 ENCOUNTER — Ambulatory Visit
Admission: EM | Admit: 2022-12-25 | Discharge: 2022-12-25 | Disposition: A | Discharge status: Home / Self Care | Payer: Commercial Managed Care - HMO

## 2022-12-25 ENCOUNTER — Encounter: Payer: Self-pay | Admitting: Emergency Medicine

## 2022-12-25 DIAGNOSIS — R1084 Generalized abdominal pain: Secondary | ICD-10-CM | POA: Diagnosis not present

## 2022-12-25 DIAGNOSIS — R319 Hematuria, unspecified: Secondary | ICD-10-CM

## 2022-12-25 LAB — POCT URINALYSIS DIP (MANUAL ENTRY)
Bilirubin, UA: NEGATIVE
Glucose, UA: NEGATIVE mg/dL
Ketones, POC UA: NEGATIVE mg/dL
Leukocytes, UA: NEGATIVE
Nitrite, UA: NEGATIVE
Protein Ur, POC: NEGATIVE mg/dL
Spec Grav, UA: 1.02 (ref 1.010–1.025)
Urobilinogen, UA: 0.2 E.U./dL
pH, UA: 7 (ref 5.0–8.0)

## 2022-12-25 LAB — POCT URINE PREGNANCY: Preg Test, Ur: NEGATIVE

## 2022-12-25 NOTE — ED Provider Notes (Signed)
[facility]    CSN: [id number] Arrival date & time: 12/25/22  1944      History   Chief Complaint Chief Complaint  Patient presents with   Abdominal Pain    HPI Meagan [name] is a 32 y.o. female.   Patient presents today for abdominal pain ongoing for the past 3 days.  Reports initially when pain began, she had pain under her right rib cage, now has moved to the left rib cage.  Reports pain is worse when she strains, coughs, or moves in certain positions.  Reports the pain is currently 6 out of 10 and is a dull/sharp pain.  The pain is constant.  The pain does not radiate around to her back or down either leg or any other part of her abdomen.  She does occasionally have pain under her right scapula as well.  Patient has taken [facility] which seem to help a little bit.  Also states she has been very gassy.  Patient denies fever, body aches or chills, nausea/vomiting, constipation or diarrhea, blood in the stool, heartburn, indigestion, new rash, and urinary symptoms.  Reports her appetite has been normal and she had a normal bowel movement yesterday.   Patient also concerned because she had sexual intercourse with a female a few days ago who had a large penis and reports she began having pain during intercourse and thinks he may have penetrated "too deep."  She had some bleeding after intercourse and now is spotting a little bit.  Is concerned that she may be anemic.  Reports history of anemia.    Past Medical History:  Diagnosis Date   Abdominal cramps 12/06/2015   Anemia    Anxiety    Asthma    inhaler at bedside   Daily headache    Diarrhea 06/04/2013   Hemiplegic migraine    blurred vision, nausea, dizziness, numbness   Hemiplegic migraine, with intractable migraine, so stated, with status migrainosus 2016   Kidney stones    Mental disorder    huffed inhalants "affected brain"   Pyelonephritis complicating pregnancy, antepartum, first trimester 11/05/2011    Stomach ulcer     Patient Active Problem List   Diagnosis Date Noted   Normal labor 08/10/2020   Unwanted fertility 07/07/2020   History of vaginal delivery following previous cesarean delivery 02/10/2020   Short interval between pregnancies affecting pregnancy in first trimester, antepartum 12/23/2019   Rh negative state in antepartum period 01/07/2019   Previous cesarean section 03/10/2018   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   Hx of preeclampsia 11/16/2015   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   Smoker 12/16/2013   Anxiety and depression 06/17/2013    Past Surgical History:  Procedure Laterality Date   ANKLE SURGERY Left 09/2016   plates & screws   CESAREAN SECTION N/A 12/19/2017   Procedure: CESAREAN SECTION;  Surgeon: Lazaro Arms, MD;  Location: [facility];  Service: Obstetrics;  Laterality: N/A;    OB History     Gravida  6   Para  6   Term  5   Preterm  1   AB  0   Living  6      SAB  0   IAB  0   Ectopic  0   Multiple  0   Live Births  6        Obstetric Comments  G4=LTCS (not vertical) per op note          Home  Medications    Prior to Admission medications   Medication Sig Start Date End Date Taking? Authorizing Provider  clonazePAM (KLONOPIN) 0.5 MG tablet Take 0.5 mg by mouth 2 (two) times daily as needed for anxiety.   Yes [provider]  HYDROcodone-acetaminophen (NORCO/VICODIN) 5-325 MG tablet Take 1 tablet by mouth every 6 (six) hours as needed for moderate pain.   Yes [provider]  ALPRAZolam Prudy Feeler) 0.5 MG tablet 3 times daily as needed for anxiety and/or sleep 10/03/20   Lazaro Arms, MD  naloxone [facility]) 0.4 MG/ML injection Place 1 mL (0.4 mg total) into the nose as needed (decreased consciousness or breathing after taking narcotics). 08/07/22   Mesner, Barbara Cower, MD  norethindrone (MICRONOR) 0.35 MG tablet TAKE 1 TABLET BY MOUTH ONCE A DAY. Patient not taking: Reported on 12/25/2022  06/28/22   Jacklyn Shell, CNM    Family History Family History  Problem Relation Age of Onset   Cancer Maternal Grandmother        breast   Cancer Paternal Grandfather        lung cancer   Migraines Father    Hypertension Father    Hypercholesterolemia Father    Other Son        ventricular defect   Hypertension Other     Social History Social History   Tobacco Use   Smoking status: Every Day    Packs/day: 0.50    Years: 7.00    Additional pack years: 0.00    Total pack years: 3.50    Types: Cigarettes   Smokeless tobacco: Never  Vaping Use   Vaping Use: Former  Substance Use Topics   Alcohol use: Not Currently    Alcohol/week: 0.0 standard drinks of alcohol    Comment: occ; not now   Drug use: Not Currently    Types: Other-see comments, Marijuana    Comment: huffed inhalants in past, "affected Brain"; as of 08/01/20 "oncce in the last 30 days"     Allergies   Penicillins and Sulfamethoxazole-trimethoprim   Review of Systems Review of Systems Per HPI  Physical Exam Triage Vital Signs ED Triage Vitals [12/25/22 1957]  Enc Vitals Group     BP 108/71     Pulse Rate 98     Resp 20     Temp 98.1 F (36.7 C)     Temp Source Oral     SpO2 97 %     Weight      Height      Head Circumference      Peak Flow      Pain Score      Pain Loc      Pain Edu?      Excl. in GC?    No data found.  Updated Vital Signs BP 108/71 (BP Location: Right Arm)   Pulse 98   Temp 98.1 F (36.7 C) (Oral)   Resp 20   LMP 12/18/2022 (Approximate)   SpO2 97%   Breastfeeding No   Visual Acuity Right Eye Distance:   Left Eye Distance:   Bilateral Distance:    Right Eye Near:   Left Eye Near:    Bilateral Near:     Physical Exam Vitals and nursing note reviewed.  Constitutional:      General: She is not in acute distress.    Appearance: Normal appearance. She is not toxic-appearing.  HENT:     Head: Normocephalic and atraumatic.     Mouth/Throat:  Mouth: Mucous membranes are moist.     Pharynx: Oropharynx is clear.  Eyes:     General: No scleral icterus.    Extraocular Movements: Extraocular movements intact.  Cardiovascular:     Rate and Rhythm: Normal rate and regular rhythm.  Pulmonary:     Effort: Pulmonary effort is normal. No respiratory distress.     Breath sounds: Normal breath sounds. No wheezing, rhonchi or rales.  Abdominal:     General: Abdomen is flat. Bowel sounds are normal. There is no distension.     Palpations: Abdomen is soft.     Tenderness: There is generalized abdominal tenderness. There is no right CVA tenderness, left CVA tenderness, guarding or rebound. Negative signs include Murphy's sign, Rovsing's sign and McBurney's sign.  Musculoskeletal:     Cervical back: Normal range of motion.  Lymphadenopathy:     Cervical: No cervical adenopathy.  Skin:    General: Skin is warm and dry.     Capillary Refill: Capillary refill takes less than 2 seconds.     Coloration: Skin is not jaundiced or pale.     Findings: No erythema.  Neurological:     Mental Status: She is alert and oriented to person, place, and time.  Psychiatric:        Mood and Affect: Mood is not anxious.        Speech: Speech is rapid and pressured and tangential.        Behavior: Behavior is cooperative.      UC Treatments / Results  Labs (all labs ordered are listed, but only abnormal results are displayed) Labs Reviewed  POCT URINALYSIS DIP (MANUAL ENTRY) - Abnormal; Notable for the following components:      Result Value   Blood, UA trace-intact (*)    All other components within normal limits  POCT URINE PREGNANCY    EKG   Radiology No results found.  Procedures Procedures (including critical care time)  Medications Ordered in UC Medications - No data to display  Initial Impression / Assessment and Plan / UC Course  I have reviewed the triage vital signs and the nursing notes.  Pertinent labs & imaging  results that were available during my care of the patient were reviewed by me and considered in my medical decision making (see chart for details).   Patient is well-appearing, normotensive, afebrile, not tachycardic, not tachypneic, oxygenating well on room air.    1. Generalized abdominal pain 2. Hematuria, unspecified type Urinalysis today shows trace intact blood, otherwise unremarkable UPT negative Patient has had female sterilization procedure Differentials for abdominal pain include nephrolithiasis, gas, constipation, lower suspicion for acute abdominal process to include appendicitis or cholecystitis given benign abdominal exam Blood work deferred today given benign abdominal exam, recommended pushing hydration with water, start Gas-X Strict ER precautions discussed with patient  The patient was given the opportunity to ask questions.  All questions answered to their satisfaction.  The patient is in agreement to this plan.    Final Clinical Impressions(s) / UC Diagnoses   Final diagnoses:  Generalized abdominal pain  Hematuria, unspecified type     Discharge Instructions      I suspect the pain you are having is either coming from a kidney stone or gas pain.  Please take some Gas X and push fluids with plenty of water.  If you develop fever or nausea/vomiting and not able to keep fluids down, go to the ER.      ED Prescriptions  None    I have reviewed the PDMP during this encounter.   Valentino Nose, NP 12/26/22 1149

## 2022-12-25 NOTE — ED Triage Notes (Signed)
Pt reports intermittent RUQ pain for last few days. Pt reports pain is now moved around to bilateral flank and reports vaginal bleeding with clots. Reports bleeding has slowed but reports is still having to use a tampon. Denies any known fevers. Reports intercourse with a recent partner and reports pain during intercourse around time symptoms started.  LBM yesterday.  LMP 12/18/2022.

## 2022-12-25 NOTE — Discharge Instructions (Signed)
I suspect the pain you are having is either coming from a kidney stone or gas pain.  Please take some Gas X and push fluids with plenty of water.  If you develop fever or nausea/vomiting and not able to keep fluids down, go to the ER.

## 2022-12-26 ENCOUNTER — Telehealth: Payer: Self-pay | Admitting: Emergency Medicine

## 2022-12-26 NOTE — Telephone Encounter (Signed)
Pt called and reported pain has increased and could barely breathe through it. Pt reported has an ultrasound appt today with [facility] at 2:45 but reports "I can't wait until then to be seen". Reviewed discharge instructions with patient and reported per provider notes recommend ED at this time. Pt verbalized understanding.

## 2022-12-28 ENCOUNTER — Emergency Department (HOSPITAL_COMMUNITY): Payer: Commercial Managed Care - HMO

## 2022-12-28 ENCOUNTER — Encounter (HOSPITAL_COMMUNITY): Payer: Self-pay | Admitting: Emergency Medicine

## 2022-12-28 ENCOUNTER — Other Ambulatory Visit: Payer: Self-pay

## 2022-12-28 ENCOUNTER — Emergency Department (HOSPITAL_COMMUNITY)
Admission: EM | Admit: 2022-12-28 | Discharge: 2022-12-28 | Disposition: A | Discharge status: Home / Self Care | Payer: Commercial Managed Care - HMO | Attending: Emergency Medicine | Admitting: Emergency Medicine

## 2022-12-28 DIAGNOSIS — R197 Diarrhea, unspecified: Secondary | ICD-10-CM | POA: Diagnosis not present

## 2022-12-28 DIAGNOSIS — R1084 Generalized abdominal pain: Secondary | ICD-10-CM | POA: Diagnosis not present

## 2022-12-28 DIAGNOSIS — R6883 Chills (without fever): Secondary | ICD-10-CM | POA: Diagnosis not present

## 2022-12-28 DIAGNOSIS — R079 Chest pain, unspecified: Secondary | ICD-10-CM | POA: Insufficient documentation

## 2022-12-28 DIAGNOSIS — R112 Nausea with vomiting, unspecified: Secondary | ICD-10-CM | POA: Diagnosis not present

## 2022-12-28 DIAGNOSIS — R109 Unspecified abdominal pain: Secondary | ICD-10-CM | POA: Diagnosis present

## 2022-12-28 LAB — COMPREHENSIVE METABOLIC PANEL
ALT: 21 U/L (ref 0–44)
AST: 12 U/L — ABNORMAL LOW (ref 15–41)
Albumin: 3.3 g/dL — ABNORMAL LOW (ref 3.5–5.0)
Alkaline Phosphatase: 63 U/L (ref 38–126)
Anion gap: 11 (ref 5–15)
BUN: 15 mg/dL (ref 6–20)
CO2: 23 mmol/L (ref 22–32)
Calcium: 8.8 mg/dL — ABNORMAL LOW (ref 8.9–10.3)
Chloride: 102 mmol/L (ref 98–111)
Creatinine, Ser: 0.82 mg/dL (ref 0.44–1.00)
GFR, Estimated: >60 mL/min (ref >60)
Glucose, Bld: 110 mg/dL — ABNORMAL HIGH (ref 70–99)
Potassium: 3.4 mmol/L — ABNORMAL LOW (ref 3.5–5.1)
Sodium: 136 mmol/L (ref 135–145)
Total Bilirubin: 0.3 mg/dL (ref 0.3–1.2)
Total Protein: 7.4 g/dL (ref 6.5–8.1)

## 2022-12-28 LAB — CBC
HCT: 41.8 % (ref 36.0–46.0)
Hemoglobin: 13.8 g/dL (ref 12.0–15.0)
MCH: 30.7 pg (ref 26.0–34.0)
MCHC: 33 g/dL (ref 30.0–36.0)
MCV: 92.9 fL (ref 80.0–100.0)
Platelets: 328 10*3/uL (ref 150–400)
RBC: 4.5 MIL/uL (ref 3.87–5.11)
RDW: 13.2 % (ref 11.5–15.5)
WBC: 9.5 10*3/uL (ref 4.0–10.5)
nRBC: 0 % (ref 0.0–0.2)

## 2022-12-28 LAB — LACTIC ACID, PLASMA
Lactic Acid, Venous: 1.3 mmol/L (ref 0.5–1.9)
Lactic Acid, Venous: 0.6 mmol/L (ref 0.5–1.9)

## 2022-12-28 LAB — LIPASE, BLOOD: Lipase: 23 U/L (ref 11–51)

## 2022-12-28 LAB — HCG, QUANTITATIVE, PREGNANCY: hCG, Beta Chain, Quant, S: <1 m[IU]/mL (ref <5)

## 2022-12-28 MED ORDER — POTASSIUM CHLORIDE CRYS ER 20 MEQ PO TBCR
40.0000 meq | EXTENDED_RELEASE_TABLET | Freq: Once | ORAL | Status: AC
  Administered 2022-12-28: 40 meq via ORAL
  Filled 2022-12-28: qty 2

## 2022-12-28 MED ORDER — IOHEXOL 9 MG/ML PO SOLN
ORAL | Status: AC
  Filled 2022-12-28: qty 1000

## 2022-12-28 MED ORDER — IOHEXOL 300 MG/ML  SOLN
100.0000 mL | Freq: Once | INTRAMUSCULAR | Status: AC | PRN
  Administered 2022-12-28: 80 mL via INTRAVENOUS

## 2022-12-28 MED ORDER — SODIUM CHLORIDE 0.9 % IV BOLUS
1000.0000 mL | Freq: Once | INTRAVENOUS | Status: AC
  Administered 2022-12-28: 1000 mL via INTRAVENOUS

## 2022-12-28 MED ORDER — HYDROMORPHONE HCL 1 MG/ML IJ SOLN
1.0000 mg | Freq: Once | INTRAMUSCULAR | Status: AC
  Administered 2022-12-28: 1 mg via INTRAVENOUS
  Filled 2022-12-28: qty 1

## 2022-12-28 MED ORDER — ONDANSETRON HCL 4 MG/2ML IJ SOLN
4.0000 mg | Freq: Once | INTRAMUSCULAR | Status: AC
  Administered 2022-12-28: 4 mg via INTRAVENOUS
  Filled 2022-12-28: qty 2

## 2022-12-28 MED ORDER — MAGNESIUM SULFATE 2 GM/50ML IV SOLN
2.0000 g | Freq: Once | INTRAVENOUS | Status: AC
  Administered 2022-12-28: 2 g via INTRAVENOUS
  Filled 2022-12-28: qty 50

## 2022-12-28 MED ORDER — ONDANSETRON HCL 4 MG PO TABS: 4.0000 mg | ORAL_TABLET | Freq: Three times a day (TID) | ORAL | 0 refills | Status: AC | PRN

## 2022-12-28 NOTE — Discharge Instructions (Addendum)
You are seen in the emergency department for nausea vomiting diarrhea and abdominal pain.  You had blood work urinalysis and a CAT scan.  Your small intestine was slightly enlarged and this can be caused by an enteritis or stomach bug.  Your symptoms improved with some IV fluids and medication.  Please start with a clear liquid diet and advance as tolerated.  We are sending your pharmacy a prescription for some nausea medication.  Please return the emergency department if any worsening or concerning symptoms.

## 2022-12-28 NOTE — ED Notes (Signed)
MD at bedside, EKG given to MD

## 2022-12-28 NOTE — ED Notes (Signed)
Pt finished 1 cup of water without any problems

## 2022-12-28 NOTE — ED Notes (Signed)
Pt has 2nd bottle of oral contrast at bedside. Encouraged to consume for scan. Mother remains at bedside.

## 2022-12-28 NOTE — ED Notes (Signed)
Pt just told this RN "I took a small piece of 15mg  of an oxycodone pill like 2 hours before coming here because I was in so much pain  but now I am not sure if it was oxycodone or what it was because I feel bad" also states "I took a pill for anxiety"--MD made aware

## 2022-12-28 NOTE — ED Provider Notes (Signed)
[place] EMERGENCY DEPARTMENT AT [facility] Provider Note   CSN: [id number] Arrival date & time: 12/28/22  1556     History {Add pertinent medical, surgical, social history, OB history to HPI:1} Chief Complaint  Patient presents with   Abdominal Pain    Meagan [name] is a 32 y.o. female.  She is here with a complaint of severe abdominal pain that started about 6 days ago.  She said it is in her upper abdomen mostly but radiates into her ribs shoulder and neck.  Associated with nausea but no vomiting.  No fever but has been having chills.  No diarrhea constipation or urinary symptoms.  Last menstrual period was 10 days ago, still having some spotting.  Prior surgical history of C-section.  She tells me she went to urgent care and they did some lab work but she never followed up with him.  Rates her pain as 10 out of 10.  She denies any alcohol use or drug use.  The history is provided by the patient.  Abdominal Pain Pain location:  Generalized Pain quality: aching and stabbing   Pain radiates to:  Chest and back Pain severity:  Severe Onset quality:  Gradual Duration:  6 days Timing:  Constant Progression:  Worsening Chronicity:  New Context: not trauma   Relieved by:  None tried Worsened by:  Nothing Ineffective treatments:  None tried Associated symptoms: chest pain, chills and nausea   Associated symptoms: no constipation, no cough, no diarrhea, no dysuria, no fever, no hematemesis, no hematochezia, no hematuria and no vomiting        Home Medications Prior to Admission medications   Medication Sig Start Date End Date Taking? Authorizing Provider  ALPRAZolam Prudy Feeler) 0.5 MG tablet 3 times daily as needed for anxiety and/or sleep 10/03/20   Lazaro Arms, MD  clonazePAM (KLONOPIN) 0.5 MG tablet Take 0.5 mg by mouth 2 (two) times daily as needed for anxiety.    [provider]  HYDROcodone-acetaminophen (NORCO/VICODIN) 5-325 MG tablet Take 1  tablet by mouth every 6 (six) hours as needed for moderate pain.    [provider]  naloxone (NARCAN) 0.4 MG/ML injection Place 1 mL (0.4 mg total) into the nose as needed (decreased consciousness or breathing after taking narcotics). 08/07/22   Mesner, Barbara Cower, MD  norethindrone (MICRONOR) 0.35 MG tablet TAKE 1 TABLET BY MOUTH ONCE A DAY. Patient not taking: Reported on 12/25/2022 06/28/22   Cresenzo-Dishmon, Scarlette Calico, CNM      Allergies    Penicillins and Sulfamethoxazole-trimethoprim    Review of Systems   Review of Systems  Constitutional:  Positive for chills. Negative for fever.  Respiratory:  Negative for cough.   Cardiovascular:  Positive for chest pain.  Gastrointestinal:  Positive for abdominal pain and nausea. Negative for constipation, diarrhea, hematemesis, hematochezia and vomiting.  Genitourinary:  Negative for dysuria and hematuria.    Physical Exam Updated Vital Signs BP (!) 82/48 (BP Location: Left Arm)   Pulse 98   Temp 98 F (36.7 C) (Oral)   Resp (!) 24   Ht 5\' 2"  (1.575 m)   Wt 52.2 kg   LMP 12/18/2022 (Approximate)   SpO2 97%   BMI 21.03 kg/m  Physical Exam Vitals and nursing note reviewed.  Constitutional:      General: She is in acute distress.     Appearance: She is well-developed.  HENT:     Head: Normocephalic and atraumatic.  Eyes:     Conjunctiva/sclera:  Conjunctivae normal.  Cardiovascular:     Rate and Rhythm: Normal rate and regular rhythm.     Heart sounds: No murmur heard. Pulmonary:     Effort: Pulmonary effort is normal. No respiratory distress.     Breath sounds: Normal breath sounds.  Abdominal:     General: Abdomen is flat.     Palpations: Abdomen is soft.     Tenderness: There is generalized abdominal tenderness. There is no guarding or rebound.  Musculoskeletal:        General: No swelling.     Cervical back: Neck supple.  Skin:    General: Skin is warm and dry.     Capillary Refill: Capillary refill takes less than  2 seconds.  Neurological:     General: No focal deficit present.     Mental Status: She is alert.     ED Results / Procedures / Treatments   Labs (all labs ordered are listed, but only abnormal results are displayed) Labs Reviewed  LIPASE, BLOOD  COMPREHENSIVE METABOLIC PANEL  CBC  URINALYSIS, ROUTINE W REFLEX MICROSCOPIC  LACTIC ACID, PLASMA  LACTIC ACID, PLASMA  HCG, SERUM, QUALITATIVE  POC URINE PREG, ED    EKG EKG Interpretation Date/Time:  Friday December 28 2022 16:31:58 EDT Ventricular Rate:  87 PR Interval:  105 QRS Duration:  71 QT Interval:  359 QTC Calculation: 432 R Axis:   -47  Text Interpretation: Sinus rhythm Short PR interval Left anterior fascicular block No significant change since prior 3/24 Confirmed by Meridee Score [phone]) on 12/28/2022 4:45:34 PM  Radiology No results found.  Procedures Procedures  {Document cardiac monitor, telemetry assessment procedure when appropriate:1}  Medications Ordered in ED Medications  sodium chloride 0.9 % bolus 1,000 mL (has no administration in time range)  HYDROmorphone (DILAUDID) injection 1 mg (has no administration in time range)  ondansetron (ZOFRAN) injection 4 mg (has no administration in time range)    ED Course/ Medical Decision Making/ A&P   {   Click here for ABCD2, HEART and other calculatorsREFRESH Note before signing :1}                          Medical Decision Making Amount and/or Complexity of Data Reviewed Labs: ordered.  Risk Prescription drug management.   This patient complains of ***; this involves an extensive number of treatment Options and is a complaint that carries with it a high risk of complications and morbidity. The differential includes ***  I ordered, reviewed and interpreted labs, which included *** I ordered medication *** and reviewed PMP when indicated. I ordered imaging studies which included *** and I independently    visualized and interpreted imaging which  showed *** Additional history obtained from *** Previous records obtained and reviewed *** I consulted *** and discussed lab and imaging findings and discussed disposition.  Cardiac monitoring reviewed, *** Social determinants considered, *** Critical Interventions: ***  After the interventions stated above, I reevaluated the patient and found *** Admission and further testing considered, ***   {Document critical care time when appropriate:1} {Document review of labs and clinical decision tools ie heart score, Chads2Vasc2 etc:1}  {Document your independent review of radiology images, and any outside records:1} {Document your discussion with family members, caretakers, and with consultants:1} {Document social determinants of health affecting pt's care:1} {Document your decision making why or why not admission, treatments were needed:1} Final Clinical Impression(s) / ED Diagnoses Final diagnoses:  None  Rx / DC Orders ED Discharge Orders     None

## 2022-12-28 NOTE — ED Notes (Signed)
EKG changes noted on monitor. New EKG obtained and given to EDP.

## 2022-12-28 NOTE — ED Triage Notes (Addendum)
Pt via POV c/o RUQ abdominal pain x 6 days with vomiting. She states she thinks her gallbladder has ruptured and has been seen at multiple establishments in recent days with no effective treatment recommendations to date. Pt is speaking very rapidly in triage and is asking for a pain pill. Her companion says pt took oxycodone PTA; pt notes it was "a crumb of a pill" and was not the entire tablet. She had something to eat an hour prior to arrival. BP 65/34 auto and manual BP in triage reads 82/48 after recheck. Registration reports that they witnessed a syncopal episode when pt arrived to ER. Charge nurse notified and pt was taken directly to a room.

## 2022-12-28 NOTE — ED Notes (Signed)
Pt unable to give ua at this time.

## 2023-04-07 ENCOUNTER — Encounter (HOSPITAL_COMMUNITY): Payer: Self-pay

## 2023-04-07 ENCOUNTER — Emergency Department (HOSPITAL_COMMUNITY)
Admission: EM | Admit: 2023-04-07 | Discharge: 2023-04-07 | Disposition: A | Discharge status: Home / Self Care | Payer: Commercial Managed Care - HMO | Attending: Emergency Medicine | Admitting: Emergency Medicine

## 2023-04-07 ENCOUNTER — Other Ambulatory Visit: Payer: Self-pay

## 2023-04-07 ENCOUNTER — Emergency Department (HOSPITAL_COMMUNITY): Payer: Commercial Managed Care - HMO

## 2023-04-07 DIAGNOSIS — R000 Tachycardia, unspecified: Secondary | ICD-10-CM | POA: Insufficient documentation

## 2023-04-07 DIAGNOSIS — N12 Tubulo-interstitial nephritis, not specified as acute or chronic: Secondary | ICD-10-CM | POA: Insufficient documentation

## 2023-04-07 DIAGNOSIS — R300 Dysuria: Secondary | ICD-10-CM | POA: Diagnosis present

## 2023-04-07 LAB — URINALYSIS, W/ REFLEX TO CULTURE (INFECTION SUSPECTED)
Bilirubin Urine: NEGATIVE
Glucose, UA: NEGATIVE mg/dL
Ketones, ur: NEGATIVE mg/dL
Nitrite: POSITIVE — AB
Protein, ur: 100 mg/dL — AB
RBC / HPF: >50 RBC/hpf (ref 0–5)
Specific Gravity, Urine: 1.025 (ref 1.005–1.030)
WBC, UA: >50 WBC/hpf (ref 0–5)
pH: 5 (ref 5.0–8.0)

## 2023-04-07 LAB — COMPREHENSIVE METABOLIC PANEL
ALT: 18 U/L (ref 0–44)
AST: 11 U/L — ABNORMAL LOW (ref 15–41)
Albumin: 3.3 g/dL — ABNORMAL LOW (ref 3.5–5.0)
Alkaline Phosphatase: 49 U/L (ref 38–126)
Anion gap: 11 (ref 5–15)
BUN: 10 mg/dL (ref 6–20)
CO2: 24 mmol/L (ref 22–32)
Calcium: 8.2 mg/dL — ABNORMAL LOW (ref 8.9–10.3)
Chloride: 101 mmol/L (ref 98–111)
Creatinine, Ser: 0.77 mg/dL (ref 0.44–1.00)
GFR, Estimated: >60 mL/min (ref >60)
Glucose, Bld: 110 mg/dL — ABNORMAL HIGH (ref 70–99)
Potassium: 3.5 mmol/L (ref 3.5–5.1)
Sodium: 136 mmol/L (ref 135–145)
Total Bilirubin: 0.4 mg/dL (ref 0.3–1.2)
Total Protein: 6.8 g/dL (ref 6.5–8.1)

## 2023-04-07 LAB — CBC WITH DIFFERENTIAL/PLATELET
Abs Immature Granulocytes: 0.02 10*3/uL (ref 0.00–0.07)
Basophils Absolute: 0 10*3/uL (ref 0.0–0.1)
Basophils Relative: 0 %
Eosinophils Absolute: 0 10*3/uL (ref 0.0–0.5)
Eosinophils Relative: 0 %
HCT: 40.1 % (ref 36.0–46.0)
Hemoglobin: 12.6 g/dL (ref 12.0–15.0)
Immature Granulocytes: 0 %
Lymphocytes Relative: 19 %
Lymphs Abs: 1.3 10*3/uL (ref 0.7–4.0)
MCH: 28.4 pg (ref 26.0–34.0)
MCHC: 31.4 g/dL (ref 30.0–36.0)
MCV: 90.3 fL (ref 80.0–100.0)
Monocytes Absolute: 0.5 10*3/uL (ref 0.1–1.0)
Monocytes Relative: 7 %
Neutro Abs: 5 10*3/uL (ref 1.7–7.7)
Neutrophils Relative %: 74 %
Platelets: 246 10*3/uL (ref 150–400)
RBC: 4.44 MIL/uL (ref 3.87–5.11)
RDW: 13.1 % (ref 11.5–15.5)
WBC: 6.8 10*3/uL (ref 4.0–10.5)
nRBC: 0 % (ref 0.0–0.2)

## 2023-04-07 LAB — LIPASE, BLOOD: Lipase: 24 U/L (ref 11–51)

## 2023-04-07 LAB — POC URINE PREG, ED: Preg Test, Ur: NEGATIVE

## 2023-04-07 MED ORDER — IOHEXOL 300 MG/ML  SOLN
100.0000 mL | Freq: Once | INTRAMUSCULAR | Status: AC | PRN
  Administered 2023-04-07: 80 mL via INTRAVENOUS

## 2023-04-07 MED ORDER — SODIUM CHLORIDE 0.9 % IV SOLN
1.0000 g | Freq: Once | INTRAVENOUS | Status: AC
  Administered 2023-04-07: 1 g via INTRAVENOUS
  Filled 2023-04-07: qty 10

## 2023-04-07 MED ORDER — SODIUM CHLORIDE 0.9 % IV BOLUS
1000.0000 mL | Freq: Once | INTRAVENOUS | Status: AC
  Administered 2023-04-07: 1000 mL via INTRAVENOUS

## 2023-04-07 MED ORDER — ACETAMINOPHEN 325 MG PO TABS
650.0000 mg | ORAL_TABLET | Freq: Once | ORAL | Status: AC
  Administered 2023-04-07: 650 mg via ORAL
  Filled 2023-04-07: qty 2

## 2023-04-07 MED ORDER — CEFDINIR 300 MG PO CAPS: 300.0000 mg | ORAL_CAPSULE | Freq: Two times a day (BID) | ORAL | 0 refills | Status: AC

## 2023-04-07 NOTE — Discharge Instructions (Addendum)
You appear to have a kidney infection.  We are treating you with 10 days of antibiotics.  Take the full course, even if you start to feel better.  Follow-up closely with your primary care physician and your OB/GYN.  If you develop fever, new or worsening pain, vomiting, or any other new/concerning symptoms then return to the ER or call 911.

## 2023-04-07 NOTE — ED Notes (Signed)
Patient left the department before being discharged - patient had IV in place and unknown if pt self-removed.

## 2023-04-07 NOTE — ED Provider Notes (Signed)
[place] EMERGENCY DEPARTMENT AT [facility] Provider Note   CSN: [id number] Arrival date & time: 04/07/23  1809     History  Chief Complaint  Patient presents with   Abdominal Pain    Meagan [name] is a 32 y.o. female.  HPI 32 year old female presents with dysuria and urinary frequency, back pain and abdominal pain.  She went to urgent care and was instructed to come to the ER for a pelvic exam.  The patient states that for the past couple days she has been having flank pain and dysuria.  She has also noticed a vaginal rash that is painful.  She think she is having a little bit of discharge as well.  She is also been having some low-grade fevers and some bodyaches.  Home Medications Prior to Admission medications   Medication Sig Start Date End Date Taking? Authorizing Provider  cefdinir (OMNICEF) 300 MG capsule Take 1 capsule (300 mg total) by mouth 2 (two) times daily for 10 days. 04/07/23 04/17/23 Yes Pricilla Loveless, MD  ALPRAZolam Prudy Feeler) 0.5 MG tablet 3 times daily as needed for anxiety and/or sleep 10/03/20   Lazaro Arms, MD  clonazePAM (KLONOPIN) 0.5 MG tablet Take 0.5 mg by mouth 2 (two) times daily as needed for anxiety.    [provider]  HYDROcodone-acetaminophen (NORCO/VICODIN) 5-325 MG tablet Take 1 tablet by mouth every 6 (six) hours as needed for moderate pain.    [provider]  naloxone (NARCAN) 0.4 MG/ML injection Place 1 mL (0.4 mg total) into the nose as needed (decreased consciousness or breathing after taking narcotics). 08/07/22   Mesner, Barbara Cower, MD  norethindrone (MICRONOR) 0.35 MG tablet TAKE 1 TABLET BY MOUTH ONCE A DAY. Patient not taking: Reported on 12/25/2022 06/28/22   Cresenzo-Dishmon, Scarlette Calico, CNM  ondansetron (ZOFRAN) 4 MG tablet Take 1 tablet (4 mg total) by mouth every 8 (eight) hours as needed for nausea or vomiting. 12/28/22   Terrilee Files, MD      Allergies    Codeine, Penicillins,  Sulfamethoxazole-trimethoprim, Misc. sulfonamide containing compounds, and Penicillin g    Review of Systems   Review of Systems  Constitutional:  Positive for chills and fever.  Gastrointestinal:  Positive for abdominal pain.  Genitourinary:  Positive for dysuria, flank pain, vaginal discharge and vaginal pain.    Physical Exam Updated Vital Signs BP 113/75 (BP Location: Right Arm)   Pulse (!) 116   Temp 98.8 F (37.1 C)   Resp 17   Ht 5\' 2"  (1.575 m)   Wt 54.4 kg   SpO2 97%   BMI 21.95 kg/m  Physical Exam Vitals and nursing note reviewed.  Constitutional:      Appearance: She is well-developed.  HENT:     Head: Normocephalic and atraumatic.  Cardiovascular:     Rate and Rhythm: Regular rhythm. Tachycardia present.     Heart sounds: Normal heart sounds.     Comments: HR low 100s Pulmonary:     Effort: Pulmonary effort is normal.     Breath sounds: Normal breath sounds.  Abdominal:     Palpations: Abdomen is soft.     Tenderness: There is abdominal tenderness in the left lower quadrant. There is right CVA tenderness (mild) and left CVA tenderness (mild).  Genitourinary:    Comments: Patient declines pelvic exam Skin:    General: Skin is warm and dry.  Neurological:     Mental Status: She is alert.     ED Results /  Procedures / Treatments   Labs (all labs ordered are listed, but only abnormal results are displayed) Labs Reviewed  COMPREHENSIVE METABOLIC PANEL - Abnormal; Notable for the following components:      Result Value   Glucose, Bld 110 (*)    Calcium 8.2 (*)    Albumin 3.3 (*)    AST 11 (*)    All other components within normal limits  URINALYSIS, W/ REFLEX TO CULTURE (INFECTION SUSPECTED) - Abnormal; Notable for the following components:   APPearance CLOUDY (*)    Hgb urine dipstick LARGE (*)    Protein, ur 100 (*)    Nitrite POSITIVE (*)    Leukocytes,Ua LARGE (*)    Bacteria, UA FEW (*)    All other components within normal limits  LIPASE,  BLOOD  CBC WITH DIFFERENTIAL/PLATELET  POC URINE PREG, ED    EKG None  Radiology CT ABDOMEN PELVIS W CONTRAST  Result Date: 04/07/2023 CLINICAL DATA:  Left lower quadrant abdominal pain. Upper abdominal pain for 2 days accompanied with urinary frequency and burning. Frequent belching and feeling bloated. Tender to palpation. Last bowel movement today. EXAM: CT ABDOMEN AND PELVIS WITH CONTRAST TECHNIQUE: Multidetector CT imaging of the abdomen and pelvis was performed using the standard protocol following bolus administration of intravenous contrast. RADIATION DOSE REDUCTION: This exam was performed according to the departmental dose-optimization program which includes automated exposure control, adjustment of the mA and/or kV according to patient size and/or use of iterative reconstruction technique. CONTRAST:  80mL OMNIPAQUE IOHEXOL 300 MG/ML  SOLN COMPARISON:  CT abdomen and pelvis 12/28/2022 FINDINGS: Lower chest: No acute abnormality. Hepatobiliary: Unremarkable. Pancreas: Unremarkable. Spleen: Unremarkable. Adrenals/Urinary Tract: Bladder wall thickening may be due to underdistention or cystitis. No urinary calculi or hydronephrosis. Stable adrenal glands. Stomach/Bowel: Normal caliber large and small bowel. Engorgement of the vasa recta with wall thickening and hazy stranding/fluid about loops of small bowel in the central abdomen (for example on series 5/image 30). Normal appendix. Stomach is within normal limits. Vascular/Lymphatic: No significant vascular findings are present. No enlarged abdominal or pelvic lymph nodes. Reproductive: Physiologic appearance of the uterus and ovaries. Other: Small volume free fluid in the central mesentery, pelvis, and right pericolic gutter. No free intraperitoneal air. Musculoskeletal: No acute fracture. IMPRESSION: 1. Findings are suggestive of enteritis, likely infectious/inflammatory. No evidence of obstruction. 2. Bladder wall thickening may be due to  underdistention or cystitis. Correlate with urinalysis. Electronically Signed   By: Minerva Fester M.D.   On: 04/07/2023 22:44    Procedures Procedures    Medications Ordered in ED Medications  acetaminophen (TYLENOL) tablet 650 mg (650 mg Oral Given 04/07/23 2029)  sodium chloride 0.9 % bolus 1,000 mL (0 mLs Intravenous Stopped 04/07/23 2324)  cefTRIAXone (ROCEPHIN) 1 g in sodium chloride 0.9 % 100 mL IVPB (0 g Intravenous Stopped 04/07/23 2324)  iohexol (OMNIPAQUE) 300 MG/ML solution 100 mL (80 mLs Intravenous Contrast Given 04/07/23 2140)    ED Course/ Medical Decision Making/ A&P                                 Medical Decision Making Amount and/or Complexity of Data Reviewed Labs: ordered.    Details: Normal WBC.  UA consistent with UTI Radiology: ordered and independent interpretation performed.    Details: No ureteral stone or abscess  Risk OTC drugs. Prescription drug management.   Patient is well-appearing.  Her heart rate in triage was  116 but closer to 100 on my exam.  Presentation is most consistent with pyelonephritis.  Given a fluid bolus and Rocephin.    Patient declined pelvic exam.  I discussed I cannot rule in or rule out herpes or other acute STI without a pelvic exam, especially with her complaining of a vaginal rash.  She understands this but still does not want to do an exam.  She would like to follow-up with her OB/GYN.  Abdominal CT obtained given the left lower abdominal pain.  No signs of an obvious emergent condition.  I discussed that we can treat this as an outpatient given her otherwise well appearance.  I went to the ICU as I was called for an emergent procedure and patient left prior to getting her heart rate officially rechecked and did not receive her paperwork.       Final Clinical Impression(s) / ED Diagnoses Final diagnoses:  Pyelonephritis    Rx / DC Orders ED Discharge Orders          Ordered    cefdinir (OMNICEF) 300 MG capsule  2  times daily        04/07/23 2248              Pricilla Loveless, MD 04/07/23 2345

## 2023-04-07 NOTE — ED Notes (Signed)
Pt states she can't provide urine sample at this time; will check back.

## 2023-04-07 NOTE — ED Triage Notes (Signed)
C/o upper abd pain x2 days accompanied with urinary frequency/burning, frequent belching and bloated.  Tender with palpitation. LMB today   Denies n/v/d

## 2023-05-02 ENCOUNTER — Telehealth: Payer: Medicaid Other

## 2023-12-19 ENCOUNTER — Other Ambulatory Visit: Payer: Self-pay

## 2023-12-19 ENCOUNTER — Emergency Department (HOSPITAL_COMMUNITY)
Admission: EM | Admit: 2023-12-19 | Discharge: 2023-12-19 | Disposition: A | Discharge status: Home / Self Care | Attending: Emergency Medicine | Admitting: Emergency Medicine

## 2023-12-19 ENCOUNTER — Encounter (HOSPITAL_COMMUNITY): Payer: Self-pay

## 2023-12-19 ENCOUNTER — Emergency Department (HOSPITAL_COMMUNITY)

## 2023-12-19 DIAGNOSIS — Z0000 Encounter for general adult medical examination without abnormal findings: Secondary | ICD-10-CM | POA: Insufficient documentation

## 2023-12-19 NOTE — ED Triage Notes (Addendum)
 Pt was arrested tonight and was scanned and scan showed something in her pelvic area.

## 2023-12-19 NOTE — ED Provider Notes (Signed)
 [place] EMERGENCY DEPARTMENT AT [facility] Provider Note   CSN: [id number] Arrival date & time: 12/19/23  4098     Patient presents with: No chief complaint on file.   Meagan [name] is a 33 y.o. female.   Here for possible foreign body in her perineal area [facility]. Patient denies. No other symptoms. Wouldn't be here if not in custody.         Prior to Admission medications   Medication Sig Start Date End Date Taking? Authorizing Provider  ALPRAZolam  (XANAX ) 0.5 MG tablet 3 times daily as needed for anxiety and/or sleep 10/03/20   Wendelyn Halter, MD  clonazePAM (KLONOPIN) 0.5 MG tablet Take 0.5 mg by mouth 2 (two) times daily as needed for anxiety.    [provider]  HYDROcodone -acetaminophen  (NORCO/VICODIN) 5-325 MG tablet Take 1 tablet by mouth every 6 (six) hours as needed for moderate pain.    [provider]  naloxone  (NARCAN ) 0.4 MG/ML injection Place 1 mL (0.4 mg total) into the nose as needed (decreased consciousness or breathing after taking narcotics). 08/07/22   Quanasia Defino, Reymundo Caulk, MD  ondansetron  (ZOFRAN ) 4 MG tablet Take 1 tablet (4 mg total) by mouth every 8 (eight) hours as needed for nausea or vomiting. 12/28/22   Tonya Fredrickson, MD    Allergies: Codeine , Penicillins, Sulfamethoxazole -trimethoprim , Misc. sulfonamide containing compounds, and Penicillin g    Review of Systems  Updated Vital Signs BP 90/68   Pulse 62   Resp 16   SpO2 99%   Physical Exam Vitals and nursing note reviewed.  Constitutional:      Appearance: She is well-developed.  HENT:     Head: Normocephalic and atraumatic.   Cardiovascular:     Rate and Rhythm: Normal rate and regular rhythm.  Pulmonary:     Effort: No respiratory distress.     Breath sounds: No stridor.  Abdominal:     General: There is no distension.     Tenderness: There is no abdominal tenderness. There is no guarding or rebound.   Musculoskeletal:      Cervical back: Normal range of motion.   Neurological:     Mental Status: She is alert.     (all labs ordered are listed, but only abnormal results are displayed) Labs Reviewed - No data to display  EKG: None  Radiology: DG Abdomen 1 View Result Date: 12/19/2023 EXAM: 1 VIEW XRAY OF THE ABDOMEN SUPINE 12/19/2023 03:01:00 AM COMPARISON: None available. CLINICAL HISTORY: Eval for foreign body. FINDINGS: BOWEL: Nonobstructive bowel gas pattern. PERITONEUM AND SOFT TISSUES: No abnormal calcifications. No radiopaque foreign body. BONES: No acute osseous abnormality. IMPRESSION: 1. No radiopaque foreign body. Electronically signed by: Zadie Herter MD 12/19/2023 03:05 AM EDT RP Workstation: [id number]   DG Pelvis 1-2 Views Result Date: 12/19/2023 EXAM: 1 VIEW(S) XRAY OF THE PELVIS 12/19/2023 03:01:00 AM COMPARISON: None available. CLINICAL HISTORY: Eval for foreign body. FINDINGS: BONES AND JOINTS: No acute fracture. No focal osseous lesion. No joint dislocation. SOFT TISSUES: The soft tissues are unremarkable. No radiopaque foreign body. IMPRESSION: 1. No radiopaque foreign body. Electronically signed by: Zadie Herter MD 12/19/2023 03:05 AM EDT RP Workstation: [id number]     Procedures   Medications Ordered in the ED - No data to display  Medical Decision Making Amount and/or Complexity of Data Reviewed Radiology: ordered.   No foreign body on my or radiologist interpretation of the xrays. Patient denies. Appears intoxicated however no other needs identified.      Final diagnoses:  Regular check-up    ED Discharge Orders     None          Maxon Kresse, Reymundo Caulk, MD 12/19/23 [phone]

## 2023-12-19 NOTE — Discharge Instructions (Signed)
 She denies putting anything in any of her orifices and the xrays here do not show any foreign bodies.
